# Patient Record
Sex: Female | Born: 1955 | Race: Black or African American | Hispanic: No | Smoking: Current every day smoker
Health system: Southern US, Community
[De-identification: ages and names within clinical notes are randomized; demographics above are authoritative.]

## PROBLEM LIST (undated history)

## (undated) DIAGNOSIS — R0609 Other forms of dyspnea: Secondary | ICD-10-CM

## (undated) DIAGNOSIS — H04129 Dry eye syndrome of unspecified lacrimal gland: Secondary | ICD-10-CM

## (undated) DIAGNOSIS — N183 Chronic kidney disease, stage 3 unspecified: Secondary | ICD-10-CM

## (undated) DIAGNOSIS — Z87448 Personal history of other diseases of urinary system: Secondary | ICD-10-CM

## (undated) DIAGNOSIS — F419 Anxiety disorder, unspecified: Secondary | ICD-10-CM

## (undated) DIAGNOSIS — M1990 Unspecified osteoarthritis, unspecified site: Secondary | ICD-10-CM

## (undated) DIAGNOSIS — Z972 Presence of dental prosthetic device (complete) (partial): Secondary | ICD-10-CM

## (undated) DIAGNOSIS — Z87440 Personal history of urinary (tract) infections: Secondary | ICD-10-CM

## (undated) DIAGNOSIS — F329 Major depressive disorder, single episode, unspecified: Secondary | ICD-10-CM

## (undated) DIAGNOSIS — N201 Calculus of ureter: Secondary | ICD-10-CM

## (undated) DIAGNOSIS — I10 Essential (primary) hypertension: Secondary | ICD-10-CM

## (undated) DIAGNOSIS — R3915 Urgency of urination: Secondary | ICD-10-CM

## (undated) DIAGNOSIS — Z8659 Personal history of other mental and behavioral disorders: Secondary | ICD-10-CM

## (undated) DIAGNOSIS — R0600 Dyspnea, unspecified: Secondary | ICD-10-CM

## (undated) DIAGNOSIS — H8110 Benign paroxysmal vertigo, unspecified ear: Secondary | ICD-10-CM

## (undated) DIAGNOSIS — K219 Gastro-esophageal reflux disease without esophagitis: Secondary | ICD-10-CM

## (undated) DIAGNOSIS — R319 Hematuria, unspecified: Secondary | ICD-10-CM

## (undated) DIAGNOSIS — G43909 Migraine, unspecified, not intractable, without status migrainosus: Secondary | ICD-10-CM

## (undated) DIAGNOSIS — Z87898 Personal history of other specified conditions: Secondary | ICD-10-CM

## (undated) DIAGNOSIS — L409 Psoriasis, unspecified: Secondary | ICD-10-CM

## (undated) DIAGNOSIS — R195 Other fecal abnormalities: Secondary | ICD-10-CM

## (undated) DIAGNOSIS — Z8669 Personal history of other diseases of the nervous system and sense organs: Secondary | ICD-10-CM

## (undated) DIAGNOSIS — N200 Calculus of kidney: Secondary | ICD-10-CM

## (undated) DIAGNOSIS — F32A Depression, unspecified: Secondary | ICD-10-CM

## (undated) DIAGNOSIS — J45909 Unspecified asthma, uncomplicated: Secondary | ICD-10-CM

---

## 1984-05-03 HISTORY — PX: EXPLORATORY LAPAROTOMY: SUR591

## 1997-10-21 ENCOUNTER — Emergency Department (HOSPITAL_COMMUNITY): Admission: EM | Admit: 1997-10-21 | Discharge: 1997-10-21 | Payer: Self-pay | Admitting: Internal Medicine

## 1999-11-02 ENCOUNTER — Encounter: Payer: Self-pay | Admitting: Cardiology

## 1999-11-02 ENCOUNTER — Ambulatory Visit (HOSPITAL_COMMUNITY): Admission: RE | Admit: 1999-11-02 | Discharge: 1999-11-02 | Payer: Self-pay | Admitting: Cardiology

## 1999-12-06 ENCOUNTER — Encounter: Payer: Self-pay | Admitting: Emergency Medicine

## 1999-12-06 ENCOUNTER — Emergency Department (HOSPITAL_COMMUNITY): Admission: EM | Admit: 1999-12-06 | Discharge: 1999-12-06 | Payer: Self-pay | Admitting: Emergency Medicine

## 1999-12-10 ENCOUNTER — Encounter: Payer: Self-pay | Admitting: Specialist

## 1999-12-10 ENCOUNTER — Ambulatory Visit (HOSPITAL_COMMUNITY): Admission: RE | Admit: 1999-12-10 | Discharge: 1999-12-10 | Payer: Self-pay | Admitting: Specialist

## 2000-05-17 ENCOUNTER — Emergency Department (HOSPITAL_COMMUNITY): Admission: EM | Admit: 2000-05-17 | Discharge: 2000-05-17 | Payer: Self-pay | Admitting: Emergency Medicine

## 2000-05-17 ENCOUNTER — Encounter: Payer: Self-pay | Admitting: Emergency Medicine

## 2000-06-23 ENCOUNTER — Ambulatory Visit (HOSPITAL_COMMUNITY): Admission: RE | Admit: 2000-06-23 | Discharge: 2000-06-23 | Payer: Self-pay | Admitting: *Deleted

## 2000-06-23 ENCOUNTER — Encounter: Payer: Self-pay | Admitting: *Deleted

## 2002-01-22 ENCOUNTER — Ambulatory Visit (HOSPITAL_BASED_OUTPATIENT_CLINIC_OR_DEPARTMENT_OTHER): Admission: RE | Admit: 2002-01-22 | Discharge: 2002-01-22 | Payer: Self-pay | Admitting: Specialist

## 2002-01-22 ENCOUNTER — Encounter (INDEPENDENT_AMBULATORY_CARE_PROVIDER_SITE_OTHER): Payer: Self-pay | Admitting: Specialist

## 2002-01-22 HISTORY — PX: OTHER SURGICAL HISTORY: SHX169

## 2002-02-19 ENCOUNTER — Emergency Department (HOSPITAL_COMMUNITY): Admission: EM | Admit: 2002-02-19 | Discharge: 2002-02-19 | Payer: Self-pay | Admitting: *Deleted

## 2002-11-11 ENCOUNTER — Emergency Department (HOSPITAL_COMMUNITY): Admission: EM | Admit: 2002-11-11 | Discharge: 2002-11-11 | Payer: Self-pay | Admitting: Emergency Medicine

## 2002-11-17 ENCOUNTER — Encounter: Payer: Self-pay | Admitting: Cardiology

## 2002-11-17 ENCOUNTER — Ambulatory Visit (HOSPITAL_COMMUNITY): Admission: RE | Admit: 2002-11-17 | Discharge: 2002-11-17 | Payer: Self-pay | Admitting: Cardiology

## 2002-11-27 ENCOUNTER — Ambulatory Visit (HOSPITAL_COMMUNITY): Admission: RE | Admit: 2002-11-27 | Discharge: 2002-11-27 | Payer: Self-pay | Admitting: Obstetrics

## 2002-11-27 ENCOUNTER — Encounter: Payer: Self-pay | Admitting: Obstetrics

## 2003-06-08 ENCOUNTER — Emergency Department (HOSPITAL_COMMUNITY): Admission: EM | Admit: 2003-06-08 | Discharge: 2003-06-08 | Payer: Self-pay | Admitting: Emergency Medicine

## 2004-09-17 ENCOUNTER — Emergency Department (HOSPITAL_COMMUNITY): Admission: EM | Admit: 2004-09-17 | Discharge: 2004-09-17 | Payer: Self-pay | Admitting: Emergency Medicine

## 2005-08-10 ENCOUNTER — Ambulatory Visit (HOSPITAL_BASED_OUTPATIENT_CLINIC_OR_DEPARTMENT_OTHER): Admission: RE | Admit: 2005-08-10 | Discharge: 2005-08-10 | Payer: Self-pay | Admitting: Cardiology

## 2005-08-10 HISTORY — PX: OTHER SURGICAL HISTORY: SHX169

## 2005-08-12 ENCOUNTER — Encounter: Admission: RE | Admit: 2005-08-12 | Discharge: 2005-08-12 | Payer: Self-pay | Admitting: Cardiology

## 2005-08-15 ENCOUNTER — Ambulatory Visit: Payer: Self-pay | Admitting: Internal Medicine

## 2005-09-13 ENCOUNTER — Encounter: Admission: RE | Admit: 2005-09-13 | Discharge: 2005-09-13 | Payer: Self-pay | Admitting: Cardiology

## 2006-11-15 ENCOUNTER — Encounter: Admission: RE | Admit: 2006-11-15 | Discharge: 2006-11-15 | Payer: Self-pay | Admitting: Cardiology

## 2007-07-25 ENCOUNTER — Emergency Department (HOSPITAL_COMMUNITY): Admission: EM | Admit: 2007-07-25 | Discharge: 2007-07-25 | Payer: Self-pay | Admitting: Emergency Medicine

## 2007-12-27 ENCOUNTER — Emergency Department (HOSPITAL_COMMUNITY): Admission: EM | Admit: 2007-12-27 | Discharge: 2007-12-27 | Payer: Self-pay | Admitting: Emergency Medicine

## 2008-01-26 ENCOUNTER — Ambulatory Visit (HOSPITAL_COMMUNITY): Admission: RE | Admit: 2008-01-26 | Discharge: 2008-01-26 | Payer: Self-pay | Admitting: Cardiology

## 2008-02-07 ENCOUNTER — Ambulatory Visit (HOSPITAL_COMMUNITY): Admission: RE | Admit: 2008-02-07 | Discharge: 2008-02-07 | Payer: Self-pay | Admitting: Cardiology

## 2008-08-30 ENCOUNTER — Emergency Department (HOSPITAL_COMMUNITY): Admission: EM | Admit: 2008-08-30 | Discharge: 2008-08-30 | Payer: Self-pay | Admitting: Emergency Medicine

## 2008-11-16 ENCOUNTER — Emergency Department (HOSPITAL_COMMUNITY): Admission: EM | Admit: 2008-11-16 | Discharge: 2008-11-16 | Payer: Self-pay | Admitting: Internal Medicine

## 2008-11-19 ENCOUNTER — Emergency Department (HOSPITAL_COMMUNITY): Admission: EM | Admit: 2008-11-19 | Discharge: 2008-11-19 | Payer: Self-pay | Admitting: Family Medicine

## 2009-05-20 ENCOUNTER — Emergency Department (HOSPITAL_COMMUNITY): Admission: EM | Admit: 2009-05-20 | Discharge: 2009-05-20 | Payer: Self-pay | Admitting: Emergency Medicine

## 2009-12-01 ENCOUNTER — Encounter: Admission: RE | Admit: 2009-12-01 | Discharge: 2009-12-01 | Payer: Self-pay | Admitting: Cardiology

## 2010-01-02 ENCOUNTER — Emergency Department (HOSPITAL_COMMUNITY): Admission: EM | Admit: 2010-01-02 | Discharge: 2010-01-02 | Payer: Self-pay | Admitting: Emergency Medicine

## 2010-05-24 ENCOUNTER — Encounter: Payer: Self-pay | Admitting: Cardiology

## 2010-05-24 ENCOUNTER — Encounter: Payer: Self-pay | Admitting: Obstetrics

## 2010-05-25 ENCOUNTER — Encounter: Payer: Self-pay | Admitting: Cardiology

## 2010-07-01 ENCOUNTER — Emergency Department (HOSPITAL_COMMUNITY)
Admission: EM | Admit: 2010-07-01 | Discharge: 2010-07-01 | Disposition: A | Discharge status: Home / Self Care | Payer: Medicare Other | Attending: Emergency Medicine | Admitting: Emergency Medicine

## 2010-07-01 DIAGNOSIS — L03317 Cellulitis of buttock: Secondary | ICD-10-CM | POA: Insufficient documentation

## 2010-07-01 DIAGNOSIS — L0231 Cutaneous abscess of buttock: Secondary | ICD-10-CM | POA: Insufficient documentation

## 2010-07-01 DIAGNOSIS — Z79899 Other long term (current) drug therapy: Secondary | ICD-10-CM | POA: Insufficient documentation

## 2010-07-01 HISTORY — PX: OTHER SURGICAL HISTORY: SHX169

## 2010-07-15 NOTE — Op Note (Signed)
  NAME:  Whitney Parrish, Whitney Parrish                ACCOUNT NO.:  [id number]  MEDICAL RECORD NO.:  [id number]           PATIENT TYPE:  E  LOCATION:  [facility]                         FACILITY:  [facility]  PHYSICIAN:  Angelia Mould. Derrell Lolling, M.D.DATE OF BIRTH:  04/14/1956  DATE OF PROCEDURE: DATE OF DISCHARGE:  07/01/2010                              OPERATIVE REPORT   PREOPERATIVE DIAGNOSIS:  Left buttock abscess x2.  POSTOPERATIVE DIAGNOSIS:  Left buttock abscess x2.  PROCEDURE:  Simple incision and drainage of left buttocks abscess x2.  SURGEON:  Brayton El, PA-C/Jenkins Risdon M. Derrell Lolling, MD  ANESTHESIA:  Local.  DESCRIPTION OF PROCEDURE:  After informed consent was adequate, the patient was prepped and draped in sterile manner lying in a prone position.  Xylocaine 2% without epinephrine was infiltrated into the abscess area until adequate anesthesia was achieved.  Then a #11 blade was used to make two small 1-cm incisions into the abscess cavities to allow drainage of purulent material.  Compression was used to aid in decompression of the abscess cavities.  Sterile hemostat was used to break up any septations.  The wounds were then packed with quarter-inch Iodoform gauze.  The patient tolerated the procedure well.  Dry sterile dressing was placed over the wounds.  The patient was given discharge instructions.     Brayton El, PA-C   ______________________________ Angelia Mould. Derrell Lolling, M.D.    KB/MEDQ  D:  07/01/2010  T:  07/02/2010  Job:  [id number]  Electronically Signed by Brayton El  on 07/14/2010 11:00:58 AM Electronically Signed by Claud Kelp M.D. on 07/15/2010 09:24:29 AM

## 2010-07-15 NOTE — Consult Note (Signed)
NAME:  Whitney Parrish, Whitney Parrish                ACCOUNT NO.:  [id number]  MEDICAL RECORD NO.:  [id number]           PATIENT TYPE:  E  LOCATION:  [facility]                         FACILITY:  [facility]  PHYSICIAN:  Angelia Mould. Derrell Lolling, M.D.DATE OF BIRTH:  04/14/1956  DATE OF CONSULTATION:  07/01/2010 DATE OF DISCHARGE:  07/01/2010                                CONSULTATION   REQUESTING PHYSICIAN:  Doug Sou, MD at the Emergency Department.  CHIEF COMPLAINT:  Boils on buttocks.  HISTORY OF PRESENT ILLNESS:  Whitney Parrish is a pleasant 55 year old African American female, who complains of painful boils on her bilateral buttocks for several days.  It has gotten to the point where she has difficulty sitting because of the discomfort and therefore came to the emergency department for evaluation.  She denies any fever.  She denies any trauma.  She does have a history of psoriasis on multiple medications including steroid for this.  She does not use any injectables to her buttocks areas.  We have been asked to evaluate the patient for incision and drainage.  PAST MEDICAL HISTORY:  Psoriasis, irritable bowel syndrome, migraines and vertigo.  FAMILY HISTORY:  Noncontributory at present case.  SOCIAL HISTORY:  The patient does smoke cigarettes.  She denies any tobacco or illicit drug use.  She is currently on disability for some type of bowel disorder.  DRUG ALLERGIES:  SULFA.  MEDICATIONS:  Meclizine, halobetasol, meloxicam and, clobetasol.  REVIEW OF SYSTEMS:  Please see history of present illness, otherwise complete 12 system review found negative.  PHYSICAL EXAMINATION:  VITAL SIGNS:  Temperature of 98.2, heart rate of 85, respiratory rate of 20, blood pressure 113/63, oxygen saturation 100% on room air. GENERAL APPEARANCE:  A 55 year old [place] American female, in no acute distress. HEENT:  Unremarkable. NECK:  Supple. LUNGS:  Clear to auscultation.  No wheezes, rhonchi, or rales. HEART:   Regular rate and rhythm.  No murmurs, gallops or rubs. ABDOMEN:  Soft, flat, benign and nontender.  No mass effect or hernias. SKIN:  Her buttocks show multiple small punctate areas that are several indurated areas that appear to be nearly coming to a head, but there is no fluctuance.  These are tender and mildly erythematous.  There is one lesion particularly in the right lower, right inferior buttocks area that is particularly tender, but again no fluctuance.  Along, the medial aspect of the left buttock, the larger area of duration with some skin excoriation and active drainage.  There are 2 separate fluctuations with overlying erythema and exquisite tenderness.  DIAGNOSTICS:  No labs or imaging is performed.  IMPRESSION:  Multiple buttock abscesses.  PLAN:  With recommendation and agreement of the patient, we recommend incision and drainage.  This was carried out.  Please see separate procedure note dictated.  The patient will be placed on doxycycline 100 mg twice daily for 2 weeks.  She is given a prescription for Percocet 1- 2 tablets q.6 h. p.r.n. pain.  She is encouraged to use warm soaks and warm compresses in approximately 24 hours.  She could sit and soak in the tub and allow the  packing strips to come out.  She needs to maintain a dry dressing over this area and otherwise use antimicrobial soap, expect the antibiotics to allow the additional small lesions to heal without need for incision and drainage.  She can certainly contact our office without any other questions or concerns if these areas are not improving over the next 7-10 days.     Brayton El, PA-C   ______________________________ Angelia Mould. Derrell Lolling, M.D.    KB/MEDQ  D:  07/01/2010  T:  07/02/2010  Job:  [id number]  Electronically Signed by Brayton El  on 07/14/2010 11:01:03 AM Electronically Signed by Claud Kelp M.D. on 07/15/2010 09:25:41 AM

## 2010-08-12 LAB — CBC
HCT: 36.7 % (ref 36.0–46.0)
Hemoglobin: 12.7 g/dL (ref 12.0–15.0)
MCHC: 34.7 g/dL (ref 30.0–36.0)
MCV: 85.2 fL (ref 78.0–100.0)
Platelets: 278 10*3/uL (ref 150–400)
RBC: 4.3 MIL/uL (ref 3.87–5.11)
RDW: 13.2 % (ref 11.5–15.5)
WBC: 14.7 10*3/uL — ABNORMAL HIGH (ref 4.0–10.5)

## 2010-08-12 LAB — DIFFERENTIAL
Basophils Absolute: 0.1 10*3/uL (ref 0.0–0.1)
Basophils Relative: 1 % (ref 0–1)
Eosinophils Absolute: 0.1 10*3/uL (ref 0.0–0.7)
Eosinophils Relative: 0 % (ref 0–5)
Lymphocytes Relative: 13 % (ref 12–46)
Lymphs Abs: 1.9 10*3/uL (ref 0.7–4.0)
Monocytes Absolute: 0.5 10*3/uL (ref 0.1–1.0)
Monocytes Relative: 3 % (ref 3–12)
Neutro Abs: 12.2 10*3/uL — ABNORMAL HIGH (ref 1.7–7.7)
Neutrophils Relative %: 83 % — ABNORMAL HIGH (ref 43–77)

## 2010-08-12 LAB — COMPREHENSIVE METABOLIC PANEL
ALT: 15 U/L (ref 0–35)
AST: 40 U/L — ABNORMAL HIGH (ref 0–37)
Albumin: 3.8 g/dL (ref 3.5–5.2)
Alkaline Phosphatase: 100 U/L (ref 39–117)
BUN: 12 mg/dL (ref 6–23)
CO2: 18 mEq/L — ABNORMAL LOW (ref 19–32)
Calcium: 9.1 mg/dL (ref 8.4–10.5)
Chloride: 111 mEq/L (ref 96–112)
Creatinine, Ser: 2.18 mg/dL — ABNORMAL HIGH (ref 0.4–1.2)
GFR calc Af Amer: 29 mL/min — ABNORMAL LOW (ref >60)
GFR calc non Af Amer: 24 mL/min — ABNORMAL LOW (ref >60)
Glucose, Bld: 112 mg/dL — ABNORMAL HIGH (ref 70–99)
Potassium: 5.6 mEq/L — ABNORMAL HIGH (ref 3.5–5.1)
Sodium: 139 mEq/L (ref 135–145)
Total Bilirubin: 1.4 mg/dL — ABNORMAL HIGH (ref 0.3–1.2)
Total Protein: 7.5 g/dL (ref 6.0–8.3)

## 2010-08-12 LAB — URINE CULTURE: Colony Count: 15000

## 2010-08-12 LAB — URINALYSIS, ROUTINE W REFLEX MICROSCOPIC
Bilirubin Urine: NEGATIVE
Glucose, UA: NEGATIVE mg/dL
Ketones, ur: NEGATIVE mg/dL
Nitrite: NEGATIVE
Protein, ur: 30 mg/dL — AB
Specific Gravity, Urine: 1.012 (ref 1.005–1.030)
Urobilinogen, UA: 0.2 mg/dL (ref 0.0–1.0)
pH: 6 (ref 5.0–8.0)

## 2010-08-12 LAB — POCT I-STAT, CHEM 8
BUN: 16 mg/dL (ref 6–23)
Calcium, Ion: 1.14 mmol/L (ref 1.12–1.32)
Chloride: 116 mEq/L — ABNORMAL HIGH (ref 96–112)
Creatinine, Ser: 2.4 mg/dL — ABNORMAL HIGH (ref 0.4–1.2)
Glucose, Bld: 100 mg/dL — ABNORMAL HIGH (ref 70–99)
HCT: 41 % (ref 36.0–46.0)
Hemoglobin: 13.9 g/dL (ref 12.0–15.0)
Potassium: 5.1 mEq/L (ref 3.5–5.1)
Sodium: 138 mEq/L (ref 135–145)
TCO2: 18 mmol/L (ref 0–100)

## 2010-08-12 LAB — URINE MICROSCOPIC-ADD ON

## 2010-08-12 LAB — LIPASE, BLOOD: Lipase: 50 U/L (ref 11–59)

## 2010-09-18 NOTE — Op Note (Signed)
   NAME:  Whitney Parrish, Whitney Parrish                          ACCOUNT NO.:  [id number]   MEDICAL RECORD NO.:  [id number]                   PATIENT TYPE:  AMB   LOCATION:  [facility]                                  FACILITY:  [facility]   PHYSICIAN:  Earvin Hansen L. Shon Hough, M.D.           DATE OF BIRTH:  05-Feb-1956   DATE OF PROCEDURE:  DATE OF DISCHARGE:                                 OPERATIVE REPORT   INDICATIONS:  This is a 55 year old lady with severe abdominal deformity  secondary to previous abdominal exploratory surgery and removal of tumor.  The patient demonstrates now with severe deformity involving the midline of  the abdomen and from the xiphoid process down to the suprasternal notch with  scar, hypertrophy and also deficits in the midportion.   PROCEDURE:  Excision of all deficits and scar and revision of the abdominal  wall.   ANESTHESIA:  General anesthesia intubation.   ASSISTANT:  Nurse.   PROCEDURE:  The patient was taken to the operating and placed on the  operating table in supine position. Was given adequate general anesthesia.  Intubated orally. Prep was done to the abdominal areas using Betadine, soap  and solution and walled off with sterile towels and drapes in order to make  a sterile field. Marking pen was used to outline the whole area of the scar  including the umbilicus which had been shifted way over to the right side  approximately 3-4 cm. Excision was made of the scar down to underlying  fascia. Hemostasis was maintained with Bovie electrocoagulation. Next, the  medial and lateral flaps were freed out approximately 8 cm and after proper  hemostasis the flaps were rotated together in the midline and secured with 2-  0 Monocryl x two layers and a running subcuticular suture of 3-0 Monocryl  readjusted the umbilicus back towards the midline as best as possible. The  wounds were cleansed. Steri-Strips with soft dressing was applied to all the  areas including silicone gel  patches, 4 x 4's, ABDs and Hypafix tape. She  withstood the procedure very well and was taken to recovery in excellent  condition.                                               Yaakov Guthrie. Shon Hough, M.D.    Cathie Hoops  D:  01/22/2002  T:  01/24/2002  Job:  [id number]

## 2010-09-18 NOTE — Procedures (Signed)
NAME:  Whitney Parrish, Whitney Parrish                ACCOUNT NO.:  [id number]   MEDICAL RECORD NO.:  [id number]          PATIENT TYPE:  OUT   LOCATION:  SLEEP CENTER                 [facility]ACILITY[facility]   PHYSICIAN:  Clinton D. Maple Hudson, M.D. DATE OF BIRTH:  Dec 27, 1955   DATE OF STUDY:  08/10/2005                              NOCTURNAL POLYSOMNOGRAM   REFERRING PHYSICIAN:  Dr. Osvaldo Shipper. Spruill.   INDICATIONS FOR STUDY:  Insomnia with sleep apnea.  Epworth sleepiness score  03/24, BMI 26.  Weight 146 pounds.   HOME MEDICATIONS:  Alprazolam, Lotronex, meclizine.   She complains that headache and irritable bowel syndrome with bathroom trips  disturb her sleep at home.   Sleep architecture:  Total sleep time 422 minutes with sleep efficiency 96%.  Stage I was 6%, stage II 75%, stages III and IV 5%, REM 14% of total sleep  time.  Sleep latency 17.5 minutes, REM latency 100 minutes, awake after  sleep onset 11 minutes, arousal index increased at 43.5 indicating increased  fragmentation.  The technician noted that the patient's cough was waking  her.  The patient smokes.   Respiratory data:  Apnea/hypopnea index (AHI, RDI) 0.3 obstructive events  per hour which is within normal limits (0 to 5 per hour).  This reflected  the presence of two hypopneas which were noted while she slept on her left  side.  REM AHI 0.   Oxygen data:  Mild snoring with oxygen desaturation to a nadir of 79%.  Mean  oxygen saturation through the study was 98-99% on room air.   Cardiac data:  Normal sinus rhythm.   Movement/parasomnia:  A total of 61 limb jerks of which 17 were associated  with arousal or awakening for periodic limb movement with arousal index of  2.4 per hour which is mildly increased.   IMPRESSION/RECOMMENDATIONS:  1.  Note unremarkable sleep time without medication for this study although      she describes headache and bathroom trips related to irritable bowel as      problems at home.  The technician  commented on cough disturbing sleep      under observation for this study.  2.  Insignificant sleep disordered breathing, AHI two per hour with mild to      moderate snoring and no specific      therapy indicated other than possibly for nasal congestion.  3.  Mild periodic limb movement with arousal, 2.4 per hour.      Clinton D. Maple Hudson, M.D.  Diplomate, Biomedical engineer of Sleep Medicine  Electronically Signed     CDY/MEDQ  D:  08/15/2005 14:38:44  T:  08/16/2005 10:01:34  Job:  [id number]

## 2011-01-25 LAB — URINALYSIS, ROUTINE W REFLEX MICROSCOPIC
Bilirubin Urine: NEGATIVE
Glucose, UA: NEGATIVE
Hgb urine dipstick: NEGATIVE
Ketones, ur: NEGATIVE
Nitrite: NEGATIVE
Protein, ur: 30 — AB
Specific Gravity, Urine: 1.012
Urobilinogen, UA: 0.2
pH: 6

## 2011-01-25 LAB — DIFFERENTIAL
Basophils Absolute: 0.1
Basophils Relative: 1
Eosinophils Absolute: 0.1
Eosinophils Relative: 1
Lymphocytes Relative: 36
Lymphs Abs: 3.9
Monocytes Absolute: 0.4
Monocytes Relative: 4
Neutro Abs: 6.5
Neutrophils Relative %: 59

## 2011-01-25 LAB — POCT I-STAT, CHEM 8
BUN: 10
Calcium, Ion: 1.15
Chloride: 116 — ABNORMAL HIGH
Creatinine, Ser: 1.8 — ABNORMAL HIGH
Glucose, Bld: 97
HCT: 42
Hemoglobin: 14.3
Potassium: 3.3 — ABNORMAL LOW
Sodium: 143
TCO2: 19

## 2011-01-25 LAB — CBC
HCT: 39.3
Hemoglobin: 13
MCHC: 33.2
MCV: 87.6
Platelets: 351
RBC: 4.48
RDW: 13.5
WBC: 11.1 — ABNORMAL HIGH

## 2011-01-25 LAB — URINE MICROSCOPIC-ADD ON

## 2011-05-17 ENCOUNTER — Other Ambulatory Visit: Payer: Self-pay | Admitting: Cardiovascular Disease

## 2011-05-17 ENCOUNTER — Ambulatory Visit
Admission: RE | Admit: 2011-05-17 | Discharge: 2011-05-17 | Disposition: A | Discharge status: Home / Self Care | Payer: Medicare Other | Source: Ambulatory Visit | Attending: Cardiovascular Disease | Admitting: Cardiovascular Disease

## 2011-06-25 ENCOUNTER — Ambulatory Visit
Admission: RE | Admit: 2011-06-25 | Discharge: 2011-06-25 | Disposition: A | Discharge status: Home / Self Care | Payer: Medicare Other | Source: Ambulatory Visit | Attending: Cardiovascular Disease | Admitting: Cardiovascular Disease

## 2011-06-25 ENCOUNTER — Other Ambulatory Visit: Payer: Self-pay | Admitting: Cardiovascular Disease

## 2011-06-25 DIAGNOSIS — M549 Dorsalgia, unspecified: Secondary | ICD-10-CM

## 2011-10-05 ENCOUNTER — Ambulatory Visit
Admission: RE | Admit: 2011-10-05 | Discharge: 2011-10-05 | Disposition: A | Discharge status: Home / Self Care | Payer: Medicare Other | Source: Ambulatory Visit | Attending: Cardiovascular Disease | Admitting: Cardiovascular Disease

## 2011-10-05 ENCOUNTER — Other Ambulatory Visit: Payer: Self-pay | Admitting: Diagnostic Radiology

## 2011-10-05 ENCOUNTER — Other Ambulatory Visit: Payer: Self-pay | Admitting: Cardiovascular Disease

## 2011-10-05 DIAGNOSIS — N644 Mastodynia: Secondary | ICD-10-CM

## 2011-10-05 DIAGNOSIS — R234 Changes in skin texture: Secondary | ICD-10-CM

## 2011-10-05 DIAGNOSIS — N63 Unspecified lump in unspecified breast: Secondary | ICD-10-CM

## 2011-10-06 ENCOUNTER — Encounter (INDEPENDENT_AMBULATORY_CARE_PROVIDER_SITE_OTHER): Payer: Self-pay | Admitting: Surgery

## 2011-10-06 ENCOUNTER — Ambulatory Visit (INDEPENDENT_AMBULATORY_CARE_PROVIDER_SITE_OTHER): Payer: Medicare Other | Admitting: Surgery

## 2011-10-06 VITALS — BP 120/82 | HR 90 | Temp 97.4°F | Resp 18 | Ht 62.0 in | Wt 147.4 lb

## 2011-10-06 DIAGNOSIS — N611 Abscess of the breast and nipple: Secondary | ICD-10-CM

## 2011-10-06 DIAGNOSIS — N61 Mastitis without abscess: Secondary | ICD-10-CM

## 2011-10-06 NOTE — Progress Notes (Signed)
Subjective:     Patient ID: Whitney Parrish, female   DOB: [birth year], 56 y.o.   MRN: [id number]  HPI This patient is referred here by the breast center for evaluation of an abscess on her left breast. She reports it has been approximately 3 weeks. It started spontaneously draining last night.  Review of Systems     Objective:   Physical Exam On exam, she has a superficial abscess at the 10:00 position of the left breast. After discussing this with her, I prepped the area Betadine, anesthetized with lidocaine, made an incision with a scalpel. I then draining purulence from the abscess. I then packed it with gauze. This appeared consistent with a sebaceous cyst. She had ultrasound and mammograms done as well which were also consistent with potential for sebaceous cysts. There was no evidence of malignancy    Assessment:     Left breast abscess    Plan:     Wound care instructions were given. She will change the packing starting tomorrow. I encouraged her to go ahead and start Keflex. I will have her see someone in the urgent office next Monday or Tuesday

## 2011-10-11 ENCOUNTER — Encounter (INDEPENDENT_AMBULATORY_CARE_PROVIDER_SITE_OTHER): Payer: Self-pay | Admitting: General Surgery

## 2011-10-11 ENCOUNTER — Ambulatory Visit (INDEPENDENT_AMBULATORY_CARE_PROVIDER_SITE_OTHER): Payer: Medicare Other | Admitting: General Surgery

## 2011-10-11 VITALS — BP 130/82 | HR 78 | Resp 16 | Ht 62.0 in | Wt 149.0 lb

## 2011-10-11 DIAGNOSIS — Z09 Encounter for follow-up examination after completed treatment for conditions other than malignant neoplasm: Secondary | ICD-10-CM

## 2011-10-11 NOTE — Progress Notes (Signed)
Subjective:     Patient ID: Artist Pais, female   DOB: July 09, 1955, 56 y.o.   MRN: [id number]  HPI This is a 56 yof who has a left breast sebaceous cyst that was drained on Friday by Dr. Magnus Ivan.  The packing fell out and has not repacked. She reports this is still tender but is otherwise better and has no more real drainage.  Review of Systems     Objective:   Physical Exam Left breast sebaceous cyst with open wound not really able to be packed, no erythema    Assessment:     Left breast sebaceous cyst s/p i and d    Plan:     I cant repack this today.  She will continue keflex and let this heal.  This will likely need to be excised to prevent further infections and she will return to see Dr. Magnus Ivan

## 2011-10-18 ENCOUNTER — Encounter (INDEPENDENT_AMBULATORY_CARE_PROVIDER_SITE_OTHER): Payer: Self-pay | Admitting: Surgery

## 2011-10-18 ENCOUNTER — Ambulatory Visit (INDEPENDENT_AMBULATORY_CARE_PROVIDER_SITE_OTHER): Payer: Medicare Other | Admitting: Surgery

## 2011-10-18 VITALS — BP 122/80 | HR 82 | Temp 98.0°F | Resp 14 | Ht 62.0 in | Wt 146.6 lb

## 2011-10-18 DIAGNOSIS — Z09 Encounter for follow-up examination after completed treatment for conditions other than malignant neoplasm: Secondary | ICD-10-CM

## 2011-10-18 NOTE — Progress Notes (Signed)
Subjective:     Patient ID: Whitney Parrish, female   DOB: 20-Dec-1955, 56 y.o.   MRN: [id number]  HPI She is here for a followup visit status post incision and drainage of infected sebaceous cyst on the left breast. She is doing well and has no complaints.  Review of Systems     Objective:   Physical Exam    The incision is healing well. Assessment:     Patient status post incision and drainage of infected sebaceous cyst of left chest    Plan:     I will see her back in one month to discuss formal excision of this area

## 2011-11-15 ENCOUNTER — Inpatient Hospital Stay (HOSPITAL_COMMUNITY)
Admission: AD | Admit: 2011-11-15 | Discharge: 2011-11-17 | DRG: 149 | Disposition: A | Discharge status: Home / Self Care | Payer: Medicare Other | Source: Ambulatory Visit | Attending: Cardiovascular Disease | Admitting: Cardiovascular Disease

## 2011-11-15 ENCOUNTER — Inpatient Hospital Stay (HOSPITAL_COMMUNITY): Payer: Medicare Other

## 2011-11-15 ENCOUNTER — Encounter (HOSPITAL_COMMUNITY): Payer: Self-pay | Admitting: General Practice

## 2011-11-15 DIAGNOSIS — R1031 Right lower quadrant pain: Secondary | ICD-10-CM | POA: Diagnosis present

## 2011-11-15 DIAGNOSIS — K589 Irritable bowel syndrome without diarrhea: Secondary | ICD-10-CM | POA: Diagnosis present

## 2011-11-15 DIAGNOSIS — R0789 Other chest pain: Secondary | ICD-10-CM | POA: Diagnosis present

## 2011-11-15 DIAGNOSIS — F410 Panic disorder [episodic paroxysmal anxiety] without agoraphobia: Secondary | ICD-10-CM | POA: Diagnosis present

## 2011-11-15 DIAGNOSIS — E860 Dehydration: Secondary | ICD-10-CM | POA: Diagnosis present

## 2011-11-15 DIAGNOSIS — R079 Chest pain, unspecified: Secondary | ICD-10-CM | POA: Diagnosis present

## 2011-11-15 DIAGNOSIS — G450 Vertebro-basilar artery syndrome: Secondary | ICD-10-CM | POA: Diagnosis present

## 2011-11-15 DIAGNOSIS — N182 Chronic kidney disease, stage 2 (mild): Secondary | ICD-10-CM | POA: Diagnosis present

## 2011-11-15 DIAGNOSIS — Z79899 Other long term (current) drug therapy: Secondary | ICD-10-CM

## 2011-11-15 DIAGNOSIS — Z23 Encounter for immunization: Secondary | ICD-10-CM

## 2011-11-15 DIAGNOSIS — R42 Dizziness and giddiness: Principal | ICD-10-CM | POA: Diagnosis present

## 2011-11-15 DIAGNOSIS — Z882 Allergy status to sulfonamides status: Secondary | ICD-10-CM

## 2011-11-15 HISTORY — DX: Major depressive disorder, single episode, unspecified: F32.9

## 2011-11-15 HISTORY — DX: Unspecified osteoarthritis, unspecified site: M19.90

## 2011-11-15 HISTORY — DX: Depression, unspecified: F32.A

## 2011-11-15 HISTORY — DX: Anxiety disorder, unspecified: F41.9

## 2011-11-15 LAB — COMPREHENSIVE METABOLIC PANEL
ALT: 18 U/L (ref 0–35)
AST: 18 U/L (ref 0–37)
Albumin: 4.4 g/dL (ref 3.5–5.2)
Alkaline Phosphatase: 111 U/L (ref 39–117)
BUN: 12 mg/dL (ref 6–23)
CO2: 16 mEq/L — ABNORMAL LOW (ref 19–32)
Calcium: 9.9 mg/dL (ref 8.4–10.5)
Chloride: 105 mEq/L (ref 96–112)
Creatinine, Ser: 1.97 mg/dL — ABNORMAL HIGH (ref 0.50–1.10)
GFR calc Af Amer: 32 mL/min — ABNORMAL LOW (ref >90)
GFR calc non Af Amer: 27 mL/min — ABNORMAL LOW (ref >90)
Glucose, Bld: 91 mg/dL (ref 70–99)
Potassium: 3.1 mEq/L — ABNORMAL LOW (ref 3.5–5.1)
Sodium: 141 mEq/L (ref 135–145)
Total Bilirubin: 0.4 mg/dL (ref 0.3–1.2)
Total Protein: 8.4 g/dL — ABNORMAL HIGH (ref 6.0–8.3)

## 2011-11-15 LAB — CARDIAC PANEL(CRET KIN+CKTOT+MB+TROPI)
CK, MB: 3.7 ng/mL (ref 0.3–4.0)
CK, MB: 7.2 ng/mL (ref 0.3–4.0)
Relative Index: 1.9 (ref 0.0–2.5)
Relative Index: 2.3 (ref 0.0–2.5)
Total CK: 194 U/L — ABNORMAL HIGH (ref 7–177)
Total CK: 310 U/L — ABNORMAL HIGH (ref 7–177)
Troponin I: <0.3 ng/mL (ref <0.30)
Troponin I: <0.3 ng/mL (ref <0.30)

## 2011-11-15 LAB — LIPASE, BLOOD: Lipase: 42 U/L (ref 11–59)

## 2011-11-15 LAB — DIFFERENTIAL
Basophils Absolute: 0 10*3/uL (ref 0.0–0.1)
Basophils Relative: 0 % (ref 0–1)
Eosinophils Absolute: 0.1 10*3/uL (ref 0.0–0.7)
Eosinophils Relative: 1 % (ref 0–5)
Lymphocytes Relative: 48 % — ABNORMAL HIGH (ref 12–46)
Lymphs Abs: 4.4 10*3/uL — ABNORMAL HIGH (ref 0.7–4.0)
Monocytes Absolute: 0.6 10*3/uL (ref 0.1–1.0)
Monocytes Relative: 6 % (ref 3–12)
Neutro Abs: 4.1 10*3/uL (ref 1.7–7.7)
Neutrophils Relative %: 45 % (ref 43–77)

## 2011-11-15 LAB — CBC
HCT: 45.3 % (ref 36.0–46.0)
Hemoglobin: 15.1 g/dL — ABNORMAL HIGH (ref 12.0–15.0)
MCH: 28.8 pg (ref 26.0–34.0)
MCHC: 33.3 g/dL (ref 30.0–36.0)
MCV: 86.5 fL (ref 78.0–100.0)
Platelets: 286 10*3/uL (ref 150–400)
RBC: 5.24 MIL/uL — ABNORMAL HIGH (ref 3.87–5.11)
RDW: 13.7 % (ref 11.5–15.5)
WBC: 9.1 10*3/uL (ref 4.0–10.5)

## 2011-11-15 LAB — TSH: TSH: 0.953 u[IU]/mL (ref 0.350–4.500)

## 2011-11-15 MED ORDER — POTASSIUM CHLORIDE CRYS ER 20 MEQ PO TBCR
40.0000 meq | EXTENDED_RELEASE_TABLET | ORAL | Status: AC
  Administered 2011-11-15 – 2011-11-16 (×3): 40 meq via ORAL
  Filled 2011-11-15 (×3): qty 2

## 2011-11-15 MED ORDER — SODIUM CHLORIDE 0.9 % IV SOLN: 250.0000 mL | INTRAVENOUS | Status: DC | PRN

## 2011-11-15 MED ORDER — SODIUM CHLORIDE 0.9 % IV SOLN
INTRAVENOUS | Status: DC
  Administered 2011-11-15 – 2011-11-17 (×3): via INTRAVENOUS

## 2011-11-15 MED ORDER — PNEUMOCOCCAL VAC POLYVALENT 25 MCG/0.5ML IJ INJ
0.5000 mL | INJECTION | Freq: Once | INTRAMUSCULAR | Status: DC
  Filled 2011-11-15: qty 0.5

## 2011-11-15 MED ORDER — HEPARIN SODIUM (PORCINE) 5000 UNIT/ML IJ SOLN
5000.0000 [IU] | Freq: Three times a day (TID) | INTRAMUSCULAR | Status: DC
  Administered 2011-11-15 – 2011-11-17 (×4): 5000 [IU] via SUBCUTANEOUS
  Filled 2011-11-15 (×10): qty 1

## 2011-11-15 MED ORDER — SODIUM CHLORIDE 0.9 % IJ SOLN: 3.0000 mL | INTRAMUSCULAR | Status: DC | PRN

## 2011-11-15 MED ORDER — HYDROCODONE-ACETAMINOPHEN 5-325 MG PO TABS
1.0000 | ORAL_TABLET | ORAL | Status: DC | PRN
  Administered 2011-11-15: 1 via ORAL
  Administered 2011-11-16 – 2011-11-17 (×3): 2 via ORAL
  Filled 2011-11-15: qty 2
  Filled 2011-11-15: qty 1
  Filled 2011-11-15 (×2): qty 2

## 2011-11-15 MED ORDER — SODIUM CHLORIDE 0.9 % IJ SOLN: 3.0000 mL | Freq: Two times a day (BID) | INTRAMUSCULAR | Status: DC

## 2011-11-15 NOTE — H&P (Signed)
Whitney Parrish is an 56 y.o. female.   Chief Complaint: Dizziness, abdominal pain and right sided chest pain. HPI: 56 yrs old female with chronic dizziness has right sided chest pain and RLQ abdominal pain with nausea but no fever. Recent surgery for left breast abscess I & D on 10/21/2011.   Past Medical History  Diagnosis Date  . Breast abscess 10/2011    left breast      Past Surgical History  Procedure Date  . Left breast abscess i&d 10/2011    Family History  Problem Relation Age of Onset  . Cancer Mother     Stomach  . Cancer Father     Unknown   Social History:  reports that she has been smoking Cigarettes.  She has never used smokeless tobacco. She reports that she does not drink alcohol or use illicit drugs.  Allergies:  Allergies  Allergen Reactions  . Sulfa Antibiotics     Makes patient see things    Medications Prior to Admission  Medication Sig Dispense Refill  . Adalimumab (HUMIRA [place]) Inject 40 mg into the skin.      Marland Kitchen ALPRAZolam (XANAX) 0.25 MG tablet Take 0.25 mg by mouth daily.      Marland Kitchen dicyclomine (BENTYL) 10 MG capsule Take 10 mg by mouth daily.      Marland Kitchen HYDROcodone-acetaminophen (NORCO) 5-325 MG per tablet Take 1 tablet by mouth every 6 (six) hours as needed.      . meclizine (ANTIVERT) 25 MG tablet Take 25 mg by mouth 3 (three) times daily.      . meloxicam (MOBIC) 15 MG tablet Take 15 mg by mouth daily.        No results found for this or any previous visit (from the past 48 hour(s)). No results found.  @ROS @ + weight loss, Wears reading glasses, no hearing loss, Wears partial upper dentures, +chest pain, + nausea, + Abd. Pain, + Bell's palsy, + fibromyalgia, + Psoriasis. No hepatitis, No kidney stone, No gastrointestinal bleed, no hepatitis, No stroke.   Blood pressure 125/85, pulse 108, temperature 98.4 F (36.9 C), temperature source Oral, resp. rate 17, height 5\' 2"  (1.575 m), weight 62.596 kg (138 lb). General appearance: alert, cooperative and  appears stated age HENT: Normocephalic, without obvious abnormality, atraumatic. Brown eyes, conjunctivae/corneas clear. PERRL, EOM's intact. Fundi benign. Throat: lips, mucosa, and tongue normal; teeth and gums normal Neck: no adenopathy, no carotid bruit, no JVD, supple, symmetrical, trachea midline and thyroid not enlarged, symmetric. Resp: clear to auscultation bilaterally Cardio: S1, S2 normal. II/VI systolic murmur. GI: soft, mild RLQ tenderness; bowel sounds normal; no masses,  no organomegaly Extremities: extremities normal, atraumatic, no cyanosis or edema Skin: Skin color, texture, turgor normal.  Neurologic: Alert and oriented X 3, normal strength and tone. Normal symmetric reflexes. Normal coordination and gait  Assessment/Plan Dizziness  R/O cardiac dysrhythmia Abdominal pain Chest pain  Telemetry CK, MB, TI MRI/MRA head   Mikhai Bienvenue S 11/15/2011, 2:27 PM

## 2011-11-16 LAB — CBC
HCT: 41.9 % (ref 36.0–46.0)
Hemoglobin: 13.8 g/dL (ref 12.0–15.0)
MCH: 28.5 pg (ref 26.0–34.0)
MCHC: 32.9 g/dL (ref 30.0–36.0)
MCV: 86.6 fL (ref 78.0–100.0)
Platelets: 242 10*3/uL (ref 150–400)
RBC: 4.84 MIL/uL (ref 3.87–5.11)
RDW: 13.7 % (ref 11.5–15.5)
WBC: 8 10*3/uL (ref 4.0–10.5)

## 2011-11-16 LAB — CARDIAC PANEL(CRET KIN+CKTOT+MB+TROPI)
CK, MB: 8 ng/mL (ref 0.3–4.0)
Relative Index: 2.2 (ref 0.0–2.5)
Total CK: 364 U/L — ABNORMAL HIGH (ref 7–177)
Troponin I: <0.3 ng/mL (ref <0.30)

## 2011-11-16 LAB — URINALYSIS, ROUTINE W REFLEX MICROSCOPIC
Bilirubin Urine: NEGATIVE
Glucose, UA: NEGATIVE mg/dL
Ketones, ur: NEGATIVE mg/dL
Nitrite: NEGATIVE
Protein, ur: 30 mg/dL — AB
Specific Gravity, Urine: 1.014 (ref 1.005–1.030)
Urobilinogen, UA: 0.2 mg/dL (ref 0.0–1.0)
pH: 6 (ref 5.0–8.0)

## 2011-11-16 LAB — BASIC METABOLIC PANEL
BUN: 15 mg/dL (ref 6–23)
CO2: 19 mEq/L (ref 19–32)
Calcium: 9 mg/dL (ref 8.4–10.5)
Chloride: 110 mEq/L (ref 96–112)
Creatinine, Ser: 1.75 mg/dL — ABNORMAL HIGH (ref 0.50–1.10)
GFR calc Af Amer: 36 mL/min — ABNORMAL LOW (ref >90)
GFR calc non Af Amer: 31 mL/min — ABNORMAL LOW (ref >90)
Glucose, Bld: 91 mg/dL (ref 70–99)
Potassium: 3.8 mEq/L (ref 3.5–5.1)
Sodium: 140 mEq/L (ref 135–145)

## 2011-11-16 LAB — URINE MICROSCOPIC-ADD ON

## 2011-11-16 NOTE — Progress Notes (Signed)
Subjective:  Occasional dizziness and panic attacks. Stable MRI/MRA brain and echocardiogram. Renal function improving with hydration.  Objective:  Vital Signs in the last 24 hours: Temp:  [98 F (36.7 C)-98.1 F (36.7 C)] 98.1 F (36.7 C) (07/16 1400) Pulse Rate:  [75-78] 75  (07/16 1400) Cardiac Rhythm:  [-] Normal sinus rhythm (07/16 0810) Resp:  [18] 18  (07/16 1400) BP: (108-115)/(72-75) 115/75 mmHg (07/16 1400) SpO2:  [100 %] 100 % (07/16 1400) Weight:  [64.411 kg (142 lb)] 64.411 kg (142 lb) (07/16 0500)  Physical Exam: BP Readings from Last 1 Encounters:  11/16/11 115/75    Wt Readings from Last 1 Encounters:  11/16/11 64.411 kg (142 lb)    Weight change:   HEENT: [place]/AT, Eyes-Brown, PERL, EOMI, Conjunctiva-Pink, Sclera-Non-icteric Neck: No JVD, No bruit, Trachea midline. Lungs:  Clear, Bilateral. Cardiac:  Regular rhythm, normal S1 and S2, no S3.  Abdomen:  Soft, non-tender. Extremities:  No edema present. No cyanosis. No clubbing. CNS: AxOx3, Cranial nerves grossly intact, moves all 4 extremities. Right handed. Skin: Warm and dry.   Intake/Output from previous day:      Lab Results: BMET    Component Value Date/Time   NA 140 11/16/2011 0650   K 3.8 11/16/2011 0650   CL 110 11/16/2011 0650   CO2 19 11/16/2011 0650   GLUCOSE 91 11/16/2011 0650   BUN 15 11/16/2011 0650   CREATININE 1.75* 11/16/2011 0650   CALCIUM 9.0 11/16/2011 0650   GFRNONAA 31* 11/16/2011 0650   GFRAA 36* 11/16/2011 0650   CBC    Component Value Date/Time   WBC 8.0 11/16/2011 0650   RBC 4.84 11/16/2011 0650   HGB 13.8 11/16/2011 0650   HCT 41.9 11/16/2011 0650   PLT 242 11/16/2011 0650   MCV 86.6 11/16/2011 0650   MCH 28.5 11/16/2011 0650   MCHC 32.9 11/16/2011 0650   RDW 13.7 11/16/2011 0650   LYMPHSABS 4.4* 11/15/2011 1436   MONOABS 0.6 11/15/2011 1436   EOSABS 0.1 11/15/2011 1436   BASOSABS 0.0 11/15/2011 1436   CARDIAC ENZYMES Lab Results  Component Value Date   CKTOTAL 364* 11/16/2011   CKMB 8.0* 11/16/2011   TROPONINI <0.30 11/16/2011    Assessment/Plan:  Patient Active Hospital Problem List: Dizziness (11/15/2011) secondary to dehydration and anxiety.   Abdominal pain, acute, right lower quadrant (11/15/2011)  Resolving Chest pain at rest (11/15/2011)   Resolving, probably non-cardiac Myalgia  Abnormal total CK. Chronic Kidney disease, II  Mild improvement with hydration  Good LV systolic function on Echocardiogram Increase ambulation home soon with cane or walker use   LOS: 1 day    [name]  MD  11/16/2011, 5:39 PM

## 2011-11-16 NOTE — Progress Notes (Signed)
INITIAL ADULT NUTRITION ASSESSMENT Date: 11/16/2011   Time: 1:18 PM  INTERVENTION:  Snacks at 10 am & 2 pm --- per patient request RD to follow for nutrition care plan  Reason for Assessment: Nutrition Risk Report  ASSESSMENT: Female 56 y.o.  Dx: Dizziness  Hx:  Past Medical History  Diagnosis Date  . Breast abscess 10/2011    left breast  . Chest pain   . Depression   . Shortness of breath   . Neuromuscular disorder     bells palsy  . Headache   . Arthritis   . Anxiety     Related Meds:     . heparin  5,000 Units Subcutaneous Q8H  . pneumococcal 23 valent vaccine  0.5 mL Intramuscular Once  . potassium chloride  40 mEq Oral Q2H  . sodium chloride  3 mL Intravenous Q12H  . sodium chloride  3 mL Intravenous Q12H    Ht: 5\' 2"  (157.5 cm)  Wt: 142 lb (64.411 kg)  Ideal Wt: 50 kg % Ideal Wt: 128%  Usual Wt: 147 lb -- per office visit record 10/06/11 % Usual Wt: 104%  Body mass index is 25.97 kg/(m^2).  Food/Nutrition Related Hx: unintentional weight loss > 10 lbs within the last month per admission nutrition screen  Labs:  CMP     Component Value Date/Time   NA 140 11/16/2011 0650   K 3.8 11/16/2011 0650   CL 110 11/16/2011 0650   CO2 19 11/16/2011 0650   GLUCOSE 91 11/16/2011 0650   BUN 15 11/16/2011 0650   CREATININE 1.75* 11/16/2011 0650   CALCIUM 9.0 11/16/2011 0650   PROT 8.4* 11/15/2011 1436   ALBUMIN 4.4 11/15/2011 1436   AST 18 11/15/2011 1436   ALT 18 11/15/2011 1436   ALKPHOS 111 11/15/2011 1436   BILITOT 0.4 11/15/2011 1436   GFRNONAA 31* 11/16/2011 0650   GFRAA 36* 11/16/2011 0650    Diet Order: Cardiac  Supplements/Tube Feeding: N/A  IVF:    sodium chloride Last Rate: 100 mL/hr at 11/15/11 2037    Estimated Nutritional Needs:   Kcal: 1600-1800 Protein: 75-85 gm Fluid: 1.6-1.8 L  Patient admitted for dizziness, abdominal pain and R sided chest pain; reports her appetite PTA has been rather poor; noted recent surgery for L breast abscess I  & D on 10/21/11; reports she's lost weight, however, was not able to quantify amount; per office visit records she's lost 5 lbs since June (not significant for time frame); states her appetite is good at this time; at breakfast well this AM; would like a snack between breakfast and lunch & lunch and dinner; preferences obtained -- RD to order.  NUTRITION DIAGNOSIS: No nutrition diagnosis at this time  RELATED TO: ---  AS EVIDENCE BY: ---  MONITORING/EVALUATION(Goals): Goal: Oral intake with meals & snacks to meet >/= 90% of estimated nutrition needs Monitor: PO intake, weight, labs, I/O's  EDUCATION NEEDS: -No education needs identified at this time  Dietitian #: [phone]  DOCUMENTATION CODES Per approved criteria  -Not Applicable    Alger Memos 11/16/2011, 1:18 PM

## 2011-11-16 NOTE — Clinical Documentation Improvement (Signed)
Abnormal Labs Clarification  THIS DOCUMENT IS NOT A PERMANENT PART OF THE MEDICAL RECORD  TO RESPOND TO THE THIS QUERY, FOLLOW THE INSTRUCTIONS BELOW:  1. If needed, update documentation for the patient's encounter via the notes activity.  2. Access this query again and click edit on the [employer].  3. After updating, or not, click F2 to complete all highlighted (required) fields concerning your review. Select "additional documentation in the medical record" OR "no additional documentation provided".  4. Click Sign note button.  5. The deficiency will fall out of your InBasket *Please let us know if you are not able to complete this workflow by phone or e-mail (listed below).  Please update your documentation within the medical record to reflect your response to this query.                                                                                   11/16/11  Dear Dr.  Algie Coffer  Marton Redwood  In a better effort to capture your patient's severity of illness/SOI, reflect appropriate length of stay and utilization of resources, a review of the medical record has revealed the following indicators.    Abnormal findings (laboratory, x-ray, pathologic, and other diagnostic results) are not coded and reported unless the physician indicates their clinical significance.   THANKS FOR RESPONDING TO THIS QUERY. Possible Clinical Conditions?                                  - Please address abnormal labs in Notes and DC Summary  Supporting Information: - Creatinine: 7/15: 1.97, 7/16: 1.75 - GFR: 7/15: 27/32, 7/16: 31/36 - Treatment: Monitoring renal function, NS IV 145ml/hr x24hr, daily weights  Reviewed: additional documentation in the medical record   Thank You,  Beverley Fiedler RN Clinical Documentation Specialist: Pager- [phone] Health Information Management: [phone] [facility]

## 2011-11-17 LAB — BASIC METABOLIC PANEL
BUN: 15 mg/dL (ref 6–23)
CO2: 19 mEq/L (ref 19–32)
Calcium: 8.9 mg/dL (ref 8.4–10.5)
Chloride: 111 mEq/L (ref 96–112)
Creatinine, Ser: 1.74 mg/dL — ABNORMAL HIGH (ref 0.50–1.10)
GFR calc Af Amer: 37 mL/min — ABNORMAL LOW (ref >90)
GFR calc non Af Amer: 32 mL/min — ABNORMAL LOW (ref >90)
Glucose, Bld: 105 mg/dL — ABNORMAL HIGH (ref 70–99)
Potassium: 4 mEq/L (ref 3.5–5.1)
Sodium: 142 mEq/L (ref 135–145)

## 2011-11-17 LAB — CBC
HCT: 38.4 % (ref 36.0–46.0)
Hemoglobin: 12.6 g/dL (ref 12.0–15.0)
MCH: 28.7 pg (ref 26.0–34.0)
MCHC: 32.8 g/dL (ref 30.0–36.0)
MCV: 87.5 fL (ref 78.0–100.0)
Platelets: 249 10*3/uL (ref 150–400)
RBC: 4.39 MIL/uL (ref 3.87–5.11)
RDW: 13.7 % (ref 11.5–15.5)
WBC: 7.9 10*3/uL (ref 4.0–10.5)

## 2011-11-17 NOTE — Discharge Summary (Signed)
Physician Discharge Summary  Patient ID: Whitney Parrish MRN: [id number] DOB/AGE: Sep 19, 1955 56 y.o.  Admit date: 11/15/2011 Discharge date: 11/17/2011  Admission Diagnoses: Dizziness (11/15/2011) secondary to dehydration and anxiety.  Abdominal pain, acute, right lower quadrant (11/15/2011) Chest pain at rest (11/15/2011) Chronic Kidney disease, II  Discharge Diagnoses:  Principal Problem: *Dizziness (11/15/2011) secondary to dehydration, anxiety and vertebrobasilar insufficiency.*  Positional vertigo Panic attacks Abdominal pain, from irritable bowel syndrome, resolved Chest pain probably non-cardiac  Myalgia with abnormal total CK.  Chronic Kidney disease, II   Discharged Condition: good  Hospital Course: 56 years old female with CKD, panic attacks and IBS has right sided chest pain and RLQ abdominal pain with nausea. Her abdominal pain resolved without any treatment. Her MRI/MRA brain, telemetry and echocardiogram were unremarkable. Her renal function improved partially with hydration. She had significant positional vertigo and positive Romberg's sign. She was advised to use cane or walker all the time along with antivert use for dizziness. She has chronic myalgia with abnormal total CK and normal troponin I. She was discharged home in satisfactory condition with follow up by me in 1 week.  Consults: None  Significant Diagnostic Studies: labs: Normal CBC and electrolytes and troponin I.  Elevated Cr of 1.97 improving to 1.74. Elevated total CK to 364. Radiology: CXR: normal and MRI: No acute intracranial abnormality. Minimal chronic white matter changes and MRA: Negative.    Treatments: IV hydration  Discharge Exam: Blood pressure 117/77, pulse 73, temperature 98 F (36.7 C), temperature source Oral, resp. rate 18, height 5\' 2"  (1.575 m), weight 67.223 kg (148 lb 3.2 oz), SpO2 99.00%. Physical exam: HEENT: [place]/AT, Eyes-Brown, PERL, EOMI, Conjunctiva-Pink, Sclera-Non-icteric    Neck: No JVD, No bruit, Trachea midline.  Lungs: Clear, Bilateral.  Cardiac: Regular rhythm, normal S1 and S2, no S3.  Abdomen: Soft, non-tender.  Extremities: No edema present. No cyanosis. No clubbing.  CNS: AxOx3, Cranial nerves grossly intact, moves all 4 extremities. Right handed.  Skin: Warm and dry.   Disposition: 01-Home or Self Care  Discharge Orders    Future Appointments: Provider: Department: Dept Phone: Center:   11/25/2011 2:10 PM Shelly Rubenstein, MD [facility] [phone] None     Medication List  As of 11/17/2011  9:41 AM   TAKE these medications         ALPRAZolam 0.25 MG tablet   Commonly known as: XANAX   Take 0.25 mg by mouth daily.      BENTYL 10 MG capsule   Generic drug: dicyclomine   Take 10 mg by mouth daily.      HUMIRA [place]   Inject 40 mg into the skin.      HYDROcodone-acetaminophen 5-325 MG per tablet   Commonly known as: NORCO/VICODIN   Take 1 tablet by mouth every 6 (six) hours as needed.      meclizine 25 MG tablet   Commonly known as: ANTIVERT   Take 25 mg by mouth 3 (three) times daily.      meloxicam 15 MG tablet   Commonly known as: MOBIC   Take 15 mg by mouth daily.      PATANOL 0.1 % ophthalmic solution   Generic drug: olopatadine   Place 1 drop into both eyes daily.           Follow-up Information    Follow up with [facility], MD. Schedule an appointment as soon as possible for a visit in 1 week.   Contact information:   [address] [place] [address] [phone]          Signed: Ricki Rodriguez 11/17/2011, 9:41 AM

## 2011-11-25 ENCOUNTER — Ambulatory Visit (INDEPENDENT_AMBULATORY_CARE_PROVIDER_SITE_OTHER): Payer: Medicare Other | Admitting: Surgery

## 2011-11-25 ENCOUNTER — Encounter (INDEPENDENT_AMBULATORY_CARE_PROVIDER_SITE_OTHER): Payer: Self-pay | Admitting: Surgery

## 2011-11-25 VITALS — BP 118/87 | HR 96 | Temp 97.6°F | Ht 62.0 in | Wt 142.2 lb

## 2011-11-25 DIAGNOSIS — L723 Sebaceous cyst: Secondary | ICD-10-CM

## 2011-11-25 DIAGNOSIS — L089 Local infection of the skin and subcutaneous tissue, unspecified: Secondary | ICD-10-CM

## 2011-11-25 NOTE — Progress Notes (Signed)
Subjective:     Patient ID: Whitney Parrish, female   DOB: 1955-05-23, 56 y.o.   MRN: [id number]  HPI She is here for a recheck of the sebaceous cyst on her left breast. She is status post incision and drainage when it was infected. She reports she is still having some discomfort.  Review of Systems     Objective:   Physical Exam    On exam, the left breast sebaceous cyst is healed and there is some mild erythema. Assessment:     Chronic infected left breast sebaceous cyst    Plan:     Removal of this area of the breast with the chronic infected sebaceous cyst is recommended for histological evaluation and to prevent recurrence. I discussed this with the patient in detail. Surgery will be scheduled

## 2011-12-01 ENCOUNTER — Encounter (HOSPITAL_COMMUNITY): Payer: Self-pay | Admitting: Pharmacy Technician

## 2011-12-07 ENCOUNTER — Encounter (HOSPITAL_COMMUNITY): Payer: Self-pay

## 2011-12-07 ENCOUNTER — Encounter (HOSPITAL_COMMUNITY)
Admission: RE | Admit: 2011-12-07 | Discharge: 2011-12-07 | Disposition: A | Discharge status: Home / Self Care | Payer: Medicare Other | Source: Ambulatory Visit | Attending: Surgery | Admitting: Surgery

## 2011-12-07 LAB — BASIC METABOLIC PANEL
BUN: 14 mg/dL (ref 6–23)
CO2: 17 mEq/L — ABNORMAL LOW (ref 19–32)
Calcium: 9.1 mg/dL (ref 8.4–10.5)
Chloride: 116 mEq/L — ABNORMAL HIGH (ref 96–112)
Creatinine, Ser: 1.93 mg/dL — ABNORMAL HIGH (ref 0.50–1.10)
GFR calc Af Amer: 32 mL/min — ABNORMAL LOW (ref >90)
GFR calc non Af Amer: 28 mL/min — ABNORMAL LOW (ref >90)
Glucose, Bld: 92 mg/dL (ref 70–99)
Potassium: 4.4 mEq/L (ref 3.5–5.1)
Sodium: 144 mEq/L (ref 135–145)

## 2011-12-07 LAB — CBC
HCT: 39.9 % (ref 36.0–46.0)
Hemoglobin: 12.7 g/dL (ref 12.0–15.0)
MCH: 28.6 pg (ref 26.0–34.0)
MCHC: 31.8 g/dL (ref 30.0–36.0)
MCV: 89.9 fL (ref 78.0–100.0)
Platelets: 302 10*3/uL (ref 150–400)
RBC: 4.44 MIL/uL (ref 3.87–5.11)
RDW: 14.2 % (ref 11.5–15.5)
WBC: 9.1 10*3/uL (ref 4.0–10.5)

## 2011-12-07 LAB — SURGICAL PCR SCREEN
MRSA, PCR: NEGATIVE
Staphylococcus aureus: NEGATIVE

## 2011-12-07 NOTE — Pre-Procedure Instructions (Signed)
bmet results routed to dr blackman inbasket

## 2011-12-07 NOTE — Pre-Procedure Instructions (Signed)
lov note dr Algie Coffer 11-13-2011 on chart Echo 08-03-2011 dr Odelia Gage on chart

## 2011-12-07 NOTE — Pre-Procedure Instructions (Signed)
lov note 12-06-2011 dr Algie Coffer on chart Pre op instructions given for pst visit using the teach back method

## 2011-12-07 NOTE — Patient Instructions (Addendum)
20 FAE BLOSSOM  12/07/2011   Your procedure is scheduled on:  12-15-2011  Report to [facility] at 0600  AM.  Call this number if you have problems the morning of surgery: [phone]   Remember:arrange driver for after surgery    Do not eat food or drink liquids:After Midnight.  .  Take these medicines the morning of surgery with A SIP OF WATER: xanax, hydrocodone, antivert, eye drop   Do not wear jewelry or make up.  Do not wear lotions, powders, or perfumes.Do not wear deodorant.    Do not bring valuables to the hospital.  Contacts, dentures or bridgework may not be worn into surgery.  Leave suitcase in the car. After surgery it may be brought to your room.  For patients admitted to the hospital, checkout time is 11:00 AM the day   discharge                             Special Instructions: CHG Shower Use Special Wash: 1/2 bottle night before surgery and 1/2 bottle morning of surgery, use regular soap on face and front and back private area. Do not shave legs for 2 days before showers   Please read over the following fact sheets that you were given: MRSA Information  Cain Sieve [facility] pre op nurse phone number [phone], call if needed

## 2011-12-14 NOTE — H&P (Signed)
Whitney Parrish is an 56 y.o. female.   Chief Complaint: chronic left breast cyst/abscess HPI: I have been following her for a chronic infected left breast cyst/abscess s/p I and D.  Now healed.  Formal resection planned. She is otherwise without complaints  Past Medical History  Diagnosis Date  . Breast abscess 10/2011    left breast  . Chest pain     last chest pain 7-15 2013 admit to [facility] for 3 days  . Depression   . Shortness of breath   . Neuromuscular disorder     bells palsy  . Anxiety   . Bell's palsy 1980's    left side  . Headache     migraine  . Arthritis     shoulders and hands  . Vertigo   . Sleep apnea 2007    no cpap given to use per pt    Past Surgical History  Procedure Date  . Left breast abscess i&d 10/2011  . Uterine fibroid surgery 1986  . Abdominal surgery for scar tissue 2007 or 2008    Family History  Problem Relation Age of Onset  . Cancer Mother     Stomach  . Cancer Father     Unknown   Social History:  reports that she has been smoking Cigarettes.  She has a 7.5 pack-year smoking history. She has never used smokeless tobacco. She reports that she does not drink alcohol or use illicit drugs.  Allergies:  Allergies  Allergen Reactions  . Sulfa Antibiotics     Makes patient see things    No prescriptions prior to admission    No results found for this or any previous visit (from the past 48 hour(s)). No results found.  Review of Systems  All other systems reviewed and are negative.    There were no vitals taken for this visit. Physical Exam  Constitutional: She is oriented to person, place, and time. She appears well-developed and well-nourished. No distress.  HENT:  Head: Normocephalic and atraumatic.  Eyes: Conjunctivae are normal. Pupils are equal, round, and reactive to light.  Neck: Normal range of motion. Neck supple.  Cardiovascular: Normal rate, regular rhythm, normal heart sounds and intact distal pulses.   No murmur  heard. Respiratory: Effort normal and breath sounds normal. No respiratory distress.  GI: Soft.  Musculoskeletal: Normal range of motion.  Neurological: She is alert and oriented to person, place, and time.  Skin: Skin is warm and dry. No rash noted. No erythema.  Psychiatric: Her behavior is normal. Judgment normal.     Assessment/Plan Chronic left breast cyst/abscess  Excision of the area of the left breast is recommended to prevent recurrence and to rule out malignancy.  Risks discussed in detail including the risk of recurrence.  She agrees to proceed.  Likelihood of success is good  Grae Cannata A 12/14/2011, 7:44 PM

## 2011-12-15 ENCOUNTER — Ambulatory Visit (HOSPITAL_COMMUNITY)
Admission: RE | Admit: 2011-12-15 | Discharge: 2011-12-15 | Disposition: A | Discharge status: Home / Self Care | Payer: Medicare Other | Source: Ambulatory Visit | Attending: Surgery | Admitting: Surgery

## 2011-12-15 ENCOUNTER — Encounter (HOSPITAL_COMMUNITY): Payer: Self-pay | Admitting: *Deleted

## 2011-12-15 ENCOUNTER — Encounter (HOSPITAL_COMMUNITY): Payer: Self-pay | Admitting: Anesthesiology

## 2011-12-15 ENCOUNTER — Encounter (HOSPITAL_COMMUNITY)
Admission: RE | Disposition: A | Discharge status: Home / Self Care | Payer: Self-pay | Source: Ambulatory Visit | Attending: Surgery

## 2011-12-15 ENCOUNTER — Ambulatory Visit (HOSPITAL_COMMUNITY): Payer: Medicare Other | Admitting: Anesthesiology

## 2011-12-15 DIAGNOSIS — L723 Sebaceous cyst: Secondary | ICD-10-CM

## 2011-12-15 DIAGNOSIS — Z01812 Encounter for preprocedural laboratory examination: Secondary | ICD-10-CM | POA: Insufficient documentation

## 2011-12-15 DIAGNOSIS — N6009 Solitary cyst of unspecified breast: Secondary | ICD-10-CM | POA: Insufficient documentation

## 2011-12-15 DIAGNOSIS — G4730 Sleep apnea, unspecified: Secondary | ICD-10-CM | POA: Insufficient documentation

## 2011-12-15 HISTORY — PX: BREAST CYST EXCISION: SHX579

## 2011-12-15 SURGERY — EXCISION, CYST, BREAST
Anesthesia: General | Site: Breast | Laterality: Left | Wound class: Clean

## 2011-12-15 MED ORDER — MIDAZOLAM HCL 5 MG/5ML IJ SOLN
INTRAMUSCULAR | Status: DC | PRN
  Administered 2011-12-15: 2 mg via INTRAVENOUS

## 2011-12-15 MED ORDER — LACTATED RINGERS IV SOLN
INTRAVENOUS | Status: DC | PRN
  Administered 2011-12-15: 08:00:00 via INTRAVENOUS

## 2011-12-15 MED ORDER — LACTATED RINGERS IV SOLN: INTRAVENOUS | Status: DC

## 2011-12-15 MED ORDER — LACTATED RINGERS IV SOLN
INTRAVENOUS | Status: DC
  Administered 2011-12-15: 10:00:00 via INTRAVENOUS

## 2011-12-15 MED ORDER — LIDOCAINE-EPINEPHRINE 1 %-1:100000 IJ SOLN
INTRAMUSCULAR | Status: AC
  Filled 2011-12-15: qty 1

## 2011-12-15 MED ORDER — PROPOFOL 10 MG/ML IV BOLUS
INTRAVENOUS | Status: DC | PRN
  Administered 2011-12-15 (×2): 20 mg via INTRAVENOUS

## 2011-12-15 MED ORDER — CEFAZOLIN SODIUM-DEXTROSE 2-3 GM-% IV SOLR
2.0000 g | INTRAVENOUS | Status: AC
  Administered 2011-12-15: 2 g via INTRAVENOUS

## 2011-12-15 MED ORDER — PROMETHAZINE HCL 25 MG/ML IJ SOLN: 6.2500 mg | INTRAMUSCULAR | Status: DC | PRN

## 2011-12-15 MED ORDER — CEFAZOLIN SODIUM-DEXTROSE 2-3 GM-% IV SOLR
INTRAVENOUS | Status: AC
  Filled 2011-12-15: qty 50

## 2011-12-15 MED ORDER — 0.9 % SODIUM CHLORIDE (POUR BTL) OPTIME
TOPICAL | Status: DC | PRN
  Administered 2011-12-15: 1000 mL

## 2011-12-15 MED ORDER — FENTANYL CITRATE 0.05 MG/ML IJ SOLN
INTRAMUSCULAR | Status: DC | PRN
  Administered 2011-12-15 (×2): 25 ug via INTRAVENOUS

## 2011-12-15 MED ORDER — BUPIVACAINE HCL 0.5 % IJ SOLN
INTRAMUSCULAR | Status: DC | PRN
  Administered 2011-12-15: 15 mL

## 2011-12-15 MED ORDER — MEPERIDINE HCL 50 MG/ML IJ SOLN: 6.2500 mg | INTRAMUSCULAR | Status: DC | PRN

## 2011-12-15 MED ORDER — FENTANYL CITRATE 0.05 MG/ML IJ SOLN: 25.0000 ug | INTRAMUSCULAR | Status: DC | PRN

## 2011-12-15 MED ORDER — HYDROCODONE-ACETAMINOPHEN 5-325 MG PO TABS: 1.0000 | ORAL_TABLET | ORAL | Status: DC | PRN

## 2011-12-15 MED ORDER — BUPIVACAINE HCL 0.5 % IJ SOLN
INTRAMUSCULAR | Status: AC
  Filled 2011-12-15: qty 1

## 2011-12-15 MED ORDER — PROPOFOL 10 MG/ML IV EMUL
INTRAVENOUS | Status: DC | PRN
  Administered 2011-12-15: 75 ug/kg/min via INTRAVENOUS

## 2011-12-15 SURGICAL SUPPLY — 34 items
ADH SKN CLS APL DERMABOND .7 (GAUZE/BANDAGES/DRESSINGS) ×1
APL SKNCLS STERI-STRIP NONHPOA (GAUZE/BANDAGES/DRESSINGS) ×1
APPLIER CLIP 9.375 MED OPEN (MISCELLANEOUS)
APR CLP MED 9.3 20 MLT OPN (MISCELLANEOUS)
BENZOIN TINCTURE PRP APPL 2/3 (GAUZE/BANDAGES/DRESSINGS) ×1 IMPLANT
CANISTER SUCTION 2500CC (MISCELLANEOUS) ×2 IMPLANT
CHLORAPREP W/TINT 26ML (MISCELLANEOUS) ×2 IMPLANT
CLIP APPLIE 9.375 MED OPEN (MISCELLANEOUS) IMPLANT
CLOTH BEACON ORANGE TIMEOUT ST (SAFETY) ×2 IMPLANT
CLSR STERI-STRIP ANTIMIC 1/2X4 (GAUZE/BANDAGES/DRESSINGS) ×1 IMPLANT
COVER PROBE W GEL 5X96 (DRAPES) IMPLANT
COVER SURGICAL LIGHT HANDLE (MISCELLANEOUS) ×2 IMPLANT
DECANTER SPIKE VIAL GLASS SM (MISCELLANEOUS) ×2 IMPLANT
DERMABOND ADVANCED (GAUZE/BANDAGES/DRESSINGS) ×1
DERMABOND ADVANCED .7 DNX12 (GAUZE/BANDAGES/DRESSINGS) ×1 IMPLANT
ELECT CAUTERY BLADE 6.4 (BLADE) ×2 IMPLANT
ELECT REM PT RETURN 9FT ADLT (ELECTROSURGICAL) ×2
ELECTRODE REM PT RTRN 9FT ADLT (ELECTROSURGICAL) ×1 IMPLANT
GLOVE SURG SIGNA 7.5 PF LTX (GLOVE) ×4 IMPLANT
GOWN PREVENTION PLUS XLARGE (GOWN DISPOSABLE) ×2 IMPLANT
GOWN STRL NON-REIN LRG LVL3 (GOWN DISPOSABLE) ×2 IMPLANT
KIT BASIN OR (CUSTOM PROCEDURE TRAY) ×2 IMPLANT
KIT MARKER MARGIN INK (KITS) IMPLANT
NS IRRIG 1000ML POUR BTL (IV SOLUTION) ×2 IMPLANT
PACK GENERAL/GYN (CUSTOM PROCEDURE TRAY) ×2 IMPLANT
SPONGE GAUZE 4X4 12PLY (GAUZE/BANDAGES/DRESSINGS) ×1 IMPLANT
SPONGE LAP 4X18 X RAY DECT (DISPOSABLE) ×2 IMPLANT
STAPLER VISISTAT 35W (STAPLE) ×2 IMPLANT
SUT MNCRL AB 4-0 PS2 18 (SUTURE) ×2 IMPLANT
SUT SILK 2 0 SH (SUTURE) IMPLANT
SUT VIC AB 3-0 SH 18 (SUTURE) ×2 IMPLANT
SYR CONTROL 10ML LL (SYRINGE) ×2 IMPLANT
TOWEL OR 17X26 10 PK STRL BLUE (TOWEL DISPOSABLE) ×2 IMPLANT
WATER STERILE IRR 1000ML POUR (IV SOLUTION) IMPLANT

## 2011-12-15 NOTE — Interval H&P Note (Signed)
History and Physical Interval Note: no change in h and p.  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 After consideration of risks, benefits and other options for treatment, the patient has consented to  Procedure(s) (LRB): CYST EXCISION BREAST (Left) as a surgical intervention .  The patient's history has been reviewed, patient examined, no change in status, stable for surgery.  I have reviewed the patient's chart and labs.  Questions were answered to the patient's satisfaction.     Kion Huntsberry A

## 2011-12-15 NOTE — Anesthesia Postprocedure Evaluation (Signed)
  Anesthesia Post-op Note  Patient: Whitney Parrish  Procedure(s) Performed: Procedure(s) (LRB): CYST EXCISION BREAST (Left)  Patient Location: PACU  Anesthesia Type: MAC  Level of Consciousness: awake and alert   Airway and Oxygen Therapy: Patient Spontanous Breathing  Post-op Pain: mild  Post-op Assessment: Post-op Vital signs reviewed, Patient's Cardiovascular Status Stable, Respiratory Function Stable, Patent Airway and No signs of Nausea or vomiting  Post-op Vital Signs: stable  Complications: No apparent anesthesia complications

## 2011-12-15 NOTE — Op Note (Signed)
CYST EXCISION BREAST  Procedure Note  Whitney Parrish 12/15/2011   Pre-op Diagnosis: left breast chronic cyst     Post-op Diagnosis: same  Procedure(s): EXCISION CHRONIC LEFT BREAST CYST  Surgeon(s): Shelly Rubenstein, MD  Anesthesia: Monitor Anesthesia Care  Staff:  Circulator Scrub Person Gerda Diss, RN - Circulator Guadelupe Sabin, CST - Scrub Person  Estimated Blood Loss: Minimal               Specimens: left breast cyst sent to path         Indications: This is a 56 year old female with a chronic sebaceous cyst on the left breast. She has had to have abscesses incised and drained in the past. She now presents for formal excision of the chronic cyst.  Procedure: The patient was brought to the operating room and identified as the correct patient. She was placed supine on the operating room table and general anesthesia was induced. Her left breast was prepped and draped in the usual sterile fashion. I anesthetized the skin surrounding the cyst with Marcaine. I made an elliptical incision around the cyst which was located at the 11:00 position of left breast with a scalpel. I did this down to the breast tissue with the electrocautery and completed the excision of the cyst. The cyst was then sent to pathology for evaluation. I anesthetized the wound for the with Marcaine. I achieved hemostasis with cautery. I closed the subcutaneous tissue with interrupted 3-0 Vicryl sutures and closed the skin with a running 4-0 Monocryl. Steri-Strips, gauze, and Tegaderm were then applied. The patient tolerated the procedure well. All the counts were correct at the end of the procedure. The patient was then taken in a stable condition from the operating room to the recovery room. Whitney Parrish A   Date: 12/15/2011  Time: 8:52 AM

## 2011-12-15 NOTE — Anesthesia Preprocedure Evaluation (Addendum)
Anesthesia Evaluation  Patient identified by MRN, date of birth, ID band Patient awake    Reviewed: Allergy & Precautions, H&P , NPO status , Patient's Chart, lab work & pertinent test results  Airway Mallampati: II TM Distance: >3 FB Neck ROM: Full    Dental No notable dental hx. (+) Partial Upper   Pulmonary neg pulmonary ROS,  breath sounds clear to auscultation  Pulmonary exam normal       Cardiovascular + angina (recent w/u at [facility]. negative.suspect panic attack related per pt report) negative cardio ROS  Rhythm:Regular Rate:Normal     Neuro/Psych negative neurological ROS  negative psych ROS   GI/Hepatic negative GI ROS, Neg liver ROS,   Endo/Other  negative endocrine ROS  Renal/GU negative Renal ROS  negative genitourinary   Musculoskeletal negative musculoskeletal ROS (+)   Abdominal   Peds negative pediatric ROS (+)  Hematology negative hematology ROS (+)   Anesthesia Other Findings   Reproductive/Obstetrics negative OB ROS                          Anesthesia Physical Anesthesia Plan  ASA: II  Anesthesia Plan: MAC   Post-op Pain Management:    Induction: Intravenous  Airway Management Planned: Simple Face Mask  Additional Equipment:   Intra-op Plan:   Post-operative Plan: Extubation in OR  Informed Consent: I have reviewed the patients History and Physical, chart, labs and discussed the procedure including the risks, benefits and alternatives for the proposed anesthesia with the patient or authorized representative who has indicated his/her understanding and acceptance.   Dental advisory given  Plan Discussed with: CRNA  Anesthesia Plan Comments:        Anesthesia Quick Evaluation

## 2011-12-15 NOTE — Transfer of Care (Signed)
Immediate Anesthesia Transfer of Care Note  Patient: Whitney Parrish  Procedure(s) Performed: Procedure(s) (LRB): CYST EXCISION BREAST (Left)  Patient Location: PACU  Anesthesia Type: MAC  Level of Consciousness: sedated, patient cooperative and responds to stimulaton  Airway & Oxygen Therapy: Patient Spontanous Breathing and Patient connected to face mask oxgen  Post-op Assessment: Report given to PACU RN and Post -op Vital signs reviewed and stable  Post vital signs: Reviewed and stable  Complications: No apparent anesthesia complications

## 2011-12-16 ENCOUNTER — Encounter (HOSPITAL_COMMUNITY): Payer: Self-pay | Admitting: Surgery

## 2011-12-28 ENCOUNTER — Encounter (INDEPENDENT_AMBULATORY_CARE_PROVIDER_SITE_OTHER): Payer: Self-pay | Admitting: Surgery

## 2011-12-28 ENCOUNTER — Encounter (INDEPENDENT_AMBULATORY_CARE_PROVIDER_SITE_OTHER): Payer: Self-pay | Admitting: General Surgery

## 2011-12-28 ENCOUNTER — Ambulatory Visit (INDEPENDENT_AMBULATORY_CARE_PROVIDER_SITE_OTHER): Payer: Medicare Other | Admitting: Surgery

## 2011-12-28 VITALS — BP 136/80 | HR 60 | Temp 99.1°F | Resp 18 | Ht 62.0 in | Wt 146.0 lb

## 2011-12-28 DIAGNOSIS — Z09 Encounter for follow-up examination after completed treatment for conditions other than malignant neoplasm: Secondary | ICD-10-CM

## 2011-12-28 NOTE — Progress Notes (Signed)
Subjective:     Patient ID: Whitney Parrish, female   DOB: 1955/07/17, 57 y.o.   MRN: [id number]  HPI She is here for her first postop visit status post excision of a chronic left breast cyst. She has no complaints Review of Systems     Objective:   Physical ExamHer incision is well healed on the left breast The final pathology showed benign pathology consistent with the chronic inflammation of the left breast without evidence of malignancy    Assessment:     Patient stable postop    Plan:     I will see her back as needed.

## 2012-12-26 ENCOUNTER — Observation Stay (HOSPITAL_COMMUNITY)
Admission: AD | Admit: 2012-12-26 | Discharge: 2012-12-29 | Disposition: A | Discharge status: Home / Self Care | Payer: Medicare Other | Source: Ambulatory Visit | Attending: Cardiovascular Disease | Admitting: Cardiovascular Disease

## 2012-12-26 ENCOUNTER — Encounter (HOSPITAL_COMMUNITY): Payer: Self-pay | Admitting: General Practice

## 2012-12-26 DIAGNOSIS — L408 Other psoriasis: Secondary | ICD-10-CM | POA: Insufficient documentation

## 2012-12-26 DIAGNOSIS — N182 Chronic kidney disease, stage 2 (mild): Secondary | ICD-10-CM | POA: Insufficient documentation

## 2012-12-26 DIAGNOSIS — R0989 Other specified symptoms and signs involving the circulatory and respiratory systems: Secondary | ICD-10-CM | POA: Insufficient documentation

## 2012-12-26 DIAGNOSIS — Z79899 Other long term (current) drug therapy: Secondary | ICD-10-CM | POA: Insufficient documentation

## 2012-12-26 DIAGNOSIS — R42 Dizziness and giddiness: Secondary | ICD-10-CM | POA: Insufficient documentation

## 2012-12-26 DIAGNOSIS — F3289 Other specified depressive episodes: Secondary | ICD-10-CM | POA: Insufficient documentation

## 2012-12-26 DIAGNOSIS — G510 Bell's palsy: Secondary | ICD-10-CM | POA: Insufficient documentation

## 2012-12-26 DIAGNOSIS — M79609 Pain in unspecified limb: Secondary | ICD-10-CM | POA: Insufficient documentation

## 2012-12-26 DIAGNOSIS — R079 Chest pain, unspecified: Principal | ICD-10-CM | POA: Insufficient documentation

## 2012-12-26 DIAGNOSIS — F17200 Nicotine dependence, unspecified, uncomplicated: Secondary | ICD-10-CM | POA: Insufficient documentation

## 2012-12-26 DIAGNOSIS — G4730 Sleep apnea, unspecified: Secondary | ICD-10-CM | POA: Insufficient documentation

## 2012-12-26 DIAGNOSIS — G43909 Migraine, unspecified, not intractable, without status migrainosus: Secondary | ICD-10-CM | POA: Insufficient documentation

## 2012-12-26 DIAGNOSIS — F329 Major depressive disorder, single episode, unspecified: Secondary | ICD-10-CM | POA: Insufficient documentation

## 2012-12-26 DIAGNOSIS — M129 Arthropathy, unspecified: Secondary | ICD-10-CM | POA: Insufficient documentation

## 2012-12-26 DIAGNOSIS — R0609 Other forms of dyspnea: Secondary | ICD-10-CM | POA: Insufficient documentation

## 2012-12-26 LAB — HEPARIN LEVEL (UNFRACTIONATED): Heparin Unfractionated: 0.21 IU/mL — ABNORMAL LOW (ref 0.30–0.70)

## 2012-12-26 LAB — PROTIME-INR
INR: 1.08 (ref 0.00–1.49)
Prothrombin Time: 13.8 seconds (ref 11.6–15.2)

## 2012-12-26 LAB — TROPONIN I
Troponin I: <0.3 ng/mL (ref <0.30)
Troponin I: <0.3 ng/mL (ref <0.30)

## 2012-12-26 LAB — CBC WITH DIFFERENTIAL/PLATELET
Basophils Absolute: 0 10*3/uL (ref 0.0–0.1)
Basophils Relative: 0 % (ref 0–1)
Eosinophils Absolute: 0.2 10*3/uL (ref 0.0–0.7)
Eosinophils Relative: 2 % (ref 0–5)
HCT: 40.5 % (ref 36.0–46.0)
Hemoglobin: 14.1 g/dL (ref 12.0–15.0)
Lymphocytes Relative: 50 % — ABNORMAL HIGH (ref 12–46)
Lymphs Abs: 4.9 10*3/uL — ABNORMAL HIGH (ref 0.7–4.0)
MCH: 29.6 pg (ref 26.0–34.0)
MCHC: 34.8 g/dL (ref 30.0–36.0)
MCV: 85.1 fL (ref 78.0–100.0)
Monocytes Absolute: 0.5 10*3/uL (ref 0.1–1.0)
Monocytes Relative: 5 % (ref 3–12)
Neutro Abs: 4.2 10*3/uL (ref 1.7–7.7)
Neutrophils Relative %: 43 % (ref 43–77)
Platelets: 296 10*3/uL (ref 150–400)
RBC: 4.76 MIL/uL (ref 3.87–5.11)
RDW: 12.6 % (ref 11.5–15.5)
WBC: 9.8 10*3/uL (ref 4.0–10.5)

## 2012-12-26 LAB — COMPREHENSIVE METABOLIC PANEL
ALT: 23 U/L (ref 0–35)
AST: 22 U/L (ref 0–37)
Albumin: 3.9 g/dL (ref 3.5–5.2)
Alkaline Phosphatase: 79 U/L (ref 39–117)
BUN: 10 mg/dL (ref 6–23)
CO2: 19 mEq/L (ref 19–32)
Calcium: 9.3 mg/dL (ref 8.4–10.5)
Chloride: 109 mEq/L (ref 96–112)
Creatinine, Ser: 1.68 mg/dL — ABNORMAL HIGH (ref 0.50–1.10)
GFR calc Af Amer: 38 mL/min — ABNORMAL LOW (ref >90)
GFR calc non Af Amer: 33 mL/min — ABNORMAL LOW (ref >90)
Glucose, Bld: 94 mg/dL (ref 70–99)
Potassium: 3.6 mEq/L (ref 3.5–5.1)
Sodium: 142 mEq/L (ref 135–145)
Total Bilirubin: 0.4 mg/dL (ref 0.3–1.2)
Total Protein: 7.7 g/dL (ref 6.0–8.3)

## 2012-12-26 LAB — APTT: aPTT: 32 seconds (ref 24–37)

## 2012-12-26 MED ORDER — ALPRAZOLAM 0.25 MG PO TABS: 0.2500 mg | ORAL_TABLET | Freq: Two times a day (BID) | ORAL | Status: DC | PRN

## 2012-12-26 MED ORDER — ASPIRIN EC 81 MG PO TBEC
81.0000 mg | DELAYED_RELEASE_TABLET | Freq: Every day | ORAL | Status: DC
  Administered 2012-12-27 – 2012-12-29 (×3): 81 mg via ORAL
  Filled 2012-12-26 (×3): qty 1

## 2012-12-26 MED ORDER — ASPIRIN 81 MG PO CHEW
324.0000 mg | CHEWABLE_TABLET | ORAL | Status: AC
  Administered 2012-12-26: 324 mg via ORAL
  Filled 2012-12-26: qty 4

## 2012-12-26 MED ORDER — NITROGLYCERIN 0.4 MG SL SUBL
0.4000 mg | SUBLINGUAL_TABLET | SUBLINGUAL | Status: DC | PRN
  Administered 2012-12-26: 0.4 mg via SUBLINGUAL
  Filled 2012-12-26: qty 25

## 2012-12-26 MED ORDER — PNEUMOCOCCAL VAC POLYVALENT 25 MCG/0.5ML IJ INJ
0.5000 mL | INJECTION | INTRAMUSCULAR | Status: AC
  Filled 2012-12-26: qty 0.5

## 2012-12-26 MED ORDER — SODIUM CHLORIDE 0.9 % IV SOLN: 250.0000 mL | INTRAVENOUS | Status: DC | PRN

## 2012-12-26 MED ORDER — SODIUM CHLORIDE 0.9 % IJ SOLN: 3.0000 mL | INTRAMUSCULAR | Status: DC | PRN

## 2012-12-26 MED ORDER — HEPARIN (PORCINE) IN NACL 100-0.45 UNIT/ML-% IJ SOLN
950.0000 [IU]/h | INTRAMUSCULAR | Status: DC
  Administered 2012-12-26: 750 [IU]/h via INTRAVENOUS
  Administered 2012-12-27 – 2012-12-28 (×2): 950 [IU]/h via INTRAVENOUS
  Filled 2012-12-26 (×3): qty 250

## 2012-12-26 MED ORDER — HEPARIN BOLUS VIA INFUSION
2000.0000 [IU] | Freq: Once | INTRAVENOUS | Status: AC
  Administered 2012-12-26: 2000 [IU] via INTRAVENOUS
  Filled 2012-12-26: qty 2000

## 2012-12-26 MED ORDER — DICYCLOMINE HCL 10 MG PO CAPS
10.0000 mg | ORAL_CAPSULE | Freq: Every morning | ORAL | Status: DC
  Administered 2012-12-26 – 2012-12-29 (×4): 10 mg via ORAL
  Filled 2012-12-26 (×4): qty 1

## 2012-12-26 MED ORDER — HEPARIN BOLUS VIA INFUSION
3000.0000 [IU] | Freq: Once | INTRAVENOUS | Status: AC
  Administered 2012-12-26: 3000 [IU] via INTRAVENOUS
  Filled 2012-12-26: qty 3000

## 2012-12-26 MED ORDER — ONDANSETRON HCL 4 MG/2ML IJ SOLN: 4.0000 mg | Freq: Four times a day (QID) | INTRAMUSCULAR | Status: DC | PRN

## 2012-12-26 MED ORDER — SODIUM CHLORIDE 0.9 % IJ SOLN
3.0000 mL | Freq: Two times a day (BID) | INTRAMUSCULAR | Status: DC
  Administered 2012-12-26: 3 mL via INTRAVENOUS

## 2012-12-26 MED ORDER — MECLIZINE HCL 25 MG PO TABS
25.0000 mg | ORAL_TABLET | Freq: Three times a day (TID) | ORAL | Status: DC
  Administered 2012-12-26 – 2012-12-29 (×8): 25 mg via ORAL
  Filled 2012-12-26 (×12): qty 1

## 2012-12-26 MED ORDER — ACETAMINOPHEN 325 MG PO TABS
650.0000 mg | ORAL_TABLET | ORAL | Status: DC | PRN
  Administered 2012-12-26 – 2012-12-28 (×2): 650 mg via ORAL
  Filled 2012-12-26 (×2): qty 2

## 2012-12-26 MED ORDER — ASPIRIN 300 MG RE SUPP
300.0000 mg | RECTAL | Status: AC
  Filled 2012-12-26: qty 1

## 2012-12-26 NOTE — Progress Notes (Signed)
Pt arrived as direct admit from MD office. Pt complaining of 8/10 chest tightness. Pt's HR 110, EKG showed normal sinus rhythm. Called MD to notify him that pt has arrived and also having pain. Pt's BP 138/77 and O2 sats 100% on RA. Gave pt one nitro, and pain subsided to 2/10. Pt does not want to take another nitro. Pt is resting in bed. Will continue to monitor.

## 2012-12-26 NOTE — Progress Notes (Signed)
Pt has been chest pain free since the one nitro that was given at 1518.

## 2012-12-26 NOTE — Consult Note (Signed)
PHARMACY CONSULT NOTE  Pharmacy Consult:  Heparin Indication: Chest pain/ACS  Allergies: Allergies  Allergen Reactions  . Sulfa Antibiotics     Makes patient see things    Height/Weight: Height: 5\' 1"  (154.9 cm) Weight: 144 lb 2.9 oz (65.4 kg) IBW/kg (Calculated) : 47.8 Dosing weight 61 kg  Vital Signs: BP 138/77  Pulse 103  Temp(Src) 98.5 F (36.9 C) (Oral)  Resp 17  Ht 5\' 1"  (1.549 m)  Wt 144 lb 2.9 oz (65.4 kg)  BMI 27.26 kg/m2  SpO2 100%  Active Problems: Active Problems:    Chest pain, R/O MI  Labs: No results found for this basename: HGB, HCT, PLT, APTT, LABPROT, INR, HEPARINUNFRC, CREATININE,  in the last 72 hours No results found for this basename: INR, PROTIME   Estimated Creatinine Clearance: 27.8 ml/min (by C-G formula based on Cr of 1.93).  Medical / Surgical History: Past Medical History  Diagnosis Date  . Breast abscess 10/2011    left breast  . Chest pain     last chest pain 7-15 2013 admit to [facility] for 3 days  . Depression   . Shortness of breath   . Neuromuscular disorder     bells palsy  . Anxiety   . Bell's palsy 1980's    left side  . Headache(784.0)     migraine  . Arthritis     shoulders and hands  . Vertigo   . Sleep apnea 2007    no cpap given to use per pt   Past Surgical History  Procedure Laterality Date  . Left breast abscess i&d  10/2011  . Uterine fibroid surgery  1986  . Abdominal surgery for scar tissue  2007 or 2008  . Breast cyst excision  12/15/2011    Procedure: CYST EXCISION BREAST;  Surgeon: Shelly Rubenstein, MD;  Location: [facility];  Service: General;  Laterality: Left;  Excision of a Chronic Left Breast Sebaceous Cyst    Medications:  Medication Sig  . Adalimumab (HUMIRA [place]) Inject 40 mg into the skin every 14 (fourteen) days. On Wednesday  . ALPRAZolam (XANAX) 0.25 MG tablet Take 0.25 mg by mouth as needed.   . cephALEXin (KEFLEX) 500 MG capsule Take 500 mg by mouth 3 (three) times daily. For 7 days  .  dicyclomine (BENTYL) 10 MG capsule Take 10 mg by mouth every morning.   Marland Kitchen HYDROcodone-acetaminophen (NORCO/VICODIN) 5-325 MG per tablet Take 1 tablet by mouth every 4 (four) hours as needed. For pain  . meclizine (ANTIVERT) 25 MG tablet Take 25 mg by mouth 3 (three) times daily.  . meloxicam (MOBIC) 15 MG tablet Take 15 mg by mouth every morning.   Marland Kitchen olopatadine (PATANOL) 0.1 % ophthalmic solution Place 1 drop into both eyes every morning.    Scheduled:  . [START ON 12/27/2012] aspirin EC  81 mg Oral Daily  . dicyclomine  10 mg Oral q morning - 10a  . meclizine  25 mg Oral TID  . [START ON 12/27/2012] pneumococcal 23 valent vaccine  0.5 mL Intramuscular Tomorrow-1000  . sodium chloride  3 mL Intravenous Q12H    Assessment:  57 y.o.female admitted with chest pain relieved by NTG.  Labs pending.   Heparin started pending work up of chest pain.  Goal of Therapy:   Heparin level 0.3-0.7 units/ml      Plan:   Heparin 3000 units x 1 load, then  Heparin infusion at 750 units/hr. Will Check Hep Level in 6 hours ,  then Daily Heparin Levels, CBC and Monitor for bleeding complications   Laurena Bering,  Pharm.D.. 12/26/2012,  4:12 PM

## 2012-12-26 NOTE — Progress Notes (Signed)
ANTICOAGULATION CONSULT NOTE   Pharmacy Consult for Heparin Indication: chest pain/ACS  Allergies  Allergen Reactions  . Sulfa Antibiotics Other (See Comments)    hallucinations    Patient Measurements: Height: 5\' 1"  (154.9 cm) Weight: 144 lb 2.9 oz (65.4 kg) IBW/kg (Calculated) : 47.8  Vital Signs: Temp: 98 F (36.7 C) (08/26 2012) Temp src: Oral (08/26 2012) BP: 127/79 mmHg (08/26 2012) Pulse Rate: 92 (08/26 2012)  Labs:  Recent Labs  12/26/12 1545 12/26/12 2057 12/26/12 2253  HGB 14.1  --   --   HCT 40.5  --   --   PLT 296  --   --   APTT 32  --   --   LABPROT 13.8  --   --   INR 1.08  --   --   HEPARINUNFRC  --   --  0.21*  CREATININE 1.68*  --   --   TROPONINI <0.30 <0.30  --     Estimated Creatinine Clearance: 32 ml/min (by C-G formula based on Cr of 1.68).   Assessment: 57 y.o. female with chest pain for heparin   Goal of Therapy:  Heparin level 0.3-0.7 units/ml Monitor platelets by anticoagulation protocol: Yes   Plan:  Heparin 2000 units IV bolus, then increase heparin 950 units/hr   Follow-up am labs.  Ethelda Deangelo, Gary Fleet 12/26/2012,11:32 PM

## 2012-12-27 DIAGNOSIS — R42 Dizziness and giddiness: Secondary | ICD-10-CM

## 2012-12-27 LAB — HEPARIN LEVEL (UNFRACTIONATED): Heparin Unfractionated: 0.63 IU/mL (ref 0.30–0.70)

## 2012-12-27 LAB — BASIC METABOLIC PANEL
BUN: 12 mg/dL (ref 6–23)
CO2: 20 mEq/L (ref 19–32)
Calcium: 9.2 mg/dL (ref 8.4–10.5)
Chloride: 107 mEq/L (ref 96–112)
Creatinine, Ser: 1.74 mg/dL — ABNORMAL HIGH (ref 0.50–1.10)
GFR calc Af Amer: 36 mL/min — ABNORMAL LOW (ref >90)
GFR calc non Af Amer: 31 mL/min — ABNORMAL LOW (ref >90)
Glucose, Bld: 189 mg/dL — ABNORMAL HIGH (ref 70–99)
Potassium: 3.5 mEq/L (ref 3.5–5.1)
Sodium: 139 mEq/L (ref 135–145)

## 2012-12-27 LAB — CBC
HCT: 43.2 % (ref 36.0–46.0)
Hemoglobin: 14.2 g/dL (ref 12.0–15.0)
MCH: 28.3 pg (ref 26.0–34.0)
MCHC: 32.9 g/dL (ref 30.0–36.0)
MCV: 86.1 fL (ref 78.0–100.0)
Platelets: 308 10*3/uL (ref 150–400)
RBC: 5.02 MIL/uL (ref 3.87–5.11)
RDW: 13.2 % (ref 11.5–15.5)
WBC: 9.3 10*3/uL (ref 4.0–10.5)

## 2012-12-27 LAB — LIPID PANEL
Cholesterol: 196 mg/dL (ref 0–200)
HDL: 51 mg/dL (ref >39)
LDL Cholesterol: 93 mg/dL (ref 0–99)
Total CHOL/HDL Ratio: 3.8 RATIO
Triglycerides: 262 mg/dL — ABNORMAL HIGH (ref <150)
VLDL: 52 mg/dL — ABNORMAL HIGH (ref 0–40)

## 2012-12-27 LAB — TROPONIN I: Troponin I: <0.3 ng/mL (ref <0.30)

## 2012-12-27 LAB — PROTIME-INR
INR: 0.99 (ref 0.00–1.49)
Prothrombin Time: 12.9 seconds (ref 11.6–15.2)

## 2012-12-27 MED ORDER — SODIUM CHLORIDE 0.9 % IV SOLN
INTRAVENOUS | Status: DC
  Administered 2012-12-27 – 2012-12-29 (×2): via INTRAVENOUS

## 2012-12-27 NOTE — Progress Notes (Signed)
  Echocardiogram 2D Echocardiogram has been performed.  Cathie Beams 12/27/2012, 2:57 PM

## 2012-12-27 NOTE — Progress Notes (Signed)
PHARMACY FOLLOW UP CONSULT NOTE   Pharmacy Consult :  Heparin Indication: chest pain/ACS  Dosing Weight: 48 kg  Labs:  Recent Labs  12/26/12 1545 12/26/12 2253 12/27/12 0841  HGB 14.1  --  14.2  HCT 40.5  --  43.2  PLT 296  --  308  INR 1.08  --  0.99  HEPARINUNFRC  --  0.21* 0.63  CREATININE 1.68*  --  1.74*   Lab Results  Component Value Date   INR 0.99 12/27/2012   INR 1.08 12/26/2012   Estimated Creatinine Clearance: 30.8 ml/min (by C-G formula based on Cr of 1.74).  Medications:  Scheduled:  . aspirin EC  81 mg Oral Daily  . dicyclomine  10 mg Oral q morning - 10a  . meclizine  25 mg Oral TID  . pneumococcal 23 valent vaccine  0.5 mL Intramuscular Tomorrow-1000  . sodium chloride  3 mL Intravenous Q12H   Infusions:  . sodium chloride    . heparin 950 Units/hr (12/26/12 2343)    Assessment:  57 y/o female on Heparin infusion for ACS/Chest pain  Goal of Therapy:  Heparin level 0.3-0.7 units/ml   Plan:   Continue Heparin 950 units/hr. Daily Heparin Levels, CBC.  Monitor for bleeding complications   Laurena Bering, Pharm.D.  12/27/2012,10:54 AM

## 2012-12-27 NOTE — Progress Notes (Signed)
VASCULAR LAB PRELIMINARY  PRELIMINARY  PRELIMINARY  PRELIMINARY  Carotid duplex completed.    Preliminary report:  Bilateral:  1-39% ICA stenosis.  Vertebral artery flow is antegrade.      Eun Vermeer, RVT 12/27/2012, 3:07 PM

## 2012-12-27 NOTE — Care Management Note (Unsigned)
    Page 1 of 1   12/27/2012     3:51:18 PM   CARE MANAGEMENT NOTE 12/27/2012  Patient:  Whitney Parrish, Whitney Parrish   Account Number:  [id number]  Date Initiated:  12/27/2012  Documentation initiated by:  Aireona Torelli  Subjective/Objective Assessment:   PT ADM ON 12/26/12 WITH CHEST PAIN.  PTA, PT LIVES ALONE AND IS INDEPENDENT.     Action/Plan:   WILL FOLLOW FOR HOME NEEDS AS PT PROGRESSES.   Anticipated DC Date:  12/28/2012   Anticipated DC Plan:  HOME/SELF CARE      DC Planning Services  CM consult      Choice offered to / List presented to:             Status of service:  In process, will continue to follow Medicare Important Message given?   (If response is "NO", the following Medicare IM given date fields will be blank) Date Medicare IM given:   Date Additional Medicare IM given:    Discharge Disposition:    Per UR Regulation:  Reviewed for med. necessity/level of care/duration of stay  If discussed at Long Length of Stay Meetings, dates discussed:    Comments:

## 2012-12-27 NOTE — Progress Notes (Signed)
Subjective:  Dizziness persist. No chest pain this AM.  Objective:  Vital Signs in the last 24 hours: Temp:  [97.5 F (36.4 C)-98.5 F (36.9 C)] 97.5 F (36.4 C) (08/27 0452) Pulse Rate:  [81-103] 81 (08/27 0452) Cardiac Rhythm:  [-] Normal sinus rhythm (08/27 0832) Resp:  [17-18] 18 (08/27 0452) BP: (95-138)/(68-79) 95/68 mmHg (08/27 0452) SpO2:  [100 %] 100 % (08/27 0452) Weight:  [65.091 kg (143 lb 8 oz)-65.454 kg (144 lb 4.8 oz)] 65.091 kg (143 lb 8 oz) (08/27 0452)  Physical Exam: BP Readings from Last 1 Encounters:  12/27/12 95/68     Wt Readings from Last 1 Encounters:  12/27/12 65.091 kg (143 lb 8 oz)    Weight change:   HEENT: [place]/AT, Eyes-Brown, PERL, EOMI, wears glasses, Conjunctiva-Pink, Sclera-Non-icteric Neck: No JVD, No bruit, Trachea midline. Lungs:  Clear, Bilateral. Cardiac:  Regular rhythm, normal S1 and S2, no S3. II/VI systolic murmur. Abdomen:  Soft, non-tender. Extremities:  No edema present. No cyanosis. No clubbing. CNS: AxOx3, Cranial nerves grossly intact, moves all 4 extremities. Right handed. Skin: Warm and dry.   Intake/Output from previous day: 08/26 0701 - 08/27 0700 In: 240 [P.O.:240] Out: 350 [Urine:350]    Lab Results: BMET    Component Value Date/Time   NA 142 12/26/2012 1545   K 3.6 12/26/2012 1545   CL 109 12/26/2012 1545   CO2 19 12/26/2012 1545   GLUCOSE 94 12/26/2012 1545   BUN 10 12/26/2012 1545   CREATININE 1.68* 12/26/2012 1545   CALCIUM 9.3 12/26/2012 1545   GFRNONAA 33* 12/26/2012 1545   GFRAA 38* 12/26/2012 1545   CBC    Component Value Date/Time   WBC 9.3 12/27/2012 0841   RBC 5.02 12/27/2012 0841   HGB 14.2 12/27/2012 0841   HCT 43.2 12/27/2012 0841   PLT 308 12/27/2012 0841   MCV 86.1 12/27/2012 0841   MCH 28.3 12/27/2012 0841   MCHC 32.9 12/27/2012 0841   RDW 13.2 12/27/2012 0841   LYMPHSABS 4.9* 12/26/2012 1545   MONOABS 0.5 12/26/2012 1545   EOSABS 0.2 12/26/2012 1545   BASOSABS 0.0 12/26/2012 1545   CARDIAC  ENZYMES Lab Results  Component Value Date   CKTOTAL 364* 11/16/2011   CKMB 8.0* 11/16/2011   TROPONINI <0.30 12/26/2012    Scheduled Meds: . aspirin EC  81 mg Oral Daily  . dicyclomine  10 mg Oral q morning - 10a  . meclizine  25 mg Oral TID  . pneumococcal 23 valent vaccine  0.5 mL Intramuscular Tomorrow-1000  . sodium chloride  3 mL Intravenous Q12H   Continuous Infusions: . sodium chloride    . heparin 950 Units/hr (12/26/12 2343)   PRN Meds:.sodium chloride, acetaminophen, ALPRAZolam, nitroGLYCERIN, ondansetron (ZOFRAN) IV, sodium chloride  Assessment/Plan: Chest pain r/o MI  Dizziness  Chronic myalgia  S/P Arthritis  S/P Bell's Palsy  Depression  Chronic kidney disease, II  Echocardiogram and carotid doppler.   LOS: 1 day    [name]  MD  12/27/2012, 9:25 AM

## 2012-12-27 NOTE — H&P (Signed)
Late entry Whitney Parrish is an 57 y.o. female.   Chief Complaint: Chest pain and dizziness HPI: 57 years old female with arthritis, myalgia has recurrent dizziness and exertional dyspnea and pressure type chest pain without sweating spell or nausea.   Past Medical History  Diagnosis Date  . Breast abscess 10/2011    left breast  . Chest pain     last chest pain 7-15 2013 admit to [facility] for 3 days  . Depression   . Shortness of breath   . Neuromuscular disorder     bells palsy  . Anxiety   . Bell's palsy 1980's    left side  . Headache(784.0)     migraine  . Arthritis     shoulders and hands  . Vertigo   . Sleep apnea 2007    no cpap given to use per pt      Past Surgical History  Procedure Laterality Date  . Left breast abscess i&d  10/2011  . Uterine fibroid surgery  1986  . Abdominal surgery for scar tissue  2007 or 2008  . Breast cyst excision  12/15/2011    Procedure: CYST EXCISION BREAST;  Surgeon: Shelly Rubenstein, MD;  Location: [facility];  Service: General;  Laterality: Left;  Excision of a Chronic Left Breast Sebaceous Cyst    Family History  Problem Relation Age of Onset  . Cancer Mother     Stomach  . Cancer Father     Unknown   Social History:  reports that she has been smoking Cigarettes.  She has a 7.5 pack-year smoking history. She has never used smokeless tobacco. She reports that she does not drink alcohol or use illicit drugs.  Allergies:  Allergies  Allergen Reactions  . Sulfa Antibiotics Other (See Comments)    hallucinations    Medications Prior to Admission  Medication Sig Dispense Refill  . adalimumab (HUMIRA) 40 MG/0.8ML injection Inject 40 mg into the skin every 14 (fourteen) days. Every other Wednesday, last injection 12/20/12      . cholecalciferol (VITAMIN D) 1000 UNITS tablet Take 1,000 Units by mouth daily.      Marland Kitchen dicyclomine (BENTYL) 10 MG capsule Take 10 mg by mouth 3 (three) times daily.      Marland Kitchen HYDROcodone-acetaminophen  (NORCO/VICODIN) 5-325 MG per tablet Take 1 tablet by mouth daily as needed for pain.      . meclizine (ANTIVERT) 25 MG tablet Take 25 mg by mouth 3 (three) times daily.      . Multiple Vitamin (MULTIVITAMIN WITH MINERALS) TABS tablet Take 1 tablet by mouth daily.      Marland Kitchen PRESCRIPTION MEDICATION Apply 1 application topically at bedtime. Psoriasis cream compounded by [facility]:  Salicylic acid (petrolatum) 30% ointment        Results for orders placed during the hospital encounter of 12/26/12 (from the past 48 hour(s))  TROPONIN I     Status: None   Collection Time    12/26/12  3:45 PM      Result Value Range   Troponin I <0.30  <0.30 ng/mL   Comment:            Due to the release kinetics of cTnI,     a negative result within the first hours     of the onset of symptoms does not rule out     myocardial infarction with certainty.     If myocardial infarction is still suspected,     repeat  the test at appropriate intervals.  PROTIME-INR     Status: None   Collection Time    12/26/12  3:45 PM      Result Value Range   Prothrombin Time 13.8  11.6 - 15.2 seconds   INR 1.08  0.00 - 1.49  APTT     Status: None   Collection Time    12/26/12  3:45 PM      Result Value Range   aPTT 32  24 - 37 seconds  CBC WITH DIFFERENTIAL     Status: Abnormal   Collection Time    12/26/12  3:45 PM      Result Value Range   WBC 9.8  4.0 - 10.5 K/uL   RBC 4.76  3.87 - 5.11 MIL/uL   Hemoglobin 14.1  12.0 - 15.0 g/dL   HCT 16.1  09.6 - 04.5 %   MCV 85.1  78.0 - 100.0 fL   MCH 29.6  26.0 - 34.0 pg   MCHC 34.8  30.0 - 36.0 g/dL   RDW 40.9  81.1 - 91.4 %   Platelets 296  150 - 400 K/uL   Neutrophils Relative % 43  43 - 77 %   Neutro Abs 4.2  1.7 - 7.7 K/uL   Lymphocytes Relative 50 (*) 12 - 46 %   Lymphs Abs 4.9 (*) 0.7 - 4.0 K/uL   Monocytes Relative 5  3 - 12 %   Monocytes Absolute 0.5  0.1 - 1.0 K/uL   Eosinophils Relative 2  0 - 5 %   Eosinophils Absolute 0.2  0.0 - 0.7 K/uL    Basophils Relative 0  0 - 1 %   Basophils Absolute 0.0  0.0 - 0.1 K/uL  COMPREHENSIVE METABOLIC PANEL     Status: Abnormal   Collection Time    12/26/12  3:45 PM      Result Value Range   Sodium 142  135 - 145 mEq/L   Potassium 3.6  3.5 - 5.1 mEq/L   Chloride 109  96 - 112 mEq/L   CO2 19  19 - 32 mEq/L   Glucose, Bld 94  70 - 99 mg/dL   BUN 10  6 - 23 mg/dL   Creatinine, Ser 7.82 (*) 0.50 - 1.10 mg/dL   Calcium 9.3  8.4 - 95.6 mg/dL   Total Protein 7.7  6.0 - 8.3 g/dL   Albumin 3.9  3.5 - 5.2 g/dL   AST 22  0 - 37 U/L   ALT 23  0 - 35 U/L   Alkaline Phosphatase 79  39 - 117 U/L   Total Bilirubin 0.4  0.3 - 1.2 mg/dL   GFR calc non Af Amer 33 (*) >90 mL/min   GFR calc Af Amer 38 (*) >90 mL/min   Comment: (NOTE)     The eGFR has been calculated using the CKD EPI equation.     This calculation has not been validated in all clinical situations.     eGFR's persistently <90 mL/min signify possible Chronic Kidney     Disease.  TROPONIN I     Status: None   Collection Time    12/26/12  8:57 PM      Result Value Range   Troponin I <0.30  <0.30 ng/mL   Comment:            Due to the release kinetics of cTnI,     a negative result within the first hours  of the onset of symptoms does not rule out     myocardial infarction with certainty.     If myocardial infarction is still suspected,     repeat the test at appropriate intervals.  HEPARIN LEVEL (UNFRACTIONATED)     Status: Abnormal   Collection Time    12/26/12 10:53 PM      Result Value Range   Heparin Unfractionated 0.21 (*) 0.30 - 0.70 IU/mL   Comment:            IF HEPARIN RESULTS ARE BELOW     EXPECTED VALUES, AND PATIENT     DOSAGE HAS BEEN CONFIRMED,     SUGGEST FOLLOW UP TESTING     OF ANTITHROMBIN III LEVELS.   No results found.  @ROS @ + weight loss, Wears reading glasses, no hearing loss, Wears partial upper dentures, +chest pain, + nausea, + Abd. Pain, + Bell's palsy, + fibromyalgia, + Psoriasis. No  hepatitis, No kidney stone, No gastrointestinal bleed, no hepatitis, No stroke.  Blood pressure 95/68, pulse 81, temperature 97.5 F (36.4 C), temperature source Oral, resp. rate 18, height 5\' 1"  (1.549 m), weight 65.091 kg (143 lb 8 oz), SpO2 100.00%.  General appearance: alert, cooperative and appears stated age  HENT: Normocephalic, without obvious abnormality, atraumatic. Brown eyes, conjunctivae/corneas clear. PERRL, EOM's intact. Wears glasses. Throat: lips, mucosa, and tongue normal; teeth.  Neck: no adenopathy, no carotid bruit, no JVD, supple, trachea midline and thyroid not enlarged.  Resp: clear to auscultation bilaterally.  Cardio: S1, S2 normal. II/VI systolic murmur.  GI: soft,  bowel sounds normal; no masses, no organomegaly.  Extremities: extremities normal, atraumatic, no cyanosis or edema  Skin: Skin color, texture, turgor normal.  Neurologic: Alert and oriented X 3, moves all 4 extremities.   Assessment/Plan Chest pain r/o MI Dizziness Chronic myalgia S/P Arthritis S/P Bell's Palsy Depression Chronic kidney disease, II  Nuclear stress test/ Home medications  Whitney Parrish S 12/27/2012, 8:46 AM

## 2012-12-28 ENCOUNTER — Observation Stay (HOSPITAL_COMMUNITY): Payer: Medicare Other

## 2012-12-28 HISTORY — PX: TRANSTHORACIC ECHOCARDIOGRAM: SHX275

## 2012-12-28 HISTORY — PX: CARDIOVASCULAR STRESS TEST: SHX262

## 2012-12-28 LAB — BASIC METABOLIC PANEL
BUN: 14 mg/dL (ref 6–23)
CO2: 20 mEq/L (ref 19–32)
Calcium: 8.9 mg/dL (ref 8.4–10.5)
Chloride: 111 mEq/L (ref 96–112)
Creatinine, Ser: 1.74 mg/dL — ABNORMAL HIGH (ref 0.50–1.10)
GFR calc Af Amer: 36 mL/min — ABNORMAL LOW (ref >90)
GFR calc non Af Amer: 31 mL/min — ABNORMAL LOW (ref >90)
Glucose, Bld: 105 mg/dL — ABNORMAL HIGH (ref 70–99)
Potassium: 4.1 mEq/L (ref 3.5–5.1)
Sodium: 141 mEq/L (ref 135–145)

## 2012-12-28 LAB — CBC
HCT: 38.1 % (ref 36.0–46.0)
Hemoglobin: 13 g/dL (ref 12.0–15.0)
MCH: 29.2 pg (ref 26.0–34.0)
MCHC: 34.1 g/dL (ref 30.0–36.0)
MCV: 85.6 fL (ref 78.0–100.0)
Platelets: 267 10*3/uL (ref 150–400)
RBC: 4.45 MIL/uL (ref 3.87–5.11)
RDW: 12.8 % (ref 11.5–15.5)
WBC: 8.7 10*3/uL (ref 4.0–10.5)

## 2012-12-28 LAB — HEPARIN LEVEL (UNFRACTIONATED): Heparin Unfractionated: 0.45 IU/mL (ref 0.30–0.70)

## 2012-12-28 MED ORDER — TECHNETIUM TC 99M SESTAMIBI GENERIC - CARDIOLITE
30.0000 | Freq: Once | INTRAVENOUS | Status: AC | PRN
  Administered 2012-12-28: 30 via INTRAVENOUS

## 2012-12-28 MED ORDER — TECHNETIUM TC 99M SESTAMIBI GENERIC - CARDIOLITE
10.0000 | Freq: Once | INTRAVENOUS | Status: AC | PRN
  Administered 2012-12-28: 10 via INTRAVENOUS

## 2012-12-28 MED ORDER — REGADENOSON 0.4 MG/5ML IV SOLN
0.4000 mg | Freq: Once | INTRAVENOUS | Status: AC
  Administered 2012-12-28: 0.4 mg via INTRAVENOUS

## 2012-12-28 NOTE — Progress Notes (Signed)
PHARMACY FOLLOW UP CONSULT NOTE   Pharmacy Consult :  Heparin Indication: chest pain/ACS  Dosing Weight: 48 kg  Labs:  Recent Labs  12/26/12 1545 12/26/12 2253 12/27/12 0841 12/28/12 0415  HGB 14.1  --  14.2 13.0  HCT 40.5  --  43.2 38.1  PLT 296  --  308 267  INR 1.08  --  0.99  --   HEPARINUNFRC  --  0.21* 0.63 0.45  CREATININE 1.68*  --  1.74* 1.74*   Lab Results  Component Value Date   INR 0.99 12/27/2012   INR 1.08 12/26/2012   Estimated Creatinine Clearance: 31.4 ml/min (by C-G formula based on Cr of 1.74).  Medications:  Scheduled:  . aspirin EC  81 mg Oral Daily  . dicyclomine  10 mg Oral q morning - 10a  . meclizine  25 mg Oral TID  . sodium chloride  3 mL Intravenous Q12H   Infusions:  . sodium chloride 50 mL/hr at 12/27/12 1540  . heparin 950 Units/hr (12/27/12 1539)    Assessment:  57 y/o female on Heparin infusion for ACS/Chest pain.  Heparin level at goal this am at 0.45 with heparin at 950 units/hr.  No complications noted.  Goal of Therapy:  Heparin level 0.3-0.7 units/ml   Plan:   Continue Heparin 950 units/hr. Daily Heparin Levels, CBC.  Monitor for bleeding complications   Jill Side L. Illene Bolus, PharmD, BCPS Clinical Pharmacist Pager: [phone] Pharmacy: [phone] 12/28/2012 11:19 AM

## 2012-12-29 LAB — CBC
HCT: 38.4 % (ref 36.0–46.0)
Hemoglobin: 12.9 g/dL (ref 12.0–15.0)
MCH: 28.8 pg (ref 26.0–34.0)
MCHC: 33.6 g/dL (ref 30.0–36.0)
MCV: 85.7 fL (ref 78.0–100.0)
Platelets: 260 10*3/uL (ref 150–400)
RBC: 4.48 MIL/uL (ref 3.87–5.11)
RDW: 12.9 % (ref 11.5–15.5)
WBC: 8.2 10*3/uL (ref 4.0–10.5)

## 2012-12-29 LAB — HEPARIN LEVEL (UNFRACTIONATED): Heparin Unfractionated: 0.42 IU/mL (ref 0.30–0.70)

## 2012-12-29 MED ORDER — NITROGLYCERIN 0.4 MG SL SUBL: 0.4000 mg | SUBLINGUAL_TABLET | SUBLINGUAL | Status: DC | PRN

## 2012-12-29 NOTE — Discharge Summary (Signed)
Physician Discharge Summary  Patient ID: Whitney Parrish MRN: [id number] DOB/AGE: 01-19-1956 57 y.o.  Admit date: 12/26/2012 Discharge date: 12/29/2012  Admission Diagnoses: Chest pain r/o MI  Dizziness  Chronic myalgia  S/P Arthritis  S/P Bell's Palsy  Depression  Chronic kidney disease, II  Discharge Diagnoses:  Principle Problem: *  Chest pain Dizziness  Chronic myalgia  S/P Arthritis  S/P Bell's Palsy  Depression  Chronic kidney disease, II Psoriasis   Discharged Condition: good  Hospital Course: 57 years old female with arthritis, myalgia has recurrent dizziness and exertional dyspnea and pressure type chest pain without sweating spell or nausea. She had no reversible ischemia on stress test. Carotid doppler showed less than 39 % bilateral internal carotid artery stenoses.   Consults: None  Significant Diagnostic Studies: labs: Normal CBC and BMET and cardiaqc enzymes. Normal cholesterol with mildly elevated triglycerides of 262 mg/dl.  EKG-NSR  Echocardiogram showed: - Left ventricle: The cavity size was normal. There was mild concentric hypertrophy. Systolic function was vigorous. The estimated ejection fraction was in the range of 65% to 70%. Wall motion was normal; there were no regional wall motion abnormalities. Doppler parameters are consistent with abnormal left ventricular relaxation (grade 1 diastolic dysfunction). - Mitral valve: Mild regurgitation.  Carotid doppler showed: less than 39 % bilateral internal carotid artery stenoses.  Treatments: IV hydration, meclizine and cardiac meds: NTG SL as needed  Discharge Exam: Blood pressure 116/74, pulse 71, temperature 98.4 F (36.9 C), temperature source Oral, resp. rate 18, height 5\' 1"  (1.549 m), weight 67.1 kg (147 lb 14.9 oz), SpO2 97.00%. HEENT: [place]/AT, Eyes-Brown, PERL, EOMI, Conjunctiva-Pink, Sclera-Non-icteric  Neck: No JVD, No bruit, Trachea midline.  Lungs: Clear, Bilateral.  Cardiac: Regular  rhythm, normal S1 and S2, no S3.  Abdomen: Soft, non-tender.  Extremities: No edema present. No cyanosis. No clubbing.  CNS: AxOx3, Cranial nerves grossly intact, moves all 4 extremities. Right handed.  Skin: Warm and dry.  Disposition: 01-Home or Self Care     Medication List         cholecalciferol 1000 UNITS tablet  Commonly known as:  VITAMIN D  Take 1,000 Units by mouth daily.     dicyclomine 10 MG capsule  Commonly known as:  BENTYL  Take 10 mg by mouth 3 (three) times daily.     HUMIRA 40 MG/0.8ML injection  Generic drug:  adalimumab  Inject 40 mg into the skin every 14 (fourteen) days. Every other Wednesday, last injection 12/20/12     HYDROcodone-acetaminophen 5-325 MG per tablet  Commonly known as:  NORCO/VICODIN  Take 1 tablet by mouth daily as needed for pain.     meclizine 25 MG tablet  Commonly known as:  ANTIVERT  Take 25 mg by mouth 3 (three) times daily.     multivitamin with minerals Tabs tablet  Take 1 tablet by mouth daily.     nitroGLYCERIN 0.4 MG SL tablet  Commonly known as:  NITROSTAT  Place 1 tablet (0.4 mg total) under the tongue every 5 (five) minutes x 3 doses as needed for chest pain.     PRESCRIPTION MEDICATION  Apply 1 application topically at bedtime. Psoriasis cream compounded by [facility]:  Salicylic acid (petrolatum) 30% ointment         Signed: Bemnet Trovato S 12/29/2012, 8:51 AM

## 2012-12-29 NOTE — Progress Notes (Signed)
Late entry: Subjective:  Awaiting nuclear stress test.   Objective:  Vital Signs in the last 24 hours: Temp:  [98 F (36.7 C)-98.9 F (37.2 C)] 98.4 F (36.9 C) (08/29 0603) Pulse Rate:  [67-115] 71 (08/29 0603) Cardiac Rhythm:  [-] Normal sinus rhythm (08/29 0808) Resp:  [18-22] 18 (08/29 0603) BP: (116-149)/(70-92) 116/74 mmHg (08/29 0603) SpO2:  [97 %-99 %] 97 % (08/29 0603) Weight:  [67.1 kg (147 lb 14.9 oz)] 67.1 kg (147 lb 14.9 oz) (08/29 0603)  Physical Exam: BP Readings from Last 1 Encounters:  12/29/12 116/74     Wt Readings from Last 1 Encounters:  12/29/12 67.1 kg (147 lb 14.9 oz)    Weight change: -0.395 kg (-13.9 oz)  HEENT: [place]/AT, Eyes-Brown, PERL, EOMI, Conjunctiva-Pink, Sclera-Non-icteric Neck: No JVD, No bruit, Trachea midline. Lungs:  Clear, Bilateral. Cardiac:  Regular rhythm, normal S1 and S2, no S3.  Abdomen:  Soft, non-tender. Extremities:  No edema present. No cyanosis. No clubbing. CNS: AxOx3, Cranial nerves grossly intact, moves all 4 extremities. Right handed. Skin: Warm and dry.   Intake/Output from previous day: 08/28 0701 - 08/29 0700 In: 720 [P.O.:720] Out: 200 [Urine:200]    Lab Results: BMET    Component Value Date/Time   NA 141 12/28/2012 0415   K 4.1 12/28/2012 0415   CL 111 12/28/2012 0415   CO2 20 12/28/2012 0415   GLUCOSE 105* 12/28/2012 0415   BUN 14 12/28/2012 0415   CREATININE 1.74* 12/28/2012 0415   CALCIUM 8.9 12/28/2012 0415   GFRNONAA 31* 12/28/2012 0415   GFRAA 36* 12/28/2012 0415   CBC    Component Value Date/Time   WBC 8.2 12/29/2012 0450   RBC 4.48 12/29/2012 0450   HGB 12.9 12/29/2012 0450   HCT 38.4 12/29/2012 0450   PLT 260 12/29/2012 0450   MCV 85.7 12/29/2012 0450   MCH 28.8 12/29/2012 0450   MCHC 33.6 12/29/2012 0450   RDW 12.9 12/29/2012 0450   LYMPHSABS 4.9* 12/26/2012 1545   MONOABS 0.5 12/26/2012 1545   EOSABS 0.2 12/26/2012 1545   BASOSABS 0.0 12/26/2012 1545   CARDIAC ENZYMES Lab Results  Component Value  Date   CKTOTAL 364* 11/16/2011   CKMB 8.0* 11/16/2011   TROPONINI <0.30 12/27/2012    Scheduled Meds: . aspirin EC  81 mg Oral Daily  . dicyclomine  10 mg Oral q morning - 10a  . meclizine  25 mg Oral TID  . sodium chloride  3 mL Intravenous Q12H   Continuous Infusions: . sodium chloride 50 mL/hr at 12/29/12 0244  . heparin 950 Units/hr (12/28/12 1942)   PRN Meds:.sodium chloride, acetaminophen, ALPRAZolam, nitroGLYCERIN, ondansetron (ZOFRAN) IV, sodium chloride  Assessment/Plan: Chest pain r/o MI  Dizziness  Chronic myalgia  S/P Arthritis  S/P Bell's Palsy  Depression  Chronic kidney disease, II  Nuclear stress test today.   LOS: 3 days    [name]  MD  12/29/2012, 8:47 AM

## 2013-03-08 ENCOUNTER — Other Ambulatory Visit: Payer: Self-pay

## 2013-08-30 ENCOUNTER — Ambulatory Visit
Admission: RE | Admit: 2013-08-30 | Discharge: 2013-08-30 | Disposition: A | Discharge status: Home / Self Care | Payer: Medicare Other | Source: Ambulatory Visit | Attending: Cardiovascular Disease | Admitting: Cardiovascular Disease

## 2013-08-30 ENCOUNTER — Other Ambulatory Visit: Payer: Self-pay | Admitting: Cardiovascular Disease

## 2013-08-30 DIAGNOSIS — M549 Dorsalgia, unspecified: Secondary | ICD-10-CM

## 2014-02-15 ENCOUNTER — Other Ambulatory Visit: Payer: Self-pay

## 2014-04-03 ENCOUNTER — Ambulatory Visit
Admission: RE | Admit: 2014-04-03 | Discharge: 2014-04-03 | Disposition: A | Discharge status: Home / Self Care | Payer: Medicare Other | Source: Ambulatory Visit | Attending: Cardiovascular Disease | Admitting: Cardiovascular Disease

## 2014-04-03 ENCOUNTER — Other Ambulatory Visit: Payer: Self-pay | Admitting: Cardiovascular Disease

## 2014-04-03 DIAGNOSIS — J4520 Mild intermittent asthma, uncomplicated: Secondary | ICD-10-CM

## 2014-09-05 ENCOUNTER — Encounter (HOSPITAL_COMMUNITY): Payer: Self-pay | Admitting: Emergency Medicine

## 2014-09-05 ENCOUNTER — Emergency Department (HOSPITAL_COMMUNITY)
Admission: EM | Admit: 2014-09-05 | Discharge: 2014-09-05 | Disposition: A | Discharge status: Home / Self Care | Payer: Medicare Other | Attending: Emergency Medicine | Admitting: Emergency Medicine

## 2014-09-05 ENCOUNTER — Emergency Department (HOSPITAL_COMMUNITY): Payer: Medicare Other

## 2014-09-05 DIAGNOSIS — Z79899 Other long term (current) drug therapy: Secondary | ICD-10-CM | POA: Insufficient documentation

## 2014-09-05 DIAGNOSIS — R110 Nausea: Secondary | ICD-10-CM | POA: Insufficient documentation

## 2014-09-05 DIAGNOSIS — Z8742 Personal history of other diseases of the female genital tract: Secondary | ICD-10-CM | POA: Insufficient documentation

## 2014-09-05 DIAGNOSIS — Z8669 Personal history of other diseases of the nervous system and sense organs: Secondary | ICD-10-CM | POA: Diagnosis not present

## 2014-09-05 DIAGNOSIS — F419 Anxiety disorder, unspecified: Secondary | ICD-10-CM | POA: Insufficient documentation

## 2014-09-05 DIAGNOSIS — M1990 Unspecified osteoarthritis, unspecified site: Secondary | ICD-10-CM | POA: Diagnosis not present

## 2014-09-05 DIAGNOSIS — R000 Tachycardia, unspecified: Secondary | ICD-10-CM | POA: Diagnosis not present

## 2014-09-05 DIAGNOSIS — R079 Chest pain, unspecified: Secondary | ICD-10-CM | POA: Insufficient documentation

## 2014-09-05 DIAGNOSIS — Z792 Long term (current) use of antibiotics: Secondary | ICD-10-CM | POA: Insufficient documentation

## 2014-09-05 DIAGNOSIS — Z720 Tobacco use: Secondary | ICD-10-CM | POA: Insufficient documentation

## 2014-09-05 DIAGNOSIS — R0602 Shortness of breath: Secondary | ICD-10-CM | POA: Insufficient documentation

## 2014-09-05 LAB — BASIC METABOLIC PANEL
Anion gap: 9 (ref 5–15)
BUN: 14 mg/dL (ref 6–20)
CO2: 19 mmol/L — ABNORMAL LOW (ref 22–32)
Calcium: 9 mg/dL (ref 8.9–10.3)
Chloride: 112 mmol/L — ABNORMAL HIGH (ref 101–111)
Creatinine, Ser: 1.84 mg/dL — ABNORMAL HIGH (ref 0.44–1.00)
GFR calc Af Amer: 34 mL/min — ABNORMAL LOW (ref >60)
GFR calc non Af Amer: 29 mL/min — ABNORMAL LOW (ref >60)
Glucose, Bld: 119 mg/dL — ABNORMAL HIGH (ref 70–99)
Potassium: 3.5 mmol/L (ref 3.5–5.1)
Sodium: 140 mmol/L (ref 135–145)

## 2014-09-05 LAB — CBC
HCT: 40.9 % (ref 36.0–46.0)
Hemoglobin: 13.5 g/dL (ref 12.0–15.0)
MCH: 29.3 pg (ref 26.0–34.0)
MCHC: 33 g/dL (ref 30.0–36.0)
MCV: 88.7 fL (ref 78.0–100.0)
Platelets: 286 10*3/uL (ref 150–400)
RBC: 4.61 MIL/uL (ref 3.87–5.11)
RDW: 12.7 % (ref 11.5–15.5)
WBC: 12.3 10*3/uL — ABNORMAL HIGH (ref 4.0–10.5)

## 2014-09-05 LAB — I-STAT TROPONIN, ED
Troponin i, poc: 0 ng/mL (ref 0.00–0.08)
Troponin i, poc: 0 ng/mL (ref 0.00–0.08)

## 2014-09-05 LAB — D-DIMER, QUANTITATIVE: D-Dimer, Quant: 0.34 ug/mL-FEU (ref 0.00–0.48)

## 2014-09-05 MED ORDER — NITROGLYCERIN 0.4 MG SL SUBL
0.4000 mg | SUBLINGUAL_TABLET | SUBLINGUAL | Status: DC | PRN
  Administered 2014-09-05: 0.4 mg via SUBLINGUAL
  Filled 2014-09-05: qty 1

## 2014-09-05 MED ORDER — ASPIRIN 325 MG PO TABS
325.0000 mg | ORAL_TABLET | Freq: Every day | ORAL | Status: DC
  Administered 2014-09-05: 325 mg via ORAL
  Filled 2014-09-05: qty 1

## 2014-09-05 NOTE — ED Notes (Signed)
MD at bedside. Dr. Glick. 

## 2014-09-05 NOTE — ED Notes (Signed)
Pt A&Ox4, ambulatory at d/c with steady gait, NAD, declined wheelchair. 

## 2014-09-05 NOTE — ED Notes (Signed)
Pt reports mid sternal chest pains off and on since Saturday with chest tightness and SOB. Lungs CTA. Also reports stomach cramps. Denies, nausea, vomiting. Reports some diarrhea due to IBS.

## 2014-09-05 NOTE — Discharge Instructions (Signed)

## 2014-09-05 NOTE — ED Notes (Signed)
MD at bedside. 

## 2014-09-05 NOTE — ED Provider Notes (Signed)
CSN: [id number]     Arrival date & time 09/05/14  1427 History   First MD Initiated Contact with Patient 09/05/14 1706     Chief Complaint  Patient presents with  . Chest Pain  . Anxiety     (Consider location/radiation/quality/duration/timing/severity/associated sxs/prior Treatment) Patient is a 59 y.o. female presenting with chest pain. The history is provided by the patient. No language interpreter was used.  Chest Pain Pain location:  Substernal area and L chest Pain quality: pressure and sharp   Pain radiates to:  L arm Pain radiates to the back: no   Pain severity:  Moderate Onset quality:  Gradual Duration:  4 days Timing:  Intermittent Progression:  Waxing and waning Chronicity:  New Context: breathing   Context: no trauma   Relieved by:  Nitroglycerin Worsened by:  Deep breathing and movement Ineffective treatments:  None tried Associated symptoms: anxiety, nausea and shortness of breath   Associated symptoms: no abdominal pain, no back pain, no cough, no diaphoresis, no dizziness, no fatigue, no fever, no headache, no lower extremity edema, no near-syncope, no numbness, no palpitations, no syncope, not vomiting and no weakness   Nausea:    Severity:  Mild   Onset quality:  Gradual   Timing:  Intermittent   Progression:  Resolved Shortness of breath:    Severity:  Mild   Onset quality:  Gradual   Timing:  Intermittent   Progression:  Waxing and waning Risk factors: hypertension and smoking   Risk factors: no coronary artery disease, not female and no prior DVT/PE     Past Medical History  Diagnosis Date  . Breast abscess 10/2011    left breast  . Chest pain     last chest pain 7-15 2013 admit to [facility]c for 3 days  . Depression   . Shortness of breath   . Neuromuscular disorder     bells palsy  . Anxiety   . Bell's palsy 1980's    left side  . Headache(784.0)     migraine  . Arthritis     shoulders and hands  . Vertigo   . Sleep apnea 2007    no cpap  given to use per pt   Past Surgical History  Procedure Laterality Date  . Left breast abscess i&d  10/2011  . Uterine fibroid surgery  1986  . Abdominal surgery for scar tissue  2007 or 2008  . Breast cyst excision  12/15/2011    Procedure: CYST EXCISION BREAST;  Surgeon: Shelly Rubensteinouglas A Blackman, MD;  Location: [facility];  Service: General;  Laterality: Left;  Excision of a Chronic Left Breast Sebaceous Cyst   Family History  Problem Relation Age of Onset  . Cancer Mother     Stomach  . Cancer Father     Unknown   History  Substance Use Topics  . Smoking status: Current Every Day Smoker -- 0.25 packs/day for 30 years    Types: Cigarettes  . Smokeless tobacco: Never Used     Comment: quitting on her own "    QUITTING ON HER OWN "  . Alcohol Use: No   OB History    No data available     Review of Systems  Constitutional: Negative for fever, chills, diaphoresis, activity change and fatigue.  HENT: Negative for congestion and rhinorrhea.   Eyes: Negative for visual disturbance.  Respiratory: Positive for shortness of breath. Negative for cough, choking, chest tightness, wheezing and stridor.   Cardiovascular: Positive for  chest pain. Negative for palpitations, leg swelling, syncope and near-syncope.  Gastrointestinal: Positive for nausea. Negative for vomiting, abdominal pain, diarrhea and constipation.  Genitourinary: Negative for dysuria.  Musculoskeletal: Negative for back pain.  Skin: Negative for rash and wound.  Neurological: Negative for dizziness, weakness, numbness and headaches.  All other systems reviewed and are negative.     Allergies  Sulfa antibiotics  Home Medications   Prior to Admission medications   Medication Sig Start Date End Date Taking? Authorizing Provider  dicyclomine (BENTYL) 10 MG capsule Take 10 mg by mouth 3 (three) times daily.   Yes Historical Provider, MD  meclizine (ANTIVERT) 25 MG tablet Take 25 mg by mouth 3 (three) times daily.   Yes  Historical Provider, MD  nitroGLYCERIN (NITROSTAT) 0.4 MG SL tablet Place 1 tablet (0.4 mg total) under the tongue every 5 (five) minutes x 3 doses as needed for chest pain. 12/29/12  Yes [name], MD  adalimumab (HUMIRA) 40 MG/0.8ML injection Inject 40 mg into the skin every 14 (fourteen) days. Every other Wednesday, last injection 12/20/12    Historical Provider, MD  cholecalciferol (VITAMIN D) 1000 UNITS tablet Take 1,000 Units by mouth daily.    Historical Provider, MD  HYDROcodone-acetaminophen (NORCO/VICODIN) 5-325 MG per tablet Take 1 tablet by mouth daily as needed for pain.    Historical Provider, MD  Multiple Vitamin (MULTIVITAMIN WITH MINERALS) TABS tablet Take 1 tablet by mouth daily.    Historical Provider, MD  PRESCRIPTION MEDICATION Apply 1 application topically at bedtime. Psoriasis cream compounded by [facility]:  Salicylic acid (petrolatum) 30% ointment    Historical Provider, MD   BP 108/58 mmHg  Pulse 101  Temp(Src) 99.2 F (37.3 C) (Oral)  Resp 17  Wt 140 lb (63.504 kg)  SpO2 100% Physical Exam  Constitutional: She is oriented to person, place, and time. She appears well-developed. No distress.  HENT:  Head: Normocephalic.  Mouth/Throat: No oropharyngeal exudate.  Eyes: Conjunctivae and EOM are normal. Pupils are equal, round, and reactive to light.  Neck: Normal range of motion.  Cardiovascular: Regular rhythm, normal heart sounds and intact distal pulses.  Tachycardia present.   No murmur heard.   Pulmonary/Chest: Effort normal and breath sounds normal. No stridor. No respiratory distress. She has no wheezes. She has no rales. She exhibits tenderness.  Abdominal: Soft. Bowel sounds are normal. She exhibits no distension. There is no tenderness. There is no rebound and no guarding.  Musculoskeletal: She exhibits no tenderness.  Neurological: She is alert and oriented to person, place, and time. No cranial nerve deficit. She exhibits normal muscle tone.    Skin: Skin is warm. She is not diaphoretic. No pallor.  Nursing note and vitals reviewed.   ED Course  Procedures (including critical care time) Labs Review Labs Reviewed  CBC - Abnormal; Notable for the following:    WBC 12.3 (*)    All other components within normal limits  BASIC METABOLIC PANEL - Abnormal; Notable for the following:    Chloride 112 (*)    CO2 19 (*)    Glucose, Bld 119 (*)    Creatinine, Ser 1.84 (*)    GFR calc non Af Amer 29 (*)    GFR calc Af Amer 34 (*)    All other components within normal limits  I-STAT TROPOININ, ED    Imaging Review Dg Chest 2 View  09/05/2014   CLINICAL DATA:  Chest pain and anxiety for 5 days  EXAM: CHEST  2 VIEW  COMPARISON:  04/03/2014  FINDINGS: Subtle density at the medial right base is stable from prior. There is no edema, consolidation, effusion, or pneumothorax. Normal heart size and mediastinal contours. No osseous findings to explain acute chest pain.  IMPRESSION: Stable exam.  No acute cardiopulmonary disease.   Electronically Signed   By: Marnee SpringJonathon  Watts M.D.   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 The pt reports that her symptoms have been intermittent for the last 4 days with a sharp and pleuritic chest pain that radiates to her L arm. The pt reports the symptoms lasting one hour at a time. The pt reports associated  shortness of breath, nausea, and tenderness to her chest. The pt reports her pain has improved upon arrival. She was given an aspirin and SL nitro which helped her symptoms.   Initial ddx includes PE, ACS, MSK chest pain, and anxiety. The pt is followed by a cardiologist and had a stress test that was negative in 2014. The pt was tachycardic on arrival. Given hx and exam, the pt had labsm cxr, and EKG ordered.   The diagnostic testing results are seen above. The EKG appeared similar to prior to no evidence of acute ischemia. The D-Dimer was negative making PE unlikely. She had a mild leukocytosis and her BMP appeared similar to prior. The troponins were negative times 2.   The patient's cardiologist was called for management recommendations. The Cardiology team recommended discharge with follow up in the AM for further workup and management of her chest pain. On reassessment, the pt reported resolution of her chest pain. The pt requested a GC/chlamydia urine test as she was sexually active last month. The pt denied GU symptoms.   The pt voiced understanding of the plan of care for discharge and follow up in the AM. The pt was given return precautions for new or worsening symptoms. The pt had no other questions, concerns or complaints and was discharged in good condition.    This patient was seen with Dr. Preston FleetingGlick, emergency medicine attending.       Theda Belfasthris Nicolette Gieske, MD 09/06/14 [id number]  Dione Boozeavid Glick, MD 09/07/14 [phone]

## 2014-12-23 ENCOUNTER — Other Ambulatory Visit: Payer: Self-pay

## 2014-12-23 DIAGNOSIS — Z1231 Encounter for screening mammogram for malignant neoplasm of breast: Secondary | ICD-10-CM

## 2014-12-27 ENCOUNTER — Ambulatory Visit
Admission: RE | Admit: 2014-12-27 | Discharge: 2014-12-27 | Disposition: A | Discharge status: Home / Self Care | Payer: Medicare Other | Source: Ambulatory Visit

## 2014-12-27 DIAGNOSIS — Z1231 Encounter for screening mammogram for malignant neoplasm of breast: Secondary | ICD-10-CM

## 2014-12-30 ENCOUNTER — Other Ambulatory Visit: Payer: Self-pay | Admitting: Cardiovascular Disease

## 2014-12-30 DIAGNOSIS — R928 Other abnormal and inconclusive findings on diagnostic imaging of breast: Secondary | ICD-10-CM

## 2015-01-01 ENCOUNTER — Other Ambulatory Visit: Payer: Medicare Other

## 2015-01-07 ENCOUNTER — Other Ambulatory Visit: Payer: Medicare Other

## 2015-02-21 ENCOUNTER — Ambulatory Visit
Admission: RE | Admit: 2015-02-21 | Discharge: 2015-02-21 | Disposition: A | Discharge status: Home / Self Care | Payer: Medicare Other | Source: Ambulatory Visit | Attending: Cardiovascular Disease | Admitting: Cardiovascular Disease

## 2015-02-21 DIAGNOSIS — R928 Other abnormal and inconclusive findings on diagnostic imaging of breast: Secondary | ICD-10-CM

## 2015-09-08 ENCOUNTER — Observation Stay (HOSPITAL_COMMUNITY)
Admission: EM | Admit: 2015-09-08 | Discharge: 2015-09-09 | Disposition: A | Discharge status: Home / Self Care | Payer: Medicare Other | Attending: Urology | Admitting: Urology

## 2015-09-08 ENCOUNTER — Encounter (HOSPITAL_COMMUNITY): Payer: Self-pay | Admitting: Anesthesiology

## 2015-09-08 ENCOUNTER — Emergency Department (HOSPITAL_COMMUNITY): Payer: Medicare Other

## 2015-09-08 ENCOUNTER — Encounter (HOSPITAL_COMMUNITY)
Admission: EM | Disposition: A | Discharge status: Home / Self Care | Payer: Self-pay | Source: Home / Self Care | Attending: Emergency Medicine

## 2015-09-08 ENCOUNTER — Observation Stay (HOSPITAL_COMMUNITY): Payer: Medicare Other | Admitting: Anesthesiology

## 2015-09-08 DIAGNOSIS — N200 Calculus of kidney: Secondary | ICD-10-CM | POA: Diagnosis present

## 2015-09-08 DIAGNOSIS — N179 Acute kidney failure, unspecified: Secondary | ICD-10-CM

## 2015-09-08 DIAGNOSIS — N201 Calculus of ureter: Secondary | ICD-10-CM

## 2015-09-08 DIAGNOSIS — N139 Obstructive and reflux uropathy, unspecified: Secondary | ICD-10-CM

## 2015-09-08 DIAGNOSIS — N136 Pyonephrosis: Principal | ICD-10-CM | POA: Diagnosis present

## 2015-09-08 DIAGNOSIS — F17210 Nicotine dependence, cigarettes, uncomplicated: Secondary | ICD-10-CM | POA: Diagnosis not present

## 2015-09-08 DIAGNOSIS — G4730 Sleep apnea, unspecified: Secondary | ICD-10-CM | POA: Diagnosis not present

## 2015-09-08 DIAGNOSIS — F329 Major depressive disorder, single episode, unspecified: Secondary | ICD-10-CM | POA: Insufficient documentation

## 2015-09-08 DIAGNOSIS — F419 Anxiety disorder, unspecified: Secondary | ICD-10-CM | POA: Diagnosis not present

## 2015-09-08 DIAGNOSIS — N189 Chronic kidney disease, unspecified: Secondary | ICD-10-CM | POA: Diagnosis not present

## 2015-09-08 DIAGNOSIS — N289 Disorder of kidney and ureter, unspecified: Secondary | ICD-10-CM

## 2015-09-08 DIAGNOSIS — F17200 Nicotine dependence, unspecified, uncomplicated: Secondary | ICD-10-CM | POA: Diagnosis not present

## 2015-09-08 DIAGNOSIS — R109 Unspecified abdominal pain: Secondary | ICD-10-CM

## 2015-09-08 DIAGNOSIS — R1084 Generalized abdominal pain: Secondary | ICD-10-CM

## 2015-09-08 HISTORY — PX: CYSTOSCOPY WITH STENT PLACEMENT: SHX5790

## 2015-09-08 LAB — URINE MICROSCOPIC-ADD ON: WBC, UA: NONE SEEN WBC/hpf (ref 0–5)

## 2015-09-08 LAB — CBC
HCT: 43.8 % (ref 36.0–46.0)
Hemoglobin: 14.1 g/dL (ref 12.0–15.0)
MCH: 28.9 pg (ref 26.0–34.0)
MCHC: 32.2 g/dL (ref 30.0–36.0)
MCV: 89.8 fL (ref 78.0–100.0)
Platelets: 285 10*3/uL (ref 150–400)
RBC: 4.88 MIL/uL (ref 3.87–5.11)
RDW: 12.9 % (ref 11.5–15.5)
WBC: 14.8 10*3/uL — ABNORMAL HIGH (ref 4.0–10.5)

## 2015-09-08 LAB — COMPREHENSIVE METABOLIC PANEL
ALT: 20 U/L (ref 14–54)
AST: 25 U/L (ref 15–41)
Albumin: 4 g/dL (ref 3.5–5.0)
Alkaline Phosphatase: 82 U/L (ref 38–126)
Anion gap: 13 (ref 5–15)
BUN: 12 mg/dL (ref 6–20)
CO2: 20 mmol/L — ABNORMAL LOW (ref 22–32)
Calcium: 9.3 mg/dL (ref 8.9–10.3)
Chloride: 105 mmol/L (ref 101–111)
Creatinine, Ser: 2.73 mg/dL — ABNORMAL HIGH (ref 0.44–1.00)
GFR calc Af Amer: 21 mL/min — ABNORMAL LOW (ref >60)
GFR calc non Af Amer: 18 mL/min — ABNORMAL LOW (ref >60)
Glucose, Bld: 150 mg/dL — ABNORMAL HIGH (ref 65–99)
Potassium: 4.4 mmol/L (ref 3.5–5.1)
Sodium: 138 mmol/L (ref 135–145)
Total Bilirubin: 0.9 mg/dL (ref 0.3–1.2)
Total Protein: 7.9 g/dL (ref 6.5–8.1)

## 2015-09-08 LAB — URINALYSIS, ROUTINE W REFLEX MICROSCOPIC
Bilirubin Urine: NEGATIVE
Glucose, UA: NEGATIVE mg/dL
Ketones, ur: NEGATIVE mg/dL
Leukocytes, UA: NEGATIVE
Nitrite: NEGATIVE
Protein, ur: 30 mg/dL — AB
Specific Gravity, Urine: 1.008 (ref 1.005–1.030)
pH: 6 (ref 5.0–8.0)

## 2015-09-08 LAB — LIPASE, BLOOD: Lipase: 35 U/L (ref 11–51)

## 2015-09-08 LAB — I-STAT CG4 LACTIC ACID, ED: Lactic Acid, Venous: 1.95 mmol/L (ref 0.5–2.0)

## 2015-09-08 LAB — I-STAT TROPONIN, ED: Troponin i, poc: 0 ng/mL (ref 0.00–0.08)

## 2015-09-08 SURGERY — CYSTOSCOPY, WITH STENT INSERTION
Anesthesia: General | Laterality: Left

## 2015-09-08 MED ORDER — ASPIRIN 81 MG PO CHEW
81.0000 mg | CHEWABLE_TABLET | Freq: Every day | ORAL | Status: DC
  Administered 2015-09-09: 81 mg via ORAL
  Filled 2015-09-08: qty 1

## 2015-09-08 MED ORDER — HYDROMORPHONE HCL 1 MG/ML IJ SOLN
1.0000 mg | INTRAMUSCULAR | Status: DC | PRN
  Administered 2015-09-08: 1 mg via INTRAVENOUS
  Filled 2015-09-08: qty 1

## 2015-09-08 MED ORDER — SODIUM CHLORIDE 0.9 % IV BOLUS (SEPSIS)
1000.0000 mL | Freq: Once | INTRAVENOUS | Status: AC
  Administered 2015-09-08: 1000 mL via INTRAVENOUS

## 2015-09-08 MED ORDER — BISACODYL 10 MG RE SUPP: 10.0000 mg | Freq: Every day | RECTAL | Status: DC | PRN

## 2015-09-08 MED ORDER — ADALIMUMAB 40 MG/0.8ML ~~LOC~~ AJKT: 40.0000 mg | AUTO-INJECTOR | SUBCUTANEOUS | Status: DC

## 2015-09-08 MED ORDER — MORPHINE SULFATE (PF) 4 MG/ML IV SOLN
4.0000 mg | Freq: Once | INTRAVENOUS | Status: AC
  Administered 2015-09-08: 4 mg via INTRAVENOUS
  Filled 2015-09-08: qty 1

## 2015-09-08 MED ORDER — LIDOCAINE HCL (CARDIAC) 20 MG/ML IV SOLN
INTRAVENOUS | Status: DC | PRN
  Administered 2015-09-08: 100 mg via INTRAVENOUS

## 2015-09-08 MED ORDER — LIDOCAINE HCL (CARDIAC) 20 MG/ML IV SOLN
INTRAVENOUS | Status: AC
  Filled 2015-09-08: qty 5

## 2015-09-08 MED ORDER — AMLODIPINE BESYLATE 2.5 MG PO TABS
2.5000 mg | ORAL_TABLET | Freq: Every day | ORAL | Status: DC
  Administered 2015-09-09: 2.5 mg via ORAL
  Filled 2015-09-08: qty 1

## 2015-09-08 MED ORDER — ONDANSETRON HCL 4 MG/2ML IJ SOLN
INTRAMUSCULAR | Status: AC
  Filled 2015-09-08: qty 2

## 2015-09-08 MED ORDER — FENTANYL CITRATE (PF) 100 MCG/2ML IJ SOLN
INTRAMUSCULAR | Status: AC
  Filled 2015-09-08: qty 2

## 2015-09-08 MED ORDER — FENTANYL CITRATE (PF) 100 MCG/2ML IJ SOLN: 25.0000 ug | INTRAMUSCULAR | Status: DC | PRN

## 2015-09-08 MED ORDER — HEPARIN SODIUM (PORCINE) 5000 UNIT/ML IJ SOLN
5000.0000 [IU] | Freq: Three times a day (TID) | INTRAMUSCULAR | Status: DC
  Administered 2015-09-09 (×2): 5000 [IU] via SUBCUTANEOUS
  Filled 2015-09-08 (×3): qty 1

## 2015-09-08 MED ORDER — VENLAFAXINE HCL 75 MG PO TABS
75.0000 mg | ORAL_TABLET | Freq: Every day | ORAL | Status: DC
  Filled 2015-09-08 (×2): qty 1

## 2015-09-08 MED ORDER — DEXTROSE 5 % IV SOLN
1.0000 g | INTRAVENOUS | Status: AC
  Administered 2015-09-08: 2 g via INTRAVENOUS

## 2015-09-08 MED ORDER — OXYCODONE HCL 5 MG PO TABS: 15.0000 mg | ORAL_TABLET | ORAL | Status: DC | PRN

## 2015-09-08 MED ORDER — COAL TAR EXTRACT 2 % EX OINT: 1.0000 "application " | TOPICAL_OINTMENT | Freq: Every day | CUTANEOUS | Status: DC

## 2015-09-08 MED ORDER — DIPHENHYDRAMINE HCL 50 MG/ML IJ SOLN: 12.5000 mg | Freq: Four times a day (QID) | INTRAMUSCULAR | Status: DC | PRN

## 2015-09-08 MED ORDER — DICYCLOMINE HCL 10 MG PO CAPS
10.0000 mg | ORAL_CAPSULE | Freq: Two times a day (BID) | ORAL | Status: DC
  Administered 2015-09-09 (×2): 10 mg via ORAL
  Filled 2015-09-08 (×2): qty 1

## 2015-09-08 MED ORDER — DIPHENHYDRAMINE HCL 12.5 MG/5ML PO ELIX: 12.5000 mg | ORAL_SOLUTION | Freq: Four times a day (QID) | ORAL | Status: DC | PRN

## 2015-09-08 MED ORDER — OLOPATADINE HCL 0.1 % OP SOLN
1.0000 [drp] | Freq: Two times a day (BID) | OPHTHALMIC | Status: DC
  Administered 2015-09-09: 1 [drp] via OPHTHALMIC
  Filled 2015-09-08: qty 5

## 2015-09-08 MED ORDER — DEXTROSE 5 % IV SOLN
INTRAVENOUS | Status: AC
  Filled 2015-09-08: qty 2

## 2015-09-08 MED ORDER — PHENYLEPHRINE 40 MCG/ML (10ML) SYRINGE FOR IV PUSH (FOR BLOOD PRESSURE SUPPORT)
PREFILLED_SYRINGE | INTRAVENOUS | Status: AC
  Filled 2015-09-08: qty 10

## 2015-09-08 MED ORDER — DEXTROSE 5 % IV SOLN
1.0000 g | INTRAVENOUS | Status: DC
  Filled 2015-09-08: qty 10

## 2015-09-08 MED ORDER — DIATRIZOATE MEGLUMINE 30 % UR SOLN
URETHRAL | Status: AC
  Filled 2015-09-08: qty 300

## 2015-09-08 MED ORDER — HYDROMORPHONE HCL 1 MG/ML IJ SOLN: 0.5000 mg | INTRAMUSCULAR | Status: DC | PRN

## 2015-09-08 MED ORDER — NITROGLYCERIN 0.4 MG SL SUBL
0.4000 mg | SUBLINGUAL_TABLET | SUBLINGUAL | Status: DC | PRN
Start: 2015-09-08 — End: 2015-09-09

## 2015-09-08 MED ORDER — BARIUM SULFATE 2.1 % PO SUSP: 450.0000 mL | Freq: Once | ORAL | Status: DC

## 2015-09-08 MED ORDER — DEXAMETHASONE SODIUM PHOSPHATE 10 MG/ML IJ SOLN
INTRAMUSCULAR | Status: AC
  Filled 2015-09-08: qty 1

## 2015-09-08 MED ORDER — FENTANYL CITRATE (PF) 100 MCG/2ML IJ SOLN
INTRAMUSCULAR | Status: DC | PRN
  Administered 2015-09-08: 50 ug via INTRAVENOUS

## 2015-09-08 MED ORDER — ZOLPIDEM TARTRATE 5 MG PO TABS: 5.0000 mg | ORAL_TABLET | Freq: Every evening | ORAL | Status: DC | PRN

## 2015-09-08 MED ORDER — ACITRETIN 25 MG PO CAPS: 25.0000 mg | ORAL_CAPSULE | Freq: Every day | ORAL | Status: DC

## 2015-09-08 MED ORDER — OXYBUTYNIN CHLORIDE 5 MG PO TABS: 5.0000 mg | ORAL_TABLET | Freq: Three times a day (TID) | ORAL | Status: DC | PRN

## 2015-09-08 MED ORDER — HALOBETASOL PROPIONATE 0.05 % EX CREA: 1.0000 "application " | TOPICAL_CREAM | Freq: Two times a day (BID) | CUTANEOUS | Status: DC | PRN

## 2015-09-08 MED ORDER — SODIUM CHLORIDE 0.9 % IR SOLN
Status: DC | PRN
  Administered 2015-09-08: 3000 mL

## 2015-09-08 MED ORDER — ALBUTEROL SULFATE (2.5 MG/3ML) 0.083% IN NEBU: 3.0000 mL | INHALATION_SOLUTION | Freq: Four times a day (QID) | RESPIRATORY_TRACT | Status: DC | PRN

## 2015-09-08 MED ORDER — DEXAMETHASONE SODIUM PHOSPHATE 10 MG/ML IJ SOLN
INTRAMUSCULAR | Status: DC | PRN
  Administered 2015-09-08: 10 mg via INTRAVENOUS

## 2015-09-08 MED ORDER — PROPOFOL 10 MG/ML IV BOLUS
INTRAVENOUS | Status: AC
  Filled 2015-09-08: qty 20

## 2015-09-08 MED ORDER — ONDANSETRON HCL 4 MG/2ML IJ SOLN
INTRAMUSCULAR | Status: DC | PRN
  Administered 2015-09-08: 4 mg via INTRAVENOUS

## 2015-09-08 MED ORDER — BARIUM SULFATE 2.1 % PO SUSP
ORAL | Status: AC
  Filled 2015-09-08: qty 2

## 2015-09-08 MED ORDER — DIATRIZOATE MEGLUMINE 30 % UR SOLN
URETHRAL | Status: DC | PRN
  Administered 2015-09-08: 10 mL

## 2015-09-08 MED ORDER — HYDROMORPHONE HCL 1 MG/ML IJ SOLN
1.0000 mg | Freq: Once | INTRAMUSCULAR | Status: AC
  Administered 2015-09-08: 1 mg via INTRAVENOUS
  Filled 2015-09-08: qty 1

## 2015-09-08 MED ORDER — ONDANSETRON HCL 4 MG/2ML IJ SOLN
4.0000 mg | Freq: Once | INTRAMUSCULAR | Status: AC
  Administered 2015-09-08: 4 mg via INTRAVENOUS
  Filled 2015-09-08: qty 2

## 2015-09-08 MED ORDER — ASPIRIN 81 MG PO CHEW
243.0000 mg | CHEWABLE_TABLET | Freq: Once | ORAL | Status: AC
  Administered 2015-09-08: 243 mg via ORAL
  Filled 2015-09-08: qty 3

## 2015-09-08 MED ORDER — PHENYLEPHRINE HCL 10 MG/ML IJ SOLN
INTRAMUSCULAR | Status: DC | PRN
  Administered 2015-09-08: 80 ug via INTRAVENOUS

## 2015-09-08 MED ORDER — ALPRAZOLAM 0.25 MG PO TABS
0.2500 mg | ORAL_TABLET | Freq: Every morning | ORAL | Status: DC
  Administered 2015-09-09: 0.25 mg via ORAL
  Filled 2015-09-08: qty 1

## 2015-09-08 MED ORDER — PROPOFOL 10 MG/ML IV BOLUS
INTRAVENOUS | Status: DC | PRN
  Administered 2015-09-08: 170 mg via INTRAVENOUS

## 2015-09-08 MED ORDER — SODIUM CHLORIDE 0.9 % IV SOLN
INTRAVENOUS | Status: DC | PRN
  Administered 2015-09-08: 22:00:00 via INTRAVENOUS

## 2015-09-08 MED ORDER — ISOSORBIDE MONONITRATE 15 MG HALF TABLET
15.0000 mg | ORAL_TABLET | Freq: Every day | ORAL | Status: DC | PRN
  Filled 2015-09-08: qty 2

## 2015-09-08 MED ORDER — KETOROLAC TROMETHAMINE 30 MG/ML IJ SOLN: 30.0000 mg | Freq: Once | INTRAMUSCULAR | Status: DC | PRN

## 2015-09-08 MED ORDER — ACETAMINOPHEN 325 MG PO TABS: 650.0000 mg | ORAL_TABLET | ORAL | Status: DC | PRN

## 2015-09-08 MED ORDER — ONDANSETRON HCL 4 MG/2ML IJ SOLN: 4.0000 mg | INTRAMUSCULAR | Status: DC | PRN

## 2015-09-08 MED ORDER — MECLIZINE HCL 25 MG PO TABS
25.0000 mg | ORAL_TABLET | Freq: Three times a day (TID) | ORAL | Status: DC
  Administered 2015-09-09 (×2): 25 mg via ORAL
  Filled 2015-09-08 (×3): qty 1

## 2015-09-08 MED ORDER — ONDANSETRON HCL 4 MG/2ML IJ SOLN
4.0000 mg | Freq: Three times a day (TID) | INTRAMUSCULAR | Status: DC | PRN
  Administered 2015-09-08: 4 mg via INTRAVENOUS
  Filled 2015-09-08: qty 2

## 2015-09-08 MED ORDER — KCL IN DEXTROSE-NACL 20-5-0.45 MEQ/L-%-% IV SOLN
INTRAVENOUS | Status: DC
  Administered 2015-09-09: via INTRAVENOUS
  Filled 2015-09-08 (×2): qty 1000

## 2015-09-08 MED ORDER — VENLAFAXINE HCL 75 MG PO TABS
75.0000 mg | ORAL_TABLET | Freq: Every evening | ORAL | Status: DC
  Filled 2015-09-08: qty 1

## 2015-09-08 SURGICAL SUPPLY — 10 items
BAG URO CATCHER STRL LF (MISCELLANEOUS) ×2 IMPLANT
CATH URET 5FR 28IN OPEN ENDED (CATHETERS) IMPLANT
CLOTH BEACON ORANGE TIMEOUT ST (SAFETY) ×2 IMPLANT
GLOVE SURG SS PI 8.0 STRL IVOR (GLOVE) ×1 IMPLANT
GOWN STRL REUS W/TWL XL LVL3 (GOWN DISPOSABLE) ×2 IMPLANT
GUIDEWIRE STR DUAL SENSOR (WIRE) ×2 IMPLANT
MANIFOLD NEPTUNE II (INSTRUMENTS) ×2 IMPLANT
PACK CYSTO (CUSTOM PROCEDURE TRAY) ×2 IMPLANT
STENT CONTOUR 6FRX24X.038 (STENTS) ×1 IMPLANT
TUBING CONNECTING 10 (TUBING) ×2 IMPLANT

## 2015-09-08 NOTE — Anesthesia Preprocedure Evaluation (Signed)
Anesthesia Evaluation  Patient identified by MRN, date of birth, ID band Patient awake    Reviewed: Allergy & Precautions, NPO status , Patient's Chart, lab work & pertinent test results  Airway Mallampati: II  TM Distance: >3 FB Neck ROM: Full    Dental no notable dental hx.    Pulmonary sleep apnea , Current Smoker,    Pulmonary exam normal breath sounds clear to auscultation       Cardiovascular negative cardio ROS Normal cardiovascular exam Rhythm:Regular Rate:Normal     Neuro/Psych negative neurological ROS  negative psych ROS   GI/Hepatic negative GI ROS, Neg liver ROS,   Endo/Other  negative endocrine ROS  Renal/GU negative Renal ROS  negative genitourinary   Musculoskeletal negative musculoskeletal ROS (+)   Abdominal   Peds negative pediatric ROS (+)  Hematology negative hematology ROS (+)   Anesthesia Other Findings   Reproductive/Obstetrics negative OB ROS                             Anesthesia Physical Anesthesia Plan  ASA: II  Anesthesia Plan: General   Post-op Pain Management:    Induction: Intravenous  Airway Management Planned: LMA  Additional Equipment:   Intra-op Plan:   Post-operative Plan: Extubation in OR  Informed Consent: I have reviewed the patients History and Physical, chart, labs and discussed the procedure including the risks, benefits and alternatives for the proposed anesthesia with the patient or authorized representative who has indicated his/her understanding and acceptance.   Dental advisory given  Plan Discussed with: CRNA and Surgeon  Anesthesia Plan Comments:         Anesthesia Quick Evaluation

## 2015-09-08 NOTE — Anesthesia Procedure Notes (Signed)
Procedure Name: LMA Insertion Date/Time: 09/08/2015 10:16 PM Performed by: Leroy LibmanEARDON, Aivy Akter L Patient Re-evaluated:Patient Re-evaluated prior to inductionOxygen Delivery Method: Circle system utilized Preoxygenation: Pre-oxygenation with 100% oxygen Intubation Type: IV induction Ventilation: Mask ventilation without difficulty LMA: LMA inserted LMA Size: 4.0 Number of attempts: 1 Placement Confirmation: positive ETCO2 and breath sounds checked- equal and bilateral Tube secured with: Tape Dental Injury: Teeth and Oropharynx as per pre-operative assessment

## 2015-09-08 NOTE — Brief Op Note (Signed)
09/08/2015  10:39 PM  PATIENT:  Whitney Parrish  60 y.o. female  PRE-OPERATIVE DIAGNOSIS:  left proximal ureteral stone  POST-OPERATIVE DIAGNOSIS:  left proximal ureteral stone  PROCEDURE:  Procedure(s): CYSTOSCOPY WITH STENT PLACEMENT (Left)  Left Retrograde pyelogram with interp.   SURGEON:  Surgeon(s) and Role:    * Bjorn PippinJohn Tesla Bochicchio, MD - Primary  PHYSICIAN ASSISTANT:   ASSISTANTS: none   ANESTHESIA:   general  EBL:     BLOOD ADMINISTERED:none  DRAINS: 6 x 24 JJ stent left.    LOCAL MEDICATIONS USED:  NONE  SPECIMEN:  Source of Specimen:  urine  DISPOSITION OF SPECIMEN:  PATHOLOGY  COUNTS:  YES  TOURNIQUET:  * No tourniquets in log *  DICTATION: .Other Dictation: Dictation Number [id number]  PLAN OF CARE: Admit for overnight observation  PATIENT DISPOSITION:  PACU - hemodynamically stable.   Delay start of Pharmacological VTE agent (>24hrs) due to surgical blood loss or risk of bleeding: not applicable

## 2015-09-08 NOTE — H&P (Signed)
Subjective: CC: Left flank pain.  Hx: Whitney Parrish is a 60 yo female who was transferred from [facility] with a 7mm left proximal stone with obstruction.   Her pain has been poorly controlled and she had a low grade fever with a leukocytosis and AKI with a Cr of 2.73.  Her baseline is elevated at 1.8 and she has some chronic dilation of the right collecting system as well.   She had onset of the pain yesterday and had nausea with the pain but no hematuria or voiding complaints.   Her UA is unremarkable.   He lactate is borderline at 1.95.   She has been told she had stone in the past but has not seen a urologist and denies GU surgery.   She has Psoriasis and is on Humira for that.  She has a history of CP and is on chronic nitrates but her EKG is stable and she has no chest pain at this time.  ROS:  Review of Systems  Constitutional: Negative for fever and chills.  Respiratory: Negative for shortness of breath.   Cardiovascular: Negative for chest pain, palpitations and leg swelling.  Gastrointestinal: Positive for nausea, abdominal pain and constipation (she generally has frequent stools but none in 2 days. ). Negative for diarrhea.  Genitourinary: Positive for flank pain. Negative for dysuria, urgency, frequency and hematuria.  Musculoskeletal: Negative.   Skin: Positive for rash (psoriasis).  Neurological: Positive for focal weakness (left sided Bell's palsy).  Endo/Heme/Allergies: Negative for environmental allergies. Does not bruise/bleed easily.  All other systems reviewed and are negative.   Allergies  Allergen Reactions  . Sulfa Antibiotics Other (See Comments)    hallucinations    Past Medical History  Diagnosis Date  . Breast abscess 10/2011    left breast  . Chest pain     last chest pain 7-15 2013 admit to mc for 3 days  . Depression   . Shortness of breath   . Neuromuscular disorder     bells palsy  . Anxiety   . Bell's palsy 1980's    left side  . Headache(784.0)      migraine  . Arthritis     shoulders and hands  . Vertigo   . Sleep apnea 2007    no cpap given to use per pt    Past Surgical History  Procedure Laterality Date  . Left breast abscess i&d  10/2011  . Uterine fibroid surgery  1986  . Abdominal surgery for scar tissue  2007 or 2008  . Breast cyst excision  12/15/2011    Procedure: CYST EXCISION BREAST;  Surgeon: Shelly Rubenstein, MD;  Location: [facility];  Service: General;  Laterality: Left;  Excision of a Chronic Left Breast Sebaceous Cyst    Social History   Social History  . Marital Status: Divorced    Spouse Name: N/A  . Number of Children: N/A  . Years of Education: N/A   Occupational History  . Not on file.   Social History Main Topics  . Smoking status: Current Every Day Smoker -- 0.25 packs/day for 30 years    Types: Cigarettes  . Smokeless tobacco: Never Used     Comment: quitting on her own "    QUITTING ON HER OWN "  . Alcohol Use: No  . Drug Use: No  . Sexual Activity: Not Currently    Birth Control/ Protection: Post-menopausal   Other Topics Concern  . Not on file   Social  History Narrative    Family History  Problem Relation Age of Onset  . Cancer Mother     Stomach  . Cancer Father     Unknown   Past, family and social history reviewed.  Med list reviewed in Chart review but home meds have not been reconciled.  Anti-infectives: Anti-infectives    Start     Dose/Rate Route Frequency Ordered Stop   09/08/15 2026  cefTRIAXone (ROCEPHIN) 1 g in dextrose 5 % 50 mL IVPB     1 g 100 mL/hr over 30 Minutes Intravenous 30 min pre-op 09/08/15 2026        Current Facility-Administered Medications  Medication Dose Route Frequency Provider Last Rate Last Dose  . Barium Sulfate 2.1 % SUSP 450 mL  450 mL Oral Once Whitney AutomotiveShawn C Parrish, Whitney Parrish      . Barium Sulfate 2.1 % SUSP           . cefTRIAXone (ROCEPHIN) 1 g in dextrose 5 % 50 mL IVPB  1 g Intravenous 30 min Pre-Op Whitney PippinJohn Rei Contee, MD      . HYDROmorphone  (DILAUDID) injection 1 mg  1 mg Intravenous Q4H PRN Whitney CaptainAbigail Harris, Whitney Parrish   1 mg at 09/08/15 1951  . ondansetron (ZOFRAN) injection 4 mg  4 mg Intravenous Q8H PRN Whitney CaptainAbigail Harris, Whitney Parrish   4 mg at 09/08/15 1951     Objective: Vital signs in last 24 hours: Temp:  [97.6 F (36.4 C)-99.4 F (37.4 C)] 97.6 F (36.4 C) (05/08 2055) Pulse Rate:  [80-111] 81 (05/08 2055) Resp:  [14-21] 17 (05/08 1915) BP: (132-174)/(83-120) 160/89 mmHg (05/08 2055) SpO2:  [92 %-100 %] 98 % (05/08 2055) Weight:  [64.955 kg (143 lb 3.2 oz)-66.225 kg (146 lb)] 64.955 kg (143 lb 3.2 oz) (05/08 2055)  Intake/Output from previous day:   Intake/Output this shift:     Physical Exam  Constitutional: She is oriented to person, place, and time and well-developed, well-nourished, and in no distress.  HENT:  Head: Normocephalic and atraumatic.  Neck: Normal range of motion. Neck supple. No thyromegaly present.  Cardiovascular: Normal rate, regular rhythm and normal heart sounds.   Pulmonary/Chest: Effort normal and breath sounds normal. No respiratory distress.  Abdominal: Soft. She exhibits no mass. There is tenderness (diffuse, left greater than right with LCVAT. ). There is guarding.  Musculoskeletal: Normal range of motion. She exhibits no edema or tenderness.  Lymphadenopathy:    She has no cervical adenopathy.  Neurological: She is alert and oriented to person, place, and time.  Skin: Skin is warm and dry. Rash (psoriasis on her elbows and legs) noted.  Psychiatric: Mood and affect normal.  Vitals reviewed.   Lab Results:   Recent Labs  09/08/15 1057  WBC 14.8*  HGB 14.1  HCT 43.8  PLT 285   BMET  Recent Labs  09/08/15 1057  NA 138  K 4.4  CL 105  CO2 20*  GLUCOSE 150*  BUN 12  CREATININE 2.73*  CALCIUM 9.3   PT/INR No results for input(s): LABPROT, INR in the last 72 hours. ABG No results for input(s): PHART, HCO3 in the last 72 hours.  Invalid input(s): PCO2,  PO2  Studies/Results: Ct Abdomen Pelvis Wo Contrast  09/08/2015  CLINICAL DATA:  Abdominal pain, onset yesterday. History of kidney stones. EXAM: CT ABDOMEN AND PELVIS WITHOUT CONTRAST TECHNIQUE: Multidetector CT imaging of the abdomen and pelvis was performed following the standard protocol without IV contrast. COMPARISON:  08/30/2008 FINDINGS: Lower chest: Stable  pleural-based 3 mm nodule in the left lower lobe on sequence 3, image 9. This is benign based on the stability. No pleural effusions. Hepatobiliary: No acute abnormality to the liver or gallbladder. Pancreas: Normal appearance of the pancreas without inflammation or duct dilatation. Spleen: Normal appearance of spleen without enlargement Adrenals/Urinary Tract: Normal adrenal glands. Mild to moderate left hydronephrosis due to a 7 mm stone in the proximal left ureter. There is left perinephric edema. There is a 8 mm stone in the left kidney lower pole and a 3 mm stone in the left interpolar region. Probable bilateral renal cysts which are poorly characterized on this non contrast examination. Fullness of the right renal pelvis appears unchanged and there is no evidence for right kidney or right ureter stones. There is a punctate calcification along the medial aspect of the right kidney but this could be associated with a cyst. Normal appearance of the urinary bladder. Stomach/Bowel: Evidence for an appendectomy. No evidence for bowel obstruction. Vascular/Lymphatic: Atherosclerotic calcifications in the abdominal aorta without aneurysm. No suspicious lymphadenopathy. Reproductive: Uterus has been removed. Normal appearance of the left ovary. The right ovary is likely surgically absent. Other: No free fluid.  No free air. Musculoskeletal: Degenerative facet disease in lower lumbar spine. IMPRESSION: Mild to moderate left hydronephrosis with left perinephric edema due to a 7 mm stone in the proximal left ureter. Left nephrolithiasis. Chronic fullness of  the right renal pelvis without right kidney stones. Electronically Signed   By: Richarda Overlie M.D.   On: 09/08/2015 16:42   Dg Chest 2 View  09/08/2015  CLINICAL DATA:  Chest pain intermittently since 09/06/2015. EXAM: CHEST  2 VIEW COMPARISON:  PA and lateral chest 09/05/2014 and 04/03/2014. FINDINGS: The lungs are clear. Heart size is normal. No pneumothorax or pleural effusion. No focal bony abnormality. IMPRESSION: Negative chest. Electronically Signed   By: Whitney Kanner M.D.   On: 09/08/2015 11:39   I have reviewed her labs and CT films and report and discussed her case with Whitney Rutherford PA in the ER.   Assessment: She has a 7mm obstructing left proximal stone with persistent pain and AKI.   She had a low grade fever and leukocytosis.   She has left renal stones with an 8mm LLP stone.   I am going to take her to the OR for cystoscopy with left stent insertion and reviewed the risks of bleeding, infection, ureteral injury, need for a perc, need for later definitive stone treatment, thrombotic events and anesthetic risks.         Whitney Parrish 09/08/2015 [phone]

## 2015-09-08 NOTE — Transfer of Care (Signed)
Immediate Anesthesia Transfer of Care Note  Patient: Whitney Parrish  Procedure(s) Performed: Procedure(s): CYSTOSCOPY WITH LEFT STENT PLACEMENT (Left)  Patient Location: PACU  Anesthesia Type:General  Level of Consciousness: awake  Airway & Oxygen Therapy: Patient Spontanous Breathing and Patient connected to face mask oxygen  Post-op Assessment: Report given to RN and Post -op Vital signs reviewed and stable  Post vital signs: Reviewed and stable  Last Vitals:  Filed Vitals:   09/08/15 2015 09/08/15 2055  BP: 151/101 160/89  Pulse: 93 81  Temp:  36.4 C  Resp:      Last Pain:  Filed Vitals:   09/08/15 2058  PainSc: 8          Complications: No apparent anesthesia complications

## 2015-09-08 NOTE — ED Provider Notes (Signed)
CSN: [id number]     Arrival date & time 09/08/15  1040 History   First MD Initiated Contact with Patient 09/08/15 1329     Chief Complaint  Patient presents with  . Chest Pain  . Abdominal Pain     (Consider location/radiation/quality/duration/timing/severity/associated sxs/prior Treatment) HPI   Whitney Parrish is a 60 y.o. female, Patient with no pertinent past medical history, presenting to the ED with Generalized abdominal pain, worse on the left that began suddenly yesterday at 10 AM. Patient states she has never felt this kind of pain before. Endorses 2 days of constipation. Patient rates her pain 10 out of 10, describes it as sharp alternating with squeezing, nonradiating. Patient states she has a history of IBS and kidney stones, feels different from either, but closer to kidney stones. Patient also told the triage nurse she was having intermittent chest pain, but patient states this is a chronic issue and she is not currently having chest pain. Patient denies N/V/D, urinary complaints, fever/chills, shortness of breath, abnormal vaginal bleeding or discharge, or any other complaints.     Past Medical History  Diagnosis Date  . Breast abscess 10/2011    left breast  . Chest pain     last chest pain 7-15 2013 admit to [facility]c for 3 days  . Depression   . Shortness of breath   . Neuromuscular disorder     bells palsy  . Anxiety   . Bell's palsy 1980's    left side  . Headache(784.0)     migraine  . Arthritis     shoulders and hands  . Vertigo   . Sleep apnea 2007    no cpap given to use per pt   Past Surgical History  Procedure Laterality Date  . Left breast abscess i&d  10/2011  . Uterine fibroid surgery  1986  . Abdominal surgery for scar tissue  2007 or 2008  . Breast cyst excision  12/15/2011    Procedure: CYST EXCISION BREAST;  Surgeon: Shelly Rubenstein, MD;  Location: [facility];  Service: General;  Laterality: Left;  Excision of a Chronic Left Breast Sebaceous Cyst    Family History  Problem Relation Age of Onset  . Cancer Mother     Stomach  . Cancer Father     Unknown   Social History  Substance Use Topics  . Smoking status: Current Every Day Smoker -- 0.25 packs/day for 30 years    Types: Cigarettes  . Smokeless tobacco: Never Used     Comment: quitting on her own "    QUITTING ON HER OWN "  . Alcohol Use: No   OB History    No data available     Review of Systems  Constitutional: Negative for fever, chills and diaphoresis.  Respiratory: Negative for shortness of breath.   Cardiovascular: Negative for chest pain.  Gastrointestinal: Positive for abdominal pain and constipation. Negative for nausea, vomiting, diarrhea and blood in stool.  Genitourinary: Negative for dysuria, hematuria, vaginal bleeding and vaginal discharge.  Musculoskeletal: Negative for back pain.  Skin: Negative for color change and pallor.  All other systems reviewed and are negative.     Allergies  Sulfa antibiotics  Home Medications   Prior to Admission medications   Medication Sig Start Date End Date Taking? Authorizing Provider  acitretin (SORIATANE) 25 MG capsule Take 25 mg by mouth daily before breakfast.   Yes Historical Provider, MD  adalimumab (HUMIRA) 40 MG/0.8ML injection Inject 40 mg  into the skin every 14 (fourteen) days. Every other Wednesday, last injection 12/20/12   Yes Historical Provider, MD  albuterol (PROVENTIL HFA;VENTOLIN HFA) 108 (90 BASE) MCG/ACT inhaler Inhale 1-2 puffs into the lungs every 6 (six) hours as needed for wheezing or shortness of breath.   Yes Historical Provider, MD  ALPRAZolam (XANAX) 0.25 MG tablet Take 0.25 mg by mouth every morning.   Yes Historical Provider, MD  amLODipine (NORVASC) 2.5 MG tablet Take 2.5 mg by mouth daily.   Yes Historical Provider, MD  aspirin 81 MG tablet Take 81 mg by mouth daily.   Yes Historical Provider, MD  [employer] Extract 2 % OINT Apply 1 application topically daily.   Yes Historical  Provider, MD  dicyclomine (BENTYL) 10 MG capsule Take 10 mg by mouth 2 (two) times daily.    Yes Historical Provider, MD  halobetasol (ULTRAVATE) 0.05 % cream Apply 1 application topically 2 (two) times daily as needed (for psoriasis).   Yes Historical Provider, MD  isosorbide mononitrate (IMDUR) 30 MG 24 hr tablet Take 15-30 mg by mouth daily as needed (only when needed). Takes 1/2 tab, next day 1/2 tab and on 3rd day takes 1 tablet and repeats as needed   Yes Historical Provider, MD  meclizine (ANTIVERT) 25 MG tablet Take 25 mg by mouth 3 (three) times daily.   Yes Historical Provider, MD  nitroGLYCERIN (NITROSTAT) 0.4 MG SL tablet Place 1 tablet (0.4 mg total) under the tongue every 5 (five) minutes x 3 doses as needed for chest pain. 12/29/12  Yes [name], MD  Olopatadine HCl 0.2 % SOLN Apply 1 drop to eye every morning.   Yes Historical Provider, MD  oxyCODONE (ROXICODONE) 15 MG immediate release tablet Take 15 mg by mouth every 4 (four) hours as needed for pain.   Yes Historical Provider, MD  venlafaxine (EFFEXOR) 75 MG tablet Take 75 mg by mouth every evening.   Yes Historical Provider, MD  cycloSPORINE (RESTASIS) 0.05 % ophthalmic emulsion Place 1 drop into both eyes 2 (two) times daily.    Historical Provider, MD  metoprolol tartrate (LOPRESSOR) 25 MG tablet Take 25 mg by mouth 2 (two) times daily.    Historical Provider, MD   BP 174/96 mmHg  Pulse 87  Temp(Src) 99.4 F (37.4 C) (Rectal)  Resp 20  Ht 5' (1.524 m)  Wt 66.225 kg  BMI 28.51 kg/m2  SpO2 98% Physical Exam  Constitutional: She appears well-developed and well-nourished. No distress.  HENT:  Head: Normocephalic and atraumatic.  Eyes: Conjunctivae are normal. Pupils are equal, round, and reactive to light.  Neck: Neck supple.  Cardiovascular: Normal rate, regular rhythm, normal heart sounds and intact distal pulses.   Pulmonary/Chest: Effort normal and breath sounds normal. No respiratory distress.  Abdominal: Soft.  There is tenderness in the left upper quadrant and left lower quadrant. There is no guarding.  Generalized tenderness, however, the left lower and left upper quadrants are exquisitely tender.  Musculoskeletal: She exhibits no edema or tenderness.  Lymphadenopathy:    She has no cervical adenopathy.  Neurological: She is alert.  Skin: Skin is warm and dry. She is not diaphoretic.  Psychiatric: She has a normal mood and affect. Her behavior is normal.  Nursing note and vitals reviewed.   ED Course  Procedures (including critical care time) Labs Review Labs Reviewed  COMPREHENSIVE METABOLIC PANEL - Abnormal; Notable for the following:    CO2 20 (*)    Glucose, Bld 150 (*)  Creatinine, Ser 2.73 (*)    GFR calc non Af Amer 18 (*)    GFR calc Af Amer 21 (*)    All other components within normal limits  CBC - Abnormal; Notable for the following:    WBC 14.8 (*)    All other components within normal limits  URINALYSIS, ROUTINE W REFLEX MICROSCOPIC (NOT AT [facility]) - Abnormal; Notable for the following:    APPearance CLOUDY (*)    Hgb urine dipstick TRACE (*)    Protein, ur 30 (*)    All other components within normal limits  URINE MICROSCOPIC-ADD ON - Abnormal; Notable for the following:    Squamous Epithelial / LPF 0-5 (*)    Bacteria, UA RARE (*)    All other components within normal limits  CULTURE, BLOOD (ROUTINE X 2)  CULTURE, BLOOD (ROUTINE X 2)  LIPASE, BLOOD  I-STAT TROPOININ, ED  I-STAT CG4 LACTIC ACID, ED   BUN  Date Value Ref Range Status  09/08/2015 12 6 - 20 mg/dL Final  81/19/147805/09/2014 14 6 - 20 mg/dL Final  29/56/213008/28/2014 14 6 - 23 mg/dL Final  86/57/846908/27/2014 12 6 - 23 mg/dL Final   CREATININE, SER  Date Value Ref Range Status  09/08/2015 2.73* 0.44 - 1.00 mg/dL Final  62/95/284105/09/2014 3.241.84* 0.44 - 1.00 mg/dL Final  40/10/272508/28/2014 3.661.74* 0.50 - 1.10 mg/dL Final  44/03/474208/27/2014 5.951.74* 0.50 - 1.10 mg/dL Final     Imaging Review Ct Abdomen Pelvis Wo Contrast  09/08/2015  CLINICAL DATA:   Abdominal pain, onset yesterday. History of kidney stones. EXAM: CT ABDOMEN AND PELVIS WITHOUT CONTRAST TECHNIQUE: Multidetector CT imaging of the abdomen and pelvis was performed following the standard protocol without IV contrast. COMPARISON:  08/30/2008 FINDINGS: Lower chest: Stable pleural-based 3 mm nodule in the left lower lobe on sequence 3, image 9. This is benign based on the stability. No pleural effusions. Hepatobiliary: No acute abnormality to the liver or gallbladder. Pancreas: Normal appearance of the pancreas without inflammation or duct dilatation. Spleen: Normal appearance of spleen without enlargement Adrenals/Urinary Tract: Normal adrenal glands. Mild to moderate left hydronephrosis due to a 7 mm stone in the proximal left ureter. There is left perinephric edema. There is a 8 mm stone in the left kidney lower pole and a 3 mm stone in the left interpolar region. Probable bilateral renal cysts which are poorly characterized on this non contrast examination. Fullness of the right renal pelvis appears unchanged and there is no evidence for right kidney or right ureter stones. There is a punctate calcification along the medial aspect of the right kidney but this could be associated with a cyst. Normal appearance of the urinary bladder. Stomach/Bowel: Evidence for an appendectomy. No evidence for bowel obstruction. Vascular/Lymphatic: Atherosclerotic calcifications in the abdominal aorta without aneurysm. No suspicious lymphadenopathy. Reproductive: Uterus has been removed. Normal appearance of the left ovary. The right ovary is likely surgically absent. Other: No free fluid.  No free air. Musculoskeletal: Degenerative facet disease in lower lumbar spine. IMPRESSION: Mild to moderate left hydronephrosis with left perinephric edema due to a 7 mm stone in the proximal left ureter. Left nephrolithiasis. Chronic fullness of the right renal pelvis without right kidney stones. Electronically Signed   By:  Richarda OverlieAdam  Henn M.D.   On: 09/08/2015 16:42   Dg Chest 2 View  09/08/2015  CLINICAL DATA:  Chest pain intermittently since 09/06/2015. EXAM: CHEST  2 VIEW COMPARISON:  PA and lateral chest 09/05/2014 and 04/03/2014. FINDINGS: The lungs are  clear. Heart size is normal. No pneumothorax or pleural effusion. No focal bony abnormality. IMPRESSION: Negative chest. Electronically Signed   By: Drusilla Kanner M.D.   On: 09/08/2015 11:39   I have personally reviewed and evaluated these images and lab results as part of my medical decision-making.   EKG Interpretation None      Medications  Barium Sulfate 2.1 % SUSP 450 mL (not administered)  Barium Sulfate 2.1 % SUSP (not administered)  sodium chloride 0.9 % bolus 1,000 mL (not administered)  sodium chloride 0.9 % bolus 1,000 mL (1,000 mLs Intravenous New Bag/Given 09/08/15 1433)  morphine 4 MG/ML injection 4 mg (4 mg Intravenous Given 09/08/15 1443)  ondansetron (ZOFRAN) injection 4 mg (4 mg Intravenous Given 09/08/15 1434)  aspirin chewable tablet 243 mg (243 mg Oral Given 09/08/15 1434)  HYDROmorphone (DILAUDID) injection 1 mg (1 mg Intravenous Given 09/08/15 1706)   Orders Placed This Encounter  Procedures  . Culture, blood (routine x 2)  . DG Chest 2 View  . CT Abdomen Pelvis Wo Contrast  . Lipase, blood  . Comprehensive metabolic panel  . CBC  . Urinalysis, Routine w reflex microscopic  . Urine microscopic-add on  . Diet NPO time specified  . Saline Lock IV, Maintain IV access  . Check Rectal Temperature  . Consult to hospitalist  . Consult for [facility] Admission  . Consult to urology  . I-Stat Troponin, ED (not at [name])  . I-Stat CG4 Lactic Acid, ED  . ED EKG  . EKG 12-Lead    MDM   Final diagnoses:  Generalized abdominal pain  Acute kidney injury (HCC)    Whitney Parrish presents with generalized abdominal pain that began suddenly yesterday.  Findings and plan of care discussed with Alvira Monday, MD.   Patient's  abdominal exam is tender enough to give suspicion for an intra-abdominal disease process. Patient's creatinine found to be increased by at least 50% over previous values. CT scan ordered, however, patient could not get IV contrast due to her increase in creatinine. The patient's tachycardia resolved with fluid administration. Patient's lab abnormalities are mildly suggestive of a possible pre-sepsis. Lactate is just under 2, her temperature is elevated, and patient has increased WBC count. 3:46 PM Spoke with Durenda Age from [facility], who stated she would come see the patient to evaluate her. 3:53 PM Spoke with Dr. Konrad Dolores, who requested that we wait for the abdominal CT so they have some direction for treatment. 7 mm obstructing stone in the left ureter on CT. Upon reevaluation, patient states her pain is not well controlled. CT results communicated with the patient. Urology consult placed. 5:10 PM End of shift patient care report given to Arthor Captain, PA-C. Plan: speak with urology consult. Expect patient to be transferred to [facility] for stent.  Filed Vitals:   09/08/15 1045 09/08/15 1237  BP: 152/120 141/114  Pulse: 99 111  Temp: 98.1 F (36.7 C) 98.7 F (37.1 C)  TempSrc: Oral Oral  Resp: 16 18  Height: 5' (1.524 m)   Weight: 66.225 kg   SpO2: 99% 100%   Filed Vitals:   09/08/15 1415 09/08/15 1417 09/08/15 1430 09/08/15 1445  BP: 150/108  145/83 160/95  Pulse: 98  83 90  Temp:  99.4 F (37.4 C)    TempSrc:  Rectal    Resp: 17  14 20   Height:      Weight:      SpO2: 100%  100% 99%  Filed Vitals:   09/08/15 1417 09/08/15 1430 09/08/15 1445 09/08/15 1600  BP:  145/83 160/95 174/96  Pulse:  83 90 87  Temp: 99.4 F (37.4 C)     TempSrc: Rectal     Resp:  14 20 20   Height:      Weight:      SpO2:  100% 99% 98%     Anselm Pancoast, PA-C 09/08/15 1711  Alvira Monday, MD 09/09/15 1307

## 2015-09-08 NOTE — ED Notes (Signed)
Pt reports chest pains since Saturday. Having several abd cramping since yesterday. Denies vomiting or diarrhea. reports sob and non productive cough. Airway intact.

## 2015-09-08 NOTE — Anesthesia Postprocedure Evaluation (Signed)
Anesthesia Post Note  Patient: Whitney Parrish  Procedure(s) Performed: Procedure(s) (LRB): CYSTOSCOPY WITH LEFT STENT PLACEMENT (Left)  Patient location during evaluation: PACU Anesthesia Type: General Level of consciousness: awake and alert Pain management: pain level controlled Vital Signs Assessment: post-procedure vital signs reviewed and stable Respiratory status: spontaneous breathing, nonlabored ventilation, respiratory function stable and patient connected to nasal cannula oxygen Cardiovascular status: blood pressure returned to baseline and stable Postop Assessment: no signs of nausea or vomiting Anesthetic complications: no    Last Vitals:  Filed Vitals:   09/08/15 2300 09/08/15 2315  BP: 122/73 126/82  Pulse:    Temp: 36.4 C   Resp:      Last Pain:  Filed Vitals:   09/08/15 2318  PainSc: Asleep                 Wasil Wolke S

## 2015-09-09 ENCOUNTER — Encounter (HOSPITAL_COMMUNITY): Payer: Self-pay | Admitting: Urology

## 2015-09-09 DIAGNOSIS — N189 Chronic kidney disease, unspecified: Secondary | ICD-10-CM | POA: Diagnosis present

## 2015-09-09 DIAGNOSIS — N136 Pyonephrosis: Secondary | ICD-10-CM | POA: Diagnosis present

## 2015-09-09 DIAGNOSIS — N289 Disorder of kidney and ureter, unspecified: Secondary | ICD-10-CM

## 2015-09-09 LAB — CREATININE, SERUM
Creatinine, Ser: 2.49 mg/dL — ABNORMAL HIGH (ref 0.44–1.00)
GFR calc Af Amer: 23 mL/min — ABNORMAL LOW (ref >60)
GFR calc non Af Amer: 20 mL/min — ABNORMAL LOW (ref >60)

## 2015-09-09 LAB — CBC
HCT: 40.6 % (ref 36.0–46.0)
Hemoglobin: 13.5 g/dL (ref 12.0–15.0)
MCH: 28.8 pg (ref 26.0–34.0)
MCHC: 33.3 g/dL (ref 30.0–36.0)
MCV: 86.6 fL (ref 78.0–100.0)
Platelets: 245 10*3/uL (ref 150–400)
RBC: 4.69 MIL/uL (ref 3.87–5.11)
RDW: 12.9 % (ref 11.5–15.5)
WBC: 12.2 10*3/uL — ABNORMAL HIGH (ref 4.0–10.5)

## 2015-09-09 MED ORDER — CEPHALEXIN 500 MG PO CAPS: 500.0000 mg | ORAL_CAPSULE | Freq: Three times a day (TID) | ORAL | Status: DC

## 2015-09-09 NOTE — Care Management Obs Status (Signed)
MEDICARE OBSERVATION STATUS NOTIFICATION   Patient Details  Name: Whitney Parrish MRN: [id number] Date of Birth: 1955/05/27   Medicare Observation Status Notification Given:  Yes    MahabirOlegario Messier, Rio Taber, RN 09/09/2015, 9:49 AM

## 2015-09-09 NOTE — Discharge Summary (Signed)
Physician Discharge Summary  Patient ID: Whitney Parrish MRN: [id number] DOB/AGE: 06/18/1955 60 y.o.  Admit date: 09/08/2015 Discharge date: 09/09/2015  Admission Diagnoses:  Left nephrolithiasis  Discharge Diagnoses:  Principal Problem:   Left nephrolithiasis Active Problems:   Obstructive uropathy   Acute pyonephrosis   Chronic renal insufficiency   Acute renal insufficiency   Past Medical History  Diagnosis Date  . Breast abscess 10/2011    left breast  . Chest pain     last chest pain 7-15 2013 admit to mc for 3 days  . Depression   . Shortness of breath   . Neuromuscular disorder (HCC)     bells palsy  . Anxiety   . Bell's palsy 1980's    left side  . Headache(784.0)     migraine  . Arthritis     shoulders and hands  . Vertigo   . Sleep apnea 2007    no cpap given to use per pt    Surgeries: Procedure(s): CYSTOSCOPY WITH LEFT STENT PLACEMENT on 09/08/2015   Consultants (if any):    Discharged Condition: Improved  Hospital Course: Whitney Parrish is an 60 y.o. female who was admitted 09/08/2015 with a diagnosis of Left nephrolithiasis and went to the operating room on 09/08/2015 and underwent the above named procedures. She was covered with rocephin.   Her pain was relieved and she remained afebrile with a declining Cr.   She has mild irritative symptoms from the stent.     She was given perioperative antibiotics:      Anti-infectives    Start     Dose/Rate Route Frequency Ordered Stop   09/09/15 2200  cefTRIAXone (ROCEPHIN) 1 g in dextrose 5 % 50 mL IVPB     1 g 100 mL/hr over 30 Minutes Intravenous Every 24 hours 09/08/15 2321     09/09/15 0000  cephALEXin (KEFLEX) 500 MG capsule     500 mg Oral 3 times daily 09/09/15 0910     09/08/15 2026  cefTRIAXone (ROCEPHIN) 1 g in dextrose 5 % 50 mL IVPB     1 g 100 mL/hr over 30 Minutes Intravenous 30 min pre-op 09/08/15 2026 09/08/15 2217    .  She was given sequential compression devices, early ambulation, and  heparin for DVT prophylaxis.  She benefited maximally from the hospital stay and there were no complications.    Recent vital signs:  Filed Vitals:   09/08/15 2343 09/09/15 0641  BP: 126/79 123/75  Pulse: 76 77  Temp: 97.6 F (36.4 C) 98.4 F (36.9 C)  Resp: 16 16    Recent laboratory studies:  Lab Results  Component Value Date   HGB 13.5 09/09/2015   HGB 14.1 09/08/2015   HGB 13.5 09/05/2014   Lab Results  Component Value Date   WBC 12.2* 09/09/2015   PLT 245 09/09/2015   Lab Results  Component Value Date   INR 0.99 12/27/2012   Lab Results  Component Value Date   NA 138 09/08/2015   K 4.4 09/08/2015   CL 105 09/08/2015   CO2 20* 09/08/2015   BUN 12 09/08/2015   CREATININE 2.49* 09/09/2015   GLUCOSE 150* 09/08/2015    Discharge Medications:     Medication List    TAKE these medications        acitretin 25 MG capsule  Commonly known as:  SORIATANE  Take 25 mg by mouth daily before breakfast.     albuterol 108 (90 Base) MCG/ACT inhaler  Commonly known as:  PROVENTIL HFA;VENTOLIN HFA  Inhale 1-2 puffs into the lungs every 6 (six) hours as needed for wheezing or shortness of breath.     ALPRAZolam 0.25 MG tablet  Commonly known as:  XANAX  Take 0.25 mg by mouth every morning.     amLODipine 2.5 MG tablet  Commonly known as:  NORVASC  Take 2.5 mg by mouth daily.     aspirin 81 MG tablet  Take 81 mg by mouth daily.     cephALEXin 500 MG capsule  Commonly known as:  KEFLEX  Take 1 capsule (500 mg total) by mouth 3 (three) times daily.     [employer] Extract 2 % Oint  Apply 1 application topically daily.     cycloSPORINE 0.05 % ophthalmic emulsion  Commonly known as:  RESTASIS  Place 1 drop into both eyes 2 (two) times daily.     dicyclomine 10 MG capsule  Commonly known as:  BENTYL  Take 10 mg by mouth 2 (two) times daily.     halobetasol 0.05 % cream  Commonly known as:  ULTRAVATE  Apply 1 application topically 2 (two) times daily as needed  (for psoriasis).     HUMIRA 40 MG/0.8ML injection  Generic drug:  adalimumab  Inject 40 mg into the skin every 14 (fourteen) days. Every other Wednesday, last injection 12/20/12     isosorbide mononitrate 30 MG 24 hr tablet  Commonly known as:  IMDUR  Take 15-30 mg by mouth daily as needed (only when needed). Takes 1/2 tab, next day 1/2 tab and on 3rd day takes 1 tablet and repeats as needed     meclizine 25 MG tablet  Commonly known as:  ANTIVERT  Take 25 mg by mouth 3 (three) times daily.     metoprolol tartrate 25 MG tablet  Commonly known as:  LOPRESSOR  Take 25 mg by mouth 2 (two) times daily.     nitroGLYCERIN 0.4 MG SL tablet  Commonly known as:  NITROSTAT  Place 1 tablet (0.4 mg total) under the tongue every 5 (five) minutes x 3 doses as needed for chest pain.     Olopatadine HCl 0.2 % Soln  Apply 1 drop to eye every morning.     oxyCODONE 15 MG immediate release tablet  Commonly known as:  ROXICODONE  Take 15 mg by mouth every 4 (four) hours as needed for pain.     venlafaxine 75 MG tablet  Commonly known as:  EFFEXOR  Take 75 mg by mouth every evening.        Diagnostic Studies: Ct Abdomen Pelvis Wo Contrast  09/08/2015  CLINICAL DATA:  Abdominal pain, onset yesterday. History of kidney stones. EXAM: CT ABDOMEN AND PELVIS WITHOUT CONTRAST TECHNIQUE: Multidetector CT imaging of the abdomen and pelvis was performed following the standard protocol without IV contrast. COMPARISON:  08/30/2008 FINDINGS: Lower chest: Stable pleural-based 3 mm nodule in the left lower lobe on sequence 3, image 9. This is benign based on the stability. No pleural effusions. Hepatobiliary: No acute abnormality to the liver or gallbladder. Pancreas: Normal appearance of the pancreas without inflammation or duct dilatation. Spleen: Normal appearance of spleen without enlargement Adrenals/Urinary Tract: Normal adrenal glands. Mild to moderate left hydronephrosis due to a 7 mm stone in the proximal  left ureter. There is left perinephric edema. There is a 8 mm stone in the left kidney lower pole and a 3 mm stone in the left interpolar region. Probable bilateral renal  cysts which are poorly characterized on this non contrast examination. Fullness of the right renal pelvis appears unchanged and there is no evidence for right kidney or right ureter stones. There is a punctate calcification along the medial aspect of the right kidney but this could be associated with a cyst. Normal appearance of the urinary bladder. Stomach/Bowel: Evidence for an appendectomy. No evidence for bowel obstruction. Vascular/Lymphatic: Atherosclerotic calcifications in the abdominal aorta without aneurysm. No suspicious lymphadenopathy. Reproductive: Uterus has been removed. Normal appearance of the left ovary. The right ovary is likely surgically absent. Other: No free fluid.  No free air. Musculoskeletal: Degenerative facet disease in lower lumbar spine. IMPRESSION: Mild to moderate left hydronephrosis with left perinephric edema due to a 7 mm stone in the proximal left ureter. Left nephrolithiasis. Chronic fullness of the right renal pelvis without right kidney stones. Electronically Signed   By: Richarda Overlie M.D.   On: 09/08/2015 16:42   Dg Chest 2 View  09/08/2015  CLINICAL DATA:  Chest pain intermittently since 09/06/2015. EXAM: CHEST  2 VIEW COMPARISON:  PA and lateral chest 09/05/2014 and 04/03/2014. FINDINGS: The lungs are clear. Heart size is normal. No pneumothorax or pleural effusion. No focal bony abnormality. IMPRESSION: Negative chest. Electronically Signed   By: Drusilla Kanner M.D.   On: 09/08/2015 11:39    Disposition: 01-Home or Self Care    Follow-up Information    Follow up with Anner Crete, MD.   Specialty:  Urology   Why:  The office will call to arrange a f/u appointment and your next procedure.    Contact information:   [address] [place] [address] [phone]      She will be  scheduled for ureteroscopy stone extraction for her left ureteral and renal stones in 2-3 weeks.   SignedAnner Crete 09/09/2015, 9:13 AM

## 2015-09-09 NOTE — Discharge Instructions (Signed)
CYSTOSCOPY HOME CARE INSTRUCTIONS  Activity: Rest for the remainder of the day.  Do not drive or operate equipment today.  You may resume normal activities in one to two days as instructed by your physician.   Meals: Drink plenty of liquids and eat light foods such as gelatin or soup this evening.  You may return to a normal meal plan tomorrow.  Return to Work: You may return to work in one to two days or as instructed by your physician.  Special Instructions / Symptoms: Call your physician if any of these symptoms occur:   -persistent or heavy bleeding  -bleeding which continues after first few urination  -large blood clots that are difficult to pass  -urine stream diminishes or stops completely  -fever equal to or higher than 101 degrees Farenheit.  -cloudy urine with a strong, foul odor  -severe pain  Females should always wipe from front to back after elimination.  You may feel some burning pain when you urinate.  This should disappear with time.  Applying moist heat to the lower abdomen or a hot tub bath may help relieve the pain. \  The office will call to set up a follow up appointment in 1-2 weeks and the next procedure in 2-3 weeks.   Patient Signature:  ________________________________________________________  Nurse's Signature:  ________________________________________________________

## 2015-09-09 NOTE — Op Note (Signed)
[name]:  Costlow, Sondi                ACCOUNT NO.:  [id number]  MEDICAL RECORD NO.:  [id number]  LOCATION:  1417                         [facility]  PHYSICIAN:  Excell SeltzerJohn J. Annabell HowellsWrenn, M.D.    DATE OF BIRTH:  05-26-1955  DATE OF PROCEDURE:  09/08/2015 DATE OF DISCHARGE:  09/09/2015                              OPERATIVE REPORT   PROCEDURE:  Cystoscopy with left retrograde pyelogram, and interpretation and insertion of left double-J stent.  PREOPERATIVE DIAGNOSIS:  Left proximal ureteral stone with possible infection.  POSTOPERATIVE DIAGNOSIS:  Left proximal ureteral stone with pyonephrosis.  SURGEON:  Excell SeltzerJohn J. Annabell HowellsWrenn, MD  ANESTHESIA:  General.  SPECIMEN:  Urine culture.  DRAINS:  A 6-French 24 cm left double-J stent.  BLOOD LOSS:  None.  COMPLICATIONS:  None.  INDICATIONS:  Ms. [name] is a 60 year old African American female who presented to the emergency room with severe left flank pain and low- grade fever, leukocytosis.  She was found on CT scan to have a 7 mm left proximal ureteral stone with obstruction.  She was also found to have acute kidney injury.  FINDINGS AND PROCEDURE:  She was taken to the operating room where she was given 2 g of Rocephin.  She was placed in lithotomy position.  Her perineum and genitalia were prepped with Betadine solution.  She was draped in usual sterile fashion.  Cystoscopy was performed using a 23-French scope and 30-degree lens. Examination revealed a normal urethra.  The bladder wall was smooth and pale without tumor, stones, or inflammation.  Ureteral orifices were unremarkable.  The left ureteral orifice was cannulated with 5-French open-end catheter and contrast was instilled.  This revealed a delicate ureter up to approximately 4 cm below the UPJ where there was a filling defect in proximal dilation.  A guidewire was then passed to the kidney alongside the stone without difficulty and a 6-French 24 cm double-J stent without  string was inserted to the kidney over the wire under fluoroscopic guidance.  The wire was removed leaving good coil in the kidney and good coil in the bladder.  Of note, after placement of the wire and particularly after placing the stent, there was efflux of purulent urine from the left ureteral orifice and through the stent ports.  A specimen was obtained from this urine and will be sent for urine culture.  The bladder was drained.  The patient was taken down from lithotomy position.  Her anesthetic was reversed.  She was moved to recovery room in stable condition.  There were no complications.     Excell SeltzerJohn J. Annabell HowellsWrenn, M.D.     JJW/MEDQ  D:  09/08/2015  T:  09/09/2015  Job:  [id number]

## 2015-09-10 LAB — URINE CULTURE: Culture: NO GROWTH

## 2015-09-13 LAB — CULTURE, BLOOD (ROUTINE X 2)
Culture: NO GROWTH
Culture: NO GROWTH

## 2015-09-22 ENCOUNTER — Other Ambulatory Visit: Payer: Self-pay | Admitting: Urology

## 2015-09-23 ENCOUNTER — Encounter (HOSPITAL_BASED_OUTPATIENT_CLINIC_OR_DEPARTMENT_OTHER): Payer: Self-pay | Admitting: *Deleted

## 2015-09-23 NOTE — Progress Notes (Signed)
NPO AFTER MN.  ARRIVE AT 0600.  CURRENT LAB RESULTS AND EKG IN CHART AND EPIC.  WILL TAKE AM MEDS DOS W/ SIPS OF WATER AND IF NEEDED OXYCODONE.

## 2015-09-25 ENCOUNTER — Ambulatory Visit (HOSPITAL_BASED_OUTPATIENT_CLINIC_OR_DEPARTMENT_OTHER): Payer: Medicare Other | Admitting: Anesthesiology

## 2015-09-25 ENCOUNTER — Encounter (HOSPITAL_BASED_OUTPATIENT_CLINIC_OR_DEPARTMENT_OTHER): Payer: Self-pay | Admitting: *Deleted

## 2015-09-25 ENCOUNTER — Encounter (HOSPITAL_BASED_OUTPATIENT_CLINIC_OR_DEPARTMENT_OTHER)
Admission: RE | Disposition: A | Discharge status: Home / Self Care | Payer: Self-pay | Source: Ambulatory Visit | Attending: Urology

## 2015-09-25 ENCOUNTER — Ambulatory Visit (HOSPITAL_BASED_OUTPATIENT_CLINIC_OR_DEPARTMENT_OTHER)
Admission: RE | Admit: 2015-09-25 | Discharge: 2015-09-25 | Disposition: A | Discharge status: Home / Self Care | Payer: Medicare Other | Source: Ambulatory Visit | Attending: Urology | Admitting: Urology

## 2015-09-25 DIAGNOSIS — G4730 Sleep apnea, unspecified: Secondary | ICD-10-CM | POA: Insufficient documentation

## 2015-09-25 DIAGNOSIS — F17210 Nicotine dependence, cigarettes, uncomplicated: Secondary | ICD-10-CM | POA: Insufficient documentation

## 2015-09-25 DIAGNOSIS — R109 Unspecified abdominal pain: Secondary | ICD-10-CM | POA: Diagnosis present

## 2015-09-25 DIAGNOSIS — N200 Calculus of kidney: Secondary | ICD-10-CM | POA: Insufficient documentation

## 2015-09-25 DIAGNOSIS — M19011 Primary osteoarthritis, right shoulder: Secondary | ICD-10-CM | POA: Insufficient documentation

## 2015-09-25 DIAGNOSIS — L409 Psoriasis, unspecified: Secondary | ICD-10-CM | POA: Insufficient documentation

## 2015-09-25 DIAGNOSIS — M19012 Primary osteoarthritis, left shoulder: Secondary | ICD-10-CM | POA: Diagnosis not present

## 2015-09-25 DIAGNOSIS — M19041 Primary osteoarthritis, right hand: Secondary | ICD-10-CM | POA: Diagnosis not present

## 2015-09-25 DIAGNOSIS — Z87442 Personal history of urinary calculi: Secondary | ICD-10-CM | POA: Diagnosis not present

## 2015-09-25 DIAGNOSIS — M19042 Primary osteoarthritis, left hand: Secondary | ICD-10-CM | POA: Insufficient documentation

## 2015-09-25 HISTORY — DX: Personal history of other specified conditions: Z87.898

## 2015-09-25 HISTORY — DX: Other forms of dyspnea: R06.09

## 2015-09-25 HISTORY — DX: Dyspnea, unspecified: R06.00

## 2015-09-25 HISTORY — DX: Other fecal abnormalities: R19.5

## 2015-09-25 HISTORY — DX: Personal history of other diseases of the nervous system and sense organs: Z86.69

## 2015-09-25 HISTORY — DX: Dry eye syndrome of unspecified lacrimal gland: H04.129

## 2015-09-25 HISTORY — DX: Urgency of urination: R39.15

## 2015-09-25 HISTORY — DX: Personal history of urinary (tract) infections: Z87.440

## 2015-09-25 HISTORY — DX: Calculus of kidney: N20.0

## 2015-09-25 HISTORY — DX: Chronic kidney disease, stage 3 unspecified: N18.30

## 2015-09-25 HISTORY — DX: Migraine, unspecified, not intractable, without status migrainosus: G43.909

## 2015-09-25 HISTORY — DX: Personal history of other diseases of urinary system: Z87.448

## 2015-09-25 HISTORY — DX: Calculus of ureter: N20.1

## 2015-09-25 HISTORY — PX: HOLMIUM LASER APPLICATION: SHX5852

## 2015-09-25 HISTORY — DX: Personal history of other mental and behavioral disorders: Z86.59

## 2015-09-25 HISTORY — DX: Chronic kidney disease, stage 3 (moderate): N18.3

## 2015-09-25 HISTORY — PX: CYSTOSCOPY WITH URETEROSCOPY AND STENT PLACEMENT: SHX6377

## 2015-09-25 HISTORY — DX: Presence of dental prosthetic device (complete) (partial): Z97.2

## 2015-09-25 HISTORY — DX: Psoriasis, unspecified: L40.9

## 2015-09-25 HISTORY — DX: Benign paroxysmal vertigo, unspecified ear: H81.10

## 2015-09-25 HISTORY — DX: Hematuria, unspecified: R31.9

## 2015-09-25 LAB — BASIC METABOLIC PANEL
Anion gap: 7 (ref 5–15)
BUN: 20 mg/dL (ref 6–20)
CO2: 18 mmol/L — ABNORMAL LOW (ref 22–32)
Calcium: 8.3 mg/dL — ABNORMAL LOW (ref 8.9–10.3)
Chloride: 116 mmol/L — ABNORMAL HIGH (ref 101–111)
Creatinine, Ser: 1.84 mg/dL — ABNORMAL HIGH (ref 0.44–1.00)
GFR calc Af Amer: 33 mL/min — ABNORMAL LOW (ref >60)
GFR calc non Af Amer: 29 mL/min — ABNORMAL LOW (ref >60)
Glucose, Bld: 120 mg/dL — ABNORMAL HIGH (ref 65–99)
Potassium: 3.8 mmol/L (ref 3.5–5.1)
Sodium: 141 mmol/L (ref 135–145)

## 2015-09-25 SURGERY — CYSTOURETEROSCOPY, WITH STENT INSERTION
Anesthesia: General | Laterality: Left

## 2015-09-25 MED ORDER — METOPROLOL TARTRATE 5 MG/5ML IV SOLN
INTRAVENOUS | Status: DC | PRN
  Administered 2015-09-25 (×5): 1 mg via INTRAVENOUS

## 2015-09-25 MED ORDER — SODIUM CHLORIDE 0.9 % IV SOLN
250.0000 mL | INTRAVENOUS | Status: DC | PRN
  Filled 2015-09-25: qty 250

## 2015-09-25 MED ORDER — ACETAMINOPHEN 10 MG/ML IV SOLN
INTRAVENOUS | Status: AC
  Filled 2015-09-25: qty 100

## 2015-09-25 MED ORDER — SODIUM CHLORIDE 0.9 % IR SOLN
Status: DC | PRN
  Administered 2015-09-25: 4000 mL via INTRAVESICAL

## 2015-09-25 MED ORDER — PROPOFOL 10 MG/ML IV BOLUS
INTRAVENOUS | Status: DC | PRN
  Administered 2015-09-25: 150 mg via INTRAVENOUS
  Administered 2015-09-25: 20 mg via INTRAVENOUS

## 2015-09-25 MED ORDER — METOPROLOL TARTRATE 5 MG/5ML IV SOLN
INTRAVENOUS | Status: AC
  Filled 2015-09-25: qty 5

## 2015-09-25 MED ORDER — SODIUM CHLORIDE 0.9% FLUSH
3.0000 mL | INTRAVENOUS | Status: DC | PRN
  Filled 2015-09-25: qty 3

## 2015-09-25 MED ORDER — DEXAMETHASONE SODIUM PHOSPHATE 10 MG/ML IJ SOLN
INTRAMUSCULAR | Status: AC
  Filled 2015-09-25: qty 1

## 2015-09-25 MED ORDER — ONDANSETRON HCL 4 MG/2ML IJ SOLN
INTRAMUSCULAR | Status: DC | PRN
  Administered 2015-09-25: 4 mg via INTRAVENOUS

## 2015-09-25 MED ORDER — ACETAMINOPHEN 650 MG RE SUPP
650.0000 mg | RECTAL | Status: DC | PRN
  Filled 2015-09-25: qty 1

## 2015-09-25 MED ORDER — FENTANYL CITRATE (PF) 100 MCG/2ML IJ SOLN
25.0000 ug | INTRAMUSCULAR | Status: DC | PRN
  Filled 2015-09-25: qty 1

## 2015-09-25 MED ORDER — OXYCODONE HCL 5 MG PO TABS
5.0000 mg | ORAL_TABLET | ORAL | Status: DC | PRN
  Administered 2015-09-25: 5 mg via ORAL
  Filled 2015-09-25: qty 2

## 2015-09-25 MED ORDER — PROPOFOL 10 MG/ML IV BOLUS
INTRAVENOUS | Status: AC
  Filled 2015-09-25: qty 40

## 2015-09-25 MED ORDER — FENTANYL CITRATE (PF) 100 MCG/2ML IJ SOLN
INTRAMUSCULAR | Status: DC | PRN
  Administered 2015-09-25: 50 ug via INTRAVENOUS

## 2015-09-25 MED ORDER — LIDOCAINE HCL (CARDIAC) 20 MG/ML IV SOLN
INTRAVENOUS | Status: DC | PRN
  Administered 2015-09-25: 50 mg via INTRAVENOUS

## 2015-09-25 MED ORDER — ACETAMINOPHEN 10 MG/ML IV SOLN
INTRAVENOUS | Status: DC | PRN
  Administered 2015-09-25: 1000 mg via INTRAVENOUS

## 2015-09-25 MED ORDER — DEXTROSE 5 % IV SOLN
INTRAVENOUS | Status: AC
Start: 2015-09-25 — End: 2015-09-25
  Filled 2015-09-25: qty 50

## 2015-09-25 MED ORDER — DEXAMETHASONE SODIUM PHOSPHATE 4 MG/ML IJ SOLN
INTRAMUSCULAR | Status: DC | PRN
  Administered 2015-09-25: 10 mg via INTRAVENOUS

## 2015-09-25 MED ORDER — CEFTRIAXONE SODIUM 2 G IJ SOLR
INTRAMUSCULAR | Status: AC
  Filled 2015-09-25: qty 2

## 2015-09-25 MED ORDER — FENTANYL CITRATE (PF) 100 MCG/2ML IJ SOLN
INTRAMUSCULAR | Status: AC
  Filled 2015-09-25: qty 2

## 2015-09-25 MED ORDER — ACETAMINOPHEN 325 MG PO TABS
650.0000 mg | ORAL_TABLET | ORAL | Status: DC | PRN
  Filled 2015-09-25: qty 2

## 2015-09-25 MED ORDER — LIDOCAINE HCL (CARDIAC) 20 MG/ML IV SOLN
INTRAVENOUS | Status: AC
  Filled 2015-09-25: qty 5

## 2015-09-25 MED ORDER — ONDANSETRON HCL 4 MG/2ML IJ SOLN
INTRAMUSCULAR | Status: AC
Start: 2015-09-25 — End: 2015-09-25
  Filled 2015-09-25: qty 2

## 2015-09-25 MED ORDER — DEXTROSE 5 % IV SOLN
2.0000 g | Freq: Once | INTRAVENOUS | Status: AC
  Administered 2015-09-25: 2 g via INTRAVENOUS
  Filled 2015-09-25: qty 2

## 2015-09-25 MED ORDER — SODIUM CHLORIDE 0.9% FLUSH
3.0000 mL | Freq: Two times a day (BID) | INTRAVENOUS | Status: DC
  Filled 2015-09-25: qty 3

## 2015-09-25 MED ORDER — MIDAZOLAM HCL 2 MG/2ML IJ SOLN
INTRAMUSCULAR | Status: AC
  Filled 2015-09-25: qty 2

## 2015-09-25 MED ORDER — FENTANYL CITRATE (PF) 100 MCG/2ML IJ SOLN
25.0000 ug | INTRAMUSCULAR | Status: DC | PRN
  Administered 2015-09-25: 50 ug via INTRAVENOUS
  Filled 2015-09-25: qty 1

## 2015-09-25 MED ORDER — SODIUM CHLORIDE 0.9 % IV SOLN
INTRAVENOUS | Status: DC
  Administered 2015-09-25 (×2): via INTRAVENOUS
  Filled 2015-09-25: qty 1000

## 2015-09-25 MED ORDER — MIDAZOLAM HCL 5 MG/5ML IJ SOLN
INTRAMUSCULAR | Status: DC | PRN
  Administered 2015-09-25: 2 mg via INTRAVENOUS

## 2015-09-25 MED ORDER — PROMETHAZINE HCL 25 MG/ML IJ SOLN
6.2500 mg | INTRAMUSCULAR | Status: DC | PRN
  Filled 2015-09-25: qty 1

## 2015-09-25 MED ORDER — OXYCODONE HCL 5 MG PO TABS
ORAL_TABLET | ORAL | Status: AC
  Filled 2015-09-25: qty 1

## 2015-09-25 SURGICAL SUPPLY — 40 items
BAG DRAIN URO-CYSTO SKYTR STRL (DRAIN) ×2 IMPLANT
BAG DRN UROCATH (DRAIN) ×1
BASKET DAKOTA 1.9FR 11X120 (BASKET) ×1 IMPLANT
BASKET LASER NITINOL 1.9FR (BASKET) IMPLANT
BASKET SEGURA 3FR (UROLOGICAL SUPPLIES) IMPLANT
BASKET STNLS GEMINI 4WIRE 3FR (BASKET) IMPLANT
BASKET ZERO TIP NITINOL 2.4FR (BASKET) IMPLANT
BSKT STON RTRVL 120 1.9FR (BASKET)
BSKT STON RTRVL GEM 120X11 3FR (BASKET)
BSKT STON RTRVL ZERO TP 2.4FR (BASKET)
CANISTER SUCT LVC 12 LTR MEDI- (MISCELLANEOUS) IMPLANT
CATH URET 5FR 28IN CONE TIP (BALLOONS)
CATH URET 5FR 28IN OPEN ENDED (CATHETERS) IMPLANT
CATH URET 5FR 70CM CONE TIP (BALLOONS) IMPLANT
CLOTH BEACON ORANGE TIMEOUT ST (SAFETY) ×2 IMPLANT
ELECT REM PT RETURN 9FT ADLT (ELECTROSURGICAL)
ELECTRODE REM PT RTRN 9FT ADLT (ELECTROSURGICAL) IMPLANT
FIBER LASER FLEXIVA 1000 (UROLOGICAL SUPPLIES) IMPLANT
FIBER LASER FLEXIVA 200 (UROLOGICAL SUPPLIES) ×1 IMPLANT
FIBER LASER FLEXIVA 365 (UROLOGICAL SUPPLIES) IMPLANT
FIBER LASER FLEXIVA 550 (UROLOGICAL SUPPLIES) IMPLANT
FIBER LASER TRAC TIP (UROLOGICAL SUPPLIES) IMPLANT
GLOVE SURG SS PI 8.0 STRL IVOR (GLOVE) ×2 IMPLANT
GOWN STRL REUS W/ TWL LRG LVL3 (GOWN DISPOSABLE) ×1 IMPLANT
GOWN STRL REUS W/ TWL XL LVL3 (GOWN DISPOSABLE) ×1 IMPLANT
GOWN STRL REUS W/TWL LRG LVL3 (GOWN DISPOSABLE) ×2
GOWN STRL REUS W/TWL XL LVL3 (GOWN DISPOSABLE) ×2
GUIDEWIRE 0.038 PTFE COATED (WIRE) IMPLANT
GUIDEWIRE ANG ZIPWIRE 038X150 (WIRE) IMPLANT
GUIDEWIRE STR DUAL SENSOR (WIRE) ×2 IMPLANT
IV NS IRRIG 3000ML ARTHROMATIC (IV SOLUTION) ×4 IMPLANT
KIT BALLIN UROMAX 15FX10 (LABEL) IMPLANT
KIT BALLN UROMAX 15FX4 (MISCELLANEOUS) IMPLANT
KIT BALLN UROMAX 26 75X4 (MISCELLANEOUS)
KIT ROOM TURNOVER WOR (KITS) ×2 IMPLANT
MANIFOLD NEPTUNE II (INSTRUMENTS) IMPLANT
PACK CYSTO (CUSTOM PROCEDURE TRAY) ×2 IMPLANT
SET HIGH PRES BAL DIL (LABEL)
SHEATH ACCESS URETERAL 38CM (SHEATH) ×1 IMPLANT
TUBE CONNECTING 12X1/4 (SUCTIONS) IMPLANT

## 2015-09-25 NOTE — Brief Op Note (Signed)
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  PRE-OPERATIVE DIAGNOSIS:  LEFT PROXIMAL STONE   POST-OPERATIVE DIAGNOSIS:  LEFT PROXIMAL STONE   PROCEDURE:  Procedure(s): LEFT  URETEROSCOPY STONE EXTRACTION WITH STENT REMOVAL (Left) HOLMIUM LASER APPLICATION (Left)  SURGEON:  Surgeon(s) and Role:    * Bjorn PippinJohn Shimika Ames, MD - Primary  PHYSICIAN ASSISTANT:   ASSISTANTS: none   ANESTHESIA:   general  EBL:     BLOOD ADMINISTERED:none  DRAINS: none   LOCAL MEDICATIONS USED:  NONE  SPECIMEN:  Source of Specimen:  stone fragments  DISPOSITION OF SPECIMEN:  to patient to bring to the office  COUNTS:  YES  TOURNIQUET:  * No tourniquets in log *  DICTATION: .Other Dictation: Dictation Number [id number]  PLAN OF CARE: Discharge to home after PACU  PATIENT DISPOSITION:  PACU - hemodynamically stable.   Delay start of Pharmacological VTE agent (>24hrs) due to surgical blood loss or risk of bleeding: not applicable

## 2015-09-25 NOTE — Discharge Instructions (Addendum)
CYSTOSCOPY HOME CARE INSTRUCTIONS  Activity: Rest for the remainder of the day.  Do not drive or operate equipment today.  You may resume normal activities in one to two days as instructed by your physician.   Meals: Drink plenty of liquids and eat light foods such as gelatin or soup this evening.  You may return to a normal meal plan tomorrow.  Return to Work: You may return to work in one to two days or as instructed by your physician.  Special Instructions / Symptoms: Call your physician if any of these symptoms occur:   -persistent or heavy bleeding  -bleeding which continues after first few urination  -large blood clots that are difficult to pass  -urine stream diminishes or stops completely  -fever equal to or higher than 101 degrees Farenheit.  -cloudy urine with a strong, foul odor  -severe pain  Females should always wipe from front to back after elimination.  You may feel some burning pain when you urinate.  This should disappear with time.  Applying moist heat to the lower abdomen or a hot tub bath may help relieve the pain. \  Please bring the stone fragments to the office.    Patient Signature:  ________________________________________________________  Nurse's Signature:  ________________________________________________________  Post Anesthesia Home Care Instructions  Activity: Get plenty of rest for the remainder of the day. A responsible adult should stay with you for 24 hours following the procedure.  For the next 24 hours, DO NOT: -Drive a car -Advertising copywriterperate machinery -Drink alcoholic beverages -Take any medication unless instructed by your physician -Make any legal decisions or sign important papers.  Meals: Start with liquid foods such as gelatin or soup. Progress to regular foods as tolerated. Avoid greasy, spicy, heavy foods. If nausea and/or vomiting occur, drink only clear liquids until the nausea and/or vomiting subsides. Call your physician if vomiting  continues.  Special Instructions/Symptoms: Your throat may feel dry or sore from the anesthesia or the breathing tube placed in your throat during surgery. If this causes discomfort, gargle with warm salt water. The discomfort should disappear within 24 hours.  If you had a scopolamine patch placed behind your ear for the management of post- operative nausea and/or vomiting:  1. The medication in the patch is effective for 72 hours, after which it should be removed.  Wrap patch in a tissue and discard in the trash. Wash hands thoroughly with soap and water. 2. You may remove the patch earlier than 72 hours if you experience unpleasant side effects which may include dry mouth, dizziness or visual disturbances. 3. Avoid touching the patch. Wash your hands with soap and water after contact with the patch.

## 2015-09-25 NOTE — Transfer of Care (Signed)
Immediate Anesthesia Transfer of Care Note  Patient: Whitney Parrish  Procedure(s) Performed: Procedure(s): LEFT  URETEROSCOPY STONE EXTRACTION (Left) HOLMIUM LASER APPLICATION (Left)  Patient Location: PACU  Anesthesia Type:General  Level of Consciousness: awake, alert , oriented and patient cooperative  Airway & Oxygen Therapy: Patient Spontanous Breathing and Patient connected to nasal cannula oxygen  Post-op Assessment: Report given to RN and Post -op Vital signs reviewed and stable  Post vital signs: Reviewed and stable  Last Vitals:  Filed Vitals:   09/25/15 0625  BP: 126/74  Pulse: 90  Temp: 37 C  Resp: 16    Last Pain:  Filed Vitals:   09/25/15 0822  PainSc: 1          Complications: No apparent anesthesia complications

## 2015-09-25 NOTE — Interval H&P Note (Signed)
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 After consideration of risks, benefits and other options for treatment, the patient has consented to  Procedure(s): LEFT  URETEROSCOPY STONE EXTRACTION WITH STENT EXCHANGE (Left) HOLMIUM LASER APPLICATION (Left) as a surgical intervention .  The patient's history has been reviewed, patient examined, no change in status, stable for surgery.  I have reviewed the patient's chart and labs.  Questions were answered to the patient's satisfaction.     Salil Raineri J

## 2015-09-25 NOTE — Op Note (Deleted)
Parrish, Whitney                ACCOUNT NO.:  [id number]  MEDICAL RECORD NO.:  [id number]  LOCATION:                               FACILITY:  [facility]  PHYSICIAN:  Excell Seltzer. Annabell Howells, M.D.    DATE OF BIRTH:  Oct 22, 1955  DATE OF PROCEDURE:  09/25/2015 DATE OF DISCHARGE:                              OPERATIVE REPORT   PROCEDURE: 1. Cystoscopy with removal of left double-J stent. 2. Left ureteroscopic stone extraction with holmium lasertripsy.  PREOPERATIVE DIAGNOSIS:  Left proximal ureteral stone with obstruction.  POSTOPERATIVE DIAGNOSIS:  Left proximal ureteral stone with obstruction with ureteral stone pushed back to the kidney.  SURGEON:  Excell Seltzer. Annabell Howells, MD.  ANESTHESIA:  General.  SPECIMEN:  Stone fragments.  DRAINS:  None.  BLOOD LOSS:  None.  COMPLICATIONS:  None.  INDICATIONS:  Whitney Parrish is a 60 year old African-American female, with recent admission for an obstructing left proximal stone.  She had undergone stenting earlier this month and returns now for definitive stone removal.  FINDINGS OF PROCEDURE:  She was given Rocephin.  She was taken to the operating room where general anesthetic was induced.  She was placed in lithotomy position.  She was fitted with PAS hose.  Her perineum and genitalia were prepped with Betadine solution.  She was draped in usual sterile fashion.  Cystoscopy was performed using the 23-French scope and the 30-degree lens.  The stent was noted exiting the left ureteral orifice.  It was grasped with a grasping forceps and pulled the urethral meatus.  A guidewire was then passed to the kidney and the stent was removed. The 12-French inner core of a 38 cm access sheath was passed easily to the kidney.  This was followed by the assembled sheath.  Once the sheath was in good position, the inner core and wire were removed.  The Cobra double-lumen digital flexible scope was then passed through the sheath to the kidney.  Some small stones were  identified in the kidney.  These were removed with the [place] basket.  The larger stone that had been the obstructing stone was identified and was engaged with a [place] basket was brought to the tip of the access sheath and then fragmented with a 200 micron TracTip laser fiber at 0.8 watts and 10 hertz.  Once sufficiently fragmented, the remaining stone was removed.  At this point on fluoroscopy, there was approximately 6-7 mm calcification noted in the lower pole.  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  The Randall's plaque was engaged with the holmium laser fiber and appeared that this was just the tip of the stone that was deeply imbedded.  Some additional fragmentation was performed, but it was felt that the stone was not entirely removable because of its deeply imbedded nature.  At this point, final inspection revealed no significant residual stone fragments either endoscopically or fluoroscopically.  The ureteroscope and sheath were then backed out with the visual inspection of the ureter.  No mucosal tears or other areas of concern were noted and widely dilated ureter, so was felt a stent was not indicated.  Once the ureteroscope and sheath were removed,  the cystoscope sheath was used to drain the bladder.  The stone fragments were collected and will be given to the patient to bring to the office.  She was taken down from lithotomy position and her anesthetic was reversed.  She was moved to recovery room in stable condition.  There were no complications.     Excell SeltzerJohn J. Annabell HowellsWrenn, M.D.   ______________________________ Excell SeltzerJohn J. Annabell HowellsWrenn, M.D.    JJW/MEDQ  D:  09/25/2015  T:  09/25/2015  Job:  [id number]

## 2015-09-25 NOTE — Op Note (Signed)
NAME:  Whitney Parrish, Whitney Parrish                ACCOUNT NO.:  [id number]  MEDICAL RECORD NO.:  [id number]  LOCATION:                               FACILITY:  [facility]  PHYSICIAN:  Ariatna Jester J. Kaymon Denomme, M.D.    DATE OF BIRTH:  05/03/1955  DATE OF PROCEDURE:  09/25/2015 DATE OF DISCHARGE:                              OPERATIVE REPORT   PROCEDURE: 1. Cystoscopy with removal of left double-J stent. 2. Left ureteroscopic stone extraction with holmium lasertripsy.  PREOPERATIVE DIAGNOSIS:  Left proximal ureteral stone with obstruction.  POSTOPERATIVE DIAGNOSIS:  Left proximal ureteral stone with obstruction with ureteral stone pushed back to the kidney.  SURGEON:  Blue Ruggerio J. Addilynn Mowrer, MD.  ANESTHESIA:  General.  SPECIMEN:  Stone fragments.  DRAINS:  None.  BLOOD LOSS:  None.  COMPLICATIONS:  None.  INDICATIONS:  Ms. Pung is a 59-year-old African-American female, with recent admission for an obstructing left proximal stone.  She had undergone stenting earlier this month and returns now for definitive stone removal.  FINDINGS OF PROCEDURE:  She was given Rocephin.  She was taken to the operating room where general anesthetic was induced.  She was placed in lithotomy position.  She was fitted with PAS hose.  Her perineum and genitalia were prepped with Betadine solution.  She was draped in usual sterile fashion.  Cystoscopy was performed using the 23-French scope and the 30-degree lens.  The stent was noted exiting the left ureteral orifice.  It was grasped with a grasping forceps and pulled the urethral meatus.  A guidewire was then passed to the kidney and the stent was removed. The 12-French inner core of a 38 cm access sheath was passed easily to the kidney.  This was followed by the assembled sheath.  Once the sheath was in good position, the inner core and wire were removed.  The Cobra double-lumen digital flexible scope was then passed through the sheath to the kidney.  Some small stones were  identified in the kidney.  These were removed with the [place] basket.  The larger stone that had been the obstructing stone was identified and was engaged with a [place] basket was brought to the tip of the access sheath and then fragmented with a 200 micron TracTip laser fiber at 0.8 watts and 10 hertz.  Once sufficiently fragmented, the remaining stone was removed.  At this point on fluoroscopy, there was approximately 6-7 mm calcification noted in the lower pole.  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  The Randall's plaque was engaged with the holmium laser fiber and appeared that this was just the tip of the stone that was deeply imbedded.  Some additional fragmentation was performed, but it was felt that the stone was not entirely removable because of its deeply imbedded nature.  At this point, final inspection revealed no significant residual stone fragments either endoscopically or fluoroscopically.  The ureteroscope and sheath were then backed out with the visual inspection of the ureter.  No mucosal tears or other areas of concern were noted and widely dilated ureter, so was felt a stent was not indicated.  Once the ureteroscope and sheath were removed,   the cystoscope sheath was used to drain the bladder.  The stone fragments were collected and will be given to the patient to bring to the office.  She was taken down from lithotomy position and her anesthetic was reversed.  She was moved to recovery room in stable condition.  There were no complications.     Excell SeltzerJohn J. Annabell HowellsWrenn, M.D.   ______________________________ Excell SeltzerJohn J. Annabell HowellsWrenn, M.D.    JJW/MEDQ  D:  09/25/2015  T:  09/25/2015  Job:  [id number]

## 2015-09-25 NOTE — Anesthesia Preprocedure Evaluation (Addendum)
Anesthesia Evaluation  Patient identified by MRN, date of birth, ID band Patient awake    Reviewed: Allergy & Precautions, NPO status , Patient's Chart, lab work & pertinent test results  Airway Mallampati: II  TM Distance: >3 FB Neck ROM: Full    Dental no notable dental hx. (+) Loose, Missing, Dental Advisory Given,    Pulmonary shortness of breath, Current Smoker,    Pulmonary exam normal breath sounds clear to auscultation       Cardiovascular negative cardio ROS Normal cardiovascular exam Rhythm:Regular Rate:Normal     Neuro/Psych  Headaches, PSYCHIATRIC DISORDERS Anxiety Depression    GI/Hepatic negative GI ROS, Neg liver ROS,   Endo/Other  negative endocrine ROS  Renal/GU Renal InsufficiencyRenal disease  negative genitourinary   Musculoskeletal  (+) Arthritis ,   Abdominal   Peds negative pediatric ROS (+)  Hematology negative hematology ROS (+)   Anesthesia Other Findings   Reproductive/Obstetrics negative OB ROS                           Anesthesia Physical Anesthesia Plan  ASA: III  Anesthesia Plan: General   Post-op Pain Management:    Induction: Intravenous  Airway Management Planned: LMA  Additional Equipment:   Intra-op Plan:   Post-operative Plan: Extubation in OR  Informed Consent: I have reviewed the patients History and Physical, chart, labs and discussed the procedure including the risks, benefits and alternatives for the proposed anesthesia with the patient or authorized representative who has indicated his/her understanding and acceptance.   Dental advisory given  Plan Discussed with: CRNA and Anesthesiologist  Anesthesia Plan Comments:        Anesthesia Quick Evaluation

## 2015-09-25 NOTE — H&P (View-Only) (Signed)
Subjective: CC: Left flank pain.  Hx: Whitney Parrish is a 60 yo female who was transferred from [facility] with a 7mm left proximal stone with obstruction.   Her pain has been poorly controlled and she had a low grade fever with a leukocytosis and AKI with a Cr of 2.73.  Her baseline is elevated at 1.8 and she has some chronic dilation of the right collecting system as well.   She had onset of the pain yesterday and had nausea with the pain but no hematuria or voiding complaints.   Her UA is unremarkable.   He lactate is borderline at 1.95.   She has been told she had stone in the past but has not seen a urologist and denies GU surgery.   She has Psoriasis and is on Humira for that.  She has a history of CP and is on chronic nitrates but her EKG is stable and she has no chest pain at this time.  ROS:  Review of Systems  Constitutional: Negative for fever and chills.  Respiratory: Negative for shortness of breath.   Cardiovascular: Negative for chest pain, palpitations and leg swelling.  Gastrointestinal: Positive for nausea, abdominal pain and constipation (she generally has frequent stools but none in 2 days. ). Negative for diarrhea.  Genitourinary: Positive for flank pain. Negative for dysuria, urgency, frequency and hematuria.  Musculoskeletal: Negative.   Skin: Positive for rash (psoriasis).  Neurological: Positive for focal weakness (left sided Bell's palsy).  Endo/Heme/Allergies: Negative for environmental allergies. Does not bruise/bleed easily.  All other systems reviewed and are negative.   Allergies  Allergen Reactions  . Sulfa Antibiotics Other (See Comments)    hallucinations    Past Medical History  Diagnosis Date  . Breast abscess 10/2011    left breast  . Chest pain     last chest pain 7-15 2013 admit to [facility] for 3 days  . Depression   . Shortness of breath   . Neuromuscular disorder     bells palsy  . Anxiety   . Bell's palsy 1980's    left side  . Headache(784.0)      migraine  . Arthritis     shoulders and hands  . Vertigo   . Sleep apnea 2007    no cpap given to use per pt    Past Surgical History  Procedure Laterality Date  . Left breast abscess i&d  10/2011  . Uterine fibroid surgery  1986  . Abdominal surgery for scar tissue  2007 or 2008  . Breast cyst excision  12/15/2011    Procedure: CYST EXCISION BREAST;  Surgeon: Shelly Rubenstein, MD;  Location: [facility];  Service: General;  Laterality: Left;  Excision of a Chronic Left Breast Sebaceous Cyst    Social History   Social History  . Marital Status: Divorced    Spouse Name: N/A  . Number of Children: N/A  . Years of Education: N/A   Occupational History  . Not on file.   Social History Main Topics  . Smoking status: Current Every Day Smoker -- 0.25 packs/day for 30 years    Types: Cigarettes  . Smokeless tobacco: Never Used     Comment: quitting on her own "    QUITTING ON HER OWN "  . Alcohol Use: No  . Drug Use: No  . Sexual Activity: Not Currently    Birth Control/ Protection: Post-menopausal   Other Topics Concern  . Not on file   Social  History Narrative    Family History  Problem Relation Age of Onset  . Cancer Mother     Stomach  . Cancer Father     Unknown   Past, family and social history reviewed.  Med list reviewed in Chart review but home meds have not been reconciled.  Anti-infectives: Anti-infectives    Start     Dose/Rate Route Frequency Ordered Stop   09/08/15 2026  cefTRIAXone (ROCEPHIN) 1 g in dextrose 5 % 50 mL IVPB     1 g 100 mL/hr over 30 Minutes Intravenous 30 min pre-op 09/08/15 2026        Current Facility-Administered Medications  Medication Dose Route Frequency Provider Last Rate Last Dose  . Barium Sulfate 2.1 % SUSP 450 mL  450 mL Oral Once Sonic AutomotiveShawn C Joy, PA-C      . Barium Sulfate 2.1 % SUSP           . cefTRIAXone (ROCEPHIN) 1 g in dextrose 5 % 50 mL IVPB  1 g Intravenous 30 min Pre-Op Bjorn PippinJohn Christropher Gintz, MD      . HYDROmorphone  (DILAUDID) injection 1 mg  1 mg Intravenous Q4H PRN Arthor CaptainAbigail Harris, PA-C   1 mg at 09/08/15 1951  . ondansetron (ZOFRAN) injection 4 mg  4 mg Intravenous Q8H PRN Arthor CaptainAbigail Harris, PA-C   4 mg at 09/08/15 1951     Objective: Vital signs in last 24 hours: Temp:  [97.6 F (36.4 C)-99.4 F (37.4 C)] 97.6 F (36.4 C) (05/08 2055) Pulse Rate:  [80-111] 81 (05/08 2055) Resp:  [14-21] 17 (05/08 1915) BP: (132-174)/(83-120) 160/89 mmHg (05/08 2055) SpO2:  [92 %-100 %] 98 % (05/08 2055) Weight:  [64.955 kg (143 lb 3.2 oz)-66.225 kg (146 lb)] 64.955 kg (143 lb 3.2 oz) (05/08 2055)  Intake/Output from previous day:   Intake/Output this shift:     Physical Exam  Constitutional: She is oriented to person, place, and time and well-developed, well-nourished, and in no distress.  HENT:  Head: Normocephalic and atraumatic.  Neck: Normal range of motion. Neck supple. No thyromegaly present.  Cardiovascular: Normal rate, regular rhythm and normal heart sounds.   Pulmonary/Chest: Effort normal and breath sounds normal. No respiratory distress.  Abdominal: Soft. She exhibits no mass. There is tenderness (diffuse, left greater than right with LCVAT. ). There is guarding.  Musculoskeletal: Normal range of motion. She exhibits no edema or tenderness.  Lymphadenopathy:    She has no cervical adenopathy.  Neurological: She is alert and oriented to person, place, and time.  Skin: Skin is warm and dry. Rash (psoriasis on her elbows and legs) noted.  Psychiatric: Mood and affect normal.  Vitals reviewed.   Lab Results:   Recent Labs  09/08/15 1057  WBC 14.8*  HGB 14.1  HCT 43.8  PLT 285   BMET  Recent Labs  09/08/15 1057  NA 138  K 4.4  CL 105  CO2 20*  GLUCOSE 150*  BUN 12  CREATININE 2.73*  CALCIUM 9.3   PT/INR No results for input(s): LABPROT, INR in the last 72 hours. ABG No results for input(s): PHART, HCO3 in the last 72 hours.  Invalid input(s): PCO2,  PO2  Studies/Results: Ct Abdomen Pelvis Wo Contrast  09/08/2015  CLINICAL DATA:  Abdominal pain, onset yesterday. History of kidney stones. EXAM: CT ABDOMEN AND PELVIS WITHOUT CONTRAST TECHNIQUE: Multidetector CT imaging of the abdomen and pelvis was performed following the standard protocol without IV contrast. COMPARISON:  08/30/2008 FINDINGS: Lower chest: Stable  pleural-based 3 mm nodule in the left lower lobe on sequence 3, image 9. This is benign based on the stability. No pleural effusions. Hepatobiliary: No acute abnormality to the liver or gallbladder. Pancreas: Normal appearance of the pancreas without inflammation or duct dilatation. Spleen: Normal appearance of spleen without enlargement Adrenals/Urinary Tract: Normal adrenal glands. Mild to moderate left hydronephrosis due to a 7 mm stone in the proximal left ureter. There is left perinephric edema. There is a 8 mm stone in the left kidney lower pole and a 3 mm stone in the left interpolar region. Probable bilateral renal cysts which are poorly characterized on this non contrast examination. Fullness of the right renal pelvis appears unchanged and there is no evidence for right kidney or right ureter stones. There is a punctate calcification along the medial aspect of the right kidney but this could be associated with a cyst. Normal appearance of the urinary bladder. Stomach/Bowel: Evidence for an appendectomy. No evidence for bowel obstruction. Vascular/Lymphatic: Atherosclerotic calcifications in the abdominal aorta without aneurysm. No suspicious lymphadenopathy. Reproductive: Uterus has been removed. Normal appearance of the left ovary. The right ovary is likely surgically absent. Other: No free fluid.  No free air. Musculoskeletal: Degenerative facet disease in lower lumbar spine. IMPRESSION: Mild to moderate left hydronephrosis with left perinephric edema due to a 7 mm stone in the proximal left ureter. Left nephrolithiasis. Chronic fullness of  the right renal pelvis without right kidney stones. Electronically Signed   By: Richarda Overlie M.D.   On: 09/08/2015 16:42   Dg Chest 2 View  09/08/2015  CLINICAL DATA:  Chest pain intermittently since 09/06/2015. EXAM: CHEST  2 VIEW COMPARISON:  PA and lateral chest 09/05/2014 and 04/03/2014. FINDINGS: The lungs are clear. Heart size is normal. No pneumothorax or pleural effusion. No focal bony abnormality. IMPRESSION: Negative chest. Electronically Signed   By: Drusilla Kanner M.D.   On: 09/08/2015 11:39   I have reviewed her labs and CT films and report and discussed her case with Harolyn Rutherford PA in the ER.   Assessment: She has a 7mm obstructing left proximal stone with persistent pain and AKI.   She had a low grade fever and leukocytosis.   She has left renal stones with an 8mm LLP stone.   I am going to take her to the OR for cystoscopy with left stent insertion and reviewed the risks of bleeding, infection, ureteral injury, need for a perc, need for later definitive stone treatment, thrombotic events and anesthetic risks.         Starlit Raburn J 09/08/2015 [phone]

## 2015-09-25 NOTE — Anesthesia Procedure Notes (Signed)
Procedure Name: LMA Insertion Date/Time: 09/25/2015 7:31 AM Performed by: Tyrone NineSAUVE, Takiah Maiden F Pre-anesthesia Checklist: Patient identified, Timeout performed, Emergency Drugs available, Suction available and Patient being monitored Patient Re-evaluated:Patient Re-evaluated prior to inductionOxygen Delivery Method: Circle system utilized Preoxygenation: Pre-oxygenation with 100% oxygen Intubation Type: IV induction Ventilation: Mask ventilation without difficulty LMA: LMA inserted LMA Size: 4.0 Number of attempts: 1 Placement Confirmation: positive ETCO2 and breath sounds checked- equal and bilateral Tube secured with: Tape Dental Injury: Teeth and Oropharynx as per pre-operative assessment

## 2015-09-25 NOTE — Anesthesia Postprocedure Evaluation (Signed)
Anesthesia Post Note  Patient: Whitney Parrish  Procedure(s) Performed: Procedure(s) (LRB): LEFT  URETEROSCOPY STONE EXTRACTION (Left) HOLMIUM LASER APPLICATION (Left)  Patient location during evaluation: PACU Anesthesia Type: General Level of consciousness: awake and alert Pain management: pain level controlled Vital Signs Assessment: post-procedure vital signs reviewed and stable Respiratory status: spontaneous breathing, nonlabored ventilation, respiratory function stable and patient connected to nasal cannula oxygen Cardiovascular status: blood pressure returned to baseline and stable Postop Assessment: no signs of nausea or vomiting Anesthetic complications: no    Last Vitals:  Filed Vitals:   09/25/15 0930 09/25/15 1100  BP: 117/81 123/77  Pulse: 71 85  Temp:  36.8 C  Resp: 13 12    Last Pain:  Filed Vitals:   09/25/15 1115  PainSc: 5                  Heran Campau J

## 2015-09-26 ENCOUNTER — Encounter (HOSPITAL_BASED_OUTPATIENT_CLINIC_OR_DEPARTMENT_OTHER): Payer: Self-pay | Admitting: Urology

## 2016-02-25 ENCOUNTER — Other Ambulatory Visit: Payer: Self-pay | Admitting: Cardiovascular Disease

## 2016-02-25 ENCOUNTER — Ambulatory Visit
Admission: RE | Admit: 2016-02-25 | Discharge: 2016-02-25 | Disposition: A | Discharge status: Home / Self Care | Payer: Medicare Other | Source: Ambulatory Visit | Attending: Cardiovascular Disease | Admitting: Cardiovascular Disease

## 2016-02-25 DIAGNOSIS — R059 Cough, unspecified: Secondary | ICD-10-CM

## 2016-02-25 DIAGNOSIS — R0789 Other chest pain: Secondary | ICD-10-CM

## 2016-02-25 DIAGNOSIS — R05 Cough: Secondary | ICD-10-CM

## 2017-05-08 ENCOUNTER — Encounter (HOSPITAL_COMMUNITY): Payer: Self-pay

## 2017-05-08 ENCOUNTER — Emergency Department (HOSPITAL_COMMUNITY)
Admission: EM | Admit: 2017-05-08 | Discharge: 2017-05-08 | Disposition: A | Discharge status: Home / Self Care | Payer: Medicare Other | Attending: Emergency Medicine | Admitting: Emergency Medicine

## 2017-05-08 ENCOUNTER — Emergency Department (HOSPITAL_COMMUNITY): Payer: Medicare Other

## 2017-05-08 ENCOUNTER — Other Ambulatory Visit: Payer: Self-pay

## 2017-05-08 DIAGNOSIS — Z7982 Long term (current) use of aspirin: Secondary | ICD-10-CM | POA: Diagnosis not present

## 2017-05-08 DIAGNOSIS — N183 Chronic kidney disease, stage 3 (moderate): Secondary | ICD-10-CM | POA: Insufficient documentation

## 2017-05-08 DIAGNOSIS — R05 Cough: Secondary | ICD-10-CM | POA: Diagnosis not present

## 2017-05-08 DIAGNOSIS — F17210 Nicotine dependence, cigarettes, uncomplicated: Secondary | ICD-10-CM | POA: Diagnosis not present

## 2017-05-08 DIAGNOSIS — Z79899 Other long term (current) drug therapy: Secondary | ICD-10-CM | POA: Diagnosis not present

## 2017-05-08 DIAGNOSIS — J039 Acute tonsillitis, unspecified: Secondary | ICD-10-CM | POA: Insufficient documentation

## 2017-05-08 DIAGNOSIS — J029 Acute pharyngitis, unspecified: Secondary | ICD-10-CM | POA: Diagnosis present

## 2017-05-08 DIAGNOSIS — R059 Cough, unspecified: Secondary | ICD-10-CM

## 2017-05-08 LAB — RAPID STREP SCREEN (MED CTR MEBANE ONLY): Streptococcus, Group A Screen (Direct): NEGATIVE

## 2017-05-08 MED ORDER — PENICILLIN V POTASSIUM 500 MG PO TABS: 500.0000 mg | ORAL_TABLET | Freq: Two times a day (BID) | ORAL | 0 refills | Status: AC

## 2017-05-08 MED ORDER — BENZONATATE 100 MG PO CAPS: 100.0000 mg | ORAL_CAPSULE | Freq: Three times a day (TID) | ORAL | 0 refills | Status: DC | PRN

## 2017-05-08 NOTE — ED Provider Notes (Signed)
[name] EMERGENCY DEPARTMENT Provider Note   CSN: [id number] Arrival date & time: 05/08/17  1005     History   Chief Complaint No chief complaint on file.   HPI Whitney Parrish is a 62 y.o. female.  HPI Patient presents with 2 days of sore throat.  Pain is worse with swallowing.  She did have some swelling lymph nodes on the right side but these have improved.  Admits to subjective fevers and chills but no documented fever.  Patient states she has chronic cough which is worsened slightly.  Is nonproductive.  No known sick contacts. Past Medical History:  Diagnosis Date  . Anxiety   . Arthritis    shoulders and hands  . Benign positional vertigo   . Chronically dry eyes   . CKD (chronic kidney disease), stage III (HCC)   . Depression   . Dyspnea on exertion   . Hematuria   . History of acute pyelonephritis   . History of Bell's palsy    1980's left side-- residual lip numbness intermittant  . History of chest pain    non-cardiac per dr Algie Coffer notes (pt's pcp)  . History of panic attacks   . Left ureteral stone   . Loose bowel movements    chronic-per pt residual from injury during abdominal surgery in 1986  . Migraines   . Nephrolithiasis    left   . Psoriasis   . Urgency of urination   . Wears dentures    has upper and lower but only wears upper    Patient Active Problem List   Diagnosis Date Noted  . Acute pyonephrosis 09/09/2015  . Chronic renal insufficiency 09/09/2015  . Acute renal insufficiency 09/09/2015  . Obstructive uropathy 09/08/2015  . Left nephrolithiasis 09/08/2015  . Chest pain at rest 11/15/2011  . Dizziness 11/15/2011  . Breast abscess 10/06/2011    Past Surgical History:  Procedure Laterality Date  . BREAST CYST EXCISION  12/15/2011   Procedure: CYST EXCISION BREAST;  Surgeon: Shelly Rubenstein, MD;  Location: [facility];  Service: General;  Laterality: Left;  Excision of a Chronic Left Breast Sebaceous Cyst  .  CARDIOVASCULAR STRESS TEST  12-28-2012   Low risk nuclear study w/ small LV cavity which may be due LVH/  no ischemia or infarct/  normal wall motion, ef 67%  . CYSTOSCOPY WITH STENT PLACEMENT Left 09/08/2015   Procedure: CYSTOSCOPY WITH LEFT STENT PLACEMENT;  Surgeon: Bjorn Pippin, MD;  Location: [facility];  Service: Urology;  Laterality: Left;  . CYSTOSCOPY WITH URETEROSCOPY AND STENT PLACEMENT Left 09/25/2015   Procedure: LEFT  URETEROSCOPY STONE EXTRACTION;  Surgeon: Bjorn Pippin, MD;  Location: [facility];  Service: Urology;  Laterality: Left;  . EXCISION AND REVISION SCAR ABDOMINAL WALL  01-22-2002  . EXPLORATORY LAPAROTOMY  1986   w/ Abdominal Myomectomy  (per pt bowel muscle cut (injury))  . HOLMIUM LASER APPLICATION Left 09/25/2015   Procedure: HOLMIUM LASER APPLICATION;  Surgeon: Bjorn Pippin, MD;  Location: [facility];  Service: Urology;  Laterality: Left;  . I & D LEFT BUTTOCK ABSCESS   07-01-2010   x2 area's  . NEGATIVE SLEEP STUDY  08-10-2005  . TRANSTHORACIC ECHOCARDIOGRAM  12-28-2012   grade 1 diastolic dysfunction, mild concentric LVH, ef 65-70%/  mild MR/  trivial PR and TR    OB History    No data available       Home Medications    Prior to  Admission medications   Medication Sig Start Date End Date Taking? Authorizing Provider  acitretin (SORIATANE) 25 MG capsule Take 25 mg by mouth every evening.     [provider]  adalimumab (HUMIRA) 40 MG/0.8ML injection Inject 40 mg into the skin every 14 (fourteen) days. Every other Wednesday, last injection 12/20/12    [provider]  albuterol (PROVENTIL HFA;VENTOLIN HFA) 108 (90 BASE) MCG/ACT inhaler Inhale 1-2 puffs into the lungs every 6 (six) hours as needed for wheezing or shortness of breath.    [provider]  ALPRAZolam Prudy Feeler) 0.25 MG tablet Take 0.25 mg by mouth daily as needed.     [provider]  aspirin 81 MG tablet Take 81 mg by mouth daily.     [provider]  benzonatate (TESSALON) 100 MG capsule Take 1 capsule (100 mg total) by mouth 3 (three) times daily as needed for cough. 05/08/17   Loren Racer, MD  Coal Tar Extract 2 % OINT Apply 1 application topically daily.    [provider]  cycloSPORINE (RESTASIS) 0.05 % ophthalmic emulsion Place 1 drop into both eyes 2 (two) times daily.    [provider]  dicyclomine (BENTYL) 10 MG capsule Take 10 mg by mouth 2 (two) times daily.     [provider]  halobetasol (ULTRAVATE) 0.05 % cream Apply 1 application topically 2 (two) times daily as needed (for psoriasis).    [provider]  isosorbide mononitrate (IMDUR) 30 MG 24 hr tablet Take 15-30 mg by mouth daily as needed (only when needed). Takes 1/2 tab, next day 1/2 tab and on 3rd day takes 1 tablet and repeats as needed    [provider]  meclizine (ANTIVERT) 25 MG tablet Take 25 mg by mouth 3 (three) times daily.    [provider]  metoprolol tartrate (LOPRESSOR) 25 MG tablet Take 25 mg by mouth 2 (two) times daily.    [provider]  nitroGLYCERIN (NITROSTAT) 0.4 MG SL tablet Place 1 tablet (0.4 mg total) under the tongue every 5 (five) minutes x 3 doses as needed for chest pain. 12/29/12   [name], MD  Olopatadine HCl 0.2 % SOLN Apply 1 drop to eye every morning.    [provider]  oxyCODONE (ROXICODONE) 15 MG immediate release tablet Take 15 mg by mouth every 4 (four) hours as needed for pain.    [provider]  penicillin v potassium (VEETID) 500 MG tablet Take 1 tablet (500 mg total) by mouth 2 (two) times daily for 7 days. 05/08/17 05/15/17  Loren Racer, MD  venlafaxine (EFFEXOR) 75 MG tablet Take 75 mg by mouth every evening.    [provider]    Family History Family History  Problem Relation Age of Onset  . Cancer Mother        Stomach  . Cancer Father        Unknown    Social History Social History    Tobacco Use  . Smoking status: Current Every Day Smoker    Packs/day: 0.25    Years: 30.00    Pack years: 7.50    Types: Cigarettes  . Smokeless tobacco: Never Used  . Tobacco comment: " QUITTING ON HER OWN "  down to 5 cig daily  Substance Use Topics  . Alcohol use: No  . Drug use: No     Allergies   Sulfa antibiotics   Review of Systems Review of Systems  Constitutional: Positive for chills,  fatigue and fever.  HENT: Positive for sore throat. Negative for congestion, rhinorrhea, sinus pressure and sinus pain.   Eyes: Negative for visual disturbance.  Respiratory: Positive for cough. Negative for shortness of breath and wheezing.   Cardiovascular: Negative for chest pain and leg swelling.  Gastrointestinal: Negative for abdominal pain, nausea and vomiting.  Musculoskeletal: Positive for neck pain. Negative for back pain, myalgias and neck stiffness.  Skin: Negative for rash and wound.  Neurological: Negative for dizziness, weakness, light-headedness, numbness and headaches.  All other systems reviewed and are negative.    Physical Exam Updated Vital Signs BP (!) 167/108 (BP Location: Right Arm)   Pulse 90   Temp 99.2 F (37.3 C) (Oral)   Resp 20   SpO2 100%   Physical Exam  Constitutional: She is oriented to person, place, and time. She appears well-developed and well-nourished. No distress.  HENT:  Head: Normocephalic and atraumatic.  Mouth/Throat: Oropharynx is clear and moist.  Bilateral tonsillar hypertrophy and erythema.  Small amount of tonsillar exudates on the right tonsil.  Uvula is midline.  Eyes: EOM are normal. Pupils are equal, round, and reactive to light.  Neck: Normal range of motion. Neck supple.  Right anterior cervical lymphadenopathy.  No meningismus.  Cardiovascular: Normal rate and regular rhythm.  Pulmonary/Chest: Effort normal and breath sounds normal. No stridor. No respiratory distress. She has no wheezes. She has no rales. She  exhibits no tenderness.  Abdominal: Soft. Bowel sounds are normal. There is no tenderness. There is no rebound and no guarding.  Musculoskeletal: Normal range of motion. She exhibits no edema or tenderness.  Lymphadenopathy:    She has cervical adenopathy.  Neurological: She is alert and oriented to person, place, and time.  Skin: Skin is warm and dry. No rash noted. She is not diaphoretic. No erythema.  Psychiatric: She has a normal mood and affect. Her behavior is normal.  Nursing note and vitals reviewed.    ED Treatments / Results  Labs (all labs ordered are listed, but only abnormal results are displayed) Labs Reviewed  RAPID STREP SCREEN (NOT AT [facility])  CULTURE, GROUP A STREP [facility])    EKG  EKG Interpretation None       Radiology Dg Chest 2 View  Result Date: 05/08/2017 CLINICAL DATA:  Sore throat, congestion, and cough for 2 days, painful to swallow, history smoking EXAM: CHEST  2 VIEW COMPARISON:  02/25/2016 FINDINGS: Normal heart size, mediastinal contours, and pulmonary vascularity. Lungs clear. No pleural effusion or pneumothorax. Bones unremarkable. IMPRESSION: Normal exam Electronically Signed   By: Ulyses SouthwardMark  Boles M.D.   On: 05/08/2017 11:07    Procedures Procedures (including critical care time)  Medications Ordered in ED Medications - No data to display   Initial Impression / Assessment and Plan / ED Course  I have reviewed the triage vital signs and the nursing notes.  Pertinent labs & imaging results that were available during my care of the patient were reviewed by me and considered in my medical decision making (see chart for details).     Patient is well-appearing.  X-ray without acute findings.  Even though rapid strep came back negative though patient does have tonsillar exudates and lymphadenopathy.  Will treat for strep tonsillitis.  Return precautions given.  Final Clinical Impressions(s) / ED Diagnoses   Final diagnoses:  Tonsillitis  Cough     ED Discharge Orders        Ordered    penicillin v potassium (VEETID) 500 MG  tablet  2 times daily     05/08/17 1258    benzonatate (TESSALON) 100 MG capsule  3 times daily PRN     05/08/17 1258       Loren Racer, MD 05/08/17 1258

## 2017-05-08 NOTE — ED Triage Notes (Signed)
Patient complains of sore throat and congestion with cough x 2 days, painful to swallow, no fever

## 2017-05-10 LAB — CULTURE, GROUP A STREP (THRC)

## 2017-09-23 ENCOUNTER — Encounter (HOSPITAL_COMMUNITY): Payer: Self-pay

## 2017-09-23 ENCOUNTER — Other Ambulatory Visit: Payer: Self-pay

## 2017-09-23 ENCOUNTER — Emergency Department (HOSPITAL_COMMUNITY): Payer: Medicare Other

## 2017-09-23 ENCOUNTER — Emergency Department (HOSPITAL_COMMUNITY)
Admission: EM | Admit: 2017-09-23 | Discharge: 2017-09-23 | Disposition: A | Discharge status: Home / Self Care | Payer: Medicare Other | Attending: Emergency Medicine | Admitting: Emergency Medicine

## 2017-09-23 DIAGNOSIS — N183 Chronic kidney disease, stage 3 (moderate): Secondary | ICD-10-CM | POA: Diagnosis not present

## 2017-09-23 DIAGNOSIS — R7989 Other specified abnormal findings of blood chemistry: Secondary | ICD-10-CM

## 2017-09-23 DIAGNOSIS — N201 Calculus of ureter: Secondary | ICD-10-CM | POA: Diagnosis not present

## 2017-09-23 DIAGNOSIS — Z7982 Long term (current) use of aspirin: Secondary | ICD-10-CM | POA: Insufficient documentation

## 2017-09-23 DIAGNOSIS — Z79899 Other long term (current) drug therapy: Secondary | ICD-10-CM | POA: Diagnosis not present

## 2017-09-23 DIAGNOSIS — F17210 Nicotine dependence, cigarettes, uncomplicated: Secondary | ICD-10-CM | POA: Diagnosis not present

## 2017-09-23 LAB — COMPREHENSIVE METABOLIC PANEL
ALT: 18 U/L (ref 14–54)
AST: 19 U/L (ref 15–41)
Albumin: 4 g/dL (ref 3.5–5.0)
Alkaline Phosphatase: 75 U/L (ref 38–126)
Anion gap: 13 (ref 5–15)
BUN: 14 mg/dL (ref 6–20)
CO2: 19 mmol/L — ABNORMAL LOW (ref 22–32)
Calcium: 9.3 mg/dL (ref 8.9–10.3)
Chloride: 106 mmol/L (ref 101–111)
Creatinine, Ser: 2.95 mg/dL — ABNORMAL HIGH (ref 0.44–1.00)
GFR calc Af Amer: 19 mL/min — ABNORMAL LOW (ref >60)
GFR calc non Af Amer: 16 mL/min — ABNORMAL LOW (ref >60)
Glucose, Bld: 170 mg/dL — ABNORMAL HIGH (ref 65–99)
Potassium: 3.8 mmol/L (ref 3.5–5.1)
Sodium: 138 mmol/L (ref 135–145)
Total Bilirubin: 0.7 mg/dL (ref 0.3–1.2)
Total Protein: 8.2 g/dL — ABNORMAL HIGH (ref 6.5–8.1)

## 2017-09-23 LAB — LIPASE, BLOOD: Lipase: 33 U/L (ref 11–51)

## 2017-09-23 LAB — URINALYSIS, ROUTINE W REFLEX MICROSCOPIC
Bilirubin Urine: NEGATIVE
Glucose, UA: NEGATIVE mg/dL
Ketones, ur: NEGATIVE mg/dL
Nitrite: NEGATIVE
Protein, ur: 30 mg/dL — AB
RBC / HPF: >50 RBC/hpf — ABNORMAL HIGH (ref 0–5)
Specific Gravity, Urine: 1.014 (ref 1.005–1.030)
pH: 5 (ref 5.0–8.0)

## 2017-09-23 LAB — CBC
HCT: 44.6 % (ref 36.0–46.0)
Hemoglobin: 14.1 g/dL (ref 12.0–15.0)
MCH: 29 pg (ref 26.0–34.0)
MCHC: 31.6 g/dL (ref 30.0–36.0)
MCV: 91.6 fL (ref 78.0–100.0)
Platelets: 270 10*3/uL (ref 150–400)
RBC: 4.87 MIL/uL (ref 3.87–5.11)
RDW: 13.1 % (ref 11.5–15.5)
WBC: 9.9 10*3/uL (ref 4.0–10.5)

## 2017-09-23 MED ORDER — KETOROLAC TROMETHAMINE 15 MG/ML IJ SOLN
15.0000 mg | Freq: Once | INTRAMUSCULAR | Status: AC
  Administered 2017-09-23: 15 mg via INTRAVENOUS
  Filled 2017-09-23: qty 1

## 2017-09-23 MED ORDER — MORPHINE SULFATE (PF) 4 MG/ML IV SOLN
4.0000 mg | Freq: Once | INTRAVENOUS | Status: AC
  Administered 2017-09-23: 4 mg via INTRAVENOUS
  Filled 2017-09-23: qty 1

## 2017-09-23 MED ORDER — HYDROCODONE-ACETAMINOPHEN 5-325 MG PO TABS: 1.0000 | ORAL_TABLET | Freq: Four times a day (QID) | ORAL | 0 refills | Status: DC | PRN

## 2017-09-23 MED ORDER — ONDANSETRON HCL 4 MG/2ML IJ SOLN
4.0000 mg | Freq: Once | INTRAMUSCULAR | Status: AC
  Administered 2017-09-23: 4 mg via INTRAVENOUS
  Filled 2017-09-23: qty 2

## 2017-09-23 NOTE — ED Notes (Signed)
Dr Butler at bedside.  

## 2017-09-23 NOTE — ED Triage Notes (Signed)
Pt states that she has been having pain in her left lower abdomen since Wednesday . Pt states that this feels like when she had kidney stones on the right last year.

## 2017-09-23 NOTE — ED Notes (Signed)
ED Provider at bedside. 

## 2017-09-23 NOTE — ED Notes (Signed)
Patient transported to CT 

## 2017-09-23 NOTE — ED Provider Notes (Signed)
[name] EMERGENCY DEPARTMENT Provider Note   CSN: [id number] Arrival date & time: 09/23/17  1057    History   Chief Complaint Chief Complaint  Patient presents with  . Abdominal Pain    HPI Whitney Parrish is a 62 y.o. female.  The history is provided by the patient.  Abdominal Pain   This is a recurrent problem. The current episode started 2 days ago. The problem occurs constantly. The problem has not changed since onset.The pain is associated with an unknown factor. The pain is located in the LLQ. The quality of the pain is throbbing. The pain is severe. Associated symptoms include nausea. Pertinent negatives include fever, vomiting, dysuria, hematuria and arthralgias. The symptoms are aggravated by palpation. Nothing relieves the symptoms. Past workup includes CT scan.    Past Medical History:  Diagnosis Date  . Anxiety   . Arthritis    shoulders and hands  . Benign positional vertigo   . Chronically dry eyes   . CKD (chronic kidney disease), stage III (HCC)   . Depression   . Dyspnea on exertion   . Hematuria   . History of acute pyelonephritis   . History of Bell's palsy    1980's left side-- residual lip numbness intermittant  . History of chest pain    non-cardiac per dr Algie Coffer notes (pt's pcp)  . History of panic attacks   . Left ureteral stone   . Loose bowel movements    chronic-per pt residual from injury during abdominal surgery in 1986  . Migraines   . Nephrolithiasis    left   . Psoriasis   . Urgency of urination   . Wears dentures    has upper and lower but only wears upper    Patient Active Problem List   Diagnosis Date Noted  . Acute pyonephrosis 09/09/2015  . Chronic renal insufficiency 09/09/2015  . Acute renal insufficiency 09/09/2015  . Obstructive uropathy 09/08/2015  . Left nephrolithiasis 09/08/2015  . Chest pain at rest 11/15/2011  . Dizziness 11/15/2011  . Breast abscess 10/06/2011    Past Surgical  History:  Procedure Laterality Date  . BREAST CYST EXCISION  12/15/2011   Procedure: CYST EXCISION BREAST;  Surgeon: Shelly Rubenstein, MD;  Location: [facility];  Service: General;  Laterality: Left;  Excision of a Chronic Left Breast Sebaceous Cyst  . CARDIOVASCULAR STRESS TEST  12-28-2012   Low risk nuclear study w/ small LV cavity which may be due LVH/  no ischemia or infarct/  normal wall motion, ef 67%  . CYSTOSCOPY WITH STENT PLACEMENT Left 09/08/2015   Procedure: CYSTOSCOPY WITH LEFT STENT PLACEMENT;  Surgeon: Bjorn Pippin, MD;  Location: [facility];  Service: Urology;  Laterality: Left;  . CYSTOSCOPY WITH URETEROSCOPY AND STENT PLACEMENT Left 09/25/2015   Procedure: LEFT  URETEROSCOPY STONE EXTRACTION;  Surgeon: Bjorn Pippin, MD;  Location: [facility];  Service: Urology;  Laterality: Left;  . EXCISION AND REVISION SCAR ABDOMINAL WALL  01-22-2002  . EXPLORATORY LAPAROTOMY  1986   w/ Abdominal Myomectomy  (per pt bowel muscle cut (injury))  . HOLMIUM LASER APPLICATION Left 09/25/2015   Procedure: HOLMIUM LASER APPLICATION;  Surgeon: Bjorn Pippin, MD;  Location: [facility];  Service: Urology;  Laterality: Left;  . I & D LEFT BUTTOCK ABSCESS   07-01-2010   x2 area's  . NEGATIVE SLEEP STUDY  08-10-2005  . TRANSTHORACIC ECHOCARDIOGRAM  12-28-2012   grade 1 diastolic dysfunction, mild concentric  LVH, ef 65-70%/  mild MR/  trivial PR and TR     OB History   None      Home Medications    Prior to Admission medications   Medication Sig Start Date End Date Taking? Authorizing Provider  acitretin (SORIATANE) 25 MG capsule Take 25 mg by mouth every evening.     [provider]  adalimumab (HUMIRA) 40 MG/0.8ML injection Inject 40 mg into the skin every 14 (fourteen) days. Every other Wednesday, last injection 12/20/12    [provider]  albuterol (PROVENTIL HFA;VENTOLIN HFA) 108 (90 BASE) MCG/ACT inhaler Inhale 1-2 puffs into the lungs every 6 (six) hours  as needed for wheezing or shortness of breath.    [provider]  ALPRAZolam Prudy Feeler) 0.25 MG tablet Take 0.25 mg by mouth daily as needed.     [provider]  aspirin 81 MG tablet Take 81 mg by mouth daily.    [provider]  benzonatate (TESSALON) 100 MG capsule Take 1 capsule (100 mg total) by mouth 3 (three) times daily as needed for cough. 05/08/17   Loren Racer, MD  Coal Tar Extract 2 % OINT Apply 1 application topically daily.    [provider]  cycloSPORINE (RESTASIS) 0.05 % ophthalmic emulsion Place 1 drop into both eyes 2 (two) times daily.    [provider]  dicyclomine (BENTYL) 10 MG capsule Take 10 mg by mouth 2 (two) times daily.     [provider]  halobetasol (ULTRAVATE) 0.05 % cream Apply 1 application topically 2 (two) times daily as needed (for psoriasis).    [provider]  HYDROcodone-acetaminophen (NORCO/VICODIN) 5-325 MG tablet Take 1 tablet by mouth every 6 (six) hours as needed. 09/23/17   Lennette Bihari, MD  isosorbide mononitrate (IMDUR) 30 MG 24 hr tablet Take 15-30 mg by mouth daily as needed (only when needed). Takes 1/2 tab, next day 1/2 tab and on 3rd day takes 1 tablet and repeats as needed    [provider]  meclizine (ANTIVERT) 25 MG tablet Take 25 mg by mouth 3 (three) times daily.    [provider]  metoprolol tartrate (LOPRESSOR) 25 MG tablet Take 25 mg by mouth 2 (two) times daily.    [provider]  nitroGLYCERIN (NITROSTAT) 0.4 MG SL tablet Place 1 tablet (0.4 mg total) under the tongue every 5 (five) minutes x 3 doses as needed for chest pain. 12/29/12   [name], MD  Olopatadine HCl 0.2 % SOLN Apply 1 drop to eye every morning.    [provider]  oxyCODONE (ROXICODONE) 15 MG immediate release tablet Take 15 mg by mouth every 4 (four) hours as needed for pain.    [provider]  venlafaxine (EFFEXOR) 75 MG tablet Take 75 mg by  mouth every evening.    [provider]    Family History Family History  Problem Relation Age of Onset  . Cancer Mother        Stomach  . Cancer Father        Unknown    Social History Social History   Tobacco Use  . Smoking status: Current Every Day Smoker    Packs/day: 0.25    Years: 30.00    Pack years: 7.50    Types: Cigarettes  . Smokeless tobacco: Never Used  . Tobacco comment: " QUITTING ON HER OWN "  down to 5 cig daily  Substance Use Topics  . Alcohol use: No  .  Drug use: No     Allergies   Sulfa antibiotics   Review of Systems Review of Systems  Constitutional: Negative for chills and fever.  HENT: Negative for ear pain and sore throat.   Eyes: Negative for pain and visual disturbance.  Respiratory: Negative for cough and shortness of breath.   Cardiovascular: Negative for chest pain and palpitations.  Gastrointestinal: Positive for abdominal pain and nausea. Negative for vomiting.  Genitourinary: Positive for flank pain. Negative for dysuria and hematuria.  Musculoskeletal: Negative for arthralgias and back pain.  Skin: Negative for color change and rash.  Neurological: Negative for seizures and syncope.  All other systems reviewed and are negative.    Physical Exam Updated Vital Signs BP 112/75   Pulse 77   Temp 98.5 F (36.9 C) (Oral)   Resp 14   Ht  (1.575 m)   Wt 62.1 kg (137 lb)   SpO2 98%   BMI 25.06 kg/m   Physical Exam  Constitutional: She appears well-developed and well-nourished. No distress.  HENT:  Head: Normocephalic and atraumatic.  Eyes: Conjunctivae are normal.  Neck: Neck supple.  Cardiovascular: Normal rate and regular rhythm.  No murmur heard. Pulmonary/Chest: Effort normal and breath sounds normal. No respiratory distress.  Abdominal: Soft. Bowel sounds are normal. There is generalized tenderness.  Genitourinary:  Genitourinary Comments: +left CVA tenderness  Musculoskeletal: She exhibits no edema.   Neurological: She is alert.  Skin: Skin is warm and dry.  Psychiatric: She has a normal mood and affect.  Nursing note and vitals reviewed.    ED Treatments / Results  Labs (all labs ordered are listed, but only abnormal results are displayed) Labs Reviewed  COMPREHENSIVE METABOLIC PANEL - Abnormal; Notable for the following components:      Result Value   CO2 19 (*)    Glucose, Bld 170 (*)    Creatinine, Ser 2.95 (*)    Total Protein 8.2 (*)    GFR calc non Af Amer 16 (*)    GFR calc Af Amer 19 (*)    All other components within normal limits  URINALYSIS, ROUTINE W REFLEX MICROSCOPIC - Abnormal; Notable for the following components:   APPearance HAZY (*)    Hgb urine dipstick LARGE (*)    Protein, ur 30 (*)    Leukocytes, UA TRACE (*)    RBC / HPF >50 (*)    Bacteria, UA FEW (*)    All other components within normal limits  URINE CULTURE  LIPASE, BLOOD  CBC    EKG EKG Interpretation  Date/Time:  Friday Sep 23 2017 11:57:22 EDT Ventricular Rate:  104 PR Interval:  128 QRS Duration: 72 QT Interval:  344 QTC Calculation: 452 R Axis:   9 Text Interpretation:  Sinus tachycardia Right atrial enlargement Borderline ECG No acute changes Nonspecific ST and T wave abnormality Confirmed by Derwood Kaplan ([id number]) on 09/23/2017 5:10:11 PM   Radiology Ct Renal Stone Study  Result Date: 09/23/2017 CLINICAL DATA:  Left-sided flank pain.  History of kidney stones. EXAM: CT ABDOMEN AND PELVIS WITHOUT CONTRAST TECHNIQUE: Multidetector CT imaging of the abdomen and pelvis was performed following the standard protocol without IV contrast. COMPARISON:  CT abdomen pelvis dated Sep 08, 2015. FINDINGS: Lower chest: No acute abnormality. Stable benign 3 mm pleural based nodule in the left lower lobe. Hepatobiliary: No focal liver abnormality is seen. No gallstones, gallbladder wall thickening, or biliary dilatation. Pancreas: Unremarkable. No pancreatic ductal dilatation or surrounding  inflammatory changes.  Spleen: Normal in size without focal abnormality. Adrenals/Urinary Tract: The adrenal glands are unremarkable. There is a 5 mm calculus in the distal left ureter with resultant mild left hydroureteronephrosis. Additional nonobstructive calculi in the left kidney measuring up to 8 mm. Unchanged bilateral renal cysts. The right renal cyst contains a punctate calcification along its lateral wall. The bladder is unremarkable. Stomach/Bowel: Stomach is within normal limits. Appendix is surgically absent. No evidence of bowel wall thickening, distention, or inflammatory changes. Vascular/Lymphatic: Aortic atherosclerosis. No enlarged abdominal or pelvic lymph nodes. Reproductive: Status post hysterectomy. No adnexal masses. Other: No free fluid or pneumoperitoneum. Musculoskeletal: No acute or significant osseous findings. IMPRESSION: 1. 5 mm calculus in the distal left ureter with resultant mild left hydroureteronephrosis. 2. Additional nonobstructive left renal calculi. 3.  Aortic atherosclerosis (ICD10-I70.0). Electronically Signed   By: Obie Dredge M.D.   On: 09/23/2017 17:42    Procedures Procedures (including critical care time)  Medications Ordered in ED Medications  morphine 4 MG/ML injection 4 mg (4 mg Intravenous Given 09/23/17 1704)  ondansetron (ZOFRAN) injection 4 mg (4 mg Intravenous Given 09/23/17 1704)  ketorolac (TORADOL) 15 MG/ML injection 15 mg (15 mg Intravenous Given 09/23/17 1855)     Initial Impression / Assessment and Plan / ED Course  I have reviewed the triage vital signs and the nursing notes.  Pertinent labs & imaging results that were available during my care of the patient were reviewed by me and considered in my medical decision making (see chart for details).    Patient is a 62 year old female with history as above, notable for prior kidney stone, who presents with left-sided abdominal pain for the last 2 days.  She reports the pain is very similar  to her prior kidney stone.  She denies any fever or dysuria.  On exam, she does have diffuse abdominal tenderness, worse on the left side.  She also has a left CVA tenderness.  Her urinalysis is notable for blood, trace leuk esterase, and trace bacteria.  White blood cell count is normal.  Her creatinine is elevated at 2.95.  She does have a history of CKD and her creatinine today is near her previous range.  CT scan was obtained which shows a left-sided distal 5 mm ureteral calculi with mild left-sided hydronephrosis.  Her pain was controlled here with morphine and a small dose of Toradol.  I have ordered a urine culture.  I will not treat her with antibiotics based on her urinalysis, however follow-up urine culture will be necessary.  The patient has previously seen a urologist when she had her last kidney stone.  I will have her follow-up with urology.  The patient states that she has never seen a nephrologist regarding her CKD.  I will also place follow-up instructions for nephrology.  Return precautions were discussed in detail.  Patient was discharged in stable condition.  Final Clinical Impressions(s) / ED Diagnoses   Final diagnoses:  Left ureteral stone  Elevated serum creatinine    ED Discharge Orders        Ordered    HYDROcodone-acetaminophen (NORCO/VICODIN) 5-325 MG tablet  Every 6 hours PRN     09/23/17 1924       Lennette Bihari, MD 09/23/17 1943    Derwood Kaplan, MD 09/24/17 [id number]

## 2017-09-23 NOTE — ED Notes (Signed)
Attempted PIV x 2; no success. Second RN to attempt. 

## 2017-09-24 LAB — URINE CULTURE

## 2017-09-27 ENCOUNTER — Telehealth: Payer: Self-pay | Admitting: *Deleted

## 2017-09-27 NOTE — Telephone Encounter (Signed)
Pt called stating she is unable to get through on Dr MacDiarmid's office by pressing 2 for appointments.  EDCM advised to press another number to get a live person.

## 2018-04-11 ENCOUNTER — Emergency Department (HOSPITAL_COMMUNITY)
Admission: EM | Admit: 2018-04-11 | Discharge: 2018-04-11 | Disposition: A | Discharge status: Home / Self Care | Payer: Medicare Other | Attending: Emergency Medicine | Admitting: Emergency Medicine

## 2018-04-11 ENCOUNTER — Emergency Department (HOSPITAL_COMMUNITY): Payer: Medicare Other

## 2018-04-11 ENCOUNTER — Encounter (HOSPITAL_COMMUNITY): Payer: Self-pay | Admitting: Internal Medicine

## 2018-04-11 DIAGNOSIS — N183 Chronic kidney disease, stage 3 (moderate): Secondary | ICD-10-CM | POA: Diagnosis not present

## 2018-04-11 DIAGNOSIS — Z79899 Other long term (current) drug therapy: Secondary | ICD-10-CM | POA: Insufficient documentation

## 2018-04-11 DIAGNOSIS — R42 Dizziness and giddiness: Secondary | ICD-10-CM | POA: Diagnosis present

## 2018-04-11 DIAGNOSIS — F17210 Nicotine dependence, cigarettes, uncomplicated: Secondary | ICD-10-CM | POA: Diagnosis not present

## 2018-04-11 LAB — CBC WITH DIFFERENTIAL/PLATELET
Abs Immature Granulocytes: 0.02 10*3/uL (ref 0.00–0.07)
Basophils Absolute: 0 10*3/uL (ref 0.0–0.1)
Basophils Relative: 1 %
Eosinophils Absolute: 0.1 10*3/uL (ref 0.0–0.5)
Eosinophils Relative: 1 %
HCT: 43.7 % (ref 36.0–46.0)
Hemoglobin: 13.5 g/dL (ref 12.0–15.0)
Immature Granulocytes: 0 %
Lymphocytes Relative: 30 %
Lymphs Abs: 2.6 10*3/uL (ref 0.7–4.0)
MCH: 28.2 pg (ref 26.0–34.0)
MCHC: 30.9 g/dL (ref 30.0–36.0)
MCV: 91.4 fL (ref 80.0–100.0)
Monocytes Absolute: 0.5 10*3/uL (ref 0.1–1.0)
Monocytes Relative: 6 %
Neutro Abs: 5.5 10*3/uL (ref 1.7–7.7)
Neutrophils Relative %: 62 %
Platelets: 302 10*3/uL (ref 150–400)
RBC: 4.78 MIL/uL (ref 3.87–5.11)
RDW: 12.4 % (ref 11.5–15.5)
WBC: 8.7 10*3/uL (ref 4.0–10.5)
nRBC: 0 % (ref 0.0–0.2)

## 2018-04-11 LAB — BASIC METABOLIC PANEL
Anion gap: 11 (ref 5–15)
BUN: 11 mg/dL (ref 8–23)
CO2: 19 mmol/L — ABNORMAL LOW (ref 22–32)
Calcium: 9.3 mg/dL (ref 8.9–10.3)
Chloride: 113 mmol/L — ABNORMAL HIGH (ref 98–111)
Creatinine, Ser: 1.89 mg/dL — ABNORMAL HIGH (ref 0.44–1.00)
GFR calc Af Amer: 32 mL/min — ABNORMAL LOW (ref >60)
GFR calc non Af Amer: 28 mL/min — ABNORMAL LOW (ref >60)
Glucose, Bld: 113 mg/dL — ABNORMAL HIGH (ref 70–99)
Potassium: 3.6 mmol/L (ref 3.5–5.1)
Sodium: 143 mmol/L (ref 135–145)

## 2018-04-11 LAB — I-STAT TROPONIN, ED: Troponin i, poc: 0 ng/mL (ref 0.00–0.08)

## 2018-04-11 NOTE — ED Provider Notes (Signed)
[name] EMERGENCY DEPARTMENT Provider Note   CSN: [id number] Arrival date & time: 04/11/18  4098     History   Chief Complaint Chief Complaint  Patient presents with  . Loss of Consciousness  . Fall    HPI Whitney Parrish is a 62 y.o. female.  Patient with history of chronic kidney disease, arthritis, psoriasis, ambulates with a walker who presents the ED after episode of dizziness, fall in which she hit her head.  Patient not on blood thinners.  No chest pain, no shortness of breath.  Patient was not using her walker while walking today and felt dizzy and fell to the floor and hit her head.  No loss of consciousness.  Continues to have headache.  Patient has history of vertigo as well.  Fall happened just after she took her meclizine, oxycodone.  Patient is overall asymptomatic upon arrival except for mild headache.  The history is provided by the patient.  Fall  This is a new problem. The current episode started 1 to 2 hours ago. The problem has been resolved. Associated symptoms include headaches. Pertinent negatives include no chest pain, no abdominal pain and no shortness of breath. Nothing aggravates the symptoms. Nothing relieves the symptoms. She has tried nothing for the symptoms. The treatment provided no relief.    Past Medical History:  Diagnosis Date  . Anxiety   . Arthritis    shoulders and hands  . Benign positional vertigo   . Chronically dry eyes   . CKD (chronic kidney disease), stage III (HCC)   . Depression   . Dyspnea on exertion   . Hematuria   . History of acute pyelonephritis   . History of Bell's palsy    1980's left side-- residual lip numbness intermittant  . History of chest pain    non-cardiac per dr Algie Coffer notes (pt's pcp)  . History of panic attacks   . Left ureteral stone   . Loose bowel movements    chronic-per pt residual from injury during abdominal surgery in 1986  . Migraines   . Nephrolithiasis    left   .  Psoriasis   . Urgency of urination   . Wears dentures    has upper and lower but only wears upper    Patient Active Problem List   Diagnosis Date Noted  . Acute pyonephrosis 09/09/2015  . Chronic renal insufficiency 09/09/2015  . Acute renal insufficiency 09/09/2015  . Obstructive uropathy 09/08/2015  . Left nephrolithiasis 09/08/2015  . Chest pain at rest 11/15/2011  . Dizziness 11/15/2011  . Breast abscess 10/06/2011    Past Surgical History:  Procedure Laterality Date  . BREAST CYST EXCISION  12/15/2011   Procedure: CYST EXCISION BREAST;  Surgeon: Shelly Rubenstein, MD;  Location: [facility];  Service: General;  Laterality: Left;  Excision of a Chronic Left Breast Sebaceous Cyst  . CARDIOVASCULAR STRESS TEST  12-28-2012   Low risk nuclear study w/ small LV cavity which may be due LVH/  no ischemia or infarct/  normal wall motion, ef 67%  . CYSTOSCOPY WITH STENT PLACEMENT Left 09/08/2015   Procedure: CYSTOSCOPY WITH LEFT STENT PLACEMENT;  Surgeon: Bjorn Pippin, MD;  Location: [facility];  Service: Urology;  Laterality: Left;  . CYSTOSCOPY WITH URETEROSCOPY AND STENT PLACEMENT Left 09/25/2015   Procedure: LEFT  URETEROSCOPY STONE EXTRACTION;  Surgeon: Bjorn Pippin, MD;  Location: [facility];  Service: Urology;  Laterality: Left;  . EXCISION AND REVISION SCAR  ABDOMINAL WALL  01-22-2002  . EXPLORATORY LAPAROTOMY  1986   w/ Abdominal Myomectomy  (per pt bowel muscle cut (injury))  . HOLMIUM LASER APPLICATION Left 09/25/2015   Procedure: HOLMIUM LASER APPLICATION;  Surgeon: Bjorn PippinJohn Wrenn, MD;  [facility];  Service: Urology;  Laterality: Left;  . I & D LEFT BUTTOCK ABSCESS   07-01-2010   x2 area's  . NEGATIVE SLEEP STUDY  08-10-2005  . TRANSTHORACIC ECHOCARDIOGRAM  12-28-2012   grade 1 diastolic dysfunction, mild concentric LVH, ef 65-70%/  mild MR/  trivial PR and TR     OB History   None      Home Medications    Prior to Admission medications     Medication Sig Start Date End Date Taking? Authorizing Provider  ALPRAZolam Prudy Feeler(XANAX) 0.25 MG tablet Take 0.25 mg by mouth daily as needed for anxiety.    Yes [provider]  betamethasone valerate ointment (VALISONE) 0.1 % Apply 1 application topically 2 (two) times daily. 03/15/18  Yes [provider]  dicyclomine (BENTYL) 10 MG capsule Take 10 mg by mouth 2 (two) times daily.    Yes [provider]  meclizine (ANTIVERT) 25 MG tablet Take 25 mg by mouth 3 (three) times daily.   Yes [provider]  nitroGLYCERIN (NITROSTAT) 0.4 MG SL tablet Place 1 tablet (0.4 mg total) under the tongue every 5 (five) minutes x 3 doses as needed for chest pain. 12/29/12  Yes Orpah CobbKadakia, Ajay, MD  Olopatadine HCl (PAZEO) 0.7 % SOLN Place 1 drop into both eyes every evening.   Yes [provider]  Olopatadine HCl 0.2 % SOLN Apply 1 drop to eye every morning.   Yes [provider]  TALTZ 80 MG/ML SOAJ every 14 (fourteen) days. 03/09/18  Yes [provider]  TREMFYA 100 MG/ML SOPN every 14 (fourteen) days. 04/04/18  Yes [provider]    Family History Family History  Problem Relation Age of Onset  . Cancer Mother        Stomach  . Cancer Father        Unknown    Social History Social History   Tobacco Use  . Smoking status: Current Every Day Smoker    Packs/day: 0.25    Years: 30.00    Pack years: 7.50    Types: Cigarettes  . Smokeless tobacco: Never Used  . Tobacco comment: " QUITTING ON HER OWN "  down to 5 cig daily  Substance Use Topics  . Alcohol use: No  . Drug use: No     Allergies   Sulfa antibiotics   Review of Systems Review of Systems  Constitutional: Negative for chills and fever.  HENT: Negative for ear pain and sore throat.   Eyes: Negative for pain and visual disturbance.  Respiratory: Negative for cough and shortness of breath.   Cardiovascular: Negative for chest pain and palpitations.   Gastrointestinal: Negative for abdominal pain and vomiting.  Genitourinary: Negative for dysuria and hematuria.  Musculoskeletal: Negative for arthralgias and back pain.  Skin: Negative for color change and rash.  Neurological: Positive for dizziness and headaches. Negative for seizures and syncope.  All other systems reviewed and are negative.    Physical Exam Updated Vital Signs  ED Triage Vitals [04/11/18 0900]  Enc Vitals Group     BP (!) 125/99     Pulse Rate (!) 110     Resp 16     Temp  Temp src      SpO2 98 %     Weight      Height      Head Circumference      Peak Flow      Pain Score      Pain Loc      Pain Edu?      Excl. in GC?     Physical Exam  Constitutional: She is oriented to person, place, and time. She appears well-developed and well-nourished. No distress.  HENT:  Head: Normocephalic and atraumatic.  Mouth/Throat: No oropharyngeal exudate.  Eyes: Pupils are equal, round, and reactive to light. Conjunctivae and EOM are normal.  Neck: Normal range of motion. Neck supple.  Cardiovascular: Normal rate, regular rhythm, normal heart sounds and intact distal pulses.  No murmur heard. Pulmonary/Chest: Effort normal and breath sounds normal. No respiratory distress.  Abdominal: Soft. Bowel sounds are normal. She exhibits no distension. There is no tenderness.  Musculoskeletal: Normal range of motion. She exhibits no edema or tenderness.  No midline spine tenderness  Neurological: She is alert and oriented to person, place, and time. No cranial nerve deficit or sensory deficit. She exhibits normal muscle tone. Coordination normal.  Normal gait, 5+/5 strength, normal sensation, no drift, normal finger to nose finger  Skin: Skin is warm and dry. Capillary refill takes less than 2 seconds.  Psychiatric: Her mood appears anxious.  Nursing note and vitals reviewed.    ED Treatments / Results  Labs (all labs ordered are listed, but only abnormal results  are displayed) Labs Reviewed  BASIC METABOLIC PANEL - Abnormal; Notable for the following components:      Result Value   Chloride 113 (*)    CO2 19 (*)    Glucose, Bld 113 (*)    Creatinine, Ser 1.89 (*)    GFR calc non Af Amer 28 (*)    GFR calc Af Amer 32 (*)    All other components within normal limits  CBC WITH DIFFERENTIAL/PLATELET  I-STAT TROPONIN, ED    EKG EKG Interpretation  Date/Time:  Tuesday April 11 2018 09:04:16 EST Ventricular Rate:  100 PR Interval:    QRS Duration: 85 QT Interval:  372 QTC Calculation: 480 R Axis:   -10 Text Interpretation:  Sinus tachycardia Confirmed by Virgina Norfolk [id number]) on 04/11/2018 9:07:52 AM   Radiology Ct Head Wo Contrast  Result Date: 04/11/2018 CLINICAL DATA:  Minor head trauma EXAM: CT HEAD WITHOUT CONTRAST TECHNIQUE: Contiguous axial images were obtained from the base of the skull through the vertex without intravenous contrast. COMPARISON:  MR brain 11/16/2011 and CT brain of 11/15/2006 FINDINGS: Brain: The ventricular system is stable in size and configuration and the septum remains midline in position. The fourth ventricle and basilar cisterns are unremarkable. No hemorrhage, mass lesion, or acute infarction is seen. Only mild small vessel ischemic changes present within the periventricular white matter. Vascular: No vascular abnormality is evident on this unenhanced study. Skull: On bone window images, no calvarial abnormality is seen. Sinuses/Orbits: The paranasal sinuses appear pneumatized. Other: None. IMPRESSION: Minimal small vessel ischemic change in the periventricular white matter. No acute intracranial abnormality. Electronically Signed   By: Dwyane Dee M.D.   On: 04/11/2018 09:24    Procedures Procedures (including critical care time)  Medications Ordered in ED Medications - No data to display   Initial Impression / Assessment and Plan / ED Course  I have reviewed the triage vital signs and the nursing  notes.  Pertinent labs & imaging results that were available during my care of the patient were reviewed by me and considered in my medical decision making (see chart for details).     Whitney Parrish is a 62 year old female with history of depression, anxiety, CKD who presents to the ED with dizziness.  Patient with unremarkable vitals.  No fever.  Patient had episode of room spinning, vertigo type symptoms and fell to the floor hit her head.  She had no loss consciousness.  She has no symptoms upon my evaluation.  No chest pain, no shortness of breath.  Patient with normal neurological exam.  History and physical consistent with likely peripheral vertigo.  Normal neurological exam and no symptoms currently and doubt central process.  CT of the head showed no acute findings.  No bleeding.  Patient with no significant laboratory findings.  No significant anemia, electrolyte abnormality.  Creatinine at baseline.  EKG showed sinus rhythm no signs of ischemic changes.  Troponin within normal limits.  Doubt cardiac process.  No concern for arrhythmia.  Patient asymptomatic throughout my care and was given reassurance and discharged in ED good condition.  Patient already takes meclizine likely peripheral vertigo.  Told that she should continue to ambulate with her walker at all times.  Recommend follow-up with primary care doctor and discharged from ED in good condition.  This chart was dictated using voice recognition software.  Despite best efforts to proofread,  errors can occur which can change the documentation meaning.   Final Clinical Impressions(s) / ED Diagnoses   Final diagnoses:  Dizziness    ED Discharge Orders    None       Virgina Norfolk, DO 04/11/18 1119

## 2018-04-11 NOTE — ED Triage Notes (Addendum)
Pt was walking through her living room when she became dizzy, the room was spinning, and she fell to the ground and hit her head. Denies dizziness at this time. Denies LOC. Not on blood thinners. Denies neck/back pain.

## 2018-04-11 NOTE — ED Notes (Signed)
Patient verbalizes understanding of discharge instructions. Opportunity for questioning and answers were provided. 

## 2018-04-11 NOTE — ED Notes (Signed)
Got patient undress on the monitor did ekg shown to Dr Lockie Molauratolo patient is resting with call ell in reach

## 2018-04-16 ENCOUNTER — Emergency Department (HOSPITAL_COMMUNITY)
Admission: EM | Admit: 2018-04-16 | Discharge: 2018-04-16 | Disposition: A | Discharge status: Home / Self Care | Payer: Medicare Other | Attending: Emergency Medicine | Admitting: Emergency Medicine

## 2018-04-16 ENCOUNTER — Emergency Department (HOSPITAL_COMMUNITY): Payer: Medicare Other

## 2018-04-16 ENCOUNTER — Encounter (HOSPITAL_COMMUNITY): Payer: Self-pay

## 2018-04-16 ENCOUNTER — Other Ambulatory Visit: Payer: Self-pay

## 2018-04-16 DIAGNOSIS — N183 Chronic kidney disease, stage 3 (moderate): Secondary | ICD-10-CM | POA: Insufficient documentation

## 2018-04-16 DIAGNOSIS — B379 Candidiasis, unspecified: Secondary | ICD-10-CM

## 2018-04-16 DIAGNOSIS — N200 Calculus of kidney: Secondary | ICD-10-CM | POA: Diagnosis not present

## 2018-04-16 DIAGNOSIS — Z79899 Other long term (current) drug therapy: Secondary | ICD-10-CM | POA: Insufficient documentation

## 2018-04-16 DIAGNOSIS — F17210 Nicotine dependence, cigarettes, uncomplicated: Secondary | ICD-10-CM | POA: Diagnosis not present

## 2018-04-16 DIAGNOSIS — B373 Candidiasis of vulva and vagina: Secondary | ICD-10-CM | POA: Diagnosis not present

## 2018-04-16 DIAGNOSIS — R109 Unspecified abdominal pain: Secondary | ICD-10-CM | POA: Diagnosis present

## 2018-04-16 LAB — CBC WITH DIFFERENTIAL/PLATELET
Abs Immature Granulocytes: 0.03 10*3/uL (ref 0.00–0.07)
Basophils Absolute: 0 10*3/uL (ref 0.0–0.1)
Basophils Relative: 1 %
Eosinophils Absolute: 0.1 10*3/uL (ref 0.0–0.5)
Eosinophils Relative: 2 %
HCT: 44.7 % (ref 36.0–46.0)
Hemoglobin: 14.2 g/dL (ref 12.0–15.0)
Immature Granulocytes: 0 %
Lymphocytes Relative: 41 %
Lymphs Abs: 3.4 10*3/uL (ref 0.7–4.0)
MCH: 28.5 pg (ref 26.0–34.0)
MCHC: 31.8 g/dL (ref 30.0–36.0)
MCV: 89.8 fL (ref 80.0–100.0)
Monocytes Absolute: 0.5 10*3/uL (ref 0.1–1.0)
Monocytes Relative: 6 %
Neutro Abs: 4.3 10*3/uL (ref 1.7–7.7)
Neutrophils Relative %: 50 %
Platelets: 307 10*3/uL (ref 150–400)
RBC: 4.98 MIL/uL (ref 3.87–5.11)
RDW: 12.3 % (ref 11.5–15.5)
WBC: 8.4 10*3/uL (ref 4.0–10.5)
nRBC: 0 % (ref 0.0–0.2)

## 2018-04-16 LAB — COMPREHENSIVE METABOLIC PANEL
ALT: 20 U/L (ref 0–44)
AST: 24 U/L (ref 15–41)
Albumin: 4 g/dL (ref 3.5–5.0)
Alkaline Phosphatase: 83 U/L (ref 38–126)
Anion gap: 8 (ref 5–15)
BUN: 9 mg/dL (ref 8–23)
CO2: 22 mmol/L (ref 22–32)
Calcium: 9.3 mg/dL (ref 8.9–10.3)
Chloride: 111 mmol/L (ref 98–111)
Creatinine, Ser: 2.03 mg/dL — ABNORMAL HIGH (ref 0.44–1.00)
GFR calc Af Amer: 29 mL/min — ABNORMAL LOW (ref >60)
GFR calc non Af Amer: 25 mL/min — ABNORMAL LOW (ref >60)
Glucose, Bld: 141 mg/dL — ABNORMAL HIGH (ref 70–99)
Potassium: 3.1 mmol/L — ABNORMAL LOW (ref 3.5–5.1)
Sodium: 141 mmol/L (ref 135–145)
Total Bilirubin: 0.6 mg/dL (ref 0.3–1.2)
Total Protein: 7.7 g/dL (ref 6.5–8.1)

## 2018-04-16 LAB — URINALYSIS, ROUTINE W REFLEX MICROSCOPIC
Bilirubin Urine: NEGATIVE
Glucose, UA: NEGATIVE mg/dL
Ketones, ur: NEGATIVE mg/dL
Nitrite: NEGATIVE
Protein, ur: 30 mg/dL — AB
Specific Gravity, Urine: 1.015 (ref 1.005–1.030)
pH: 6 (ref 5.0–8.0)

## 2018-04-16 LAB — WET PREP, GENITAL
Clue Cells Wet Prep HPF POC: NONE SEEN
Sperm: NONE SEEN
Trich, Wet Prep: NONE SEEN

## 2018-04-16 LAB — LIPASE, BLOOD: Lipase: 61 U/L — ABNORMAL HIGH (ref 11–51)

## 2018-04-16 MED ORDER — HYDROCODONE-ACETAMINOPHEN 5-325 MG PO TABS: 1.0000 | ORAL_TABLET | ORAL | 0 refills | Status: AC | PRN

## 2018-04-16 MED ORDER — SODIUM CHLORIDE 0.9 % IV BOLUS
500.0000 mL | Freq: Once | INTRAVENOUS | Status: AC
  Administered 2018-04-16: 500 mL via INTRAVENOUS

## 2018-04-16 MED ORDER — MORPHINE SULFATE (PF) 4 MG/ML IV SOLN
4.0000 mg | Freq: Once | INTRAVENOUS | Status: AC
  Administered 2018-04-16: 4 mg via INTRAVENOUS
  Filled 2018-04-16: qty 1

## 2018-04-16 MED ORDER — FLUCONAZOLE 150 MG PO TABS: 150.0000 mg | ORAL_TABLET | Freq: Once | ORAL | 1 refills | Status: AC

## 2018-04-16 NOTE — ED Notes (Signed)
Patient transported to CT 

## 2018-04-16 NOTE — ED Notes (Signed)
Patient verbalizes understanding of discharge instructions. Opportunity for questioning and answers were provided. Armband removed by staff, pt discharged from ED.  

## 2018-04-16 NOTE — ED Notes (Signed)
Known kidney stones, pt drove herself to the ED

## 2018-04-16 NOTE — ED Triage Notes (Signed)
Pt presents for flank pain since Friday- states she thinks she has another kidney stone, endorses history of the same. Pt also endorses possible STD exposure, pt denies symptoms, states she recently had a new sexual partner and just wants to "be on the safe side". Pt denies burning with urination or frequency.

## 2018-04-16 NOTE — Discharge Instructions (Addendum)
Prescribed medication to help with your yeast infection, please take this repeat in 3 days if the symptoms continue.  I have also prescribed medication to help with your pain.  Please follow-up with Dr. Chauncy Leanen this week for your kidney stone.  If you experience any fever, worsening symptoms you may return to the ED for reevaluation.

## 2018-04-16 NOTE — ED Provider Notes (Addendum)
[name] EMERGENCY DEPARTMENT Provider Note   CSN: [id number] Arrival date & time: 04/16/18  1643     History   Chief Complaint Chief Complaint  Patient presents with  . Flank Pain    HPI Whitney Parrish is a 62 y.o. female.  62 y.o female with a PMH of CKD, Arthritis presents to the ED with a chief complaint of left flank pain x 1 week. Patient reports pain on the left flank which she describes as "grabbing pressure" that radiates to the front of her abdomen. She reports the pain is worse with lying down. She has not tried any medical therapy for relive in symptoms. She reports feeling this in the past when she had a kidney stone 6 months ago. She also reports recently being sexually active after 6 years and would like to have STI testing done. She denies any fever but reports feeling warm, no chest pain, shortness of breath or other symptoms.      Past Medical History:  Diagnosis Date  . Anxiety   . Arthritis    shoulders and hands  . Benign positional vertigo   . Chronically dry eyes   . CKD (chronic kidney disease), stage III (HCC)   . Depression   . Dyspnea on exertion   . Hematuria   . History of acute pyelonephritis   . History of Bell's palsy    1980's left side-- residual lip numbness intermittant  . History of chest pain    non-cardiac per dr Algie Coffer notes (pt's pcp)  . History of panic attacks   . Left ureteral stone   . Loose bowel movements    chronic-per pt residual from injury during abdominal surgery in 1986  . Migraines   . Nephrolithiasis    left   . Psoriasis   . Urgency of urination   . Wears dentures    has upper and lower but only wears upper    Patient Active Problem List   Diagnosis Date Noted  . Acute pyonephrosis 09/09/2015  . Chronic renal insufficiency 09/09/2015  . Acute renal insufficiency 09/09/2015  . Obstructive uropathy 09/08/2015  . Left nephrolithiasis 09/08/2015  . Chest pain at rest 11/15/2011  .  Dizziness 11/15/2011  . Breast abscess 10/06/2011    Past Surgical History:  Procedure Laterality Date  . BREAST CYST EXCISION  12/15/2011   Procedure: CYST EXCISION BREAST;  Surgeon: Shelly Rubenstein, MD;  Location: [facility];  Service: General;  Laterality: Left;  Excision of a Chronic Left Breast Sebaceous Cyst  . CARDIOVASCULAR STRESS TEST  12-28-2012   Low risk nuclear study w/ small LV cavity which may be due LVH/  no ischemia or infarct/  normal wall motion, ef 67%  . CYSTOSCOPY WITH STENT PLACEMENT Left 09/08/2015   Procedure: CYSTOSCOPY WITH LEFT STENT PLACEMENT;  Surgeon: Bjorn Pippin, MD;  Location: [facility];  Service: Urology;  Laterality: Left;  . CYSTOSCOPY WITH URETEROSCOPY AND STENT PLACEMENT Left 09/25/2015   Procedure: LEFT  URETEROSCOPY STONE EXTRACTION;  Surgeon: Bjorn Pippin, MD;  Location: [facility];  Service: Urology;  Laterality: Left;  . EXCISION AND REVISION SCAR ABDOMINAL WALL  01-22-2002  . EXPLORATORY LAPAROTOMY  1986   w/ Abdominal Myomectomy  (per pt bowel muscle cut (injury))  . HOLMIUM LASER APPLICATION Left 09/25/2015   Procedure: HOLMIUM LASER APPLICATION;  Surgeon: Bjorn Pippin, MD;  Location: [facility];  Service: Urology;  Laterality: Left;  . I & D  LEFT BUTTOCK ABSCESS   07-01-2010   x2 area's  . NEGATIVE SLEEP STUDY  08-10-2005  . TRANSTHORACIC ECHOCARDIOGRAM  12-28-2012   grade 1 diastolic dysfunction, mild concentric LVH, ef 65-70%/  mild MR/  trivial PR and TR     OB History   No obstetric history on file.      Home Medications    Prior to Admission medications   Medication Sig Start Date End Date Taking? Authorizing Provider  acetaminophen (TYLENOL) 500 MG tablet Take 1,000 mg by mouth every 6 (six) hours as needed for headache (pain).   Yes [provider]  albuterol (PROVENTIL HFA;VENTOLIN HFA) 108 (90 Base) MCG/ACT inhaler Inhale 2 puffs into the lungs 3 (three) times daily.   Yes [provider]    ALPRAZolam (XANAX) 0.25 MG tablet Take 0.25 mg by mouth at bedtime as needed for anxiety or sleep.    Yes [provider]  betamethasone valerate ointment (VALISONE) 0.1 % Apply 1 application topically 2 (two) times daily. For plaque psoriasis - apply to affected area and wrap with saran wrap 03/15/18  Yes [provider]  dicyclomine (BENTYL) 10 MG capsule Take 10 mg by mouth 2 (two) times daily.    Yes [provider]  Guselkumab (TREMFYA) 100 MG/ML SOPN Inject 100 mg into the skin every 30 (thirty) days.   Yes [provider]  meclizine (ANTIVERT) 25 MG tablet Take 25 mg by mouth 3 (three) times daily. For vertigo   Yes [provider]  nitroGLYCERIN (NITROSTAT) 0.4 MG SL tablet Place 1 tablet (0.4 mg total) under the tongue every 5 (five) minutes x 3 doses as needed for chest pain. 12/29/12  Yes Orpah CobbKadakia, Ajay, MD  Olopatadine HCl (PAZEO) 0.7 % SOLN Place 1 drop into both eyes at bedtime.    Yes [provider]  Olopatadine HCl 0.2 % SOLN Place 1 drop into both eyes daily.    Yes [provider]  fluconazole (DIFLUCAN) 150 MG tablet Take 1 tablet (150 mg total) by mouth once for 1 dose. 04/16/18 04/16/18  Claude MangesSoto, Kennedie Pardoe, PA-C  HYDROcodone-acetaminophen (NORCO/VICODIN) 5-325 MG tablet Take 1 tablet by mouth every 4 (four) hours as needed for up to 3 days. 04/16/18 04/19/18  Claude MangesSoto, Obert Espindola, PA-C    Family History Family History  Problem Relation Age of Onset  . Cancer Mother        Stomach  . Cancer Father        Unknown    Social History Social History   Tobacco Use  . Smoking status: Current Every Day Smoker    Packs/day: 0.25    Years: 30.00    Pack years: 7.50    Types: Cigarettes  . Smokeless tobacco: Never Used  . Tobacco comment: " QUITTING ON HER OWN "  down to 5 cig daily  Substance Use Topics  . Alcohol use: No  . Drug use: No     Allergies   Sulfa antibiotics   Review of Systems Review of Systems   Constitutional: Negative for fever.  HENT: Negative for sore throat.   Respiratory: Negative for shortness of breath.   Cardiovascular: Negative for chest pain.  Gastrointestinal: Positive for abdominal pain. Negative for diarrhea, nausea and vomiting.  Genitourinary: Positive for flank pain (Left). Negative for vaginal bleeding and vaginal discharge.  Musculoskeletal: Negative for back pain.  Skin: Negative for pallor and wound.  All other systems reviewed and are negative.    Physical Exam Updated Vital  Signs BP (!) 148/104   Pulse (!) 103   Temp 99.3 F (37.4 C) (Oral)   Resp 14   Ht 5\' 2"  (1.575 m)   Wt 60.8 kg   SpO2 98%   BMI 24.51 kg/m   Physical Exam Vitals signs and nursing note reviewed. Exam conducted with a chaperone present.  Constitutional:      Appearance: Normal appearance. She is ill-appearing.     Comments: Pacing the room holding left flank.  HENT:     Head: Normocephalic and atraumatic.     Mouth/Throat:     Mouth: Mucous membranes are moist.     Pharynx: Oropharynx is clear.  Eyes:     Pupils: Pupils are equal, round, and reactive to light.  Neck:     Musculoskeletal: Normal range of motion and neck supple.  Cardiovascular:     Rate and Rhythm: Normal rate.  Pulmonary:     Effort: Pulmonary effort is normal.     Breath sounds: Normal breath sounds. No wheezing.  Abdominal:     General: Abdomen is flat. Bowel sounds are normal. There is no distension.     Palpations: Abdomen is soft.     Tenderness: There is no abdominal tenderness. There is left CVA tenderness. There is no right CVA tenderness or guarding. Negative signs include Rovsing's sign and McBurney's sign.  Genitourinary:    Exam position: Supine.     Pubic Area: No rash or pubic lice.      Labia:        Right: No rash.        Left: No rash.      Comments: Significant amount of white thick discharge along cervix and in vaginal vault. No vesicles, rash noted. Chaperoned by Meyer Cory.   Skin:    General: Skin is warm and dry.     Findings: No erythema.  Neurological:     General: No focal deficit present.     Mental Status: She is alert and oriented to person, place, and time.      ED Treatments / Results  Labs (all labs ordered are listed, but only abnormal results are displayed) Labs Reviewed  WET PREP, GENITAL - Abnormal; Notable for the following components:      Result Value   Yeast Wet Prep HPF POC PRESENT (*)    WBC, Wet Prep HPF POC MANY (*)    All other components within normal limits  COMPREHENSIVE METABOLIC PANEL - Abnormal; Notable for the following components:   Potassium 3.1 (*)    Glucose, Bld 141 (*)    Creatinine, Ser 2.03 (*)    GFR calc non Af Amer 25 (*)    GFR calc Af Amer 29 (*)    All other components within normal limits  LIPASE, BLOOD - Abnormal; Notable for the following components:   Lipase 61 (*)    All other components within normal limits  URINALYSIS, ROUTINE W REFLEX MICROSCOPIC - Abnormal; Notable for the following components:   APPearance CLOUDY (*)    Hgb urine dipstick SMALL (*)    Protein, ur 30 (*)    Leukocytes, UA LARGE (*)    Bacteria, UA MANY (*)    All other components within normal limits  URINE CULTURE  CBC WITH DIFFERENTIAL/PLATELET  GC/CHLAMYDIA PROBE AMP ([place]) NOT AT [facility]    EKG None  Radiology Ct Renal Stone Study  Result Date: 04/16/2018 CLINICAL DATA:  Left flank pain, nausea EXAM: CT ABDOMEN AND PELVIS  WITHOUT CONTRAST TECHNIQUE: Multidetector CT imaging of the abdomen and pelvis was performed following the standard protocol without IV contrast. COMPARISON:  10/26/2017 FINDINGS: Lower chest: Lung bases are clear. No effusions. Heart is normal size. Hepatobiliary: No focal hepatic abnormality. Gallbladder unremarkable. Pancreas: No focal abnormality or ductal dilatation. Spleen: No focal abnormality.  Normal size. Adrenals/Urinary Tract: Multiple low-density lesions within the kidneys  bilaterally. Areas of cortical thinning and scarring. 7 mm stone in the lower pole of the left kidney. Punctate nonobstructing stone in the upper pole of the right kidney. No ureteral stones or hydronephrosis. Adrenal glands and urinary bladder unremarkable. Stomach/Bowel: Mild gaseous distention of the stomach and large bowel. Moderate stool burden throughout the colon. No evidence of bowel obstruction. Vascular/Lymphatic: Aortic atherosclerosis. No aneurysm or adenopathy. Reproductive: Prior hysterectomy.  No adnexal masses. Other: No free fluid or free air. Musculoskeletal: No acute bony abnormality. IMPRESSION: Bilateral nephrolithiasis, the largest in the lower pole of the left kidney measuring 7 mm. No ureteral stones or hydronephrosis. Aortic atherosclerosis. Mild gaseous distention of the stomach and large bowel could reflect mild ileus. Moderate stool burden in the colon. Aortic atherosclerosis. Electronically Signed   By: Charlett Nose M.D.   On: 04/16/2018 19:06    Procedures Procedures (including critical care time)  Medications Ordered in ED Medications  sodium chloride 0.9 % bolus 500 mL (0 mLs Intravenous Stopped 04/16/18 1858)  morphine 4 MG/ML injection 4 mg (4 mg Intravenous Given 04/16/18 1747)     Initial Impression / Assessment and Plan / ED Course  I have reviewed the triage vital signs and the nursing notes.  Pertinent labs & imaging results that were available during my care of the patient were reviewed by me and considered in my medical decision making (see chart for details).   Presenting with left flank pain which she reports feels exactly like the time she had kidney stones.  She is established patient with Dr. Annabell Howells.  CMP showed slight decrease in potassium, creatinine is slightly elevated at 2.06 from her previous visit, I abided patient with fluids to help with this.  CBC showed no leukocytosis, hemoglobin stable.  Prep was obtained which show white blood cell count  many, yeast present.  Patient recently had sexual intercourse after 6 years.  He does describe some irritation there but no urinary symptoms.  UA did show leukocytes large, many bacteria but was contaminated we will send this urine for culture. CT renal study showed: Bilateral nephrolithiasis, the largest in the lower pole of the left  kidney measuring 7 mm. No ureteral stones or hydronephrosis.    Aortic atherosclerosis.    Mild gaseous distention of the stomach and large bowel could reflect  mild ileus. Moderate stool burden in the colon.    Aortic atherosclerosis.     Will provide patient with pain medication along with follow-up with Dr. Annabell Howells, I will also give her a flucanazole to help with her yeast infection.  She is advised to follow-up with her PCP for reevaluation of symptoms in 1 week if they do not resolve.  Patient is currently pain-free after 4 mg of morphine.  Low Suspicion for any Pylo as patient denies any fever.   Final Clinical Impressions(s) / ED Diagnoses   Final diagnoses:  Nephrolithiasis  Yeast infection    ED Discharge Orders         Ordered    fluconazole (DIFLUCAN) 150 MG tablet   Once     04/16/18 1958  HYDROcodone-acetaminophen (NORCO/VICODIN) 5-325 MG tablet  Every 4 hours PRN     04/16/18 1958           Claude Manges, Cordelia Poche 04/16/18 2002    Long, Joshua G, MD 04/16/18 2017    Claude Manges, PA-C 04/16/18 2024    Maia Plan, MD 04/18/18 [id number]

## 2018-04-17 LAB — URINE CULTURE: Culture: <10000 — AB

## 2018-04-17 LAB — GC/CHLAMYDIA PROBE AMP (~~LOC~~) NOT AT ARMC
Chlamydia: NEGATIVE
Neisseria Gonorrhea: NEGATIVE

## 2018-05-01 ENCOUNTER — Other Ambulatory Visit: Payer: Self-pay | Admitting: Cardiovascular Disease

## 2018-05-01 ENCOUNTER — Ambulatory Visit
Admission: RE | Admit: 2018-05-01 | Discharge: 2018-05-01 | Disposition: A | Discharge status: Home / Self Care | Payer: Medicare Other | Source: Ambulatory Visit | Attending: Cardiovascular Disease | Admitting: Cardiovascular Disease

## 2018-05-01 DIAGNOSIS — R05 Cough: Secondary | ICD-10-CM

## 2018-05-01 DIAGNOSIS — R053 Chronic cough: Secondary | ICD-10-CM

## 2018-06-30 ENCOUNTER — Other Ambulatory Visit: Payer: Self-pay | Admitting: Cardiovascular Disease

## 2018-06-30 ENCOUNTER — Ambulatory Visit
Admission: RE | Admit: 2018-06-30 | Discharge: 2018-06-30 | Disposition: A | Discharge status: Home / Self Care | Payer: Medicare Other | Source: Ambulatory Visit | Attending: Cardiovascular Disease | Admitting: Cardiovascular Disease

## 2018-06-30 DIAGNOSIS — R062 Wheezing: Secondary | ICD-10-CM

## 2019-08-05 ENCOUNTER — Ambulatory Visit (HOSPITAL_COMMUNITY): Payer: Medicare Other

## 2019-08-05 ENCOUNTER — Ambulatory Visit (HOSPITAL_COMMUNITY)
Admission: EM | Admit: 2019-08-05 | Discharge: 2019-08-05 | Disposition: A | Discharge status: Home / Self Care | Payer: Medicare Other

## 2019-08-05 ENCOUNTER — Other Ambulatory Visit: Payer: Self-pay

## 2019-08-05 ENCOUNTER — Ambulatory Visit (INDEPENDENT_AMBULATORY_CARE_PROVIDER_SITE_OTHER): Payer: Medicare Other

## 2019-08-05 DIAGNOSIS — M25511 Pain in right shoulder: Secondary | ICD-10-CM

## 2019-08-05 MED ORDER — CYCLOBENZAPRINE HCL 10 MG PO TABS: 10.0000 mg | ORAL_TABLET | Freq: Two times a day (BID) | ORAL | 0 refills | Status: DC | PRN

## 2019-08-05 MED ORDER — IBUPROFEN 600 MG PO TABS: 600.0000 mg | ORAL_TABLET | Freq: Four times a day (QID) | ORAL | 0 refills | Status: DC | PRN
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
## 2019-08-05 NOTE — Discharge Instructions (Addendum)
Take the ibuprofen as prescribed.  Rest your arm. Apply ice packs 2-3 times a day for up to 20 minutes each.    Follow up with your primary care provider or an orthopedist if you symptoms continue or worsen;  Or if you develop new symptoms, such as numbness, tingling, or weakness.
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
## 2019-08-09 ENCOUNTER — Other Ambulatory Visit: Payer: Self-pay | Admitting: Orthopedic Surgery

## 2019-08-09 DIAGNOSIS — M25511 Pain in right shoulder: Secondary | ICD-10-CM

## 2019-08-18 ENCOUNTER — Other Ambulatory Visit: Payer: Self-pay

## 2019-08-18 ENCOUNTER — Encounter (HOSPITAL_COMMUNITY): Payer: Self-pay | Admitting: Emergency Medicine

## 2019-08-18 ENCOUNTER — Emergency Department (HOSPITAL_COMMUNITY)
Admission: EM | Admit: 2019-08-18 | Discharge: 2019-08-19 | Disposition: A | Discharge status: Home / Self Care | Payer: Medicare Other | Attending: Emergency Medicine | Admitting: Emergency Medicine

## 2019-08-18 DIAGNOSIS — F17210 Nicotine dependence, cigarettes, uncomplicated: Secondary | ICD-10-CM | POA: Insufficient documentation

## 2019-08-18 DIAGNOSIS — I129 Hypertensive chronic kidney disease with stage 1 through stage 4 chronic kidney disease, or unspecified chronic kidney disease: Secondary | ICD-10-CM | POA: Insufficient documentation

## 2019-08-18 DIAGNOSIS — F419 Anxiety disorder, unspecified: Secondary | ICD-10-CM | POA: Insufficient documentation

## 2019-08-18 DIAGNOSIS — Z79899 Other long term (current) drug therapy: Secondary | ICD-10-CM | POA: Insufficient documentation

## 2019-08-18 DIAGNOSIS — N183 Chronic kidney disease, stage 3 unspecified: Secondary | ICD-10-CM | POA: Insufficient documentation

## 2019-08-18 NOTE — ED Triage Notes (Signed)
BIB EMS from home. Presents with multiple complaints. States she got covid vaccine yesterday and has had anxiety ever since. Also reports arm pain. Appears highly anxious in triage.

## 2019-08-19 DIAGNOSIS — F419 Anxiety disorder, unspecified: Secondary | ICD-10-CM | POA: Diagnosis not present

## 2019-08-19 NOTE — Discharge Instructions (Addendum)
Recommend increasing your alprazolam to 3 times daily for better control of your anxiety and panic.   Follow up with your doctor this week for recheck.

## 2019-08-19 NOTE — ED Notes (Signed)
Patient verbalizes understanding of discharge instructions. Opportunity for questioning and answers were provided. Armband removed by staff, pt discharged from ED ambulatory.   

## 2019-08-19 NOTE — ED Provider Notes (Signed)
[facility] EMERGENCY DEPARTMENT Provider Note   CSN: [id number] Arrival date & time: 08/18/19  2004     History Chief Complaint  Patient presents with  . Anxiety    Whitney Parrish is a 64 y.o. female.  Patient with history of anxiety/panic, CKD, psoriasis, kidney stones, migraines presents with increased frequency of panic attacks in the last 3 days. She describes sudden onset chest tightness, heart racing, SOB, perioral tingling and room-spinning dizziness that lasts about 15 minutes. She reports having them throughout the day over this 3 day period. She received her first COVID vaccine 4 days ago and is having significant arm soreness that she feels scared her and this may be the reason for worsening anxiety. No chest pain, just tightness. No fever, cough, body aches, vomiting. She takes Xanax 0.25 mg twice daily and Meclizine infrequently because "she doesn't like to mix medications". Her Xanax is prescribed as up to TID.   The history is provided by the patient.       Past Medical History:  Diagnosis Date  . Anxiety   . Arthritis    shoulders and hands  . Benign positional vertigo   . Chronically dry eyes   . CKD (chronic kidney disease), stage III   . Depression   . Dyspnea on exertion   . Hematuria   . History of acute pyelonephritis   . History of Bell's palsy    1980's left side-- residual lip numbness intermittant  . History of chest pain    non-cardiac per dr Doylene Canard notes (pt's pcp)  . History of panic attacks   . Left ureteral stone   . Loose bowel movements    chronic-per pt residual from injury during abdominal surgery in 1986  . Migraines   . Nephrolithiasis    left   . Psoriasis   . Urgency of urination   . Wears dentures    has upper and lower but only wears upper    Patient Active Problem List   Diagnosis Date Noted  . Acute pyonephrosis 09/09/2015  . Chronic renal insufficiency 09/09/2015  . Acute renal insufficiency  09/09/2015  . Obstructive uropathy 09/08/2015  . Left nephrolithiasis 09/08/2015  . Chest pain at rest 11/15/2011  . Dizziness 11/15/2011  . Breast abscess 10/06/2011    Past Surgical History:  Procedure Laterality Date  . BREAST CYST EXCISION  12/15/2011   Procedure: CYST EXCISION BREAST;  Surgeon: Harl Bowie, MD;  Location: [facility];  Service: General;  Laterality: Left;  Excision of a Chronic Left Breast Sebaceous Cyst  . CARDIOVASCULAR STRESS TEST  12-28-2012   Low risk nuclear study w/ small LV cavity which may be due LVH/  no ischemia or infarct/  normal wall motion, ef 67%  . CYSTOSCOPY WITH STENT PLACEMENT Left 09/08/2015   Procedure: CYSTOSCOPY WITH LEFT STENT PLACEMENT;  Surgeon: Irine Seal, MD;  Location: [facility];  Service: Urology;  Laterality: Left;  . CYSTOSCOPY WITH URETEROSCOPY AND STENT PLACEMENT Left 09/25/2015   Procedure: LEFT  URETEROSCOPY STONE EXTRACTION;  Surgeon: Irine Seal, MD;  Location: [facility];  Service: Urology;  Laterality: Left;  . EXCISION AND REVISION SCAR ABDOMINAL WALL  01-22-2002  . EXPLORATORY LAPAROTOMY  1986   w/ Abdominal Myomectomy  (per pt bowel muscle cut (injury))  . HOLMIUM LASER APPLICATION Left 5/62/1308   Procedure: HOLMIUM LASER APPLICATION;  Surgeon: Irine Seal, MD;  Location: [facility];  Service: Urology;  Laterality: Left;  .  I & D LEFT BUTTOCK ABSCESS   07-01-2010   x2 area's  . NEGATIVE SLEEP STUDY  08-10-2005  . TRANSTHORACIC ECHOCARDIOGRAM  12-28-2012   grade 1 diastolic dysfunction, mild concentric LVH, ef 65-70%/  mild MR/  trivial PR and TR     OB History   No obstetric history on file.     Family History  Problem Relation Age of Onset  . Cancer Mother        Stomach  . Cancer Father        Unknown    Social History   Tobacco Use  . Smoking status: Current Every Day Smoker    Packs/day: 0.25    Years: 30.00    Pack years: 7.50    Types: Cigarettes  . Smokeless tobacco:  Never Used  . Tobacco comment: " QUITTING ON HER OWN "  down to 5 cig daily  Substance Use Topics  . Alcohol use: No  . Drug use: No    Home Medications Prior to Admission medications   Medication Sig Start Date End Date Taking? Authorizing Provider  acetaminophen (TYLENOL) 500 MG tablet Take 1,000 mg by mouth every 6 (six) hours as needed for headache (pain).   Yes [provider]  albuterol (PROVENTIL HFA;VENTOLIN HFA) 108 (90 Base) MCG/ACT inhaler Inhale 2 puffs into the lungs 3 (three) times daily.   Yes [provider]  ALPRAZolam (XANAX) 0.25 MG tablet Take 0.25 mg by mouth at bedtime as needed for anxiety or sleep.    Yes [provider]  betamethasone valerate ointment (VALISONE) 0.1 % Apply 1 application topically 2 (two) times daily. For plaque psoriasis - apply to affected area and wrap with saran wrap 03/15/18  Yes [provider]  budesonide-formoterol (SYMBICORT) 160-4.5 MCG/ACT inhaler Inhale 2 puffs into the lungs 2 (two) times daily.   Yes [provider]  cyclobenzaprine (FLEXERIL) 10 MG tablet Take 1 tablet (10 mg total) by mouth 2 (two) times daily as needed for muscle spasms. 08/05/19  Yes Moshe Cipro, NP  cycloSPORINE (RESTASIS) 0.05 % ophthalmic emulsion Place 1 drop into both eyes daily.   Yes [provider]  dicyclomine (BENTYL) 10 MG capsule Take 10 mg by mouth 2 (two) times daily as needed for spasms.    Yes [provider]  gabapentin (NEURONTIN) 300 MG capsule Take 300 mg by mouth daily as needed (Nerve pain).  06/26/19  Yes [provider]  Guselkumab (TREMFYA) 100 MG/ML SOPN Inject 100 mg into the skin every 30 (thirty) days.   Yes [provider]  ibuprofen (ADVIL) 600 MG tablet Take 1 tablet (600 mg total) by mouth every 6 (six) hours as needed. 08/05/19  Yes Moshe Cipro, NP  meclizine (ANTIVERT) 25 MG tablet Take 25 mg by mouth 3 (three) times daily as needed for  dizziness (Vertigo).    Yes [provider]  nitroGLYCERIN (NITROSTAT) 0.4 MG SL tablet Place 1 tablet (0.4 mg total) under the tongue every 5 (five) minutes x 3 doses as needed for chest pain. 12/29/12  Yes [name], MD    Allergies    Sulfa antibiotics  Review of Systems   Review of Systems  Constitutional: Negative for chills, diaphoresis and fever.  HENT: Negative.   Respiratory: Positive for chest tightness and shortness of breath.   Cardiovascular: Negative.   Gastrointestinal: Negative.  Negative for nausea.  Genitourinary: Negative.   Musculoskeletal: Negative.   Skin: Negative.   Neurological: Positive for dizziness.  Facial tingling    Physical Exam Updated Vital Signs BP 110/74   Pulse 89   Temp 98.1 F (36.7 C) (Oral)   Resp 20   Ht 4\' 11"  (1.499 m)   Wt 60.8 kg   SpO2 100%   BMI 27.06 kg/m   Physical Exam Vitals and nursing note reviewed.  Constitutional:      Appearance: She is well-developed.  HENT:     Head: Normocephalic.  Cardiovascular:     Rate and Rhythm: Normal rate and regular rhythm.  Pulmonary:     Effort: Pulmonary effort is normal.     Breath sounds: Normal breath sounds. No wheezing, rhonchi or rales.  Chest:     Chest wall: No tenderness.  Abdominal:     General: Bowel sounds are normal.     Palpations: Abdomen is soft.     Tenderness: There is no abdominal tenderness. There is no guarding or rebound.  Musculoskeletal:        General: Normal range of motion.     Cervical back: Normal range of motion and neck supple.  Skin:    General: Skin is warm and dry.     Findings: No rash.  Neurological:     Mental Status: She is alert and oriented to person, place, and time.     ED Results / Procedures / Treatments   Labs (all labs ordered are listed, but only abnormal results are displayed) Labs Reviewed - No data to display  EKG None  Radiology No results found.  Procedures Procedures (including critical  care time)  Medications Ordered in ED Medications - No data to display  ED Course  I have reviewed the triage vital signs and the nursing notes.  Pertinent labs & imaging results that were available during my care of the patient were reviewed by me and considered in my medical decision making (see chart for details).    MDM Rules/Calculators/A&P                      Patient to ED with symptoms familiar to her as her anxiety and panic that are occurring more frequently.   She is well appearing. Initially tachycardic and hypertensive on arrival, VS now normal. EKG without acute changes. No symptoms of infection or illness.   Discussed taking her Xanax TID as prescribed, through the day to better control symptoms. Also discussed the importance of PCP follow up this week. Patient reassured about the post-vaccine arm soreness.   Final Clinical Impression(s) / ED Diagnoses Final diagnoses:  None   1. Panic/anxiety  Rx / DC Orders ED Discharge Orders    None       , PA-C 08/21/19 0051    08/23/19, MD 08/21/19 [phone]

## 2019-09-05 ENCOUNTER — Ambulatory Visit
Admission: RE | Admit: 2019-09-05 | Discharge: 2019-09-05 | Disposition: A | Discharge status: Home / Self Care | Payer: Medicare Other | Source: Ambulatory Visit | Attending: Orthopedic Surgery | Admitting: Orthopedic Surgery

## 2019-09-05 ENCOUNTER — Other Ambulatory Visit: Payer: Self-pay

## 2019-09-05 DIAGNOSIS — M25511 Pain in right shoulder: Secondary | ICD-10-CM

## 2019-11-20 ENCOUNTER — Other Ambulatory Visit: Payer: Self-pay | Admitting: Cardiovascular Disease

## 2019-11-20 ENCOUNTER — Other Ambulatory Visit: Payer: Self-pay

## 2019-11-20 ENCOUNTER — Ambulatory Visit
Admission: RE | Admit: 2019-11-20 | Discharge: 2019-11-20 | Disposition: A | Discharge status: Home / Self Care | Payer: Medicare Other | Source: Ambulatory Visit | Attending: Cardiovascular Disease | Admitting: Cardiovascular Disease

## 2019-11-20 DIAGNOSIS — M25561 Pain in right knee: Secondary | ICD-10-CM

## 2020-03-09 IMAGING — CT CT RENAL STONE PROTOCOL
2 of 4 series · 16 of 46 positions shown, 18 images · non-contrast
Comparison: 10/26/2017

CLINICAL DATA: Left flank pain, nausea

EXAM:
CT ABDOMEN AND PELVIS WITHOUT CONTRAST
TECHNIQUE: Multidetector CT imaging of the abdomen and pelvis was performed
following the standard protocol without IV contrast.

[Series 3: ap without · axial · non-contrast · 0.59mm/px · z∈[-430,-80]mm · 13 of 81 slices shown, 15 images]
[im 6/81  soft-tissue]
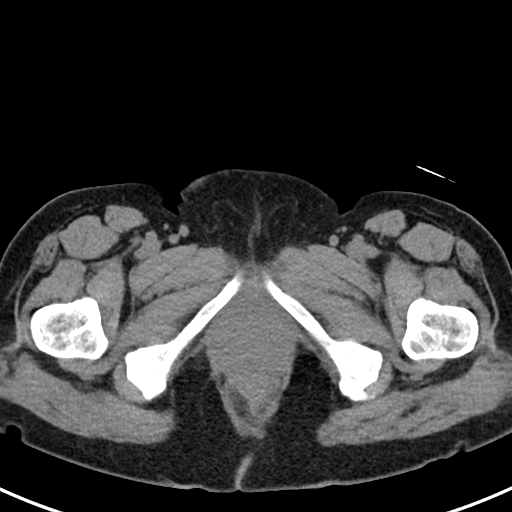
[im 6/81  bone]
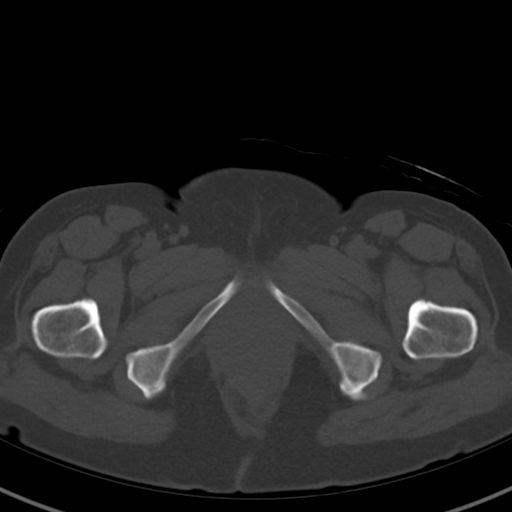
[im 11/81  soft-tissue]
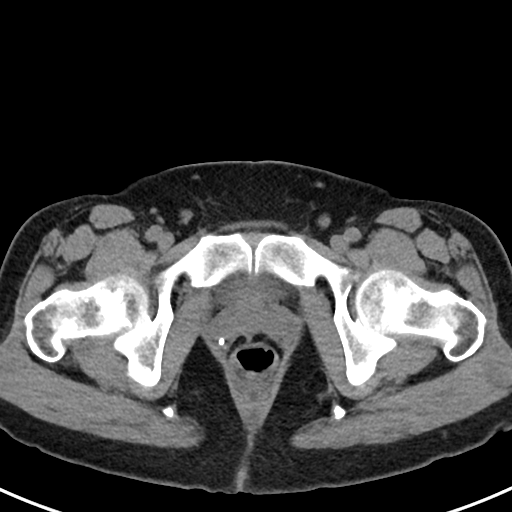
[im 16/81  soft-tissue]
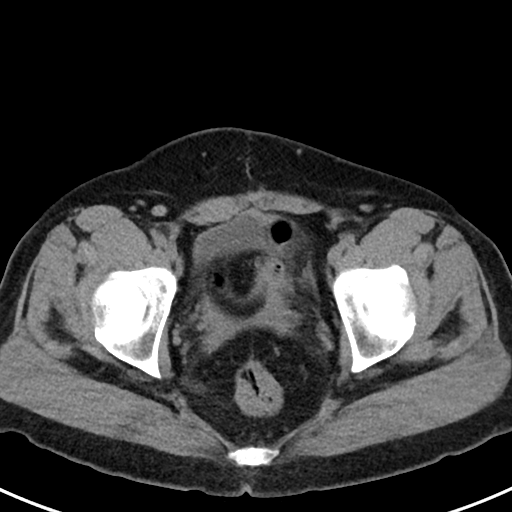
[im 26/81  soft-tissue]
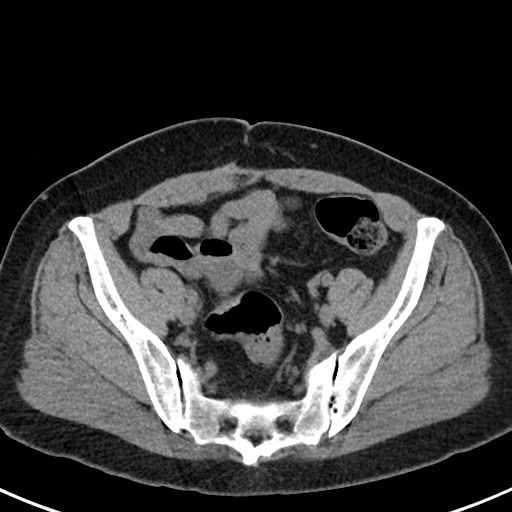
[im 31/81  soft-tissue]
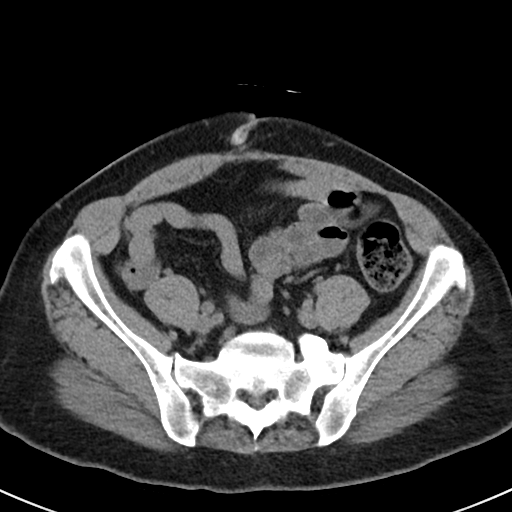
[im 36/81  soft-tissue]
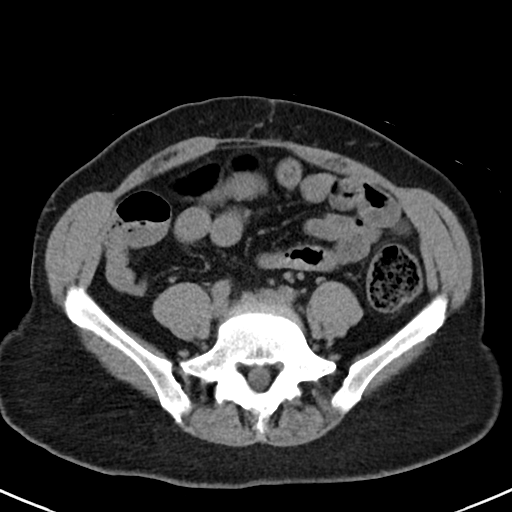
[im 41/81  soft-tissue]
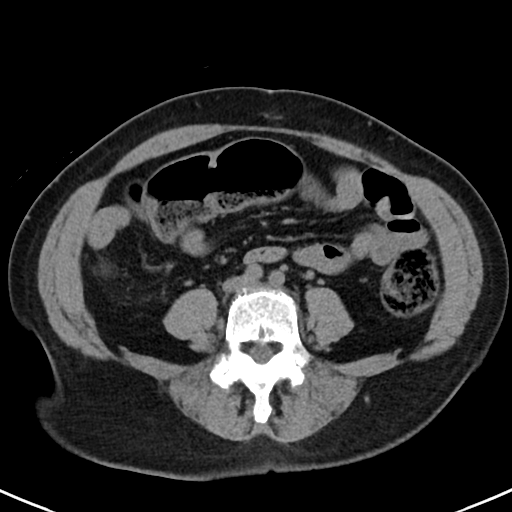
[im 46/81  soft-tissue]
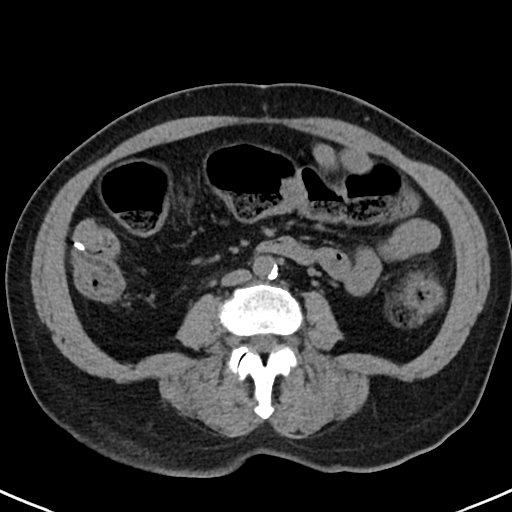
[im 51/81  soft-tissue]
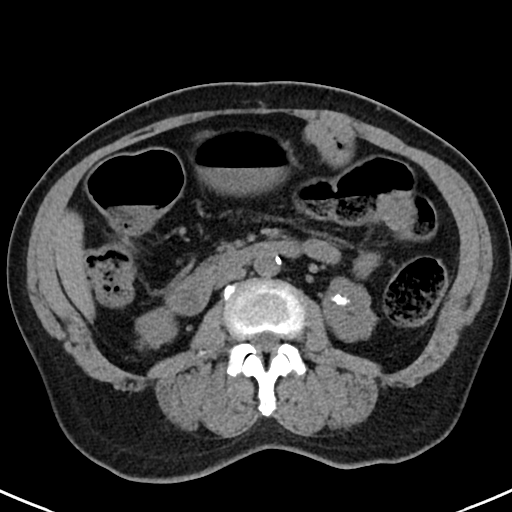
[im 51/81  bone]
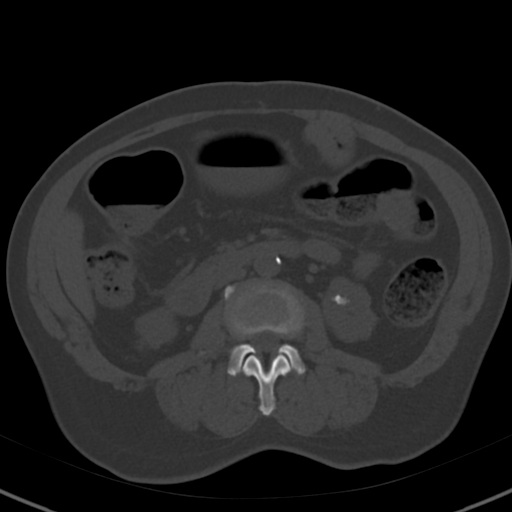
[im 56/81  soft-tissue]
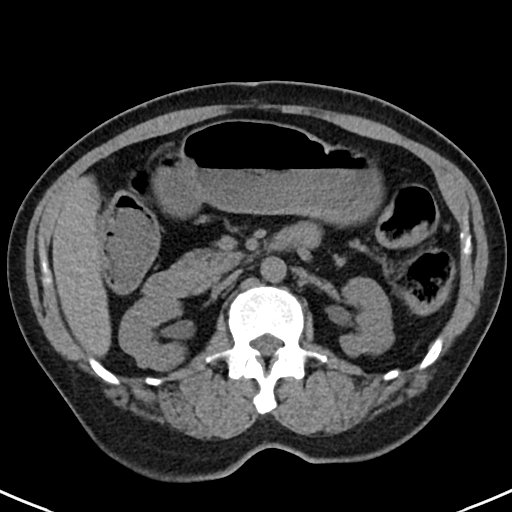
[im 66/81  soft-tissue]
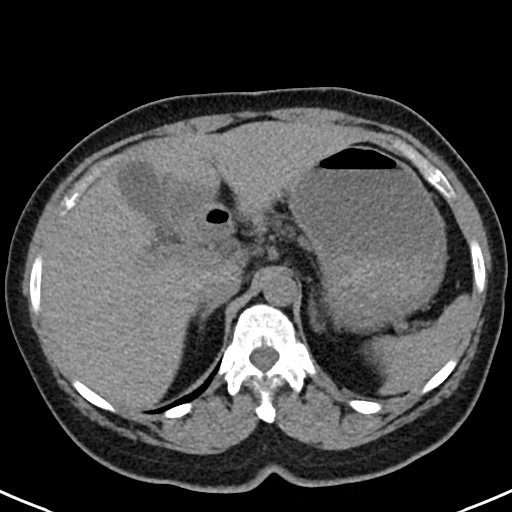
[im 71/81  soft-tissue]
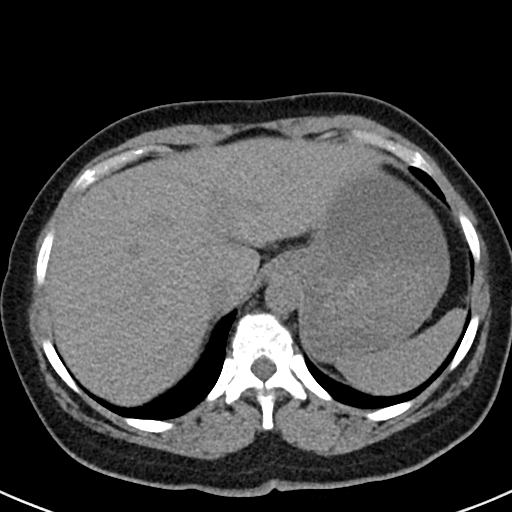
[im 76/81  soft-tissue]
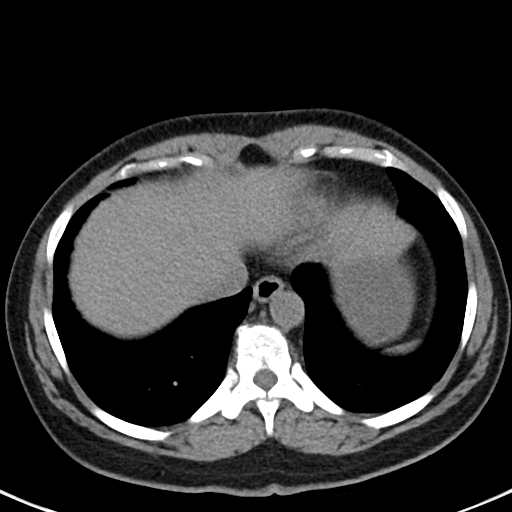

[Series 6: cor · coronal · 0.58mm/px · 3 of 77 slices shown]
[im 26/77  soft-tissue]
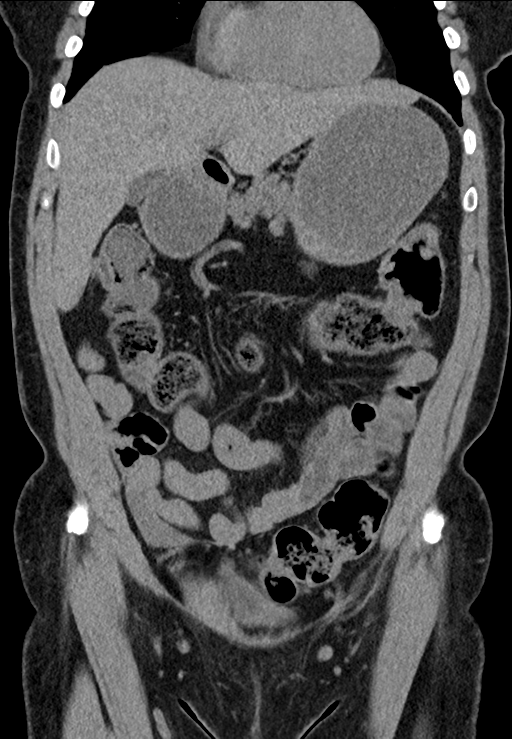
[im 34/77  soft-tissue]
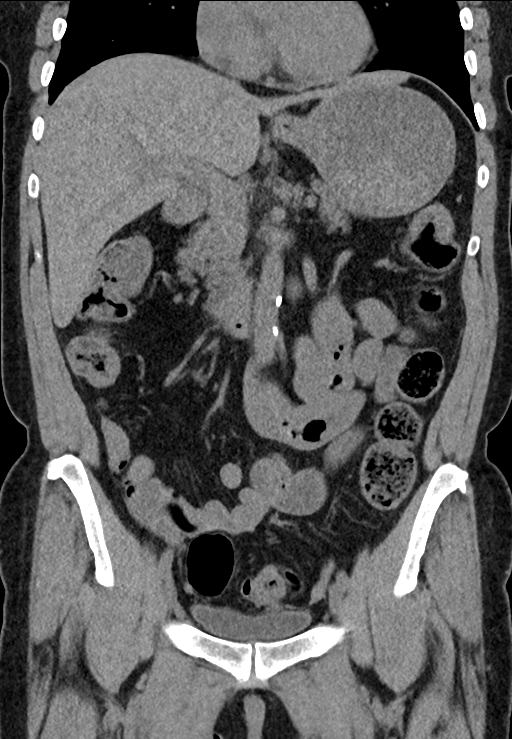
[im 43/77  soft-tissue]
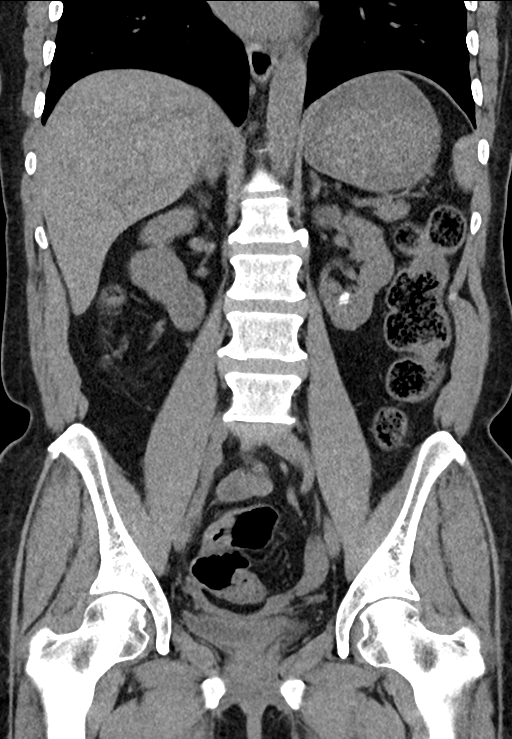

[16 of 46 positions shown; findings below may reference images not displayed]

FINDINGS: Lower chest: Lung bases are clear. No effusions. Heart is normal
size.

Hepatobiliary: No focal hepatic abnormality. Gallbladder
unremarkable.

Pancreas: No focal abnormality or ductal dilatation.

Spleen: No focal abnormality.  Normal size.

Adrenals/Urinary Tract: Multiple low-density lesions within the
kidneys bilaterally. Areas of cortical thinning and scarring. 7 mm
stone in the lower pole of the left kidney. Punctate nonobstructing
stone in the upper pole of the right kidney. No ureteral stones or
hydronephrosis. Adrenal glands and urinary bladder unremarkable.

Stomach/Bowel: Mild gaseous distention of the stomach and large
bowel. Moderate stool burden throughout the colon. No evidence of
bowel obstruction.

Vascular/Lymphatic: Aortic atherosclerosis. No aneurysm or
adenopathy.

Reproductive: Prior hysterectomy.  No adnexal masses.

Other: No free fluid or free air.

Musculoskeletal: No acute bony abnormality.
IMPRESSION: Bilateral nephrolithiasis, the largest in the lower pole of the left
kidney measuring 7 mm. No ureteral stones or hydronephrosis.

Aortic atherosclerosis.

Mild gaseous distention of the stomach and large bowel could reflect
mild ileus. Moderate stool burden in the colon.

Aortic atherosclerosis.

## 2020-03-20 ENCOUNTER — Ambulatory Visit: Payer: Medicare Other | Attending: Orthopedic Surgery

## 2020-04-04 ENCOUNTER — Other Ambulatory Visit: Payer: Self-pay | Admitting: Gastroenterology

## 2020-04-04 DIAGNOSIS — R1310 Dysphagia, unspecified: Secondary | ICD-10-CM

## 2020-04-08 ENCOUNTER — Other Ambulatory Visit: Payer: Self-pay

## 2020-04-08 ENCOUNTER — Ambulatory Visit: Payer: Medicare Other | Attending: Orthopedic Surgery

## 2020-04-08 DIAGNOSIS — G8929 Other chronic pain: Secondary | ICD-10-CM | POA: Diagnosis present

## 2020-04-08 DIAGNOSIS — M7501 Adhesive capsulitis of right shoulder: Secondary | ICD-10-CM | POA: Diagnosis present

## 2020-04-08 DIAGNOSIS — M6281 Muscle weakness (generalized): Secondary | ICD-10-CM | POA: Diagnosis present

## 2020-04-08 DIAGNOSIS — M25561 Pain in right knee: Secondary | ICD-10-CM | POA: Diagnosis present

## 2020-04-08 DIAGNOSIS — R293 Abnormal posture: Secondary | ICD-10-CM

## 2020-04-08 DIAGNOSIS — M25511 Pain in right shoulder: Secondary | ICD-10-CM | POA: Insufficient documentation

## 2020-04-08 DIAGNOSIS — M25611 Stiffness of right shoulder, not elsewhere classified: Secondary | ICD-10-CM | POA: Diagnosis present
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
## 2020-04-14 ENCOUNTER — Other Ambulatory Visit: Payer: Medicare Other

## 2020-04-22 ENCOUNTER — Ambulatory Visit: Payer: Medicare Other

## 2020-04-24 ENCOUNTER — Ambulatory Visit: Payer: Medicare Other | Admitting: Rehabilitative and Restorative Service Providers"

## 2020-04-29 ENCOUNTER — Encounter: Payer: Medicare Other | Admitting: Rehabilitative and Restorative Service Providers"

## 2020-05-01 ENCOUNTER — Ambulatory Visit: Payer: Medicare Other

## 2020-05-01 ENCOUNTER — Other Ambulatory Visit: Payer: Self-pay

## 2020-05-01 DIAGNOSIS — M25511 Pain in right shoulder: Secondary | ICD-10-CM

## 2020-05-01 DIAGNOSIS — M6281 Muscle weakness (generalized): Secondary | ICD-10-CM

## 2020-05-01 DIAGNOSIS — M25611 Stiffness of right shoulder, not elsewhere classified: Secondary | ICD-10-CM

## 2020-05-01 DIAGNOSIS — G8929 Other chronic pain: Secondary | ICD-10-CM

## 2020-05-01 DIAGNOSIS — R293 Abnormal posture: Secondary | ICD-10-CM

## 2020-05-01 DIAGNOSIS — M7501 Adhesive capsulitis of right shoulder: Secondary | ICD-10-CM
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
## 2020-05-06 ENCOUNTER — Other Ambulatory Visit: Payer: Self-pay

## 2020-05-06 ENCOUNTER — Ambulatory Visit: Payer: Medicare Other | Attending: Orthopedic Surgery

## 2020-05-06 DIAGNOSIS — R293 Abnormal posture: Secondary | ICD-10-CM | POA: Insufficient documentation

## 2020-05-06 DIAGNOSIS — M7501 Adhesive capsulitis of right shoulder: Secondary | ICD-10-CM | POA: Diagnosis present

## 2020-05-06 DIAGNOSIS — G8929 Other chronic pain: Secondary | ICD-10-CM | POA: Insufficient documentation

## 2020-05-06 DIAGNOSIS — M25511 Pain in right shoulder: Secondary | ICD-10-CM | POA: Diagnosis not present

## 2020-05-06 DIAGNOSIS — M25561 Pain in right knee: Secondary | ICD-10-CM | POA: Diagnosis present

## 2020-05-06 DIAGNOSIS — M6281 Muscle weakness (generalized): Secondary | ICD-10-CM | POA: Insufficient documentation

## 2020-05-06 DIAGNOSIS — M25611 Stiffness of right shoulder, not elsewhere classified: Secondary | ICD-10-CM | POA: Diagnosis present

## 2020-05-06 NOTE — Therapy (Signed)
[facility] Outpatient Rehabilitation [facility] [address] [place], [place], [address] Phone: [phone]   Fax:  [phone]  Physical Therapy Treatment  Patient Details  Name: Whitney Parrish MRN: [id number] Date of Birth: [birth year] Referring Provider (PT): Salvatore Marvel  MD   Encounter Date: 05/06/2020   PT End of Session - 05/06/20 0915    Visit Number 3    Number of Visits 12    Date for PT Re-Evaluation 05/16/20    Authorization Type MCR MCD    Authorization Time Period progress note visit 10   FOTO visti 6 and 12     KX  visit 15    Progress Note Due on Visit 10    PT Start Time 0915    PT Stop Time 0958    PT Time Calculation (min) 43 min    Activity Tolerance Patient tolerated treatment well;Patient limited by pain    Behavior During Therapy [facility] for tasks assessed/performed           Past Medical History:  Diagnosis Date  . Anxiety   . Arthritis    shoulders and hands  . Benign positional vertigo   . Chronically dry eyes   . CKD (chronic kidney disease), stage III (HCC)   . Depression   . Dyspnea on exertion   . Hematuria   . History of acute pyelonephritis   . History of Bell's palsy    1980's left side-- residual lip numbness intermittant  . History of chest pain    non-cardiac per dr Algie Coffer notes (pt's pcp)  . History of panic attacks   . Left ureteral stone   . Loose bowel movements    chronic-per pt residual from injury during abdominal surgery in 1986  . Migraines   . Nephrolithiasis    left   . Psoriasis   . Urgency of urination   . Wears dentures    has upper and lower but only wears upper    Past Surgical History:  Procedure Laterality Date  . BREAST CYST EXCISION  12/15/2011   Procedure: CYST EXCISION BREAST;  Surgeon: Shelly Rubenstein, MD;  Location: [facility];  Service: General;  Laterality: Left;  Excision of a Chronic Left Breast Sebaceous Cyst  . CARDIOVASCULAR STRESS TEST  12-28-2012   Low risk nuclear study w/  small LV cavity which may be due LVH/  no ischemia or infarct/  normal wall motion, ef 67%  . CYSTOSCOPY WITH STENT PLACEMENT Left 09/08/2015   Procedure: CYSTOSCOPY WITH LEFT STENT PLACEMENT;  Surgeon: Bjorn Pippin, MD;  Location: [facility];  Service: Urology;  Laterality: Left;  . CYSTOSCOPY WITH URETEROSCOPY AND STENT PLACEMENT Left 09/25/2015   Procedure: LEFT  URETEROSCOPY STONE EXTRACTION;  Surgeon: Bjorn Pippin, MD;  Location: [facility];  Service: Urology;  Laterality: Left;  . EXCISION AND REVISION SCAR ABDOMINAL WALL  01-22-2002  . EXPLORATORY LAPAROTOMY  1986   w/ Abdominal Myomectomy  (per pt bowel muscle cut (injury))  . HOLMIUM LASER APPLICATION Left 09/25/2015   Procedure: HOLMIUM LASER APPLICATION;  Surgeon: Bjorn Pippin, MD;  Location: [facility];  Service: Urology;  Laterality: Left;  . I & D LEFT BUTTOCK ABSCESS   07-01-2010   x2 area's  . NEGATIVE SLEEP STUDY  08-10-2005  . TRANSTHORACIC ECHOCARDIOGRAM  12-28-2012   grade 1 diastolic dysfunction, mild concentric LVH, ef 65-70%/  mild MR/  trivial PR and TR    There were no vitals filed for  this visit.   Subjective Assessment - 05/06/20 0918    Subjective "I am feeling much better today than I was because I had my booster on Thursday in my right arm." Pt reports feeling sore after performing HEP but states she can notice a difference in her R knee pain after using the roller.    Limitations House hold activities;Lifting   self care,  reach to cabinets.   How long can you walk comfortably? houshold distances  shopped on feet and was on feet 20 min    Diagnostic tests Xray;  knee neative   MRI shoulder   no full thickness tears but some tendonoatphy and interstitial tears    Patient Stated Goals She wants to get shoulder back in order. Walk without knee giving out.    Currently in Pain? Yes    Pain Score 8     Pain Location Shoulder    Pain Orientation Right;Anterior;Posterior    Pain Descriptors /  Indicators Tender;Sore;Tightness    Pain Type Chronic pain    Pain Onset More than a month ago    Pain Score 6    Pain Location Knee    Pain Orientation Right;Lateral;Posterior    Pain Descriptors / Indicators Aching;Tightness    Pain Onset More than a month ago              [facility] PT Assessment - 05/06/20 0001      Assessment   Medical Diagnosis adhesive capsulitis , Rhomboid pain, Lt knee OA    Referring Provider (PT) Elsie Saas  MD                         [facility] Adult PT Treatment/Exercise - 05/06/20 0001      Self-Care   Self-Care Other Self-Care Comments    Other Self-Care Comments  Reviewed HEP and self-STM/myofascial release. Advised to continue AAROM and AROM within tolerance at home - pt reports using a golf club to perform AAROM at home currently.      Knee/Hip Exercises: Supine   Quad Sets Strengthening;Right;10 reps      Shoulder Exercises: Supine   Flexion Limitations AAROM R shoulder FL with dowel x 15    ABduction Limitations AAROM R shoulder ABD with dowel x 15      Shoulder Exercises: Seated   Retraction AROM;Strengthening;Both;15 reps      Shoulder Exercises: Pulleys   Flexion 2 minutes    Flexion Limitations Limited range secondary to increased pain      Manual Therapy   Manual Therapy Soft tissue mobilization;Myofascial release;Passive ROM;Joint mobilization    Joint Mobilization Attempted grades I-II inferior GHJ mobilizations for pain reduction but pt with significant TTP    Soft tissue mobilization STM and myofascial release along R shoulder and periscapular musculature, R upper trap, R TFL, R HS, and R IT band all within tolerance    Passive ROM R shoulder PROM and R knee PROM in all planes within tolerance                  PT Education - 05/06/20 1353    Education Details Reviewed HEP and self-STM/myofascial release. Advised to continue AAROM and AROM within tolerance at home - pt reports using a golf club to perform  AAROM at home currently.    Person(s) Educated Patient    Methods Explanation;Demonstration;Verbal cues;Tactile cues    Comprehension Verbalized understanding;Returned demonstration;Verbal cues required;Need further instruction;Tactile cues required  PT Short Term Goals - 05/01/20 1210      PT SHORT TERM GOAL #1   Title She will be indpendent with initial HEP    Baseline Issued initial HEP 05/01/2020    Time 3    Period Weeks    Status On-going      PT SHORT TERM GOAL #2   Title She will be able to reach to 110 degree flexion RT shoulder    Time 3    Period Weeks    Status On-going      PT SHORT TERM GOAL #3   Title FOTO score improved to 40% RT knee    Time 3    Period Weeks    Status On-going      PT SHORT TERM GOAL #4   Title FOTO improve to 53% for RT shoulder    Time 3    Period Weeks    Status On-going             PT Long Term Goals - 05/01/20 1211      PT LONG TERM GOAL #1   Title She will be able to perform all HEP issued    Time 6    Period Weeks    Status New      PT LONG TERM GOAL #2   Title FOTO score improved to 56% for R shoulder and 43% for R knee.    Time 6    Period Weeks    Status New      PT LONG TERM GOAL #3   Title RT shoulder ROM improved to   flexion   ER    Time 6    Period Weeks    Status New                 Plan - 05/06/20 0947    Clinical Impression Statement Patient had fair tolerance to therapy session but presents with increased tenderness/soreness in R shoulder due to receiving Covid booster along proximal R shoulder on Thursday. Pt reports pain during myofascial release and STM but states "it feels good too". Pt will benefit from continued skilled PT intervention to address deficits and progress towards functional goals.    Personal Factors and Comorbidities Past/Current Experience    Examination-Activity Limitations Reach Overhead;Lift;Carry;Dressing    Examination-Participation Restrictions Community  Activity;Laundry;Shop;Volunteer    Stability/Clinical Decision Making Evolving/Moderate complexity    Rehab Potential Good    PT Frequency 2x / week    PT Duration 6 weeks    PT Treatment/Interventions Iontophoresis 4mg /ml Dexamethasone;Moist Heat;Cryotherapy;Therapeutic activities;Therapeutic exercise;Manual techniques;Patient/family education;Passive range of motion;Dry needling;Electrical Stimulation;Taping    PT Next Visit Plan Re-evaluate. Assess response to HEP. Light A/AROM and PROM to tolerance, HS and IT band stretches, quad strength. Isometrics. Manual techniques for pain relief and ROM as tolerated. Modalities PRN.    PT Home Exercise Plan [id number] - heel slide with strap, IT band stretch with strap, scapular retraction, seated HS stretch    Consulted and Agree with Plan of Care Patient           Patient will benefit from skilled therapeutic intervention in order to improve the following deficits and impairments:  Impaired UE functional use,Pain,Decreased activity tolerance,Decreased range of motion,Decreased strength,Postural dysfunction  Visit Diagnosis: Chronic right shoulder pain  Stiffness of right shoulder, not elsewhere classified  Adhesive capsulitis of right shoulder  Chronic pain of right knee  Muscle weakness (generalized)  Abnormal posture     Problem List Patient Active  Problem List   Diagnosis Date Noted  . Acute pyonephrosis 09/09/2015  . Chronic renal insufficiency 09/09/2015  . Acute renal insufficiency 09/09/2015  . Obstructive uropathy 09/08/2015  . Left nephrolithiasis 09/08/2015  . Chest pain at rest 11/15/2011  . Dizziness 11/15/2011  . Breast abscess 10/06/2011    Rhea Bleacher, PT, DPT 05/06/20 1:58 PM  [facility] [address] [place], [place], [address] Phone: [phone]   Fax:  [phone]  Name: Whitney Parrish MRN: [id number] Date of Birth: Dec 01, 1955

## 2020-05-07 ENCOUNTER — Other Ambulatory Visit: Payer: Medicare Other

## 2020-05-08 ENCOUNTER — Ambulatory Visit: Payer: Medicare Other

## 2020-05-08 ENCOUNTER — Other Ambulatory Visit: Payer: Self-pay

## 2020-05-08 DIAGNOSIS — M7501 Adhesive capsulitis of right shoulder: Secondary | ICD-10-CM

## 2020-05-08 DIAGNOSIS — M25611 Stiffness of right shoulder, not elsewhere classified: Secondary | ICD-10-CM

## 2020-05-08 DIAGNOSIS — R293 Abnormal posture: Secondary | ICD-10-CM

## 2020-05-08 DIAGNOSIS — G8929 Other chronic pain: Secondary | ICD-10-CM

## 2020-05-08 DIAGNOSIS — M6281 Muscle weakness (generalized): Secondary | ICD-10-CM

## 2020-05-08 DIAGNOSIS — M25561 Pain in right knee: Secondary | ICD-10-CM

## 2020-05-08 DIAGNOSIS — M25511 Pain in right shoulder: Secondary | ICD-10-CM | POA: Diagnosis not present

## 2020-05-08 NOTE — Therapy (Addendum)
[place], [place], [address] Phone: [phone]   Fax:  [phone]  Physical Therapy Treatment/Re-evaluation/Discharge  Patient Details  Name: Whitney Parrish MRN: [id number] Date of Birth: 03/23/1956 Referring Provider (PT): Elsie Saas  MD   Encounter Date: 05/08/2020   PT End of Session - 05/08/20 0903    Visit Number 4    Number of Visits 18    Date for PT Re-Evaluation 07/05/20    Authorization Type MCR MCD    Authorization Time Period progress note visit 10   FOTO visti 6 and 12     KX  visit 15    Progress Note Due on Visit 10    PT Start Time 0912    PT Stop Time 0953    PT Time Calculation (min) 41 min    Activity Tolerance Patient tolerated treatment well;Patient limited by pain    Behavior During Therapy [facility] for tasks assessed/performed           Past Medical History:  Diagnosis Date  . Anxiety   . Arthritis    shoulders and hands  . Benign positional vertigo   . Chronically dry eyes   . CKD (chronic kidney disease), stage III ([place])   . Depression   . Dyspnea on exertion   . Hematuria   . History of acute pyelonephritis   . History of Bell's palsy    1980's left side-- residual lip numbness intermittant  . History of chest pain    non-cardiac per dr Doylene Canard notes (pt's pcp)  . History of panic attacks   . Left ureteral stone   . Loose bowel movements    chronic-per pt residual from injury during abdominal surgery in 1986  . Migraines   . Nephrolithiasis    left   . Psoriasis   . Urgency of urination   . Wears dentures    has upper and lower but only wears upper    Past Surgical History:  Procedure Laterality Date  . BREAST CYST EXCISION  12/15/2011   Procedure: CYST EXCISION BREAST;  Surgeon: Harl Bowie, MD;  Location: [facility];  Service: General;  Laterality: Left;  Excision of a Chronic Left Breast Sebaceous Cyst  . CARDIOVASCULAR STRESS TEST  12-28-2012    Low risk nuclear study w/ small LV cavity which may be due LVH/  no ischemia or infarct/  normal wall motion, ef 67%  . CYSTOSCOPY WITH STENT PLACEMENT Left 09/08/2015   Procedure: CYSTOSCOPY WITH LEFT STENT PLACEMENT;  Surgeon: Irine Seal, MD;  Location: [facility];  Service: Urology;  Laterality: Left;  . CYSTOSCOPY WITH URETEROSCOPY AND STENT PLACEMENT Left 09/25/2015   Procedure: LEFT  URETEROSCOPY STONE EXTRACTION;  Surgeon: Irine Seal, MD;  Location: [facility];  Service: Urology;  Laterality: Left;  . EXCISION AND REVISION SCAR ABDOMINAL WALL  01-22-2002  . EXPLORATORY LAPAROTOMY  1986   w/ Abdominal Myomectomy  (per pt bowel muscle cut (injury))  . HOLMIUM LASER APPLICATION Left 7/68/0881   Procedure: HOLMIUM LASER APPLICATION;  Surgeon: Irine Seal, MD;  Location: [facility];  Service: Urology;  Laterality: Left;  . I & D LEFT BUTTOCK ABSCESS   07-01-2010   x2 area's  . NEGATIVE SLEEP STUDY  08-10-2005  . TRANSTHORACIC ECHOCARDIOGRAM  12-28-2012   grade 1 diastolic dysfunction, mild concentric LVH, ef 65-70%/  mild MR/  trivial PR and TR    There were no vitals filed for  this visit.   Subjective Assessment - 05/08/20 0903    Subjective "I am just feeling sore but might have slept on it funny last night. I have been getting some tingling in the back of this right knee sometimes."    Limitations House hold activities;Lifting   self care,  reach to cabinets.   How long can you walk comfortably? houshold distances  shopped on feet and was on feet 20 min    Diagnostic tests Xray;  knee neative   MRI shoulder   no full thickness tears but some tendonoatphy and interstitial tears    Patient Stated Goals She wants to get shoulder back in order. Walk without knee giving out.    Currently in Pain? Yes    Pain Score 7     Pain Location Shoulder    Pain Orientation Right;Anterior;Posterior    Pain Descriptors / Indicators Tender;Sore;Tightness    Pain Type Chronic  pain    Pain Onset More than a month ago    Pain Score 5    Pain Location Knee    Pain Orientation Right;Lateral;Posterior    Pain Descriptors / Indicators Aching;Tightness;Sore    Pain Radiating Towards occasional tingling in R popliteal region    Pain Onset More than a month ago              [facility] PT Assessment - 05/08/20 0001      Assessment   Medical Diagnosis adhesive capsulitis , Rhomboid pain, Lt knee OA    Referring Provider (PT) Elsie Saas  MD      Observation/Other Assessments   Focus on Therapeutic Outcomes (FOTO)  04/08/2020: shoulder 48%,  knee 29%. Will reassess at 6th visit      AROM   Overall AROM Comments Pt with increased soreness in R shoulder today    Right/Left Shoulder Right;Left    Right Shoulder Flexion 70 Degrees   78 if pt keeps elbow flexed   Right Shoulder ABduction 55 Degrees    Right Shoulder Internal Rotation --   sacrum then uses L hand to pull R hand further   Right Shoulder External Rotation --   back of head   Right Shoulder Horizontal ABduction 4 Degrees    Right Shoulder Horizontal  ADduction 100 Degrees    Left Shoulder Extension 45 Degrees    Left Shoulder Flexion 128 Degrees    Left Shoulder ABduction 110 Degrees    Left Shoulder Internal Rotation --   T12   Left Shoulder External Rotation --   C6   Left Shoulder Horizontal ABduction 30 Degrees    Left Shoulder Horizontal ADduction 100 Degrees    Right/Left Knee Left;Right    Right Knee Extension -1    Right Knee Flexion 120    Left Knee Extension 0    Left Knee Flexion 128      Strength   Overall Strength Comments Pain with all resisted shoulder and knee motions    Right Knee Flexion 3+/5    Right Knee Extension 3+/5      Palpation   Palpation comment Significant TTP along entire shoulder and scapular musculature. TTP along R knee, IT band, hip FL                         [facility] Adult PT Treatment/Exercise - 05/08/20 0001      Self-Care   Self-Care Other  Self-Care Comments    Other Self-Care Comments  Reviewed HEP, desensitization,  and POC. Pt reports having upcoming doctor's appointment - suggested she speak with doctor regarding potential further R shoulder assessment due to significant strength and mobility deficits.      Shoulder Exercises: Supine   Flexion Limitations AAROM R shoulder FL with dowel x 10 then pt had aggravation of symptoms and a sharp pain along R scapular musculature with signiifcant TTP across R side shoulder/back      Shoulder Exercises: Seated   Retraction AROM;Strengthening;Both;15 reps      Shoulder Exercises: Pulleys   Flexion 5 minutes    Flexion Limitations Limited range secondary to increased pain      Manual Therapy   Manual Therapy Soft tissue mobilization    Soft tissue mobilization Poor tolerance to STM along R shoulder and scapular musculature today secondary to increased pain and tenderness                  PT Education - 05/08/20 1348    Education Details Reviewed HEP, desensitization, and POC. Pt reports having upcoming doctor's appointment - suggested she speak with doctor regarding potential further R shoulder assessment due to significant strength and mobility deficits.    Person(s) Educated Patient    Methods Explanation;Demonstration;Tactile cues;Verbal cues    Comprehension Verbalized understanding;Returned demonstration;Verbal cues required;Tactile cues required;Need further instruction            PT Short Term Goals - 05/08/20 1353      PT SHORT TERM GOAL #1   Title She will be indpendent with initial HEP    Baseline Issued initial HEP 05/01/2020    Time 3    Period Weeks    Status Achieved      PT SHORT TERM GOAL #2   Title She will be able to reach to 110 degree flexion RT shoulder    Time 3    Period Weeks    Status On-going      PT SHORT TERM GOAL #3   Title FOTO score improved to 40% RT knee    Time 3    Period Weeks    Status On-going      PT SHORT TERM  GOAL #4   Title FOTO improve to 53% for RT shoulder    Time 3    Period Weeks    Status On-going             PT Long Term Goals - 05/08/20 1353      PT LONG TERM GOAL #1   Title She will be able to perform all HEP issued    Time 6    Period Weeks    Status On-going      PT LONG TERM GOAL #2   Title FOTO score improved to 56% for R shoulder and 43% for R knee.    Time 6    Period Weeks    Status On-going      PT LONG TERM GOAL #3   Title RT shoulder ROM improved to   flexion   ER    Time 6    Period Weeks    Status On-going                 Plan - 05/08/20 0904    Clinical Impression Statement Patient presents to OPPT for her 4th visit since evaluation 04/08/2020. She has increased soreness and explains she is still somewhat sore from Covid booster and feels like she slept funny last night since her shoulder has been aggravated since this  morning. She continues to demonstrate limited R shoulder and R knee strength and mobility. Pt reports having upcoming doctor's appointment - suggested she speak with doctor regarding potential further R shoulder assessment due to significant strength and mobility deficits. She should benefit from continued skilled PT intervention to address deficits and allow for improved tolerance with functional tasks.    Personal Factors and Comorbidities Past/Current Experience    Examination-Activity Limitations Reach Overhead;Lift;Carry;Dressing    Examination-Participation Restrictions Community Activity;Laundry;Shop;Volunteer    Stability/Clinical Decision Making Evolving/Moderate complexity    Rehab Potential Good    PT Frequency 2x / week    PT Duration 6 weeks    PT Treatment/Interventions Iontophoresis 32m/ml Dexamethasone;Moist Heat;Cryotherapy;Therapeutic activities;Therapeutic exercise;Manual techniques;Patient/family education;Passive range of motion;Dry needling;Electrical Stimulation;Taping    PT Next Visit Plan Review and update HEP  PRN..Sunday CornA/AROM and PROM to tolerance, HS and IT band stretches, quad strength. Isometrics. Manual techniques for pain relief and ROM as tolerated. Modalities PRN.    PT Home Exercise Plan [id number] - heel slide with strap, IT band stretch with strap, scapular retraction, seated HS stretch    Consulted and Agree with Plan of Care Patient           Patient will benefit from skilled therapeutic intervention in order to improve the following deficits and impairments:  Impaired UE functional use,Pain,Decreased activity tolerance,Decreased range of motion,Decreased strength,Postural dysfunction  Visit Diagnosis: Chronic right shoulder pain  Stiffness of right shoulder, not elsewhere classified  Adhesive capsulitis of right shoulder  Chronic pain of right knee  Muscle weakness (generalized)  Abnormal posture     Problem List Patient Active Problem List   Diagnosis Date Noted  . Acute pyonephrosis 09/09/2015  . Chronic renal insufficiency 09/09/2015  . Acute renal insufficiency 09/09/2015  . Obstructive uropathy 09/08/2015  . Left nephrolithiasis 09/08/2015  . Chest pain at rest 11/15/2011  . Dizziness 11/15/2011  . Breast abscess 10/06/2011   PHYSICAL THERAPY DISCHARGE SUMMARY  Visits from Start of Care: 4  Current functional level related to goals / functional outcomes: See above   Remaining deficits: See above   Education / Equipment: See above  Plan: Patient agrees to discharge.  Patient goals were not met. Patient is being discharged due to not returning since the last visit.  ?????      KHaydee Monica PT, DPT 07/03/20 11:01 AM  [facility] [address]Phone: [phone]  Fax:  [phone] Name: Whitney Parrish: [id number] of Birth: [birth year]

## 2020-06-17 ENCOUNTER — Other Ambulatory Visit: Payer: Self-pay | Admitting: Orthopedic Surgery

## 2020-06-26 ENCOUNTER — Ambulatory Visit (INDEPENDENT_AMBULATORY_CARE_PROVIDER_SITE_OTHER): Payer: Medicare Other | Admitting: Podiatry

## 2020-06-26 ENCOUNTER — Ambulatory Visit (INDEPENDENT_AMBULATORY_CARE_PROVIDER_SITE_OTHER): Payer: Medicare Other

## 2020-06-26 ENCOUNTER — Encounter: Payer: Self-pay | Admitting: Podiatry

## 2020-06-26 ENCOUNTER — Other Ambulatory Visit: Payer: Self-pay

## 2020-06-26 DIAGNOSIS — Z1211 Encounter for screening for malignant neoplasm of colon: Secondary | ICD-10-CM | POA: Insufficient documentation

## 2020-06-26 DIAGNOSIS — M778 Other enthesopathies, not elsewhere classified: Secondary | ICD-10-CM

## 2020-06-26 DIAGNOSIS — R1310 Dysphagia, unspecified: Secondary | ICD-10-CM | POA: Insufficient documentation

## 2020-06-26 DIAGNOSIS — S90851A Superficial foreign body, right foot, initial encounter: Secondary | ICD-10-CM

## 2020-06-26 DIAGNOSIS — K219 Gastro-esophageal reflux disease without esophagitis: Secondary | ICD-10-CM | POA: Insufficient documentation

## 2020-06-26 NOTE — Patient Instructions (Signed)

## 2020-06-26 NOTE — Progress Notes (Signed)
Subjective:  Patient ID: KRYSTYNE TEWKSBURY, female    DOB: 08-20-1955,  MRN: [id number] HPI Chief Complaint  Patient presents with  . Foot Pain    Plantar forefoot right - got foot caught on metal door molding about 1 month ago, pulled piece out then, still extremely painful, tried soaking, can't wear real shoe or bear full weight  . New Patient (Initial Visit)    65 y.o. female presents with the above complaint.   ROS: Vital signs are stable she is alert and oriented x3.  Pulses are palpable.  Past Medical History:  Diagnosis Date  . Anxiety   . Arthritis    shoulders and hands  . Benign positional vertigo   . Chronically dry eyes   . CKD (chronic kidney disease), stage III ([facility])   . Depression   . Dyspnea on exertion   . Hematuria   . History of acute pyelonephritis   . History of Bell's palsy    1980's left side-- residual lip numbness intermittant  . History of chest pain    non-cardiac per dr Algie Coffer notes (pt's pcp)  . History of panic attacks   . Left ureteral stone   . Loose bowel movements    chronic-per pt residual from injury during abdominal surgery in 1986  . Migraines   . Nephrolithiasis    left   . Psoriasis   . Urgency of urination   . Wears dentures    has upper and lower but only wears upper   Past Surgical History:  Procedure Laterality Date  . BREAST CYST EXCISION  12/15/2011   Procedure: CYST EXCISION BREAST;  Surgeon: Shelly Rubenstein, MD;  Location: [facility];  Service: General;  Laterality: Left;  Excision of a Chronic Left Breast Sebaceous Cyst  . CARDIOVASCULAR STRESS TEST  12-28-2012   Low risk nuclear study w/ small LV cavity which may be due LVH/  no ischemia or infarct/  normal wall motion, ef 67%  . CYSTOSCOPY WITH STENT PLACEMENT Left 09/08/2015   Procedure: CYSTOSCOPY WITH LEFT STENT PLACEMENT;  Surgeon: Bjorn Pippin, MD;  Location: [facility];  Service: Urology;  Laterality: Left;  . CYSTOSCOPY WITH URETEROSCOPY AND STENT PLACEMENT Left  09/25/2015   Procedure: LEFT  URETEROSCOPY STONE EXTRACTION;  Surgeon: Bjorn Pippin, MD;  Location[facility];  Service: Urology;  Laterality: Left;  . EXCISION AND REVISION SCAR ABDOMINAL WALL  01-22-2002  . EXPLORATORY LAPAROTOMY  1986   w/ Abdominal Myomectomy  (per pt bowel muscle cut (injury))  . HOLMIUM LASER APPLICATION Left 09/25/2015   Procedure: HOLMIUM LASER APPLICATION;  Surgeon: Bjorn Pippin, MD;  Location: [facility];  Service: Urology;  Laterality: Left;  . I & D LEFT BUTTOCK ABSCESS   07-01-2010   x2 area's  . NEGATIVE SLEEP STUDY  08-10-2005  . TRANSTHORACIC ECHOCARDIOGRAM  12-28-2012   grade 1 diastolic dysfunction, mild concentric LVH, ef 65-70%/  mild MR/  trivial PR and TR    Current Outpatient Medications:  .  acetaminophen (TYLENOL) 500 MG tablet, Take 1,000 mg by mouth every 6 (six) hours as needed for headache (pain)., Disp: , Rfl:  .  albuterol (PROVENTIL HFA;VENTOLIN HFA) 108 (90 Base) MCG/ACT inhaler, Inhale 2 puffs into the lungs 3 (three) times daily., Disp: , Rfl:  .  ALPRAZolam (XANAX) 0.25 MG tablet, Take 0.25 mg by mouth at bedtime as needed for anxiety or sleep. , Disp: , Rfl:  .  betamethasone valerate ointment (VALISONE) 0.1 %,  Apply 1 application topically 2 (two) times daily. For plaque psoriasis - apply to affected area and wrap with saran wrap, Disp: , Rfl: 3 .  budesonide-formoterol (SYMBICORT) 160-4.5 MCG/ACT inhaler, Inhale 2 puffs into the lungs 2 (two) times daily., Disp: , Rfl:  .  cyclobenzaprine (FLEXERIL) 10 MG tablet, Take 1 tablet (10 mg total) by mouth 2 (two) times daily as needed for muscle spasms., Disp: 20 tablet, Rfl: 0 .  cycloSPORINE (RESTASIS) 0.05 % ophthalmic emulsion, Place 1 drop into both eyes daily., Disp: , Rfl:  .  dicyclomine (BENTYL) 10 MG capsule, Take 10 mg by mouth 2 (two) times daily as needed for spasms. , Disp: , Rfl:  .  gabapentin (NEURONTIN) 300 MG capsule, Take 300 mg by mouth daily as  needed (Nerve pain). , Disp: , Rfl:  .  Guselkumab (TREMFYA) 100 MG/ML SOPN, Inject 100 mg into the skin every 30 (thirty) days., Disp: , Rfl:  .  ibuprofen (ADVIL) 600 MG tablet, Take 1 tablet (600 mg total) by mouth every 6 (six) hours as needed., Disp: 30 tablet, Rfl: 0 .  meclizine (ANTIVERT) 25 MG tablet, Take 25 mg by mouth 3 (three) times daily as needed for dizziness (Vertigo). , Disp: , Rfl:  .  metoCLOPramide (REGLAN) 5 MG tablet, Take 5 mg by mouth 2 (two) times daily., Disp: , Rfl:  .  nitroGLYCERIN (NITROSTAT) 0.4 MG SL tablet, Place 1 tablet (0.4 mg total) under the tongue every 5 (five) minutes x 3 doses as needed for chest pain., Disp: 25 tablet, Rfl: 2 .  omeprazole (PRILOSEC) 20 MG capsule, TAKE 2 CAPSULES BY MOUTH EVERY DAY 30 MINUTES BEFORE BREAKFAST, Disp: , Rfl:   Allergies  Allergen Reactions  . Other     Other reaction(s): Other (See Comments) SULFA-hallucinations  . Sulfa Antibiotics Other (See Comments)    hallucinations   Review of Systems Objective:  There were no vitals filed for this visit.  General: Well developed, nourished, in no acute distress, alert and oriented x3   Dermatological: Skin is warm, dry and supple bilateral. Nails x 10 are well maintained; remaining integument appears unremarkable at this time. There are no open sores, no preulcerative lesions, no rash or signs of infection present.  Severe pain on palpation of an old puncture wound on plantar aspect of the right foot.  There is no purulence no malodor just exquisite tenderness on palpation to that area.  Vascular: Dorsalis Pedis artery and Posterior Tibial artery pedal pulses are 2/4 bilateral with immedate capillary fill time. Pedal hair growth present. No varicosities and no lower extremity edema present bilateral.   Neruologic: Grossly intact via light touch bilateral. Vibratory intact via tuning fork bilateral. Protective threshold with Semmes Wienstein monofilament intact to all pedal  sites bilateral. Patellar and Achilles deep tendon reflexes 2+ bilateral. No Babinski or clonus noted bilateral.   Musculoskeletal: No gross boney pedal deformities bilateral. No pain, crepitus, or limitation noted with foot and ankle range of motion bilateral. Muscular strength 5/5 in all groups tested bilateral.  Gait: Unassisted, Nonantalgic.    Radiographs:  Radiographs taken today do not demonstrate any foreign body.  No acute findings.  Osteopenia is noted.  Assessment & Plan:   Assessment: Inclusion foreign body plantar aspect foot.  Plan: After local anesthetic was administered beneath this area I was able to clean it with Betadine and under sterile technique remove a portion of the skin until I can see the foreign body.  This did  not require any sutures but I was able to remove a piece of vegetation it appeared to be a small stick only about a millimeter in diameter but about 6 mm in length it seemed to come out in total I examined the area there was nothing else left rinsed it with alcohol and then place antibiotic ointment and a dry sterile dressing.  I will follow-up with her in a week or so to make sure she is doing well should she start to develop fever chills nausea vomiting any problems at all she is to notify us immediately.     Acey Woodfield T. Reynolds, [place]

## 2020-07-01 ENCOUNTER — Other Ambulatory Visit: Payer: Self-pay | Admitting: Podiatry

## 2020-07-01 DIAGNOSIS — S90851A Superficial foreign body, right foot, initial encounter: Secondary | ICD-10-CM

## 2020-07-03 ENCOUNTER — Encounter (HOSPITAL_BASED_OUTPATIENT_CLINIC_OR_DEPARTMENT_OTHER): Payer: Self-pay

## 2020-07-03 ENCOUNTER — Other Ambulatory Visit: Payer: Self-pay

## 2020-07-04 ENCOUNTER — Other Ambulatory Visit (HOSPITAL_COMMUNITY): Payer: Medicare Other

## 2020-07-05 ENCOUNTER — Other Ambulatory Visit (HOSPITAL_COMMUNITY)
Admission: RE | Admit: 2020-07-05 | Discharge: 2020-07-05 | Disposition: A | Discharge status: Home / Self Care | Payer: Medicare Other | Source: Ambulatory Visit | Attending: Orthopedic Surgery | Admitting: Orthopedic Surgery

## 2020-07-05 DIAGNOSIS — Z20822 Contact with and (suspected) exposure to covid-19: Secondary | ICD-10-CM | POA: Diagnosis not present

## 2020-07-05 DIAGNOSIS — Z01812 Encounter for preprocedural laboratory examination: Secondary | ICD-10-CM | POA: Insufficient documentation

## 2020-07-05 LAB — SARS CORONAVIRUS 2 (TAT 6-24 HRS): SARS Coronavirus 2: NEGATIVE

## 2020-07-07 ENCOUNTER — Encounter (HOSPITAL_COMMUNITY): Payer: Self-pay | Admitting: Certified Registered"

## 2020-07-08 ENCOUNTER — Encounter (HOSPITAL_COMMUNITY)
Admission: EM | Disposition: A | Discharge status: Home / Self Care | Payer: Self-pay | Source: Home / Self Care | Attending: Internal Medicine

## 2020-07-08 ENCOUNTER — Other Ambulatory Visit: Payer: Self-pay

## 2020-07-08 ENCOUNTER — Emergency Department (HOSPITAL_COMMUNITY): Payer: Medicare Other

## 2020-07-08 ENCOUNTER — Observation Stay (HOSPITAL_COMMUNITY): Payer: Medicare Other

## 2020-07-08 ENCOUNTER — Ambulatory Visit (HOSPITAL_BASED_OUTPATIENT_CLINIC_OR_DEPARTMENT_OTHER): Admission: RE | Admit: 2020-07-08 | Payer: Medicare Other | Source: Ambulatory Visit | Admitting: Orthopedic Surgery

## 2020-07-08 ENCOUNTER — Encounter (HOSPITAL_COMMUNITY): Payer: Self-pay

## 2020-07-08 ENCOUNTER — Inpatient Hospital Stay (HOSPITAL_COMMUNITY)
Admission: EM | Admit: 2020-07-08 | Discharge: 2020-07-12 | DRG: 690 | Disposition: A | Discharge status: Home / Self Care | Payer: Medicare Other | Attending: Internal Medicine | Admitting: Internal Medicine

## 2020-07-08 DIAGNOSIS — M67431 Ganglion, right wrist: Secondary | ICD-10-CM | POA: Diagnosis present

## 2020-07-08 DIAGNOSIS — F17210 Nicotine dependence, cigarettes, uncomplicated: Secondary | ICD-10-CM | POA: Diagnosis present

## 2020-07-08 DIAGNOSIS — N183 Chronic kidney disease, stage 3 unspecified: Secondary | ICD-10-CM | POA: Diagnosis present

## 2020-07-08 DIAGNOSIS — N200 Calculus of kidney: Secondary | ICD-10-CM | POA: Diagnosis not present

## 2020-07-08 DIAGNOSIS — A419 Sepsis, unspecified organism: Secondary | ICD-10-CM | POA: Diagnosis not present

## 2020-07-08 DIAGNOSIS — J45909 Unspecified asthma, uncomplicated: Secondary | ICD-10-CM | POA: Diagnosis present

## 2020-07-08 DIAGNOSIS — F419 Anxiety disorder, unspecified: Secondary | ICD-10-CM | POA: Diagnosis present

## 2020-07-08 DIAGNOSIS — Z6822 Body mass index (BMI) 22.0-22.9, adult: Secondary | ICD-10-CM

## 2020-07-08 DIAGNOSIS — R634 Abnormal weight loss: Secondary | ICD-10-CM | POA: Diagnosis present

## 2020-07-08 DIAGNOSIS — Z79899 Other long term (current) drug therapy: Secondary | ICD-10-CM

## 2020-07-08 DIAGNOSIS — B964 Proteus (mirabilis) (morganii) as the cause of diseases classified elsewhere: Secondary | ICD-10-CM | POA: Diagnosis present

## 2020-07-08 DIAGNOSIS — R911 Solitary pulmonary nodule: Secondary | ICD-10-CM | POA: Diagnosis present

## 2020-07-08 DIAGNOSIS — K219 Gastro-esophageal reflux disease without esophagitis: Secondary | ICD-10-CM | POA: Diagnosis present

## 2020-07-08 DIAGNOSIS — Z87442 Personal history of urinary calculi: Secondary | ICD-10-CM

## 2020-07-08 DIAGNOSIS — E876 Hypokalemia: Secondary | ICD-10-CM | POA: Diagnosis not present

## 2020-07-08 DIAGNOSIS — N10 Acute tubulo-interstitial nephritis: Secondary | ICD-10-CM | POA: Diagnosis not present

## 2020-07-08 DIAGNOSIS — Z7951 Long term (current) use of inhaled steroids: Secondary | ICD-10-CM

## 2020-07-08 DIAGNOSIS — Z20822 Contact with and (suspected) exposure to covid-19: Secondary | ICD-10-CM | POA: Diagnosis present

## 2020-07-08 DIAGNOSIS — Z882 Allergy status to sulfonamides status: Secondary | ICD-10-CM

## 2020-07-08 DIAGNOSIS — N1832 Chronic kidney disease, stage 3b: Secondary | ICD-10-CM | POA: Diagnosis present

## 2020-07-08 DIAGNOSIS — L400 Psoriasis vulgaris: Secondary | ICD-10-CM | POA: Diagnosis present

## 2020-07-08 HISTORY — DX: Unspecified asthma, uncomplicated: J45.909

## 2020-07-08 LAB — CBC
HCT: 40.6 % (ref 36.0–46.0)
Hemoglobin: 12.8 g/dL (ref 12.0–15.0)
MCH: 29 pg (ref 26.0–34.0)
MCHC: 31.5 g/dL (ref 30.0–36.0)
MCV: 91.9 fL (ref 80.0–100.0)
Platelets: 272 10*3/uL (ref 150–400)
RBC: 4.42 MIL/uL (ref 3.87–5.11)
RDW: 12.4 % (ref 11.5–15.5)
WBC: 12.1 10*3/uL — ABNORMAL HIGH (ref 4.0–10.5)
nRBC: 0 % (ref 0.0–0.2)

## 2020-07-08 LAB — URINALYSIS, ROUTINE W REFLEX MICROSCOPIC
Bilirubin Urine: NEGATIVE
Glucose, UA: NEGATIVE mg/dL
Ketones, ur: 5 mg/dL — AB
Nitrite: NEGATIVE
Protein, ur: 30 mg/dL — AB
Specific Gravity, Urine: 1.014 (ref 1.005–1.030)
WBC, UA: >50 WBC/hpf — ABNORMAL HIGH (ref 0–5)
pH: 7 (ref 5.0–8.0)

## 2020-07-08 LAB — HEPATIC FUNCTION PANEL
ALT: 8 U/L (ref 0–44)
AST: 17 U/L (ref 15–41)
Albumin: 2.4 g/dL — ABNORMAL LOW (ref 3.5–5.0)
Alkaline Phosphatase: 43 U/L (ref 38–126)
Bilirubin, Direct: 0.3 mg/dL — ABNORMAL HIGH (ref 0.0–0.2)
Indirect Bilirubin: 0.7 mg/dL (ref 0.3–0.9)
Total Bilirubin: 1 mg/dL (ref 0.3–1.2)
Total Protein: 5.3 g/dL — ABNORMAL LOW (ref 6.5–8.1)

## 2020-07-08 LAB — BASIC METABOLIC PANEL
Anion gap: 8 (ref 5–15)
BUN: 11 mg/dL (ref 8–23)
CO2: 15 mmol/L — ABNORMAL LOW (ref 22–32)
Calcium: 7 mg/dL — ABNORMAL LOW (ref 8.9–10.3)
Chloride: 115 mmol/L — ABNORMAL HIGH (ref 98–111)
Creatinine, Ser: 1.46 mg/dL — ABNORMAL HIGH (ref 0.44–1.00)
GFR, Estimated: 40 mL/min — ABNORMAL LOW (ref >60)
Glucose, Bld: 73 mg/dL (ref 70–99)
Potassium: 3.9 mmol/L (ref 3.5–5.1)
Sodium: 138 mmol/L (ref 135–145)

## 2020-07-08 LAB — LIPASE, BLOOD: Lipase: 29 U/L (ref 11–51)

## 2020-07-08 LAB — TROPONIN I (HIGH SENSITIVITY)
Troponin I (High Sensitivity): 8 ng/L (ref <18)
Troponin I (High Sensitivity): 3 ng/L (ref <18)

## 2020-07-08 SURGERY — EXCISION, GANGLION CYST, WRIST
Anesthesia: Regional | Site: Wrist | Laterality: Left

## 2020-07-08 MED ORDER — IOHEXOL 300 MG/ML  SOLN
100.0000 mL | Freq: Once | INTRAMUSCULAR | Status: AC | PRN
  Administered 2020-07-08: 80 mL via INTRAVENOUS

## 2020-07-08 MED ORDER — SODIUM CHLORIDE 0.9 % IV SOLN
1.0000 g | INTRAVENOUS | Status: DC
  Filled 2020-07-08: qty 10

## 2020-07-08 MED ORDER — ONDANSETRON HCL 4 MG/2ML IJ SOLN
4.0000 mg | Freq: Once | INTRAMUSCULAR | Status: AC
  Administered 2020-07-08: 4 mg via INTRAVENOUS
  Filled 2020-07-08: qty 2

## 2020-07-08 MED ORDER — MORPHINE SULFATE (PF) 4 MG/ML IV SOLN
4.0000 mg | Freq: Once | INTRAVENOUS | Status: AC
  Administered 2020-07-08: 4 mg via INTRAVENOUS
  Filled 2020-07-08: qty 1

## 2020-07-08 MED ORDER — ALPRAZOLAM 0.25 MG PO TABS: 0.2500 mg | ORAL_TABLET | Freq: Every evening | ORAL | Status: DC | PRN

## 2020-07-08 MED ORDER — MOMETASONE FURO-FORMOTEROL FUM 200-5 MCG/ACT IN AERO
2.0000 | INHALATION_SPRAY | Freq: Two times a day (BID) | RESPIRATORY_TRACT | Status: DC
  Administered 2020-07-09 – 2020-07-12 (×7): 2 via RESPIRATORY_TRACT
  Filled 2020-07-08: qty 8.8

## 2020-07-08 MED ORDER — OXYCODONE HCL 5 MG PO TABS
5.0000 mg | ORAL_TABLET | ORAL | Status: DC | PRN
  Administered 2020-07-09 – 2020-07-12 (×5): 5 mg via ORAL
  Filled 2020-07-08 (×5): qty 1

## 2020-07-08 MED ORDER — ONDANSETRON HCL 4 MG PO TABS: 4.0000 mg | ORAL_TABLET | Freq: Four times a day (QID) | ORAL | Status: DC | PRN

## 2020-07-08 MED ORDER — LACTATED RINGERS IV SOLN: INTRAVENOUS | Status: AC

## 2020-07-08 MED ORDER — ONDANSETRON HCL 4 MG/2ML IJ SOLN: 4.0000 mg | Freq: Four times a day (QID) | INTRAMUSCULAR | Status: DC | PRN

## 2020-07-08 MED ORDER — SODIUM CHLORIDE 0.9 % IV SOLN
1.0000 g | Freq: Once | INTRAVENOUS | Status: AC
  Administered 2020-07-08: 1 g via INTRAVENOUS
  Filled 2020-07-08: qty 10

## 2020-07-08 MED ORDER — HYDROMORPHONE HCL 1 MG/ML IJ SOLN
0.5000 mg | INTRAMUSCULAR | Status: DC | PRN
  Administered 2020-07-08 – 2020-07-09 (×3): 0.5 mg via INTRAVENOUS
  Filled 2020-07-08 (×4): qty 1

## 2020-07-08 MED ORDER — PANTOPRAZOLE SODIUM 40 MG PO TBEC
40.0000 mg | DELAYED_RELEASE_TABLET | Freq: Every day | ORAL | Status: DC
  Administered 2020-07-09 – 2020-07-12 (×4): 40 mg via ORAL
  Filled 2020-07-08 (×4): qty 1

## 2020-07-08 MED ORDER — ACETAMINOPHEN 325 MG PO TABS: 650.0000 mg | ORAL_TABLET | Freq: Four times a day (QID) | ORAL | Status: DC | PRN

## 2020-07-08 MED ORDER — LACTATED RINGERS IV BOLUS (SEPSIS)
1000.0000 mL | Freq: Once | INTRAVENOUS | Status: AC
  Administered 2020-07-08: 1000 mL via INTRAVENOUS

## 2020-07-08 MED ORDER — HYDROMORPHONE HCL 1 MG/ML IJ SOLN
0.5000 mg | Freq: Once | INTRAMUSCULAR | Status: AC
  Administered 2020-07-08: 0.5 mg via INTRAVENOUS
  Filled 2020-07-08: qty 1

## 2020-07-08 MED ORDER — SENNOSIDES-DOCUSATE SODIUM 8.6-50 MG PO TABS: 1.0000 | ORAL_TABLET | Freq: Every evening | ORAL | Status: DC | PRN

## 2020-07-08 MED ORDER — ENOXAPARIN SODIUM 30 MG/0.3ML ~~LOC~~ SOLN
30.0000 mg | SUBCUTANEOUS | Status: DC
  Administered 2020-07-08 – 2020-07-11 (×4): 30 mg via SUBCUTANEOUS
  Filled 2020-07-08 (×3): qty 0.3

## 2020-07-08 MED ORDER — ALBUTEROL SULFATE HFA 108 (90 BASE) MCG/ACT IN AERS
1.0000 | INHALATION_SPRAY | Freq: Four times a day (QID) | RESPIRATORY_TRACT | Status: DC | PRN
  Filled 2020-07-08: qty 6.7

## 2020-07-08 MED ORDER — ACETAMINOPHEN 650 MG RE SUPP: 650.0000 mg | Freq: Four times a day (QID) | RECTAL | Status: DC | PRN

## 2020-07-08 NOTE — H&P (Signed)
History and Physical    Whitney Parrish WYO:[id number] DOB: 21-Apr-1956 DOA: 07/08/2020  PCP: Dixie Dials, MD  Patient coming from: Home  I have personally briefly reviewed patient's old medical records in [place]  Chief Complaint: flank pain, fevers   HPI: Whitney Parrish is a 65 y.o. female with medical history significant for CKD stage III, anxiety, history of pyelonephritis and nephrolithiasis, tobacco use who presents to the ED for evaluation of flank pain.  Patient reports new onset of left-sided flank pain.  She has noticed dark discoloration of her urine and also has been having suprapubic pain, worse when she urinates.  She reports subjective fevers at home and had a near syncopal episode.  She has had nausea with vomiting and decreased oral intake/appetite.  She was planned to have surgery for excision of a volar ganglion cyst on her left wrist however her symptoms were too severe therefore procedure was canceled and she came to the ED for further evaluation.  She is a chronic smoker, currently 1 pack of cigarettes will last her about a week.  ED Course:  Initial vitals showed BP 139/63, pulse 104, RR 14, temp 98.7 F, SPO2 97% on room air.  Labs show WBC 12.1, hemoglobin 12.8, platelets 272,000, sodium 138, potassium 3.9, bicarb 15, BUN 11, creatinine 1.46, serum glucose 73, albumin 2.4, corrected calcium 8.3, AST 17, ALT 8, alk phos 43, total bilirubin 1.0, lipase 29.  High-sensitivity troponin I 8 > 3.  Urinalysis shows negative nitrates, large leukocytes, 0-5 RBC/hpf, >50 WBC/hpf, many bacteria microscopy.  Urine culture ordered and pending.  SARS-CoV-2 PCR ordered and pending.  2 view chest x-ray is negative for focal consolidation, edema, or effusion.  CT abdomen/pelvis with contrast shows bilateral pyelonephritis without abscess formation.  Nonobstructive 7 mm left and punctate right nephrolithiasis noted.  Possibly new 5 mm right lower lobe pulmonary nodule also  seen.  Patient was given IV Dilaudid, morphine, Zofran, and started on IV ceftriaxone.  The hospitalist service was consulted to admit for further evaluation and management.  Review of Systems: All systems reviewed and are negative except as documented in history of present illness above.   Past Medical History:  Diagnosis Date  . Anxiety   . Arthritis    shoulders and hands  . Asthma   . Benign positional vertigo   . Chronically dry eyes   . CKD (chronic kidney disease), stage III ([place])   . Depression   . Dyspnea on exertion   . Hematuria   . History of acute pyelonephritis   . History of Bell's palsy    1980's left side-- residual lip numbness intermittant  . History of chest pain    non-cardiac per dr Doylene Canard notes (pt's pcp)  . History of panic attacks   . Left ureteral stone   . Loose bowel movements    chronic-per pt residual from injury during abdominal surgery in 1986  . Migraines   . Nephrolithiasis    left   . Psoriasis   . Urgency of urination   . Wears dentures    has upper and lower but only wears upper    Past Surgical History:  Procedure Laterality Date  . BREAST CYST EXCISION  12/15/2011   Procedure: CYST EXCISION BREAST;  Surgeon: Harl Bowie, MD;  Location: [facility];  Service: General;  Laterality: Left;  Excision of a Chronic Left Breast Sebaceous Cyst  . CARDIOVASCULAR STRESS TEST  12-28-2012   Low risk  nuclear study w/ small LV cavity which may be due LVH/  no ischemia or infarct/  normal wall motion, ef 67%  . CYSTOSCOPY WITH STENT PLACEMENT Left 09/08/2015   Procedure: CYSTOSCOPY WITH LEFT STENT PLACEMENT;  Surgeon: Irine Seal, MD;  Location: [facility];  Service: Urology;  Laterality: Left;  . CYSTOSCOPY WITH URETEROSCOPY AND STENT PLACEMENT Left 09/25/2015   Procedure: LEFT  URETEROSCOPY STONE EXTRACTION;  Surgeon: Irine Seal, MD;  [facility];  Service: Urology;  Laterality: Left;  . EXCISION AND REVISION SCAR ABDOMINAL  WALL  01-22-2002  . EXPLORATORY LAPAROTOMY  1986   w/ Abdominal Myomectomy  (per pt bowel muscle cut (injury))  . HOLMIUM LASER APPLICATION Left 3/57/0177   Procedure: HOLMIUM LASER APPLICATION;  Surgeon: Irine Seal, MD;  Location: [facility];  Service: Urology;  Laterality: Left;  . I & D LEFT BUTTOCK ABSCESS   07-01-2010   x2 area's  . NEGATIVE SLEEP STUDY  08-10-2005  . TRANSTHORACIC ECHOCARDIOGRAM  12-28-2012   grade 1 diastolic dysfunction, mild concentric LVH, ef 65-70%/  mild MR/  trivial PR and TR    Social History:  reports that she has been smoking cigarettes. She has a 7.50 pack-year smoking history. She has never used smokeless tobacco. She reports that she does not drink alcohol and does not use drugs.  Allergies  Allergen Reactions  . Other     Other reaction(s): Other (See Comments) SULFA-hallucinations  . Sulfa Antibiotics Other (See Comments)    hallucinations    Family History  Problem Relation Age of Onset  . Cancer Mother        Stomach  . Cancer Father        Unknown     Prior to Admission medications   Medication Sig Start Date End Date Taking? Authorizing Provider  acetaminophen (TYLENOL) 500 MG tablet Take 1,000 mg by mouth every 6 (six) hours as needed for headache (pain).    [provider]  albuterol (PROVENTIL HFA;VENTOLIN HFA) 108 (90 Base) MCG/ACT inhaler Inhale 2 puffs into the lungs 3 (three) times daily.    [provider]  ALPRAZolam Duanne Moron) 0.25 MG tablet Take 0.25 mg by mouth at bedtime as needed for anxiety or sleep.     [provider]  betamethasone valerate ointment (VALISONE) 0.1 % Apply 1 application topically 2 (two) times daily. For plaque psoriasis - apply to affected area and wrap with saran wrap 03/15/18   [provider]  budesonide-formoterol (SYMBICORT) 160-4.5 MCG/ACT inhaler Inhale 2 puffs into the lungs 2 (two) times daily.    [provider]  cyclobenzaprine  (FLEXERIL) 10 MG tablet Take 1 tablet (10 mg total) by mouth 2 (two) times daily as needed for muscle spasms. 08/05/19   Faustino Congress, NP  cycloSPORINE (RESTASIS) 0.05 % ophthalmic emulsion Place 1 drop into both eyes daily.    [provider]  dicyclomine (BENTYL) 10 MG capsule Take 10 mg by mouth 2 (two) times daily as needed for spasms.     [provider]  gabapentin (NEURONTIN) 300 MG capsule Take 300 mg by mouth daily as needed (Nerve pain).  06/26/19   [provider]  Guselkumab 100 MG/ML SOPN Inject 100 mg into the skin every 30 (thirty) days.    [provider]  ibuprofen (ADVIL) 600 MG tablet Take 1 tablet (600 mg total) by mouth every 6 (six) hours as needed. 08/05/19   Faustino Congress, NP  meclizine (ANTIVERT) 25  MG tablet Take 25 mg by mouth 3 (three) times daily as needed for dizziness (Vertigo).     [provider]  metoCLOPramide (REGLAN) 5 MG tablet Take 5 mg by mouth 2 (two) times daily. 06/17/20   [provider]  nitroGLYCERIN (NITROSTAT) 0.4 MG SL tablet Place 1 tablet (0.4 mg total) under the tongue every 5 (five) minutes x 3 doses as needed for chest pain. 12/29/12   Dixie Dials, MD  omeprazole (PRILOSEC) 20 MG capsule TAKE 2 CAPSULES BY MOUTH EVERY DAY 30 MINUTES BEFORE BREAKFAST 06/07/20   [provider]    Physical Exam: Vitals:   07/08/20 1700 07/08/20 1800 07/08/20 1915 07/08/20 2000  BP: 123/74 136/80 121/76 135/74  Pulse: 86 88 88 91  Resp: _0 Temp:      SpO2: 99% 100% 100% 100%  Weight:      Height:       Constitutional: Resting in bed, NAD, calm, comfortable Eyes: PERRL, lids and conjunctivae normal ENMT: Mucous membranes are dry. Posterior pharynx clear of any exudate or lesions.Normal dentition.  Neck: normal, supple, no masses. Respiratory: clear to auscultation bilaterally, no wheezing, no crackles. Normal respiratory effort. No accessory muscle use.  Cardiovascular: Regular  rate and rhythm, no murmurs / rubs / gallops. No extremity edema. 2+ pedal pulses. Abdomen: Suprapubic tenderness, no masses palpated. No hepatosplenomegaly. Bowel sounds positive.  Musculoskeletal: no clubbing / cyanosis. No joint deformity upper and lower extremities. Good ROM, no contractures. Normal muscle tone.  Ganglion cyst left wrist. Skin: no rashes, lesions, ulcers. No induration Neurologic: CN 2-12 grossly intact. Sensation intact. Strength 5/5 in all 4.  Psychiatric: Normal judgment and insight. Alert and oriented x 3. Normal mood.   Labs on Admission: I have personally reviewed following labs and imaging studies  CBC: Recent Labs  Lab 07/08/20 1224  WBC 12.1*  HGB 12.8  HCT 40.6  MCV 91.9  PLT 470   Basic Metabolic Panel: Recent Labs  Lab 07/08/20 1549  NA 138  K 3.9  CL 115*  CO2 15*  GLUCOSE 73  BUN 11  CREATININE 1.46*  CALCIUM 7.0*   GFR: Estimated Creatinine Clearance: 29 mL/min (A) (by C-G formula based on SCr of 1.46 mg/dL (H)). Liver Function Tests: Recent Labs  Lab 07/08/20 1549  AST 17  ALT 8  ALKPHOS 43  BILITOT 1.0  PROT 5.3*  ALBUMIN 2.4*   Recent Labs  Lab 07/08/20 1549  LIPASE 29   No results for input(s): AMMONIA in the last 168 hours. Coagulation Profile: No results for input(s): INR, PROTIME in the last 168 hours. Cardiac Enzymes: No results for input(s): CKTOTAL, CKMB, CKMBINDEX, TROPONINI in the last 168 hours. BNP (last 3 results) No results for input(s): PROBNP in the last 8760 hours. HbA1C: No results for input(s): HGBA1C in the last 72 hours. CBG: No results for input(s): GLUCAP in the last 168 hours. Lipid Profile: No results for input(s): CHOL, HDL, LDLCALC, TRIG, CHOLHDL, LDLDIRECT in the last 72 hours. Thyroid Function Tests: No results for input(s): TSH, T4TOTAL, FREET4, T3FREE, THYROIDAB in the last 72 hours. Anemia Panel: No results for input(s): VITAMINB12, FOLATE, FERRITIN, TIBC, IRON, RETICCTPCT in the  last 72 hours. Urine analysis:    Component Value Date/Time   COLORURINE YELLOW 07/08/2020 1913   APPEARANCEUR CLOUDY (A) 07/08/2020 1913   LABSPEC 1.014 07/08/2020 1913   PHURINE 7.0 07/08/2020 1913   GLUCOSEU NEGATIVE 07/08/2020 1913   HGBUR MODERATE (A) 07/08/2020 1913  BILIRUBINUR NEGATIVE 07/08/2020 1913   KETONESUR 5 (A) 07/08/2020 1913   PROTEINUR 30 (A) 07/08/2020 1913   UROBILINOGEN 0.2 11/16/2011 0608   NITRITE NEGATIVE 07/08/2020 1913   LEUKOCYTESUR LARGE (A) 07/08/2020 1913    Radiological Exams on Admission: DG Chest 2 View  Result Date: 07/08/2020 CLINICAL DATA:  Chest pain EXAM: CHEST - 2 VIEW COMPARISON:  06/30/2018 FINDINGS: The heart size and mediastinal contours are within normal limits. Both lungs are clear. The visualized skeletal structures are unremarkable. IMPRESSION: No active cardiopulmonary disease. Electronically Signed   By: Davina Poke D.O.   On: 07/08/2020 12:53   CT ABDOMEN PELVIS W CONTRAST  Result Date: 07/08/2020 CLINICAL DATA:  Bilateral flank pain, cp,sob.  LLQ abdominal pain. EXAM: CT ABDOMEN AND PELVIS WITH CONTRAST TECHNIQUE: Multidetector CT imaging of the abdomen and pelvis was performed using the standard protocol following bolus administration of intravenous contrast. CONTRAST:  78m OMNIPAQUE IOHEXOL 300 MG/ML  SOLN COMPARISON:  CT abdomen pelvis 04/16/2018. CT urogram 10/27/2017, CT abdomen pelvis 09/08/2015 FINDINGS: Lower chest: Stable left lower lobe 5 mm micronodule. A 5 mm right lower lobe pulmonary nodule. Linear atelectasis versus scarring of the bases. Hepatobiliary: No focal liver abnormality. No gallstones, gallbladder wall thickening, or pericholecystic fluid. No biliary dilatation. Pancreas: No focal lesion. Normal pancreatic contour. No surrounding inflammatory changes. No main pancreatic ductal dilatation. Spleen: Normal in size without focal abnormality. Adrenals/Urinary Tract: No adrenal nodule bilaterally. Bilateral striated  nephrograms. Bilateral renal cortical scarring. A 1.5 cm fluid density lesion within the right kidney likely represents a simple renal cyst. There is a 7 mm calcified stone within the left kidney. Punctate calcification within the inferior pole of the right kidney. No hydronephrosis. No hydroureter. The urinary bladder is unremarkable. On delayed imaging, there is no urothelial wall thickening and there are no filling defects in the opacified portions of the bilateral collecting systems or ureters. Stomach/Bowel: Stomach is within normal limits. No evidence of bowel wall thickening or dilatation. Status post appendectomy. Vascular/Lymphatic: No abdominal aorta or iliac aneurysm. Mild calcified and noncalcified atherosclerotic plaque of the aorta and its branches. No abdominal, pelvic, or inguinal lymphadenopathy. Reproductive: Status post hysterectomy. No adnexal masses. Other: No intraperitoneal free fluid. No intraperitoneal free gas. No organized fluid collection. Musculoskeletal: No abdominal wall hernia or abnormality. No suspicious lytic or blastic osseous lesions. No acute displaced fracture. Multilevel degenerative changes of the spine. IMPRESSION: 1. Bilateral pyelonephritis with no abscess formation. 2. Nonobstructive 7 mm left and punctate right nephrolithiasis. 3. A possibly new 5 mm right lower lobe pulmonary nodule. No follow-up needed if patient is low-risk. Non-contrast chest CT can be considered in 12 months if patient is high-risk. This recommendation follows the consensus statement: Guidelines for Management of Incidental Pulmonary Nodules Detected on CT Images: From the Fleischner Society 2017; Radiology 2017; 284:228-243. 4.  Aortic Atherosclerosis (ICD10-I70.0). Electronically Signed   By: MIven FinnM.D.   On: 07/08/2020 19:16    EKG: Personally reviewed. Sinus tachycardia, rate 109.  Rate is faster when compared to prior.  Assessment/Plan Principal Problem:   Sepsis ([place]  Problems:   Acute pyelonephritis   CKD (chronic kidney disease) stage 3, GFR 30-59 ml/min ([facility])   Right lower lobe pulmonary nodule   Whitney FERRAIOLOis a 65y.o. female with medical history significant for CKD stage III, anxiety, history of pyelonephritis and nephrolithiasis, tobacco use who is admitted with sepsis due to acute bilateral pyelonephritis.  Sepsis due to acute bilateral pyelonephritis,  POA: Sepsis criteria met on admission with WBC >12.0, pulse >90, and pyelonephritis as infectious source. -Continue empiric IV ceftriaxone -Add on urine culture, obtain blood cultures -Give 1 L LR followed by maintenance fluids overnight -Continue antiemetics and analgesics as needed  Nonobstructive nephrolithiasis: CT abdomen/pelvis shows nonobstructive 7 mm left and punctate right nephrolithiasis.  Patient required previous left-sided stenting and stone extraction in May 2017 by urology, Dr. Jeffie Pollock.  Continue management as above.  Will obtain renal ultrasound to further assess for any obstructive stone.  CKD stage III: Chronic and appears stable based on previous labs.  Continue management as above and follow.  Asthma: Stable without evidence of wheezing on admission.  Continue Dulera and albuterol as needed.  Anxiety: Continue Xanax as needed.  Right lower lobe pulmonary nodule: Possibly new 5 mm right lower lobe pulmonary nodule seen on CT abdomen/pelvis.  Discussed findings with patient.  Given history of tobacco use, noncontrast chest CT scan should be considered in 12 months.  Tobacco use: Patient advised on smoking cessation.  DVT prophylaxis: Lovenox Code Status: Full code, confirmed with patient Family Communication: Discussed with patient, she has discussed with family Disposition Plan: From home and likely discharge 1-2 days to home pending improvement in sepsis physiology and symptoms. Consults called: None Level of care: Med-Surg Admission status:  Status is:  Observation  The patient remains OBS appropriate and will d/c before 2 midnights.  Dispo: The patient is from: Home              Anticipated d/c is to: Home              Patient currently is not medically stable to d/c.   Difficult to place patient No  Zada Finders MD [facility]  If 7PM-7AM, please contact night-coverage [email or web address]  07/08/2020, 8:45 PM

## 2020-07-08 NOTE — ED Provider Notes (Signed)
[name] EMERGENCY DEPARTMENT Provider Note   CSN: [id number] Arrival date & time: 07/08/20  1206     History Chief Complaint  Patient presents with  . Flank Pain  . Shortness of Breath    Whitney Parrish is a 65 y.o. female.  Patient with history of chronic kidney disease, previous fibroid surgery and hysterectomy, history of kidney stones --presents the emergency department today for evaluation of abdominal pain as well as syncope. Patient states that over the past week she has had worsening abdominal pain on the left side. It is worse with movement and palpation. Pain has become severe at times. She denies associated nausea, vomiting, diarrhea, constipation. Reports subjective fever to triage RN.  She states that she chronically has difficulty controlling her bowels after previous abdominal surgery. No urinary symptoms. She states that last night she took gabapentin, previously prescribed for neuropathy? (plaque psoriasis in her feet), for her pain. Today she had an episode of lightheadedness, described as graying of her vision bilaterally, and then passing out on the floor. This was witnessed by a neighbor. Patient then recovered. She states that she was in severe pain at the time this happened. No associated chest pain or shortness of breath to me (contradicting RN triage note). Patient denies signs of stroke including: facial droop, slurred speech, aphasia, weakness/numbness in extremities, imbalance/trouble walking. She states that she still has her gallbladder, appendix, no history of hernias. No other treatments prior to arrival.        Past Medical History:  Diagnosis Date  . Anxiety   . Arthritis    shoulders and hands  . Asthma   . Benign positional vertigo   . Chronically dry eyes   . CKD (chronic kidney disease), stage III (HCC)   . Depression   . Dyspnea on exertion   . Hematuria   . History of acute pyelonephritis   . History of Bell's palsy     1980's left side-- residual lip numbness intermittant  . History of chest pain    non-cardiac per dr Algie Coffer notes (pt's pcp)  . History of panic attacks   . Left ureteral stone   . Loose bowel movements    chronic-per pt residual from injury during abdominal surgery in 1986  . Migraines   . Nephrolithiasis    left   . Psoriasis   . Urgency of urination   . Wears dentures    has upper and lower but only wears upper    Patient Active Problem List   Diagnosis Date Noted  . Colon cancer screening 06/26/2020  . Dysphagia 06/26/2020  . Gastroesophageal reflux disease 06/26/2020  . Acute pyonephrosis 09/09/2015  . Chronic renal insufficiency 09/09/2015  . Acute renal insufficiency 09/09/2015  . Obstructive uropathy 09/08/2015  . Left nephrolithiasis 09/08/2015  . Chest pain at rest 11/15/2011  . Dizziness 11/15/2011  . Breast abscess 10/06/2011    Past Surgical History:  Procedure Laterality Date  . BREAST CYST EXCISION  12/15/2011   Procedure: CYST EXCISION BREAST;  Surgeon: Shelly Rubenstein, MD;  Location: [facility];  Service: General;  Laterality: Left;  Excision of a Chronic Left Breast Sebaceous Cyst  . CARDIOVASCULAR STRESS TEST  12-28-2012   Low risk nuclear study w/ small LV cavity which may be due LVH/  no ischemia or infarct/  normal wall motion, ef 67%  . CYSTOSCOPY WITH STENT PLACEMENT Left 09/08/2015   Procedure: CYSTOSCOPY WITH LEFT STENT PLACEMENT;  Surgeon: Bjorn Pippin,  MD;  Location: [facility];  Service: Urology;  Laterality: Left;  . CYSTOSCOPY WITH URETEROSCOPY AND STENT PLACEMENT Left 09/25/2015   Procedure: LEFT  URETEROSCOPY STONE EXTRACTION;  Surgeon: Bjorn Pippin, MD;  Location: [facility];  Service: Urology;  Laterality: Left;  . EXCISION AND REVISION SCAR ABDOMINAL WALL  01-22-2002  . EXPLORATORY LAPAROTOMY  1986   w/ Abdominal Myomectomy  (per pt bowel muscle cut (injury))  . HOLMIUM LASER APPLICATION Left 09/25/2015   Procedure: HOLMIUM LASER  APPLICATION;  Surgeon: Bjorn Pippin, MD;  Location: [facility];  Service: Urology;  Laterality: Left;  . I & D LEFT BUTTOCK ABSCESS   07-01-2010   x2 area's  . NEGATIVE SLEEP STUDY  08-10-2005  . TRANSTHORACIC ECHOCARDIOGRAM  12-28-2012   grade 1 diastolic dysfunction, mild concentric LVH, ef 65-70%/  mild MR/  trivial PR and TR     OB History   No obstetric history on file.     Family History  Problem Relation Age of Onset  . Cancer Mother        Stomach  . Cancer Father        Unknown    Social History   Tobacco Use  . Smoking status: Current Every Day Smoker    Packs/day: 0.25    Years: 30.00    Pack years: 7.50    Types: Cigarettes  . Smokeless tobacco: Never Used  . Tobacco comment: " QUITTING ON HER OWN "  down to 5 cig daily  Substance Use Topics  . Alcohol use: No  . Drug use: No    Home Medications Prior to Admission medications   Medication Sig Start Date End Date Taking? Authorizing Provider  acetaminophen (TYLENOL) 500 MG tablet Take 1,000 mg by mouth every 6 (six) hours as needed for headache (pain).    [provider]  albuterol (PROVENTIL HFA;VENTOLIN HFA) 108 (90 Base) MCG/ACT inhaler Inhale 2 puffs into the lungs 3 (three) times daily.    [provider]  ALPRAZolam Prudy Feeler) 0.25 MG tablet Take 0.25 mg by mouth at bedtime as needed for anxiety or sleep.     [provider]  betamethasone valerate ointment (VALISONE) 0.1 % Apply 1 application topically 2 (two) times daily. For plaque psoriasis - apply to affected area and wrap with saran wrap 03/15/18   [provider]  budesonide-formoterol (SYMBICORT) 160-4.5 MCG/ACT inhaler Inhale 2 puffs into the lungs 2 (two) times daily.    [provider]  cyclobenzaprine (FLEXERIL) 10 MG tablet Take 1 tablet (10 mg total) by mouth 2 (two) times daily as needed for muscle spasms. 08/05/19   Moshe Cipro, NP  cycloSPORINE (RESTASIS) 0.05 % ophthalmic  emulsion Place 1 drop into both eyes daily.    [provider]  dicyclomine (BENTYL) 10 MG capsule Take 10 mg by mouth 2 (two) times daily as needed for spasms.     [provider]  gabapentin (NEURONTIN) 300 MG capsule Take 300 mg by mouth daily as needed (Nerve pain).  06/26/19   [provider]  Guselkumab 100 MG/ML SOPN Inject 100 mg into the skin every 30 (thirty) days.    [provider]  ibuprofen (ADVIL) 600 MG tablet Take 1 tablet (600 mg total) by mouth every 6 (six) hours as needed. 08/05/19   Moshe Cipro, NP  meclizine (ANTIVERT) 25 MG tablet Take 25 mg by mouth 3 (three) times daily as needed for dizziness (Vertigo).     [provider]  metoCLOPramide (REGLAN) 5 MG tablet Take 5 mg by mouth 2 (two) times daily. 06/17/20   [provider]  nitroGLYCERIN (NITROSTAT) 0.4 MG SL tablet Place 1 tablet (0.4 mg total) under the tongue every 5 (five) minutes x 3 doses as needed for chest pain. 12/29/12   [name], MD  omeprazole (PRILOSEC) 20 MG capsule TAKE 2 CAPSULES BY MOUTH EVERY DAY 30 MINUTES BEFORE BREAKFAST 06/07/20   [provider]    Allergies    Other and Sulfa antibiotics  Review of Systems   Review of Systems  Constitutional: Negative for chills and fever.  HENT: Negative for rhinorrhea and sore throat.   Eyes: Positive for visual disturbance. Negative for redness.  Respiratory: Negative for cough.   Cardiovascular: Negative for chest pain.  Gastrointestinal: Positive for abdominal pain. Negative for diarrhea, nausea and vomiting.  Genitourinary: Positive for flank pain. Negative for dysuria, frequency, hematuria, urgency, vaginal bleeding and vaginal discharge.  Musculoskeletal: Negative for myalgias.  Skin: Negative for rash.  Neurological: Positive for syncope and light-headedness. Negative for headaches.    Physical Exam Updated Vital Signs BP 139/63 (BP Location: Left Arm)   Pulse (!) 104    Temp 98.7 F (37.1 C)   Resp 14   SpO2 97%   Physical Exam Vitals and nursing note reviewed.  Constitutional:      General: She is not in acute distress.    Appearance: She is well-developed.  HENT:     Head: Normocephalic and atraumatic.     Right Ear: External ear normal.     Left Ear: External ear normal.     Nose: Nose normal.  Eyes:     Conjunctiva/sclera: Conjunctivae normal.  Cardiovascular:     Rate and Rhythm: Normal rate and regular rhythm.     Heart sounds: No murmur heard.   Pulmonary:     Effort: No respiratory distress.     Breath sounds: No wheezing, rhonchi or rales.  Abdominal:     Palpations: Abdomen is soft.     Tenderness: There is no abdominal tenderness. There is no guarding or rebound.     Comments: Patient has pain generally to palpation over the entire abdomen, however seems more severe with voluntary guarding with palpation over the entire left abdomen. Patient winces with palpation anywhere.  Musculoskeletal:     Cervical back: Normal range of motion and neck supple.     Right lower leg: No edema.     Left lower leg: No edema.  Skin:    General: Skin is warm and dry.     Findings: No rash.  Neurological:     General: No focal deficit present.     Mental Status: She is alert. Mental status is at baseline.     Motor: No weakness.  Psychiatric:        Mood and Affect: Mood normal.     ED Results / Procedures / Treatments   Labs (all labs ordered are listed, but only abnormal results are displayed) Labs Reviewed  CBC - Abnormal; Notable for the following components:      Result Value   WBC 12.1 (*)    All other components within normal limits  URINALYSIS, ROUTINE W REFLEX MICROSCOPIC  BASIC METABOLIC PANEL  LIPASE, BLOOD  HEPATIC FUNCTION PANEL  TROPONIN I (HIGH SENSITIVITY)  TROPONIN I (HIGH SENSITIVITY)    ED ECG REPORT   Date: 07/08/2020  Rate: 109  Rhythm: sinus tachycardia  QRS Axis:  normal  Intervals: normal  ST/T Wave  abnormalities: normal  Conduction Disutrbances:none  Narrative Interpretation:   Old EKG Reviewed: changed from 08/19/19, faster today, poorer baseline  I have personally reviewed the EKG tracing and agree with the computerized printout as noted.  Radiology DG Chest 2 View  Result Date: 07/08/2020 CLINICAL DATA:  Chest pain EXAM: CHEST - 2 VIEW COMPARISON:  06/30/2018 FINDINGS: The heart size and mediastinal contours are within normal limits. Both lungs are clear. The visualized skeletal structures are unremarkable. IMPRESSION: No active cardiopulmonary disease. Electronically Signed   By: Duanne GuessNicholas  Plundo D.O.   On: 07/08/2020 12:53    Procedures Procedures   Medications Ordered in ED Medications  HYDROmorphone (DILAUDID) injection 0.5 mg (0.5 mg Intravenous Given 07/08/20 1420)  ondansetron (ZOFRAN) injection 4 mg (4 mg Intravenous Given 07/08/20 1419)    ED Course  I have reviewed the triage vital signs and the nursing notes.  Pertinent labs & imaging results that were available during my care of the patient were reviewed by me and considered in my medical decision making (see chart for details).  Patient seen and examined. Work-up initiated. Medications ordered.   Vital signs reviewed and are as follows: BP 124/70 (BP Location: Left Arm)   Pulse 88   Temp 98.7 F (37.1 C)   Resp 17   Ht 5\' 1"  (1.549 m)   Wt 54 kg   SpO2 98%   BMI 22.48 kg/m   3:58 PM Signout to [employer] PA-C at shift change.   Plan: CT abdomen and pelvis, follow-up on labs, reassessment.  Discharge based on patient condition and work-up.  Syncope likely secondary to pain.  EKG without prolonged QT, WPW, Brugada syndrome, heart block or arrhythmia.    MDM Rules/Calculators/A&P                          Pending completion of work-up.   Final Clinical Impression(s) / ED Diagnoses Final diagnoses:  None    Rx / DC Orders ED Discharge Orders    None       Renne CriglerGeiple, Ulyana Pitones, PA-C 07/08/20 1559     Milagros Lollykstra, Richard S, MD 07/09/20 [phone]

## 2020-07-08 NOTE — ED Provider Notes (Signed)
Received patient as a handoff at shift change from [facility], [place].  Please see his note for full HPI.  In short, patient is a 65 year old female with PMH of fibroid surgeries and hysterectomy, CKD, and obstructive kidney stones who presented to the ED with complaints of severe left-sided abdominal pain.  Patient stated that she had a near syncopal episode which she attributed to her severe pain symptoms.  Denied any chest pain or shortness of breath, neuro deficits, or other symptoms.  At time of handoff, laboratory work-up and CT abdomen pelvis with contrast has been ordered.  Physical Exam  BP 121/76    Pulse 88    Temp 98.7 F (37.1 C)    Resp 17    Ht 5\' 1"  (1.549 m)    Wt 54 kg    SpO2 100%    BMI 22.48 kg/m   Physical Exam  ED Course/Procedures     Procedures Results for orders placed or performed during the hospital encounter of 07/08/20  Urinalysis, Routine w reflex microscopic Urine, Clean Catch  Result Value Ref Range   Color, Urine YELLOW YELLOW   APPearance CLOUDY (A) CLEAR   Specific Gravity, Urine 1.014 1.005 - 1.030   pH 7.0 5.0 - 8.0   Glucose, UA NEGATIVE NEGATIVE mg/dL   Hgb urine dipstick MODERATE (A) NEGATIVE   Bilirubin Urine NEGATIVE NEGATIVE   Ketones, ur 5 (A) NEGATIVE mg/dL   Protein, ur 30 (A) NEGATIVE mg/dL   Nitrite NEGATIVE NEGATIVE   Leukocytes,Ua LARGE (A) NEGATIVE   RBC / HPF 0-5 0 - 5 RBC/hpf   WBC, UA >50 (H) 0 - 5 WBC/hpf   Bacteria, UA MANY (A) NONE SEEN   Squamous Epithelial / LPF 0-5 0 - 5   WBC Clumps PRESENT    Mucus PRESENT   CBC  Result Value Ref Range   WBC 12.1 (H) 4.0 - 10.5 K/uL   RBC 4.42 3.87 - 5.11 MIL/uL   Hemoglobin 12.8 12.0 - 15.0 g/dL   HCT 09/07/20 37.6 - 28.3 %   MCV 91.9 80.0 - 100.0 fL   MCH 29.0 26.0 - 34.0 pg   MCHC 31.5 30.0 - 36.0 g/dL   RDW 15.1 76.1 - 60.7 %   Platelets 272 150 - 400 K/uL   nRBC 0.0 0.0 - 0.2 %  Basic metabolic panel  Result Value Ref Range   Sodium 138 135 - 145 mmol/L   Potassium  3.9 3.5 - 5.1 mmol/L   Chloride 115 (H) 98 - 111 mmol/L   CO2 15 (L) 22 - 32 mmol/L   Glucose, Bld 73 70 - 99 mg/dL   BUN 11 8 - 23 mg/dL   Creatinine, Ser 37.1 (H) 0.44 - 1.00 mg/dL   Calcium 7.0 (L) 8.9 - 10.3 mg/dL   GFR, Estimated 40 (L) >60 mL/min   Anion gap 8 5 - 15  Lipase, blood  Result Value Ref Range   Lipase 29 11 - 51 U/L  Hepatic function panel  Result Value Ref Range   Total Protein 5.3 (L) 6.5 - 8.1 g/dL   Albumin 2.4 (L) 3.5 - 5.0 g/dL   AST 17 15 - 41 U/L   ALT 8 0 - 44 U/L   Alkaline Phosphatase 43 38 - 126 U/L   Total Bilirubin 1.0 0.3 - 1.2 mg/dL   Bilirubin, Direct 0.3 (H) 0.0 - 0.2 mg/dL   Indirect Bilirubin 0.7 0.3 - 0.9 mg/dL  Troponin I (High Sensitivity)  Result  Value Ref Range   Troponin I (High Sensitivity) 8 <18 ng/L  Troponin I (High Sensitivity)  Result Value Ref Range   Troponin I (High Sensitivity) 3 <18 ng/L   DG Chest 2 View  Result Date: 07/08/2020 CLINICAL DATA:  Chest pain EXAM: CHEST - 2 VIEW COMPARISON:  06/30/2018 FINDINGS: The heart size and mediastinal contours are within normal limits. Both lungs are clear. The visualized skeletal structures are unremarkable. IMPRESSION: No active cardiopulmonary disease. Electronically Signed   By: Duanne Guess D.O.   On: 07/08/2020 12:53   CT ABDOMEN PELVIS W CONTRAST  Result Date: 07/08/2020 CLINICAL DATA:  Bilateral flank pain, cp,sob.  LLQ abdominal pain. EXAM: CT ABDOMEN AND PELVIS WITH CONTRAST TECHNIQUE: Multidetector CT imaging of the abdomen and pelvis was performed using the standard protocol following bolus administration of intravenous contrast. CONTRAST:  75mL OMNIPAQUE IOHEXOL 300 MG/ML  SOLN COMPARISON:  CT abdomen pelvis 04/16/2018. CT urogram 10/27/2017, CT abdomen pelvis 09/08/2015 FINDINGS: Lower chest: Stable left lower lobe 5 mm micronodule. A 5 mm right lower lobe pulmonary nodule. Linear atelectasis versus scarring of the bases. Hepatobiliary: No focal liver abnormality. No  gallstones, gallbladder wall thickening, or pericholecystic fluid. No biliary dilatation. Pancreas: No focal lesion. Normal pancreatic contour. No surrounding inflammatory changes. No main pancreatic ductal dilatation. Spleen: Normal in size without focal abnormality. Adrenals/Urinary Tract: No adrenal nodule bilaterally. Bilateral striated nephrograms. Bilateral renal cortical scarring. A 1.5 cm fluid density lesion within the right kidney likely represents a simple renal cyst. There is a 7 mm calcified stone within the left kidney. Punctate calcification within the inferior pole of the right kidney. No hydronephrosis. No hydroureter. The urinary bladder is unremarkable. On delayed imaging, there is no urothelial wall thickening and there are no filling defects in the opacified portions of the bilateral collecting systems or ureters. Stomach/Bowel: Stomach is within normal limits. No evidence of bowel wall thickening or dilatation. Status post appendectomy. Vascular/Lymphatic: No abdominal aorta or iliac aneurysm. Mild calcified and noncalcified atherosclerotic plaque of the aorta and its branches. No abdominal, pelvic, or inguinal lymphadenopathy. Reproductive: Status post hysterectomy. No adnexal masses. Other: No intraperitoneal free fluid. No intraperitoneal free gas. No organized fluid collection. Musculoskeletal: No abdominal wall hernia or abnormality. No suspicious lytic or blastic osseous lesions. No acute displaced fracture. Multilevel degenerative changes of the spine. IMPRESSION: 1. Bilateral pyelonephritis with no abscess formation. 2. Nonobstructive 7 mm left and punctate right nephrolithiasis. 3. A possibly new 5 mm right lower lobe pulmonary nodule. No follow-up needed if patient is low-risk. Non-contrast chest CT can be considered in 12 months if patient is high-risk. This recommendation follows the consensus statement: Guidelines for Management of Incidental Pulmonary Nodules Detected on CT  Images: From the Fleischner Society 2017; Radiology 2017; [id number]. 4.  Aortic Atherosclerosis (ICD10-I70.0). Electronically Signed   By: Tish Frederickson M.D.   On: 07/08/2020 19:16   DG Foot Complete Right  Result Date: 07/01/2020 Please see detailed radiograph report in office note.   MDM   On my examination, patient states that she was supposed to go see Dr. Merlyn Lot this morning to have a ganglion cyst removed from her left wrist, but her severe flank and abdominal pain prompted her to come to the ED instead.  She also states that she had a syncopal episode due to the extraordinary pain.  Dilaudid was administered here in the ED which she states improved her symptoms a little bit, but she is continue to endorse ongoing abdominal and  flank pain as well as nausea.  She states that she has not had any to eat or drink due to her nausea symptoms.  She does endorse dysuria and increased urinary frequency for the past few days.  She has a history of pyelonephritis and states this feels similar.  She is followed by Dr. Algie Coffer as her primary care provider.  CT abdomen pelvis obtained demonstrated bilateral pyelonephritis, without abscess formation or other complication.  However, there is also a new 5 mm right lower lobe pulmonary nodule.  She tells me that she has been smoking cigarettes for approximately 50 years.  She also states that she also endorses a 20 pound weight loss in the past few months.  She is asking for further pain medication.  Will administer 4 mg morphine IV and give Zofran 4 mg IV.  Plan for hospital admission given her bilateral pyelonephritis and intractable pain symptoms.  There is also a new pulmonary nodule that will warrant further work-up, non emergent basis.    Given her symptoms, bilateral pyelonephritis, ongoing pain and nausea, and significant UTI, feel as though she would benefit from hospitalist admission for IV antibiotics, pain control, and urine culture.  Discussed  with Dr. Rodena Medin who agrees.      Lorelee New, PA-C 07/08/20 2014    Wynetta Fines, MD 07/11/20 1020

## 2020-07-08 NOTE — ED Triage Notes (Addendum)
Pt reports she is here today due to multiple complaints.Pt reports she is having flank pain bilateral. Pt also reports cp,sob. Pt states that she also having fevers at home. Pt also reports it feels like she has fibroid tumors.

## 2020-07-08 NOTE — ED Notes (Signed)
Pt to Ultrasound

## 2020-07-09 DIAGNOSIS — Z87442 Personal history of urinary calculi: Secondary | ICD-10-CM | POA: Diagnosis not present

## 2020-07-09 DIAGNOSIS — E876 Hypokalemia: Secondary | ICD-10-CM | POA: Diagnosis not present

## 2020-07-09 DIAGNOSIS — N10 Acute tubulo-interstitial nephritis: Secondary | ICD-10-CM | POA: Diagnosis present

## 2020-07-09 DIAGNOSIS — N1832 Chronic kidney disease, stage 3b: Secondary | ICD-10-CM | POA: Diagnosis present

## 2020-07-09 DIAGNOSIS — Z6822 Body mass index (BMI) 22.0-22.9, adult: Secondary | ICD-10-CM | POA: Diagnosis not present

## 2020-07-09 DIAGNOSIS — K219 Gastro-esophageal reflux disease without esophagitis: Secondary | ICD-10-CM | POA: Diagnosis present

## 2020-07-09 DIAGNOSIS — F419 Anxiety disorder, unspecified: Secondary | ICD-10-CM | POA: Diagnosis present

## 2020-07-09 DIAGNOSIS — N200 Calculus of kidney: Secondary | ICD-10-CM | POA: Diagnosis present

## 2020-07-09 DIAGNOSIS — B964 Proteus (mirabilis) (morganii) as the cause of diseases classified elsewhere: Secondary | ICD-10-CM | POA: Diagnosis present

## 2020-07-09 DIAGNOSIS — F17210 Nicotine dependence, cigarettes, uncomplicated: Secondary | ICD-10-CM | POA: Diagnosis present

## 2020-07-09 DIAGNOSIS — J45909 Unspecified asthma, uncomplicated: Secondary | ICD-10-CM | POA: Diagnosis present

## 2020-07-09 DIAGNOSIS — R634 Abnormal weight loss: Secondary | ICD-10-CM | POA: Diagnosis present

## 2020-07-09 DIAGNOSIS — M67431 Ganglion, right wrist: Secondary | ICD-10-CM | POA: Diagnosis present

## 2020-07-09 DIAGNOSIS — R911 Solitary pulmonary nodule: Secondary | ICD-10-CM

## 2020-07-09 DIAGNOSIS — Z7951 Long term (current) use of inhaled steroids: Secondary | ICD-10-CM | POA: Diagnosis not present

## 2020-07-09 DIAGNOSIS — Z79899 Other long term (current) drug therapy: Secondary | ICD-10-CM | POA: Diagnosis not present

## 2020-07-09 DIAGNOSIS — L400 Psoriasis vulgaris: Secondary | ICD-10-CM | POA: Diagnosis present

## 2020-07-09 DIAGNOSIS — Z20822 Contact with and (suspected) exposure to covid-19: Secondary | ICD-10-CM | POA: Diagnosis present

## 2020-07-09 DIAGNOSIS — Z882 Allergy status to sulfonamides status: Secondary | ICD-10-CM | POA: Diagnosis not present

## 2020-07-09 LAB — BASIC METABOLIC PANEL
Anion gap: 12 (ref 5–15)
BUN: 12 mg/dL (ref 8–23)
CO2: 18 mmol/L — ABNORMAL LOW (ref 22–32)
Calcium: 9 mg/dL (ref 8.9–10.3)
Chloride: 105 mmol/L (ref 98–111)
Creatinine, Ser: 1.86 mg/dL — ABNORMAL HIGH (ref 0.44–1.00)
GFR, Estimated: 30 mL/min — ABNORMAL LOW (ref >60)
Glucose, Bld: 132 mg/dL — ABNORMAL HIGH (ref 70–99)
Potassium: 3.4 mmol/L — ABNORMAL LOW (ref 3.5–5.1)
Sodium: 135 mmol/L (ref 135–145)

## 2020-07-09 LAB — CBC
HCT: 33.5 % — ABNORMAL LOW (ref 36.0–46.0)
Hemoglobin: 10.9 g/dL — ABNORMAL LOW (ref 12.0–15.0)
MCH: 28.5 pg (ref 26.0–34.0)
MCHC: 32.5 g/dL (ref 30.0–36.0)
MCV: 87.5 fL (ref 80.0–100.0)
Platelets: 252 10*3/uL (ref 150–400)
RBC: 3.83 MIL/uL — ABNORMAL LOW (ref 3.87–5.11)
RDW: 12 % (ref 11.5–15.5)
WBC: 10 10*3/uL (ref 4.0–10.5)
nRBC: 0 % (ref 0.0–0.2)

## 2020-07-09 LAB — LACTIC ACID, PLASMA
Lactic Acid, Venous: 2.1 mmol/L (ref 0.5–1.9)
Lactic Acid, Venous: 1.3 mmol/L (ref 0.5–1.9)

## 2020-07-09 LAB — PROCALCITONIN: Procalcitonin: 0.27 ng/mL

## 2020-07-09 LAB — HIV ANTIBODY (ROUTINE TESTING W REFLEX): HIV Screen 4th Generation wRfx: NONREACTIVE

## 2020-07-09 LAB — SARS CORONAVIRUS 2 (TAT 6-24 HRS): SARS Coronavirus 2: NEGATIVE

## 2020-07-09 MED ORDER — SODIUM CHLORIDE 0.9 % IV SOLN
2.0000 g | INTRAVENOUS | Status: DC
  Administered 2020-07-09 – 2020-07-10 (×2): 2 g via INTRAVENOUS
  Filled 2020-07-09 (×2): qty 2
  Filled 2020-07-09: qty 20

## 2020-07-09 MED ORDER — POTASSIUM CHLORIDE CRYS ER 20 MEQ PO TBCR
40.0000 meq | EXTENDED_RELEASE_TABLET | Freq: Once | ORAL | Status: AC
  Administered 2020-07-09: 40 meq via ORAL
  Filled 2020-07-09: qty 2

## 2020-07-09 NOTE — Progress Notes (Signed)
PROGRESS NOTE        PATIENT DETAILS Name: Whitney Parrish Age: 65 y.o. Sex: female Date of Birth: [birth year] Admit Date: 07/08/2020 Admitting Physician Charlsie Quest, MD GEX:[id number], Viviano Simas, MD  Brief Narrative: Patient is a 65 y.o. female CKD stage IIIb, anxiety, tobacco abuse-who presented with left flank pain, frequency of urination and dark-colored urine-she was found to have acute pyelonephritis and admitted to the hospitalist service.  See below for further details.  Significant events: 3/8>> admit for evaluation/treatment of pyelonephritis  Significant studies: 3/8>> chest x-ray: No active cardiopulmonary disease. 3/8>> CT abdomen/pelvis: Bilateral pyelonephritis, nonobstructive 7 mm right nephrolithiasis 3/8>> renal ultrasound: No hydronephrosis  Antimicrobial therapy: Rocephin: 3/8>>  Microbiology data: 3/8>> urine culture: Pending 3/9>> blood culture: No growth  Procedures : None  Consults: None  DVT Prophylaxis : enoxaparin (LOVENOX) injection 30 mg Start: 07/08/20 2115  Subjective: Continues to significant pain in the left flank area.  Assessment/Plan: Acute pyelonephritis: Continues to have severe left flank pain-continue IV Rocephin-given severity of pain.  Has received 2 doses of IV Dilaudid and several doses of oral narcotics overnight.  No evidence of abscess on CT/renal ultrasound-hopefully pain will improve with IV antibiotics-otherwise may require repeat imaging in the next few days to rule out abscess.  Do not think patient had sepsis on admission-sepsis physiology has been ruled out.  CKD stage IIIb: Some jump in creatinine compared to yesterday but still not that far from her prior baseline-watch closely as patient received contrast on admission.  Repeat electrolytes tomorrow.  Anxiety: Stable-continue as needed Xanax  History of asthma: Stable-continue bronchodilators  GERD: Continue PPI  Pulmonary nodule: Repeat  noncontrast CT in 12 months as recommended by radiology-she has a history of tobacco use.  Seen incidentally on CT abdomen/pelvis.   Diet: Diet Order            Diet regular Room service appropriate? Yes; Fluid consistency: Thin  Diet effective now                  Code Status: Full code   Family Communication: I will update family over the next few days.  Disposition Plan: Status is: Observation  The patient will require care spanning > 2 midnights and should be moved to inpatient because: Inpatient level of care appropriate due to severity of illness  Dispo: The patient is from: Home              Anticipated d/c is to: Home              Patient currently is not medically stable to d/c.   Difficult to place patient No     Barriers to Discharge: Pyelonephritis-with severe left flank pain requiring IV Dilaudid-awaiting culture data-remains on IV antibiotics.  Antimicrobial agents: Anti-infectives (From admission, onward)   Start     Dose/Rate Route Frequency Ordered Stop   07/09/20 2030  cefTRIAXone (ROCEPHIN) 1 g in sodium chloride 0.9 % 100 mL IVPB        1 g 200 mL/hr over 30 Minutes Intravenous Every 24 hours 07/08/20 2109     07/08/20 2015  cefTRIAXone (ROCEPHIN) 1 g in sodium chloride 0.9 % 100 mL IVPB        1 g 200 mL/hr over 30 Minutes Intravenous  Once 07/08/20 2013 07/08/20 2102  Time spent: 25- minutes-Greater than 50% of this time was spent in counseling, explanation of diagnosis, planning of further management, and coordination of care.  MEDICATIONS: Scheduled Meds: . enoxaparin (LOVENOX) injection  30 mg Subcutaneous Q24H  . mometasone-formoterol  2 puff Inhalation BID  . pantoprazole  40 mg Oral QAC breakfast   Continuous Infusions: . cefTRIAXone (ROCEPHIN)  IV     PRN Meds:.acetaminophen **OR** acetaminophen, albuterol, ALPRAZolam, HYDROmorphone (DILAUDID) injection, ondansetron **OR** ondansetron (ZOFRAN) IV, oxyCODONE,  senna-docusate   PHYSICAL EXAM: Vital signs: Vitals:   07/08/20 2243 07/08/20 2339 07/09/20 0528 07/09/20 0855  BP: 139/79 (!) 130/92 133/75   Pulse: 83 92 87 88  Resp: 19 17 18 16   Temp:  98.7 F (37.1 C) 99.5 F (37.5 C)   TempSrc:  Oral Oral   SpO2: 97% 100% 97%   Weight:      Height:       Filed Weights   07/08/20 1446  Weight: 54 kg   Body mass index is 22.48 kg/m.   Gen Exam:Alert awake-not in any distress HEENT:atraumatic, normocephalic Chest: B/L clear to auscultation anteriorly CVS:S1S2 regular Abdomen:soft non tender, non distended.  Left CVA tenderness+++ Extremities:no edema Neurology: Non focal Skin: no rash  I have personally reviewed following labs and imaging studies  LABORATORY DATA: CBC: Recent Labs  Lab 07/08/20 1224 07/09/20 0042  WBC 12.1* 10.0  HGB 12.8 10.9*  HCT 40.6 33.5*  MCV 91.9 87.5  PLT 272 252    Basic Metabolic Panel: Recent Labs  Lab 07/08/20 1549 07/09/20 0042  NA 138 135  K 3.9 3.4*  CL 115* 105  CO2 15* 18*  GLUCOSE 73 132*  BUN 11 12  CREATININE 1.46* 1.86*  CALCIUM 7.0* 9.0    GFR: Estimated Creatinine Clearance: 22.8 mL/min (A) (by C-G formula based on SCr of 1.86 mg/dL (H)).  Liver Function Tests: Recent Labs  Lab 07/08/20 1549  AST 17  ALT 8  ALKPHOS 43  BILITOT 1.0  PROT 5.3*  ALBUMIN 2.4*   Recent Labs  Lab 07/08/20 1549  LIPASE 29   No results for input(s): AMMONIA in the last 168 hours.  Coagulation Profile: No results for input(s): INR, PROTIME in the last 168 hours.  Cardiac Enzymes: No results for input(s): CKTOTAL, CKMB, CKMBINDEX, TROPONINI in the last 168 hours.  BNP (last 3 results) No results for input(s): PROBNP in the last 8760 hours.  Lipid Profile: No results for input(s): CHOL, HDL, LDLCALC, TRIG, CHOLHDL, LDLDIRECT in the last 72 hours.  Thyroid Function Tests: No results for input(s): TSH, T4TOTAL, FREET4, T3FREE, THYROIDAB in the last 72 hours.  Anemia  Panel: No results for input(s): VITAMINB12, FOLATE, FERRITIN, TIBC, IRON, RETICCTPCT in the last 72 hours.  Urine analysis:    Component Value Date/Time   COLORURINE YELLOW 07/08/2020 1913   APPEARANCEUR CLOUDY (A) 07/08/2020 1913   LABSPEC 1.014 07/08/2020 1913   PHURINE 7.0 07/08/2020 1913   GLUCOSEU NEGATIVE 07/08/2020 1913   HGBUR MODERATE (A) 07/08/2020 1913   BILIRUBINUR NEGATIVE 07/08/2020 1913   KETONESUR 5 (A) 07/08/2020 1913   PROTEINUR 30 (A) 07/08/2020 1913   UROBILINOGEN 0.2 11/16/2011 0608   NITRITE NEGATIVE 07/08/2020 1913   LEUKOCYTESUR LARGE (A) 07/08/2020 1913    Sepsis Labs: Lactic Acid, Venous    Component Value Date/Time   LATICACIDVEN 1.3 07/09/2020 0617    MICROBIOLOGY: Recent Results (from the past 240 hour(s))  SARS CORONAVIRUS 2 (TAT 6-24 HRS) Nasopharyngeal Nasopharyngeal Swab  Status: None   Collection Time: 07/05/20  1:43 PM   Specimen: Nasopharyngeal Swab  Result Value Ref Range Status   SARS Coronavirus 2 NEGATIVE NEGATIVE Final    Comment: (NOTE) SARS-CoV-2 target nucleic acids are NOT DETECTED.  The SARS-CoV-2 RNA is generally detectable in upper and lower respiratory specimens during the acute phase of infection. Negative results do not preclude SARS-CoV-2 infection, do not rule out co-infections with other pathogens, and should not be used as the sole basis for treatment or other patient management decisions. Negative results must be combined with clinical observations, patient history, and epidemiological information. The expected result is Negative.  Fact Sheet for Patients: HairSlick.no  Fact Sheet for Healthcare Providers: quierodirigir.com  This test is not yet approved or cleared by the [place] FDA and  has been authorized for detection and/or diagnosis of SARS-CoV-2 by FDA under an Emergency Use Authorization (EUA). This EUA will remain  in effect (meaning  this test can be used) for the duration of the COVID-19 declaration under Se ction 564(b)(1) of the Act, 21 U.S.C. section 360bbb-3(b)(1), unless the authorization is terminated or revoked sooner.  Performed at [facility], [address], [place], [place] [address]   SARS CORONAVIRUS 2 (TAT 6-24 HRS) Nasopharyngeal Nasopharyngeal Swab     Status: None   Collection Time: 07/08/20  8:13 PM   Specimen: Nasopharyngeal Swab  Result Value Ref Range Status   SARS Coronavirus 2 NEGATIVE NEGATIVE Final    Comment: (NOTE) SARS-CoV-2 target nucleic acids are NOT DETECTED.  The SARS-CoV-2 RNA is generally detectable in upper and lower respiratory specimens during the acute phase of infection. Negative results do not preclude SARS-CoV-2 infection, do not rule out co-infections with other pathogens, and should not be used as the sole basis for treatment or other patient management decisions. Negative results must be combined with clinical observations, patient history, and epidemiological information. The expected result is Negative.  Fact Sheet for Patients: HairSlick.no  Fact Sheet for Healthcare Providers: quierodirigir.com  This test is not yet approved or cleared by the [place] FDA and  has been authorized for detection and/or diagnosis of SARS-CoV-2 by FDA under an Emergency Use Authorization (EUA). This EUA will remain  in effect (meaning this test can be used) for the duration of the COVID-19 declaration under Se ction 564(b)(1) of the Act, 21 U.S.C. section 360bbb-3(b)(1), unless the authorization is terminated or revoked sooner.  Performed at [facility], [address] [place], [place] [address]   Culture, blood (routine x 2)     Status: None (Preliminary result)   Collection Time: 07/09/20 12:42 AM   Specimen: BLOOD RIGHT HAND  Result Value Ref Range Status   Specimen Description BLOOD RIGHT HAND   Final   Special Requests   Final    BOTTLES DRAWN AEROBIC AND ANAEROBIC Blood Culture results may not be optimal due to an excessive volume of blood received in culture bottles   Culture   Final    NO GROWTH < 12 HOURS Performed at [facility], [address], [place], [place] [address]    Report Status PENDING  Incomplete  Culture, blood (routine x 2)     Status: None (Preliminary result)   Collection Time: 07/09/20 12:42 AM   Specimen: BLOOD LEFT HAND  Result Value Ref Range Status   Specimen Description BLOOD LEFT HAND  Final   Special Requests   Final    BOTTLES DRAWN AEROBIC AND  ANAEROBIC Blood Culture adequate volume   Culture   Final    NO GROWTH < 12 HOURS Performed at [facility], [address] [place], [place] [address]    Report Status PENDING  Incomplete    RADIOLOGY STUDIES/RESULTS: DG Chest 2 View  Result Date: 07/08/2020 CLINICAL DATA:  Chest pain EXAM: CHEST - 2 VIEW COMPARISON:  06/30/2018 FINDINGS: The heart size and mediastinal contours are within normal limits. Both lungs are clear. The visualized skeletal structures are unremarkable. IMPRESSION: No active cardiopulmonary disease. Electronically Signed   By: Duanne Guess D.O.   On: 07/08/2020 12:53   CT ABDOMEN PELVIS W CONTRAST  Result Date: 07/08/2020 CLINICAL DATA:  Bilateral flank pain, cp,sob.  LLQ abdominal pain. EXAM: CT ABDOMEN AND PELVIS WITH CONTRAST TECHNIQUE: Multidetector CT imaging of the abdomen and pelvis was performed using the standard protocol following bolus administration of intravenous contrast. CONTRAST:  70mL OMNIPAQUE IOHEXOL 300 MG/ML  SOLN COMPARISON:  CT abdomen pelvis 04/16/2018. CT urogram 10/27/2017, CT abdomen pelvis 09/08/2015 FINDINGS: Lower chest: Stable left lower lobe 5 mm micronodule. A 5 mm right lower lobe pulmonary nodule. Linear atelectasis versus scarring of the bases. Hepatobiliary: No focal liver abnormality. No gallstones, gallbladder wall thickening, or  pericholecystic fluid. No biliary dilatation. Pancreas: No focal lesion. Normal pancreatic contour. No surrounding inflammatory changes. No main pancreatic ductal dilatation. Spleen: Normal in size without focal abnormality. Adrenals/Urinary Tract: No adrenal nodule bilaterally. Bilateral striated nephrograms. Bilateral renal cortical scarring. A 1.5 cm fluid density lesion within the right kidney likely represents a simple renal cyst. There is a 7 mm calcified stone within the left kidney. Punctate calcification within the inferior pole of the right kidney. No hydronephrosis. No hydroureter. The urinary bladder is unremarkable. On delayed imaging, there is no urothelial wall thickening and there are no filling defects in the opacified portions of the bilateral collecting systems or ureters. Stomach/Bowel: Stomach is within normal limits. No evidence of bowel wall thickening or dilatation. Status post appendectomy. Vascular/Lymphatic: No abdominal aorta or iliac aneurysm. Mild calcified and noncalcified atherosclerotic plaque of the aorta and its branches. No abdominal, pelvic, or inguinal lymphadenopathy. Reproductive: Status post hysterectomy. No adnexal masses. Other: No intraperitoneal free fluid. No intraperitoneal free gas. No organized fluid collection. Musculoskeletal: No abdominal wall hernia or abnormality. No suspicious lytic or blastic osseous lesions. No acute displaced fracture. Multilevel degenerative changes of the spine. IMPRESSION: 1. Bilateral pyelonephritis with no abscess formation. 2. Nonobstructive 7 mm left and punctate right nephrolithiasis. 3. A possibly new 5 mm right lower lobe pulmonary nodule. No follow-up needed if patient is low-risk. Non-contrast chest CT can be considered in 12 months if patient is high-risk. This recommendation follows the consensus statement: Guidelines for Management of Incidental Pulmonary Nodules Detected on CT Images: From the Fleischner Society 2017;  Radiology 2017; 284:228-243. 4.  Aortic Atherosclerosis (ICD10-I70.0). Electronically Signed   By: Tish Frederickson M.D.   On: 07/08/2020 19:16   US RENAL  Result Date: 07/08/2020 CLINICAL DATA:  Nephrolithiasis EXAM: RENAL / URINARY TRACT ULTRASOUND COMPLETE COMPARISON:  07/08/2020 CT FINDINGS: Right Kidney: Renal measurements: 8.2 x 3.9 x 3.6 cm. = volume: 60 mL. Minimal fullness of the renal pelvis is noted similar to that seen on prior CT examination. No true obstructive changes are noted. Left Kidney: Renal measurements: 8.3 x 4.2 x 3.5 cm. = volume: 64 mL. Known lower pole left renal calculi are not well appreciated on this exam. No hydronephrotic changes are seen. Bladder:  Appears normal for degree of bladder distention. Other: None. IMPRESSION: No significant hydronephrotic changes are noted. Lower pole renal calculi on the left are not well appreciated on this exam. Electronically Signed   By: Alcide Clever M.D.   On: 07/08/2020 21:48     LOS: 0 days   Jeoffrey Massed, MD  [facility]    To contact the attending provider between 7A-7P or the covering provider during after hours 7P-7A, please log into the web site [email or web address] and access using universal [place] password for that web site. If you do not have the password, please call the hospital operator.  07/09/2020, 11:33 AM

## 2020-07-09 NOTE — Progress Notes (Signed)
Lactic acid 2.1. Dr Darreld Mclean text paged. Awaiting call back

## 2020-07-10 ENCOUNTER — Ambulatory Visit: Payer: Medicare Other | Admitting: Podiatry

## 2020-07-10 DIAGNOSIS — R911 Solitary pulmonary nodule: Secondary | ICD-10-CM | POA: Diagnosis not present

## 2020-07-10 DIAGNOSIS — N1832 Chronic kidney disease, stage 3b: Secondary | ICD-10-CM | POA: Diagnosis not present

## 2020-07-10 DIAGNOSIS — N10 Acute tubulo-interstitial nephritis: Secondary | ICD-10-CM | POA: Diagnosis not present

## 2020-07-10 LAB — BASIC METABOLIC PANEL
Anion gap: 9 (ref 5–15)
BUN: 13 mg/dL (ref 8–23)
CO2: 20 mmol/L — ABNORMAL LOW (ref 22–32)
Calcium: 9.2 mg/dL (ref 8.9–10.3)
Chloride: 109 mmol/L (ref 98–111)
Creatinine, Ser: 1.95 mg/dL — ABNORMAL HIGH (ref 0.44–1.00)
GFR, Estimated: 28 mL/min — ABNORMAL LOW (ref >60)
Glucose, Bld: 111 mg/dL — ABNORMAL HIGH (ref 70–99)
Potassium: 3.8 mmol/L (ref 3.5–5.1)
Sodium: 138 mmol/L (ref 135–145)

## 2020-07-10 LAB — CBC
HCT: 32.8 % — ABNORMAL LOW (ref 36.0–46.0)
Hemoglobin: 11 g/dL — ABNORMAL LOW (ref 12.0–15.0)
MCH: 29 pg (ref 26.0–34.0)
MCHC: 33.5 g/dL (ref 30.0–36.0)
MCV: 86.5 fL (ref 80.0–100.0)
Platelets: 280 10*3/uL (ref 150–400)
RBC: 3.79 MIL/uL — ABNORMAL LOW (ref 3.87–5.11)
RDW: 12.1 % (ref 11.5–15.5)
WBC: 7.1 10*3/uL (ref 4.0–10.5)
nRBC: 0 % (ref 0.0–0.2)

## 2020-07-10 LAB — MAGNESIUM: Magnesium: 1.7 mg/dL (ref 1.7–2.4)

## 2020-07-10 MED ORDER — MAGNESIUM SULFATE 2 GM/50ML IV SOLN
2.0000 g | Freq: Once | INTRAVENOUS | Status: AC
  Administered 2020-07-10: 2 g via INTRAVENOUS
  Filled 2020-07-10: qty 50

## 2020-07-10 MED ORDER — POLYETHYLENE GLYCOL 3350 17 G PO PACK
17.0000 g | PACK | Freq: Every day | ORAL | Status: DC
  Filled 2020-07-10: qty 1

## 2020-07-10 MED ORDER — ACETAMINOPHEN 500 MG PO TABS
1000.0000 mg | ORAL_TABLET | Freq: Three times a day (TID) | ORAL | Status: DC
  Administered 2020-07-10 – 2020-07-12 (×5): 1000 mg via ORAL
  Filled 2020-07-10 (×6): qty 2

## 2020-07-10 NOTE — Progress Notes (Signed)
PROGRESS NOTE        PATIENT DETAILS Name: Whitney Parrish Age: 65 y.o. Sex: female Date of Birth: 04-Aug-1955 Admit Date: 07/08/2020 Admitting Physician Charlsie Quest, MD [id number], Viviano Simas, MD  Brief Narrative: Patient is a 65 y.o. female CKD stage IIIb, anxiety, tobacco abuse-who presented with left flank pain, frequency of urination and dark-colored urine-she was found to have acute pyelonephritis and admitted to the hospitalist service.  See below for further details.  Significant events: 3/8>> admit for evaluation/treatment of pyelonephritis  Significant studies: 3/8>> chest x-ray: No active cardiopulmonary disease. 3/8>> CT abdomen/pelvis: Bilateral pyelonephritis, nonobstructive 7 mm right nephrolithiasis 3/8>> renal ultrasound: No hydronephrosis  Antimicrobial therapy: Rocephin: 3/8>>  Microbiology data: 3/8>> urine culture:> 100,000 colonies/mL gram-negative rods 3/9>> blood culture: No growth  Procedures : None  Consults: None  DVT Prophylaxis : enoxaparin (LOVENOX) injection 30 mg Start: 07/08/20 2115  Subjective: Some left flank pain continues but appears more comfortable than yesterday.  Assessment/Plan: Acute pyelonephritis: Left flank pain slightly better-urine cultures growing gram-negative rods-continue IV Rocephin-attempt to minimize IV narcotics-starting scheduled Tylenol.  Continue to watch closely-May need to repeat imaging in the next 48 hours if continues to have severe pain.    Do not think patient had sepsis on admission-sepsis physiology has been ruled out.  CKD stage IIIb: Some jump in creatinine compared to yesterday but still not that far from her prior baseline-watch closely as patient received contrast on admission.  Repeat electrolytes tomorrow.  Anxiety: Stable-continue as needed Xanax  History of asthma: Stable-continue bronchodilators  GERD: Continue PPI  Pulmonary nodule: Repeat noncontrast CT in 12  months as recommended by radiology-she has a history of tobacco use.  Seen incidentally on CT abdomen/pelvis.   Diet: Diet Order            Diet regular Room service appropriate? Yes; Fluid consistency: Thin  Diet effective now                  Code Status: Full code   Family Communication: I will update family over the next few days.  Disposition Plan: Status is: Inpatient admission  The patient will require care spanning > 2 midnights and should be moved to inpatient because: Inpatient level of care appropriate due to severity of illness  Dispo: The patient is from: Home              Anticipated d/c is to: Home              Patient currently is not medically stable to d/c.   Difficult to place patient No     Barriers to Discharge: Pyelonephritis-with severe left flank pain requiring IV Dilaudid-awaiting culture data-remains on IV antibiotics.  Antimicrobial agents: Anti-infectives (From admission, onward)   Start     Dose/Rate Route Frequency Ordered Stop   07/09/20 2030  cefTRIAXone (ROCEPHIN) 1 g in sodium chloride 0.9 % 100 mL IVPB  Status:  Discontinued        1 g 200 mL/hr over 30 Minutes Intravenous Every 24 hours 07/08/20 2109 07/09/20 1141   07/09/20 2030  cefTRIAXone (ROCEPHIN) 2 g in sodium chloride 0.9 % 100 mL IVPB        2 g 200 mL/hr over 30 Minutes Intravenous Every 24 hours 07/09/20 1141     07/08/20 2015  cefTRIAXone (ROCEPHIN) 1 g in  sodium chloride 0.9 % 100 mL IVPB        1 g 200 mL/hr over 30 Minutes Intravenous  Once 07/08/20 2013 07/08/20 2102       Time spent: 25- minutes-Greater than 50% of this time was spent in counseling, explanation of diagnosis, planning of further management, and coordination of care.  MEDICATIONS: Scheduled Meds: . enoxaparin (LOVENOX) injection  30 mg Subcutaneous Q24H  . mometasone-formoterol  2 puff Inhalation BID  . pantoprazole  40 mg Oral QAC breakfast   Continuous Infusions: . cefTRIAXone (ROCEPHIN)   IV 2 g (07/09/20 2024)   PRN Meds:.acetaminophen **OR** acetaminophen, albuterol, ALPRAZolam, HYDROmorphone (DILAUDID) injection, ondansetron **OR** ondansetron (ZOFRAN) IV, oxyCODONE, senna-docusate   PHYSICAL EXAM: Vital signs: Vitals:   07/09/20 1825 07/09/20 2034 07/09/20 2309 07/10/20 0444  BP: 124/80  117/61 (!) 148/78  Pulse: 98 91 91 95  Resp: 16 16 14 16   Temp: 99.1 F (37.3 C)  98.4 F (36.9 C) 99.4 F (37.4 C)  TempSrc: Oral  Oral Oral  SpO2: 98% 97% 95% 96%  Weight:      Height:       Filed Weights   07/08/20 1446  Weight: 54 kg   Body mass index is 22.48 kg/m.   Gen Exam:Alert awake-not in any distress HEENT:atraumatic, normocephalic Chest: B/L clear to auscultation anteriorly CVS:S1S2 regular Abdomen:soft non tender, non distended.  Left CVA tenderness+++ Extremities:no edema Neurology: Non focal Skin: no rash  I have personally reviewed following labs and imaging studies  LABORATORY DATA: CBC: Recent Labs  Lab 07/08/20 1224 07/09/20 0042 07/10/20 0106  WBC 12.1* 10.0 7.1  HGB 12.8 10.9* 11.0*  HCT 40.6 33.5* 32.8*  MCV 91.9 87.5 86.5  PLT 272 252 280    Basic Metabolic Panel: Recent Labs  Lab 07/08/20 1549 07/09/20 0042 07/10/20 0106  NA 138 135 138  K 3.9 3.4* 3.8  CL 115* 105 109  CO2 15* 18* 20*  GLUCOSE 73 132* 111*  BUN 11 12 13   CREATININE 1.46* 1.86* 1.95*  CALCIUM 7.0* 9.0 9.2  MG  --   --  1.7    GFR: Estimated Creatinine Clearance: 21.7 mL/min (A) (by C-G formula based on SCr of 1.95 mg/dL (H)).  Liver Function Tests: Recent Labs  Lab 07/08/20 1549  AST 17  ALT 8  ALKPHOS 43  BILITOT 1.0  PROT 5.3*  ALBUMIN 2.4*   Recent Labs  Lab 07/08/20 1549  LIPASE 29   No results for input(s): AMMONIA in the last 168 hours.  Coagulation Profile: No results for input(s): INR, PROTIME in the last 168 hours.  Cardiac Enzymes: No results for input(s): CKTOTAL, CKMB, CKMBINDEX, TROPONINI in the last 168  hours.  BNP (last 3 results) No results for input(s): PROBNP in the last 8760 hours.  Lipid Profile: No results for input(s): CHOL, HDL, LDLCALC, TRIG, CHOLHDL, LDLDIRECT in the last 72 hours.  Thyroid Function Tests: No results for input(s): TSH, T4TOTAL, FREET4, T3FREE, THYROIDAB in the last 72 hours.  Anemia Panel: No results for input(s): VITAMINB12, FOLATE, FERRITIN, TIBC, IRON, RETICCTPCT in the last 72 hours.  Urine analysis:    Component Value Date/Time   COLORURINE YELLOW 07/08/2020 1913   APPEARANCEUR CLOUDY (A) 07/08/2020 1913   LABSPEC 1.014 07/08/2020 1913   PHURINE 7.0 07/08/2020 1913   GLUCOSEU NEGATIVE 07/08/2020 1913   HGBUR MODERATE (A) 07/08/2020 1913   BILIRUBINUR NEGATIVE 07/08/2020 1913   KETONESUR 5 (A) 07/08/2020 1913   PROTEINUR 30 (A)  07/08/2020 1913   UROBILINOGEN 0.2 11/16/2011 0608   NITRITE NEGATIVE 07/08/2020 1913   LEUKOCYTESUR LARGE (A) 07/08/2020 1913    Sepsis Labs: Lactic Acid, Venous    Component Value Date/Time   LATICACIDVEN 1.3 07/09/2020 0617    MICROBIOLOGY: Recent Results (from the past 240 hour(s))  SARS CORONAVIRUS 2 (TAT 6-24 HRS) Nasopharyngeal Nasopharyngeal Swab     Status: None   Collection Time: 07/05/20  1:43 PM   Specimen: Nasopharyngeal Swab  Result Value Ref Range Status   SARS Coronavirus 2 NEGATIVE NEGATIVE Final    Comment: (NOTE) SARS-CoV-2 target nucleic acids are NOT DETECTED.  The SARS-CoV-2 RNA is generally detectable in upper and lower respiratory specimens during the acute phase of infection. Negative results do not preclude SARS-CoV-2 infection, do not rule out co-infections with other pathogens, and should not be used as the sole basis for treatment or other patient management decisions. Negative results must be combined with clinical observations, patient history, and epidemiological information. The expected result is Negative.  Fact Sheet for  Patients: HairSlick.no  Fact Sheet for Healthcare Providers: quierodirigir.com  This test is not yet approved or cleared by the [place] FDA and  has been authorized for detection and/or diagnosis of SARS-CoV-2 by FDA under an Emergency Use Authorization (EUA). This EUA will remain  in effect (meaning this test can be used) for the duration of the COVID-19 declaration under Se ction 564(b)(1) of the Act, 21 U.S.C. section 360bbb-3(b)(1), unless the authorization is terminated or revoked sooner.  Performed at [facility], [address], [place], [place] [address]   Urine culture     Status: Abnormal (Preliminary result)   Collection Time: 07/08/20  7:13 PM   Specimen: Urine, Random  Result Value Ref Range Status   Specimen Description URINE, RANDOM  Final   Special Requests NONE  Final   Culture (A)  Final    >=100,000 COLONIES/mL GRAM NEGATIVE RODS SUSCEPTIBILITIES TO FOLLOW Performed at [facility], [address] [place], [place] [address]    Report Status PENDING  Incomplete  SARS CORONAVIRUS 2 (TAT 6-24 HRS) Nasopharyngeal Nasopharyngeal Swab     Status: None   Collection Time: 07/08/20  8:13 PM   Specimen: Nasopharyngeal Swab  Result Value Ref Range Status   SARS Coronavirus 2 NEGATIVE NEGATIVE Final    Comment: (NOTE) SARS-CoV-2 target nucleic acids are NOT DETECTED.  The SARS-CoV-2 RNA is generally detectable in upper and lower respiratory specimens during the acute phase of infection. Negative results do not preclude SARS-CoV-2 infection, do not rule out co-infections with other pathogens, and should not be used as the sole basis for treatment or other patient management decisions. Negative results must be combined with clinical observations, patient history, and epidemiological information. The expected result is Negative.  Fact Sheet for  Patients: HairSlick.no  Fact Sheet for Healthcare Providers: quierodirigir.com  This test is not yet approved or cleared by the [place] FDA and  has been authorized for detection and/or diagnosis of SARS-CoV-2 by FDA under an Emergency Use Authorization (EUA). This EUA will remain  in effect (meaning this test can be used) for the duration of the COVID-19 declaration under Se ction 564(b)(1) of the Act, 21 U.S.C. section 360bbb-3(b)(1), unless the authorization is terminated or revoked sooner.  Performed at [facility], [address] [place], [place] [address]   Culture, blood (routine x 2)     Status: None (Preliminary result)   Collection  Time: 07/09/20 12:42 AM   Specimen: BLOOD RIGHT HAND  Result Value Ref Range Status   Specimen Description BLOOD RIGHT HAND  Final   Special Requests   Final    BOTTLES DRAWN AEROBIC AND ANAEROBIC Blood Culture results may not be optimal due to an excessive volume of blood received in culture bottles   Culture   Final    NO GROWTH < 12 HOURS Performed at [facility], [address] [place], [place] [address]    Report Status PENDING  Incomplete  Culture, blood (routine x 2)     Status: None (Preliminary result)   Collection Time: 07/09/20 12:42 AM   Specimen: BLOOD LEFT HAND  Result Value Ref Range Status   Specimen Description BLOOD LEFT HAND  Final   Special Requests   Final    BOTTLES DRAWN AEROBIC AND ANAEROBIC Blood Culture adequate volume   Culture   Final    NO GROWTH < 12 HOURS Performed at [facility], [address] [place], [place] [address]    Report Status PENDING  Incomplete    RADIOLOGY STUDIES/RESULTS: DG Chest 2 View  Result Date: 07/08/2020 CLINICAL DATA:  Chest pain EXAM: CHEST - 2 VIEW COMPARISON:  06/30/2018 FINDINGS: The heart size and mediastinal contours are within normal limits. Both lungs are clear. The visualized skeletal  structures are unremarkable. IMPRESSION: No active cardiopulmonary disease. Electronically Signed   By: Duanne GuessNicholas  Plundo D.O.   On: 07/08/2020 12:53   CT ABDOMEN PELVIS W CONTRAST  Result Date: 07/08/2020 CLINICAL DATA:  Bilateral flank pain, cp,sob.  LLQ abdominal pain. EXAM: CT ABDOMEN AND PELVIS WITH CONTRAST TECHNIQUE: Multidetector CT imaging of the abdomen and pelvis was performed using the standard protocol following bolus administration of intravenous contrast. CONTRAST:  80mL OMNIPAQUE IOHEXOL 300 MG/ML  SOLN COMPARISON:  CT abdomen pelvis 04/16/2018. CT urogram 10/27/2017, CT abdomen pelvis 09/08/2015 FINDINGS: Lower chest: Stable left lower lobe 5 mm micronodule. A 5 mm right lower lobe pulmonary nodule. Linear atelectasis versus scarring of the bases. Hepatobiliary: No focal liver abnormality. No gallstones, gallbladder wall thickening, or pericholecystic fluid. No biliary dilatation. Pancreas: No focal lesion. Normal pancreatic contour. No surrounding inflammatory changes. No main pancreatic ductal dilatation. Spleen: Normal in size without focal abnormality. Adrenals/Urinary Tract: No adrenal nodule bilaterally. Bilateral striated nephrograms. Bilateral renal cortical scarring. A 1.5 cm fluid density lesion within the right kidney likely represents a simple renal cyst. There is a 7 mm calcified stone within the left kidney. Punctate calcification within the inferior pole of the right kidney. No hydronephrosis. No hydroureter. The urinary bladder is unremarkable. On delayed imaging, there is no urothelial wall thickening and there are no filling defects in the opacified portions of the bilateral collecting systems or ureters. Stomach/Bowel: Stomach is within normal limits. No evidence of bowel wall thickening or dilatation. Status post appendectomy. Vascular/Lymphatic: No abdominal aorta or iliac aneurysm. Mild calcified and noncalcified atherosclerotic plaque of the aorta and its branches. No  abdominal, pelvic, or inguinal lymphadenopathy. Reproductive: Status post hysterectomy. No adnexal masses. Other: No intraperitoneal free fluid. No intraperitoneal free gas. No organized fluid collection. Musculoskeletal: No abdominal wall hernia or abnormality. No suspicious lytic or blastic osseous lesions. No acute displaced fracture. Multilevel degenerative changes of the spine. IMPRESSION: 1. Bilateral pyelonephritis with no abscess formation. 2. Nonobstructive 7 mm left and punctate right nephrolithiasis. 3. A possibly new 5 mm right lower lobe pulmonary nodule. No follow-up needed if patient is low-risk. Non-contrast  chest CT can be considered in 12 months if patient is high-risk. This recommendation follows the consensus statement: Guidelines for Management of Incidental Pulmonary Nodules Detected on CT Images: From the Fleischner Society 2017; Radiology 2017; 284:228-243. 4.  Aortic Atherosclerosis (ICD10-I70.0). Electronically Signed   By: Tish Frederickson M.D.   On: 07/08/2020 19:16   US RENAL  Result Date: 07/08/2020 CLINICAL DATA:  Nephrolithiasis EXAM: RENAL / URINARY TRACT ULTRASOUND COMPLETE COMPARISON:  07/08/2020 CT FINDINGS: Right Kidney: Renal measurements: 8.2 x 3.9 x 3.6 cm. = volume: 60 mL. Minimal fullness of the renal pelvis is noted similar to that seen on prior CT examination. No true obstructive changes are noted. Left Kidney: Renal measurements: 8.3 x 4.2 x 3.5 cm. = volume: 64 mL. Known lower pole left renal calculi are not well appreciated on this exam. No hydronephrotic changes are seen. Bladder: Appears normal for degree of bladder distention. Other: None. IMPRESSION: No significant hydronephrotic changes are noted. Lower pole renal calculi on the left are not well appreciated on this exam. Electronically Signed   By: Alcide Clever M.D.   On: 07/08/2020 21:48     LOS: 1 day   Jeoffrey Massed, MD  [facility]    To contact the attending provider between 7A-7P or the  covering provider during after hours 7P-7A, please log into the web site [email or web address] and access using universal  password for that web site. If you do not have the password, please call the hospital operator.  07/10/2020, 11:46 AM

## 2020-07-11 DIAGNOSIS — R911 Solitary pulmonary nodule: Secondary | ICD-10-CM | POA: Diagnosis not present

## 2020-07-11 DIAGNOSIS — N1832 Chronic kidney disease, stage 3b: Secondary | ICD-10-CM | POA: Diagnosis not present

## 2020-07-11 DIAGNOSIS — N10 Acute tubulo-interstitial nephritis: Secondary | ICD-10-CM | POA: Diagnosis not present

## 2020-07-11 LAB — CBC
HCT: 31.8 % — ABNORMAL LOW (ref 36.0–46.0)
Hemoglobin: 10.3 g/dL — ABNORMAL LOW (ref 12.0–15.0)
MCH: 28.3 pg (ref 26.0–34.0)
MCHC: 32.4 g/dL (ref 30.0–36.0)
MCV: 87.4 fL (ref 80.0–100.0)
Platelets: 269 10*3/uL (ref 150–400)
RBC: 3.64 MIL/uL — ABNORMAL LOW (ref 3.87–5.11)
RDW: 12.1 % (ref 11.5–15.5)
WBC: 6.2 10*3/uL (ref 4.0–10.5)
nRBC: 0 % (ref 0.0–0.2)

## 2020-07-11 LAB — BASIC METABOLIC PANEL
Anion gap: 10 (ref 5–15)
BUN: 10 mg/dL (ref 8–23)
CO2: 20 mmol/L — ABNORMAL LOW (ref 22–32)
Calcium: 9.3 mg/dL (ref 8.9–10.3)
Chloride: 110 mmol/L (ref 98–111)
Creatinine, Ser: 1.75 mg/dL — ABNORMAL HIGH (ref 0.44–1.00)
GFR, Estimated: 32 mL/min — ABNORMAL LOW (ref >60)
Glucose, Bld: 103 mg/dL — ABNORMAL HIGH (ref 70–99)
Potassium: 3.3 mmol/L — ABNORMAL LOW (ref 3.5–5.1)
Sodium: 140 mmol/L (ref 135–145)

## 2020-07-11 LAB — URINE CULTURE: Culture: >=100000 — AB

## 2020-07-11 MED ORDER — SODIUM CHLORIDE 0.9 % IV SOLN
2.0000 g | INTRAVENOUS | Status: AC
  Administered 2020-07-11: 2 g via INTRAVENOUS
  Filled 2020-07-11: qty 2

## 2020-07-11 MED ORDER — MECLIZINE HCL 25 MG PO TABS
25.0000 mg | ORAL_TABLET | Freq: Two times a day (BID) | ORAL | Status: DC
  Administered 2020-07-11 (×2): 25 mg via ORAL
  Filled 2020-07-11 (×4): qty 1

## 2020-07-11 MED ORDER — POTASSIUM CHLORIDE CRYS ER 20 MEQ PO TBCR
40.0000 meq | EXTENDED_RELEASE_TABLET | Freq: Once | ORAL | Status: AC
  Administered 2020-07-11: 40 meq via ORAL
  Filled 2020-07-11: qty 2

## 2020-07-11 MED ORDER — CEPHALEXIN 500 MG PO CAPS
500.0000 mg | ORAL_CAPSULE | Freq: Three times a day (TID) | ORAL | Status: DC
  Administered 2020-07-12: 500 mg via ORAL
  Filled 2020-07-11: qty 1

## 2020-07-11 MED ORDER — METOCLOPRAMIDE HCL 5 MG PO TABS
5.0000 mg | ORAL_TABLET | Freq: Two times a day (BID) | ORAL | Status: DC
  Administered 2020-07-11 (×2): 5 mg via ORAL
  Filled 2020-07-11 (×2): qty 1

## 2020-07-11 NOTE — Progress Notes (Signed)
PROGRESS NOTE        PATIENT DETAILS Name: Whitney Parrish Age: 65 y.o. Sex: female Date of Birth: 06-30-1955 Admit Date: 07/08/2020 Admitting Physician Charlsie QuestVishal R Patel, MD ZOX:[id number]:Kadakia, Viviano SimasAjay, MD  Brief Narrative: Patient is a 65 y.o. female CKD stage IIIb, anxiety, tobacco abuse-who presented with left flank pain, frequency of urination and dark-colored urine-she was found to have acute pyelonephritis and admitted to the hospitalist service.  See below for further details.  Significant events: 3/8>> admit for evaluation/treatment of pyelonephritis  Significant studies: 3/8>> chest x-ray: No active cardiopulmonary disease. 3/8>> CT abdomen/pelvis: Bilateral pyelonephritis, nonobstructive 7 mm right nephrolithiasis 3/8>> renal ultrasound: No hydronephrosis  Antimicrobial therapy: Rocephin: 3/8>>  Microbiology data: 3/8>> urine culture: Proteus-sensitivity pending 3/9>> blood culture: No growth  Procedures : None  Consults: None  DVT Prophylaxis : enoxaparin (LOVENOX) injection 30 mg Start: 07/08/20 2115  Subjective: Left flank pain is better-she is not able to move around the room with less issues.  Assessment/Plan: Acute pyelonephritis: Left flank pain has improved but still persistent-pain now relatively well controlled with mostly oral agents-continue Rocephin-urine culture positive for Proteus-awaiting sensitivities.  Continue to minimize narcotics as much as possible.  Suspect that if clinical improvement continues-pain is controlled on oral agents-she could be discharged home on 3/12.    Do not think patient had sepsis on admission-sepsis physiology has been ruled out.  CKD stage IIIb: Creatinine has stabilized-some mild bump in creatinine most likely due to contrast-induced nephropathy.  Continue to replete electrolytes periodically.    Hypokalemia: Replete and recheck  Anxiety: Stable-continue as needed Xanax  History of asthma:  Stable-continue bronchodilators  GERD: Continue PPI  Pulmonary nodule: Repeat noncontrast CT in 12 months as recommended by radiology-she has a history of tobacco use.  Seen incidentally on CT abdomen/pelvis.   Diet: Diet Order            Diet regular Room service appropriate? Yes; Fluid consistency: Thin  Diet effective now                  Code Status: Full code   Family Communication: None at bedside-patient awake/alert-and can update family herself.  Disposition Plan: Status is: Inpatient admission  The patient will require care spanning > 2 midnights and should be moved to inpatient because: Inpatient level of care appropriate due to severity of illness  Dispo: The patient is from: Home              Anticipated d/c is to: Home              Patient currently is not medically stable to d/c.  Tentative discharge on 3/12   Difficult to place patient No     Barriers to Discharge: Pyelonephritis-still with severe left flank pain-urine cultures pending.  Antimicrobial agents: Anti-infectives (From admission, onward)   Start     Dose/Rate Route Frequency Ordered Stop   07/09/20 2030  cefTRIAXone (ROCEPHIN) 1 g in sodium chloride 0.9 % 100 mL IVPB  Status:  Discontinued        1 g 200 mL/hr over 30 Minutes Intravenous Every 24 hours 07/08/20 2109 07/09/20 1141   07/09/20 2030  cefTRIAXone (ROCEPHIN) 2 g in sodium chloride 0.9 % 100 mL IVPB        2 g 200 mL/hr over 30 Minutes Intravenous Every 24 hours 07/09/20  1141     07/08/20 2015  cefTRIAXone (ROCEPHIN) 1 g in sodium chloride 0.9 % 100 mL IVPB        1 g 200 mL/hr over 30 Minutes Intravenous  Once 07/08/20 2013 07/08/20 2102       Time spent: 25- minutes-Greater than 50% of this time was spent in counseling, explanation of diagnosis, planning of further management, and coordination of care.  MEDICATIONS: Scheduled Meds: . acetaminophen  1,000 mg Oral Q8H  . enoxaparin (LOVENOX) injection  30 mg Subcutaneous  Q24H  . meclizine  25 mg Oral BID  . metoCLOPramide  5 mg Oral BID  . mometasone-formoterol  2 puff Inhalation BID  . pantoprazole  40 mg Oral QAC breakfast  . polyethylene glycol  17 g Oral Daily   Continuous Infusions: . cefTRIAXone (ROCEPHIN)  IV 2 g (07/10/20 2219)   PRN Meds:.albuterol, ALPRAZolam, HYDROmorphone (DILAUDID) injection, ondansetron **OR** ondansetron (ZOFRAN) IV, oxyCODONE, senna-docusate   PHYSICAL EXAM: Vital signs: Vitals:   07/10/20 2005 07/10/20 2321 07/11/20 0423 07/11/20 1203  BP:  136/79 (!) 146/82 (!) 136/91  Pulse:  81 75 78  Resp:  18 16 17   Temp:  98 F (36.7 C) 98.8 F (37.1 C) 98 F (36.7 C)  TempSrc:  Oral Oral Oral  SpO2: 98% 100% 100% 99%  Weight:      Height:       Filed Weights   07/08/20 1446  Weight: 54 kg   Body mass index is 22.48 kg/m.   Gen Exam:Alert awake-not in any distress HEENT:atraumatic, normocephalic Chest: B/L clear to auscultation anteriorly CVS:S1S2 regular Abdomen:soft non tender, non distended-positive left flank/CVA tenderness (less tender than the past few days) Extremities:no edema Neurology: Non focal Skin: no rash  I have personally reviewed following labs and imaging studies  LABORATORY DATA: CBC: Recent Labs  Lab 07/08/20 1224 07/09/20 0042 07/10/20 0106 07/11/20 0111  WBC 12.1* 10.0 7.1 6.2  HGB 12.8 10.9* 11.0* 10.3*  HCT 40.6 33.5* 32.8* 31.8*  MCV 91.9 87.5 86.5 87.4  PLT 272 252 280 269    Basic Metabolic Panel: Recent Labs  Lab 07/08/20 1549 07/09/20 0042 07/10/20 0106 07/11/20 0111  NA 138 135 138 140  K 3.9 3.4* 3.8 3.3*  CL 115* 105 109 110  CO2 15* 18* 20* 20*  GLUCOSE 73 132* 111* 103*  BUN 11 12 13 10   CREATININE 1.46* 1.86* 1.95* 1.75*  CALCIUM 7.0* 9.0 9.2 9.3  MG  --   --  1.7  --     GFR: Estimated Creatinine Clearance: 24.2 mL/min (A) (by C-G formula based on SCr of 1.75 mg/dL (H)).  Liver Function Tests: Recent Labs  Lab 07/08/20 1549  AST 17  ALT 8   ALKPHOS 43  BILITOT 1.0  PROT 5.3*  ALBUMIN 2.4*   Recent Labs  Lab 07/08/20 1549  LIPASE 29   No results for input(s): AMMONIA in the last 168 hours.  Coagulation Profile: No results for input(s): INR, PROTIME in the last 168 hours.  Cardiac Enzymes: No results for input(s): CKTOTAL, CKMB, CKMBINDEX, TROPONINI in the last 168 hours.  BNP (last 3 results) No results for input(s): PROBNP in the last 8760 hours.  Lipid Profile: No results for input(s): CHOL, HDL, LDLCALC, TRIG, CHOLHDL, LDLDIRECT in the last 72 hours.  Thyroid Function Tests: No results for input(s): TSH, T4TOTAL, FREET4, T3FREE, THYROIDAB in the last 72 hours.  Anemia Panel: No results for input(s): VITAMINB12, FOLATE, FERRITIN, TIBC, IRON, RETICCTPCT  in the last 72 hours.  Urine analysis:    Component Value Date/Time   COLORURINE YELLOW 07/08/2020 1913   APPEARANCEUR CLOUDY (A) 07/08/2020 1913   LABSPEC 1.014 07/08/2020 1913   PHURINE 7.0 07/08/2020 1913   GLUCOSEU NEGATIVE 07/08/2020 1913   HGBUR MODERATE (A) 07/08/2020 1913   BILIRUBINUR NEGATIVE 07/08/2020 1913   KETONESUR 5 (A) 07/08/2020 1913   PROTEINUR 30 (A) 07/08/2020 1913   UROBILINOGEN 0.2 11/16/2011 0608   NITRITE NEGATIVE 07/08/2020 1913   LEUKOCYTESUR LARGE (A) 07/08/2020 1913    Sepsis Labs: Lactic Acid, Venous    Component Value Date/Time   LATICACIDVEN 1.3 07/09/2020 0617    MICROBIOLOGY: Recent Results (from the past 240 hour(s))  SARS CORONAVIRUS 2 (TAT 6-24 HRS) Nasopharyngeal Nasopharyngeal Swab     Status: None   Collection Time: 07/05/20  1:43 PM   Specimen: Nasopharyngeal Swab  Result Value Ref Range Status   SARS Coronavirus 2 NEGATIVE NEGATIVE Final    Comment: (NOTE) SARS-CoV-2 target nucleic acids are NOT DETECTED.  The SARS-CoV-2 RNA is generally detectable in upper and lower respiratory specimens during the acute phase of infection. Negative results do not preclude SARS-CoV-2 infection, do not rule  out co-infections with other pathogens, and should not be used as the sole basis for treatment or other patient management decisions. Negative results must be combined with clinical observations, patient history, and epidemiological information. The expected result is Negative.  Fact Sheet for Patients: HairSlick.no  Fact Sheet for Healthcare Providers: quierodirigir.com  This test is not yet approved or cleared by the [place] FDA and  has been authorized for detection and/or diagnosis of SARS-CoV-2 by FDA under an Emergency Use Authorization (EUA). This EUA will remain  in effect (meaning this test can be used) for the duration of the COVID-19 declaration under Se ction 564(b)(1) of the Act, 21 U.S.C. section 360bbb-3(b)(1), unless the authorization is terminated or revoked sooner.  Performed at [facility], [address] [place], [place] [address]   Urine culture     Status: Abnormal   Collection Time: 07/08/20  7:13 PM   Specimen: Urine, Random  Result Value Ref Range Status   Specimen Description URINE, RANDOM  Final   Special Requests   Final    NONE Performed at [facility], [address] [place], [place] [address]    Culture >=100,000 COLONIES/mL PROTEUS MIRABILIS (A)  Final   Report Status 07/11/2020 FINAL  Final   Organism ID, Bacteria PROTEUS MIRABILIS (A)  Final      Susceptibility   Proteus mirabilis - MIC*    AMPICILLIN <=2 SENSITIVE Sensitive     CEFAZOLIN <=4 SENSITIVE Sensitive     CEFEPIME <=0.12 SENSITIVE Sensitive     CEFTRIAXONE <=0.25 SENSITIVE Sensitive     CIPROFLOXACIN <=0.25 SENSITIVE Sensitive     GENTAMICIN <=1 SENSITIVE Sensitive     IMIPENEM 1 SENSITIVE Sensitive     NITROFURANTOIN RESISTANT Resistant     TRIMETH/SULFA <=20 SENSITIVE Sensitive     AMPICILLIN/SULBACTAM <=2 SENSITIVE Sensitive     PIP/TAZO <=4 SENSITIVE Sensitive     * >=100,000 COLONIES/mL PROTEUS  MIRABILIS  SARS CORONAVIRUS 2 (TAT 6-24 HRS) Nasopharyngeal Nasopharyngeal Swab     Status: None   Collection Time: 07/08/20  8:13 PM   Specimen: Nasopharyngeal Swab  Result Value Ref Range Status   SARS Coronavirus 2 NEGATIVE NEGATIVE Final    Comment: (NOTE) SARS-CoV-2 target nucleic acids are NOT DETECTED.  The  SARS-CoV-2 RNA is generally detectable in upper and lower respiratory specimens during the acute phase of infection. Negative results do not preclude SARS-CoV-2 infection, do not rule out co-infections with other pathogens, and should not be used as the sole basis for treatment or other patient management decisions. Negative results must be combined with clinical observations, patient history, and epidemiological information. The expected result is Negative.  Fact Sheet for Patients: HairSlick.no  Fact Sheet for Healthcare Providers: quierodirigir.com  This test is not yet approved or cleared by the [place] FDA and  has been authorized for detection and/or diagnosis of SARS-CoV-2 by FDA under an Emergency Use Authorization (EUA). This EUA will remain  in effect (meaning this test can be used) for the duration of the COVID-19 declaration under Se ction 564(b)(1) of the Act, 21 U.S.C. section 360bbb-3(b)(1), unless the authorization is terminated or revoked sooner.  Performed at [facility], [address], [place], [place] [address]   Culture, blood (routine x 2)     Status: None (Preliminary result)   Collection Time: 07/09/20 12:42 AM   Specimen: BLOOD RIGHT HAND  Result Value Ref Range Status   Specimen Description BLOOD RIGHT HAND  Final   Special Requests   Final    BOTTLES DRAWN AEROBIC AND ANAEROBIC Blood Culture results may not be optimal due to an excessive volume of blood received in culture bottles   Culture   Final    NO GROWTH 1 DAY Performed at [facility], [address]  [place], [place] [address]    Report Status PENDING  Incomplete  Culture, blood (routine x 2)     Status: None (Preliminary result)   Collection Time: 07/09/20 12:42 AM   Specimen: BLOOD LEFT HAND  Result Value Ref Range Status   Specimen Description BLOOD LEFT HAND  Final   Special Requests   Final    BOTTLES DRAWN AEROBIC AND ANAEROBIC Blood Culture adequate volume   Culture   Final    NO GROWTH 1 DAY Performed at [facility], [address], [place], [place] [address]    Report Status PENDING  Incomplete    RADIOLOGY STUDIES/RESULTS: No results found.   LOS: 2 days   Jeoffrey Massed, MD  [facility]    To contact the attending provider between 7A-7P or the covering provider during after hours 7P-7A, please log into the web site [email or web address] and access using universal [place] password for that web site. If you do not have the password, please call the hospital operator.  07/11/2020, 12:10 PM

## 2020-07-12 DIAGNOSIS — N10 Acute tubulo-interstitial nephritis: Secondary | ICD-10-CM | POA: Diagnosis not present

## 2020-07-12 DIAGNOSIS — R911 Solitary pulmonary nodule: Secondary | ICD-10-CM | POA: Diagnosis not present

## 2020-07-12 DIAGNOSIS — N1832 Chronic kidney disease, stage 3b: Secondary | ICD-10-CM | POA: Diagnosis not present

## 2020-07-12 LAB — BASIC METABOLIC PANEL
Anion gap: 7 (ref 5–15)
BUN: 8 mg/dL (ref 8–23)
CO2: 23 mmol/L (ref 22–32)
Calcium: 9.2 mg/dL (ref 8.9–10.3)
Chloride: 112 mmol/L — ABNORMAL HIGH (ref 98–111)
Creatinine, Ser: 1.5 mg/dL — ABNORMAL HIGH (ref 0.44–1.00)
GFR, Estimated: 38 mL/min — ABNORMAL LOW (ref >60)
Glucose, Bld: 120 mg/dL — ABNORMAL HIGH (ref 70–99)
Potassium: 4 mmol/L (ref 3.5–5.1)
Sodium: 142 mmol/L (ref 135–145)

## 2020-07-12 MED ORDER — OXYCODONE HCL 5 MG PO TABS: 5.0000 mg | ORAL_TABLET | Freq: Four times a day (QID) | ORAL | 0 refills | Status: DC | PRN

## 2020-07-12 MED ORDER — GUSELKUMAB 100 MG/ML ~~LOC~~ SOAJ: 100.0000 mg | SUBCUTANEOUS | Status: DC

## 2020-07-12 MED ORDER — CEPHALEXIN 500 MG PO CAPS: 500.0000 mg | ORAL_CAPSULE | Freq: Three times a day (TID) | ORAL | 0 refills | Status: DC

## 2020-07-12 NOTE — Discharge Summary (Signed)
PATIENT DETAILS Name: Whitney Parrish Age: 65 y.o. Sex: female Date of Birth: 03/13/1956 MRN: [id number]. Admitting Physician: Whitney Quest, MD NWG:[id number], Ajay, MD  Admit Date: 07/08/2020 Discharge date: 07/12/2020  Recommendations for Outpatient Follow-up:  1. Follow up with PCP in 1-2 weeks 2. Please obtain CMP/CBC in one week 3. Incidental finding-right lower lobe pulmonary nodule-repeat CT chest in 12 months.  Admitted From:  Home   Disposition: Home   Home Health: No  Equipment/Devices: None  Discharge Condition: Stable  CODE STATUS: FULL CODE  Diet recommendation:  Diet Order            Diet - low sodium heart healthy           Diet regular Room service appropriate? Yes; Fluid consistency: Thin  Diet effective now                  Brief Narrative: Patient is a 65 y.o. female CKD stage IIIb, anxiety, tobacco abuse-who presented with left flank pain, frequency of urination and dark-colored urine-she was found to have acute pyelonephritis and admitted to the hospitalist service.  See below for further details.  Significant events: 3/8>> admit for evaluation/treatment of pyelonephritis  Significant studies: 3/8>> chest x-ray: No active cardiopulmonary disease. 3/8>> CT abdomen/pelvis: Bilateral pyelonephritis, nonobstructive 7 mm right nephrolithiasis 3/8>> renal ultrasound: No hydronephrosis  Antimicrobial therapy: Rocephin: 3/8>>3/11 Keflex:3/12>>   Microbiology data: 3/8>> urine culture: Proteus 3/9>> blood culture: No growth  Procedures : None  Consults: None  Brief Hospital Course: Acute pyelonephritis: Significantly better-had severe pain on initial presentation requiring IV narcotics-over the past few days-pain has gradually improved-and is relatively well controlled with Tylenol-and is at times still requiring oral narcotics for breakthrough pain.  She still has some left-sided CVA tenderness but it is much better than the  past few days.  She has been afebrile-and has been transitioned from IV Rocephin to oral antibiotics.  Since she has overall improved-suspect it is prudent to continue oral antibiotics in the outpatient setting.  I have asked her to follow-up with her primary care practitioner early next week for a quick hospital follow-up.  If in the unlikely event that she starts having fever-and continues to have worsening flank pain-she may need a repeat renal ultrasound to rule out an abscess.  Clinically she does not appear to have abscess at this point as her clinical trajectory has significantly improved compared to how she first presented to the hospital.    Note-have provided the patient #10 tablets of 5 mg oxycodone to use for breakthrough pain if not responsive to Tylenol  CKD stage IIIb:  Creatinine has improved-and close to baseline.    Hypokalemia: Repleted  Anxiety: Stable-continue as needed Xanax  History of asthma: Stable-continue bronchodilators  GERD: Continue PPI  Pulmonary nodule: Repeat noncontrast CT in 12 months as recommended by radiology-she has a history of tobacco use.  Seen incidentally on CT abdomen/pelvis.  Psoriasis: Follow with outpatient rheumatology-hold injection monoclonal antibody injection until seen by PCP/primary dermatology  Discharge Diagnoses:  Principal Problem:   Sepsis (HCC) Active Problems:   Acute pyelonephritis   CKD (chronic kidney disease) stage 3, GFR 30-59 ml/min (HCC)   Right lower lobe pulmonary nodule   Discharge Instructions:  Activity:  As tolerated  Discharge Instructions    Call MD for:  persistant nausea and vomiting   Complete by: As directed    Call MD for:  severe uncontrolled pain   Complete by: As directed  Call MD for:  temperature >100.4   Complete by: As directed    Diet - low sodium heart healthy   Complete by: As directed    Discharge instructions   Complete by: As directed    Follow with Primary MD  [name], MD early next week  If you have fever-or worsening pain-please seek immediate medical attention.  You were found to have a incidental finding of a right lung nodule in your CT scan of the abdomen-it is recommended that you get a repeat CT scan in 12 months.  In some cases these can turn into cancer.  Hold guselkumab (injection for psoriasis) until you talk to your primary care practitioner/primary rheumatologist   Please get a complete blood count and chemistry panel checked by your Primary MD at your next visit, and again as instructed by your Primary MD.  Get Medicines reviewed and adjusted: Please take all your medications with you for your next visit with your Primary MD  Laboratory/radiological data: Please request your Primary MD to go over all hospital tests and procedure/radiological results at the follow up, please ask your Primary MD to get all Hospital records sent to his/her office.  In some cases, they will be blood work, cultures and biopsy results pending at the time of your discharge. Please request that your primary care M.D. follows up on these results.  Also Note the following: If you experience worsening of your admission symptoms, develop shortness of breath, life threatening emergency, suicidal or homicidal thoughts you must seek medical attention immediately by calling 911 or calling your MD immediately  if symptoms less severe.  You must read complete instructions/literature along with all the possible adverse reactions/side effects for all the Medicines you take and that have been prescribed to you. Take any new Medicines after you have completely understood and accpet all the possible adverse reactions/side effects.   Do not drive when taking Pain medications or sleeping medications (Benzodaizepines)  Do not take more than prescribed Pain, Sleep and Anxiety Medications. It is not advisable to combine anxiety,sleep and pain medications without talking with  your primary care practitioner  Special Instructions: If you have smoked or chewed Tobacco  in the last 2 yrs please stop smoking, stop any regular Alcohol  and or any Recreational drug use.  Wear Seat belts while driving.  Please note: You were cared for by a hospitalist during your hospital stay. Once you are discharged, your primary care physician will handle any further medical issues. Please note that NO REFILLS for any discharge medications will be authorized once you are discharged, as it is imperative that you return to your primary care physician (or establish a relationship with a primary care physician if you do not have one) for your post hospital discharge needs so that they can reassess your need for medications and monitor your lab values.   Increase activity slowly   Complete by: As directed      Allergies as of 07/12/2020      Reactions   Other    Other reaction(s): Other (See Comments) SULFA-hallucinations   Sulfa Antibiotics Other (See Comments)   hallucinations      Medication List    STOP taking these medications   ibuprofen 200 MG tablet Commonly known as: ADVIL     TAKE these medications   acetaminophen 500 MG tablet Commonly known as: TYLENOL Take 1,000 mg by mouth every 6 (six) hours as needed for headache (pain).   albuterol  108 (90 Base) MCG/ACT inhaler Commonly known as: VENTOLIN HFA Inhale 2 puffs into the lungs 3 (three) times daily.   ALPRAZolam 0.25 MG tablet Commonly known as: XANAX Take 0.25 mg by mouth at bedtime as needed for anxiety or sleep.   BC HEADACHE POWDER PO Take 1 packet by mouth daily as needed (pain/headache).   betamethasone valerate ointment 0.1 % Commonly known as: VALISONE Apply 1 application topically 2 (two) times daily. For plaque psoriasis - apply to affected area and wrap with saran wrap   bismuth subsalicylate 262 MG/15ML suspension Commonly known as: PEPTO BISMOL Take 30 mLs by mouth every 6 (six) hours as  needed for indigestion or diarrhea or loose stools.   budesonide-formoterol 160-4.5 MCG/ACT inhaler Commonly known as: SYMBICORT Inhale 2 puffs into the lungs 2 (two) times daily.   CENTRUM SILVER 50+WOMEN PO Take 1 tablet by mouth daily.   cephALEXin 500 MG capsule Commonly known as: KEFLEX Take 1 capsule (500 mg total) by mouth every 8 (eight) hours.   cyclobenzaprine 10 MG tablet Commonly known as: FLEXERIL Take 1 tablet (10 mg total) by mouth 2 (two) times daily as needed for muscle spasms.   cycloSPORINE 0.05 % ophthalmic emulsion Commonly known as: RESTASIS Place 1 drop into both eyes daily.   dicyclomine 10 MG capsule Commonly known as: BENTYL Take 10 mg by mouth in the morning and at bedtime.   gabapentin 300 MG capsule Commonly known as: NEURONTIN Take 300 mg by mouth at bedtime.   Guselkumab 100 MG/ML Sopn Inject 100 mg into the skin See admin instructions. Every 8 weeks  Please resume only after talking with your primary care MD What changed: additional instructions   meclizine 25 MG tablet Commonly known as: ANTIVERT Take 25 mg by mouth 2 (two) times daily.   metoCLOPramide 5 MG tablet Commonly known as: REGLAN Take 5 mg by mouth 2 (two) times daily.   nitroGLYCERIN 0.4 MG SL tablet Commonly known as: NITROSTAT Place 1 tablet (0.4 mg total) under the tongue every 5 (five) minutes x 3 doses as needed for chest pain.   omeprazole 20 MG capsule Commonly known as: PRILOSEC Take 40 mg by mouth daily.   oxyCODONE 5 MG immediate release tablet Commonly known as: Oxy IR/ROXICODONE Take 1 tablet (5 mg total) by mouth every 6 (six) hours as needed for moderate pain (if pain is not resolved after tylenol).       Allergies  Allergen Reactions   Other     Other reaction(s): Other (See Comments) SULFA-hallucinations   Sulfa Antibiotics Other (See Comments)    hallucinations      Other Procedures/Studies: DG Chest 2 View  Result Date:  07/08/2020 CLINICAL DATA:  Chest pain EXAM: CHEST - 2 VIEW COMPARISON:  06/30/2018 FINDINGS: The heart size and mediastinal contours are within normal limits. Both lungs are clear. The visualized skeletal structures are unremarkable. IMPRESSION: No active cardiopulmonary disease. Electronically Signed   By: Duanne Guess D.O.   On: 07/08/2020 12:53   CT ABDOMEN PELVIS W CONTRAST  Result Date: 07/08/2020 CLINICAL DATA:  Bilateral flank pain, cp,sob.  LLQ abdominal pain. EXAM: CT ABDOMEN AND PELVIS WITH CONTRAST TECHNIQUE: Multidetector CT imaging of the abdomen and pelvis was performed using the standard protocol following bolus administration of intravenous contrast. CONTRAST:  80mL OMNIPAQUE IOHEXOL 300 MG/ML  SOLN COMPARISON:  CT abdomen pelvis 04/16/2018. CT urogram 10/27/2017, CT abdomen pelvis 09/08/2015 FINDINGS: Lower chest: Stable left lower lobe 5 mm micronodule. A  5 mm right lower lobe pulmonary nodule. Linear atelectasis versus scarring of the bases. Hepatobiliary: No focal liver abnormality. No gallstones, gallbladder wall thickening, or pericholecystic fluid. No biliary dilatation. Pancreas: No focal lesion. Normal pancreatic contour. No surrounding inflammatory changes. No main pancreatic ductal dilatation. Spleen: Normal in size without focal abnormality. Adrenals/Urinary Tract: No adrenal nodule bilaterally. Bilateral striated nephrograms. Bilateral renal cortical scarring. A 1.5 cm fluid density lesion within the right kidney likely represents a simple renal cyst. There is a 7 mm calcified stone within the left kidney. Punctate calcification within the inferior pole of the right kidney. No hydronephrosis. No hydroureter. The urinary bladder is unremarkable. On delayed imaging, there is no urothelial wall thickening and there are no filling defects in the opacified portions of the bilateral collecting systems or ureters. Stomach/Bowel: Stomach is within normal limits. No evidence of bowel wall  thickening or dilatation. Status post appendectomy. Vascular/Lymphatic: No abdominal aorta or iliac aneurysm. Mild calcified and noncalcified atherosclerotic plaque of the aorta and its branches. No abdominal, pelvic, or inguinal lymphadenopathy. Reproductive: Status post hysterectomy. No adnexal masses. Other: No intraperitoneal free fluid. No intraperitoneal free gas. No organized fluid collection. Musculoskeletal: No abdominal wall hernia or abnormality. No suspicious lytic or blastic osseous lesions. No acute displaced fracture. Multilevel degenerative changes of the spine. IMPRESSION: 1. Bilateral pyelonephritis with no abscess formation. 2. Nonobstructive 7 mm left and punctate right nephrolithiasis. 3. A possibly new 5 mm right lower lobe pulmonary nodule. No follow-up needed if patient is low-risk. Non-contrast chest CT can be considered in 12 months if patient is high-risk. This recommendation follows the consensus statement: Guidelines for Management of Incidental Pulmonary Nodules Detected on CT Images: From the Fleischner Society 2017; Radiology 2017; 284:228-243. 4.  Aortic Atherosclerosis (ICD10-I70.0). Electronically Signed   By: Tish Frederickson M.D.   On: 07/08/2020 19:16   US RENAL  Result Date: 07/08/2020 CLINICAL DATA:  Nephrolithiasis EXAM: RENAL / URINARY TRACT ULTRASOUND COMPLETE COMPARISON:  07/08/2020 CT FINDINGS: Right Kidney: Renal measurements: 8.2 x 3.9 x 3.6 cm. = volume: 60 mL. Minimal fullness of the renal pelvis is noted similar to that seen on prior CT examination. No true obstructive changes are noted. Left Kidney: Renal measurements: 8.3 x 4.2 x 3.5 cm. = volume: 64 mL. Known lower pole left renal calculi are not well appreciated on this exam. No hydronephrotic changes are seen. Bladder: Appears normal for degree of bladder distention. Other: None. IMPRESSION: No significant hydronephrotic changes are noted. Lower pole renal calculi on the left are not well appreciated on this  exam. Electronically Signed   By: Alcide Clever M.D.   On: 07/08/2020 21:48   DG Foot Complete Right  Result Date: 07/01/2020 Please see detailed radiograph report in office note.    TODAY-DAY OF DISCHARGE:  Subjective:   Whitney Parrish today has no headache,no chest abdominal pain,no new weakness tingling or numbness, feels much better wants to go home today.   Objective:   Blood pressure (!) 154/78, pulse (!) 58, temperature 98.6 F (37 C), temperature source Oral, resp. rate 18, height  (1.549 m), weight 54 kg, SpO2 99 %.  Intake/Output Summary (Last 24 hours) at 07/12/2020 0925 Last data filed at 07/12/2020 0500 Gross per 24 hour  Intake 840 ml  Output --  Net 840 ml   Filed Weights   07/08/20 1446  Weight: 54 kg    Exam: Awake Alert, Oriented *3, No new F.N deficits, Normal affect [place].AT,PERRAL Supple Neck,No JVD, No  cervical lymphadenopathy appriciated.  Symmetrical Chest wall movement, Good air movement bilaterally, CTAB RRR,No Gallops,Rubs or new Murmurs, No Parasternal Heave +ve B.Sounds, Abd Soft, Non tender, No organomegaly appriciated, No rebound -guarding or rigidity. No Cyanosis, Clubbing or edema, No new Rash or bruise   PERTINENT RADIOLOGIC STUDIES: No results found.   PERTINENT LAB RESULTS: CBC: Recent Labs    07/10/20 0106 07/11/20 0111  WBC 7.1 6.2  HGB 11.0* 10.3*  HCT 32.8* 31.8*  PLT 280 269   CMET CMP     Component Value Date/Time   NA 142 07/12/2020 0141   K 4.0 07/12/2020 0141   CL 112 (H) 07/12/2020 0141   CO2 23 07/12/2020 0141   GLUCOSE 120 (H) 07/12/2020 0141   BUN 8 07/12/2020 0141   CREATININE 1.50 (H) 07/12/2020 0141   CALCIUM 9.2 07/12/2020 0141   PROT 5.3 (L) 07/08/2020 1549   ALBUMIN 2.4 (L) 07/08/2020 1549   AST 17 07/08/2020 1549   ALT 8 07/08/2020 1549   ALKPHOS 43 07/08/2020 1549   BILITOT 1.0 07/08/2020 1549   GFRNONAA 38 (L) 07/12/2020 0141   GFRAA 29 (L) 04/16/2018 1750    GFR Estimated Creatinine  Clearance: 28.2 mL/min (A) (by C-G formula based on SCr of 1.5 mg/dL (H)). No results for input(s): LIPASE, AMYLASE in the last 72 hours. No results for input(s): CKTOTAL, CKMB, CKMBINDEX, TROPONINI in the last 72 hours. Invalid input(s): POCBNP No results for input(s): DDIMER in the last 72 hours. No results for input(s): HGBA1C in the last 72 hours. No results for input(s): CHOL, HDL, LDLCALC, TRIG, CHOLHDL, LDLDIRECT in the last 72 hours. No results for input(s): TSH, T4TOTAL, T3FREE, THYROIDAB in the last 72 hours.  Invalid input(s): FREET3 No results for input(s): VITAMINB12, FOLATE, FERRITIN, TIBC, IRON, RETICCTPCT in the last 72 hours. Coags: No results for input(s): INR in the last 72 hours.  Invalid input(s): PT Microbiology: Recent Results (from the past 240 hour(s))  SARS CORONAVIRUS 2 (TAT 6-24 HRS) Nasopharyngeal Nasopharyngeal Swab     Status: None   Collection Time: 07/05/20  1:43 PM   Specimen: Nasopharyngeal Swab  Result Value Ref Range Status   SARS Coronavirus 2 NEGATIVE NEGATIVE Final    Comment: (NOTE) SARS-CoV-2 target nucleic acids are NOT DETECTED.  The SARS-CoV-2 RNA is generally detectable in upper and lower respiratory specimens during the acute phase of infection. Negative results do not preclude SARS-CoV-2 infection, do not rule out co-infections with other pathogens, and should not be used as the sole basis for treatment or other patient management decisions. Negative results must be combined with clinical observations, patient history, and epidemiological information. The expected result is Negative.  Fact Sheet for Patients: HairSlick.no  Fact Sheet for Healthcare Providers: quierodirigir.com  This test is not yet approved or cleared by the [place] FDA and  has been authorized for detection and/or diagnosis of SARS-CoV-2 by FDA under an Emergency Use Authorization (EUA). This EUA will  remain  in effect (meaning this test can be used) for the duration of the COVID-19 declaration under Se ction 564(b)(1) of the Act, 21 U.S.C. section 360bbb-3(b)(1), unless the authorization is terminated or revoked sooner.  Performed at [facility], [address], [place], [place] [address]   Urine culture     Status: Abnormal   Collection Time: 07/08/20  7:13 PM   Specimen: Urine, Random  Result Value Ref Range Status   Specimen Description URINE, RANDOM  Final   Special Requests  Final    NONE Performed at [facility], [address] [place], [place] [address]    Culture >=100,000 COLONIES/mL PROTEUS MIRABILIS (A)  Final   Report Status 07/11/2020 FINAL  Final   Organism ID, Bacteria PROTEUS MIRABILIS (A)  Final      Susceptibility   Proteus mirabilis - MIC*    AMPICILLIN <=2 SENSITIVE Sensitive     CEFAZOLIN <=4 SENSITIVE Sensitive     CEFEPIME <=0.12 SENSITIVE Sensitive     CEFTRIAXONE <=0.25 SENSITIVE Sensitive     CIPROFLOXACIN <=0.25 SENSITIVE Sensitive     GENTAMICIN <=1 SENSITIVE Sensitive     IMIPENEM 1 SENSITIVE Sensitive     NITROFURANTOIN RESISTANT Resistant     TRIMETH/SULFA <=20 SENSITIVE Sensitive     AMPICILLIN/SULBACTAM <=2 SENSITIVE Sensitive     PIP/TAZO <=4 SENSITIVE Sensitive     * >=100,000 COLONIES/mL PROTEUS MIRABILIS  SARS CORONAVIRUS 2 (TAT 6-24 HRS) Nasopharyngeal Nasopharyngeal Swab     Status: None   Collection Time: 07/08/20  8:13 PM   Specimen: Nasopharyngeal Swab  Result Value Ref Range Status   SARS Coronavirus 2 NEGATIVE NEGATIVE Final    Comment: (NOTE) SARS-CoV-2 target nucleic acids are NOT DETECTED.  The SARS-CoV-2 RNA is generally detectable in upper and lower respiratory specimens during the acute phase of infection. Negative results do not preclude SARS-CoV-2 infection, do not rule out co-infections with other pathogens, and should not be used as the sole basis for treatment or other patient management  decisions. Negative results must be combined with clinical observations, patient history, and epidemiological information. The expected result is Negative.  Fact Sheet for Patients: HairSlick.nohttps://www.fda.gov/media/138098/download  Fact Sheet for Healthcare Providers: quierodirigir.comhttps://www.fda.gov/media/138095/download  This test is not yet approved or cleared by the [place] FDA and  has been authorized for detection and/or diagnosis of SARS-CoV-2 by FDA under an Emergency Use Authorization (EUA). This EUA will remain  in effect (meaning this test can be used) for the duration of the COVID-19 declaration under Se ction 564(b)(1) of the Act, 21 U.S.C. section 360bbb-3(b)(1), unless the authorization is terminated or revoked sooner.  Performed at [facility], [address] [place], [place] [address]   Culture, blood (routine x 2)     Status: None (Preliminary result)   Collection Time: 07/09/20 12:42 AM   Specimen: BLOOD RIGHT HAND  Result Value Ref Range Status   Specimen Description BLOOD RIGHT HAND  Final   Special Requests   Final    BOTTLES DRAWN AEROBIC AND ANAEROBIC Blood Culture results may not be optimal due to an excessive volume of blood received in culture bottles   Culture   Final    NO GROWTH 2 DAYS Performed at [facility], [address] [place], [place] [address]    Report Status PENDING  Incomplete  Culture, blood (routine x 2)     Status: None (Preliminary result)   Collection Time: 07/09/20 12:42 AM   Specimen: BLOOD LEFT HAND  Result Value Ref Range Status   Specimen Description BLOOD LEFT HAND  Final   Special Requests   Final    BOTTLES DRAWN AEROBIC AND ANAEROBIC Blood Culture adequate volume   Culture   Final    NO GROWTH 2 DAYS Performed at [facility], [address] [place], [place] [address]    Report Status PENDING  Incomplete    FURTHER DISCHARGE INSTRUCTIONS:  Get Medicines reviewed and adjusted: Please take all your  medications  with you for your next visit with your Primary MD  Laboratory/radiological data: Please request your Primary MD to go over all hospital tests and procedure/radiological results at the follow up, please ask your Primary MD to get all Hospital records sent to his/her office.  In some cases, they will be blood work, cultures and biopsy results pending at the time of your discharge. Please request that your primary care M.D. goes through all the records of your hospital data and follows up on these results.  Also Note the following: If you experience worsening of your admission symptoms, develop shortness of breath, life threatening emergency, suicidal or homicidal thoughts you must seek medical attention immediately by calling 911 or calling your MD immediately  if symptoms less severe.  You must read complete instructions/literature along with all the possible adverse reactions/side effects for all the Medicines you take and that have been prescribed to you. Take any new Medicines after you have completely understood and accpet all the possible adverse reactions/side effects.   Do not drive when taking Pain medications or sleeping medications (Benzodaizepines)  Do not take more than prescribed Pain, Sleep and Anxiety Medications. It is not advisable to combine anxiety,sleep and pain medications without talking with your primary care practitioner  Special Instructions: If you have smoked or chewed Tobacco  in the last 2 yrs please stop smoking, stop any regular Alcohol  and or any Recreational drug use.  Wear Seat belts while driving.  Please note: You were cared for by a hospitalist during your hospital stay. Once you are discharged, your primary care physician will handle any further medical issues. Please note that NO REFILLS for any discharge medications will be authorized once you are discharged, as it is imperative that you return to your primary care physician (or establish a  relationship with a primary care physician if you do not have one) for your post hospital discharge needs so that they can reassess your need for medications and monitor your lab values.  Total Time spent coordinating discharge including counseling, education and face to face time equals 35 minutes.  SignedJeoffrey Massed 07/12/2020 9:25 AM

## 2020-07-12 NOTE — Progress Notes (Signed)
Nsg Discharge Note  Admit Date:  07/08/2020 Discharge date: 07/12/2020   Whitney Parrish to be D/C'd Home per MD order.  AVS completed.  Copy for chart, and copy for patient signed, and dated. Patient/caregiver able to verbalize understanding.  Discharge Medication: Allergies as of 07/12/2020      Reactions   Other    Other reaction(s): Other (See Comments) SULFA-hallucinations   Sulfa Antibiotics Other (See Comments)   hallucinations      Medication List    STOP taking these medications   ibuprofen 200 MG tablet Commonly known as: ADVIL     TAKE these medications   acetaminophen 500 MG tablet Commonly known as: TYLENOL Take 1,000 mg by mouth every 6 (six) hours as needed for headache (pain).   albuterol 108 (90 Base) MCG/ACT inhaler Commonly known as: VENTOLIN HFA Inhale 2 puffs into the lungs 3 (three) times daily.   ALPRAZolam 0.25 MG tablet Commonly known as: XANAX Take 0.25 mg by mouth at bedtime as needed for anxiety or sleep.   BC HEADACHE POWDER PO Take 1 packet by mouth daily as needed (pain/headache).   betamethasone valerate ointment 0.1 % Commonly known as: VALISONE Apply 1 application topically 2 (two) times daily. For plaque psoriasis - apply to affected area and wrap with saran wrap   bismuth subsalicylate 262 MG/15ML suspension Commonly known as: PEPTO BISMOL Take 30 mLs by mouth every 6 (six) hours as needed for indigestion or diarrhea or loose stools.   budesonide-formoterol 160-4.5 MCG/ACT inhaler Commonly known as: SYMBICORT Inhale 2 puffs into the lungs 2 (two) times daily.   CENTRUM SILVER 50+WOMEN PO Take 1 tablet by mouth daily.   cephALEXin 500 MG capsule Commonly known as: KEFLEX Take 1 capsule (500 mg total) by mouth every 8 (eight) hours.   cyclobenzaprine 10 MG tablet Commonly known as: FLEXERIL Take 1 tablet (10 mg total) by mouth 2 (two) times daily as needed for muscle spasms.   cycloSPORINE 0.05 % ophthalmic  emulsion Commonly known as: RESTASIS Place 1 drop into both eyes daily.   dicyclomine 10 MG capsule Commonly known as: BENTYL Take 10 mg by mouth in the morning and at bedtime.   gabapentin 300 MG capsule Commonly known as: NEURONTIN Take 300 mg by mouth at bedtime.   Guselkumab 100 MG/ML Sopn Inject 100 mg into the skin See admin instructions. Every 8 weeks  Please resume only after talking with your primary care MD What changed: additional instructions   meclizine 25 MG tablet Commonly known as: ANTIVERT Take 25 mg by mouth 2 (two) times daily.   metoCLOPramide 5 MG tablet Commonly known as: REGLAN Take 5 mg by mouth 2 (two) times daily.   nitroGLYCERIN 0.4 MG SL tablet Commonly known as: NITROSTAT Place 1 tablet (0.4 mg total) under the tongue every 5 (five) minutes x 3 doses as needed for chest pain.   omeprazole 20 MG capsule Commonly known as: PRILOSEC Take 40 mg by mouth daily.   oxyCODONE 5 MG immediate release tablet Commonly known as: Oxy IR/ROXICODONE Take 1 tablet (5 mg total) by mouth every 6 (six) hours as needed for moderate pain (if pain is not resolved after tylenol).       Discharge Assessment: Vitals:   07/11/20 1930 07/12/20 0451  BP:  (!) 154/78  Pulse:  (!) 58  Resp:  18  Temp:  98.6 F (37 C)  SpO2: 98% 99%   Skin clean, dry and intact without evidence of skin  break down, no evidence of skin tears noted. IV catheter discontinued intact. Site without signs and symptoms of complications - no redness or edema noted at insertion site, patient denies c/o pain - only slight tenderness at site.  Dressing with slight pressure applied.  D/c Instructions-Education: Discharge instructions given to patient/family with verbalized understanding. D/c education completed with patient/family including follow up instructions, medication list, d/c activities limitations if indicated, with other d/c instructions as indicated by MD - patient able to verbalize  understanding, all questions fully answered. Patient instructed to return to ED, call 911, or call MD for any changes in condition.  Patient escorted via WC, and D/C home via private auto.  Boykin Nearing, RN 07/12/2020 10:29 AM

## 2020-07-14 LAB — CULTURE, BLOOD (ROUTINE X 2)
Culture: NO GROWTH
Culture: NO GROWTH
Special Requests: ADEQUATE

## 2020-08-01 ENCOUNTER — Other Ambulatory Visit: Payer: Self-pay | Admitting: Cardiovascular Disease

## 2020-08-01 ENCOUNTER — Ambulatory Visit
Admission: RE | Admit: 2020-08-01 | Discharge: 2020-08-01 | Disposition: A | Discharge status: Home / Self Care | Payer: Medicare Other | Source: Ambulatory Visit | Attending: Cardiovascular Disease | Admitting: Cardiovascular Disease

## 2020-08-01 ENCOUNTER — Other Ambulatory Visit: Payer: Self-pay

## 2020-08-01 DIAGNOSIS — R1012 Left upper quadrant pain: Secondary | ICD-10-CM

## 2021-11-05 ENCOUNTER — Other Ambulatory Visit: Payer: Self-pay

## 2021-11-05 ENCOUNTER — Encounter (HOSPITAL_BASED_OUTPATIENT_CLINIC_OR_DEPARTMENT_OTHER): Payer: Self-pay | Admitting: Orthopaedic Surgery

## 2021-11-11 MED ORDER — CEFAZOLIN SODIUM-DEXTROSE 2-4 GM/100ML-% IV SOLN: 2.0000 g | INTRAVENOUS | Status: AC

## 2021-11-11 NOTE — H&P (Signed)
PREOPERATIVE H&P  Chief Complaint: right shoulder cartilage disorder,impingement syndrome, OA, bicep tendinitis  HPI: Whitney Parrish is a 66 y.o. female who is scheduled for, Procedure(s): SHOULDER ARTHROSCOPY WITH SUBACROMIAL DECOMPRESSION, ROTATOR CUFF REPAIR AND BICEP TENDON REPAIR.   Patient has a past medical history significant for CKD stage 3, GERD, HTN, psoriasis.   The patient has had atypical right shoulder pain for about two years.  She had concern for adhesive capsulitis.  She continues to be limited.  She is not able to move well.  She is frustrated by her pain and wants to talk about what her options are.  Of note, she has gotten temporary relief with injection in the past.  She smokes  pack per day.  She uses gabapentin for pain control.    Symptoms are rated as moderate to severe, and have been worsening.  This is significantly impairing activities of daily living.    Please see clinic note for further details on this patient's care.    She has elected for surgical management.   Past Medical History:  Diagnosis Date   Anxiety    Arthritis    shoulders and hands   Asthma    Benign positional vertigo    Chronically dry eyes    CKD (chronic kidney disease), stage III (HCC)    Depression    Dyspnea on exertion    GERD (gastroesophageal reflux disease)    Hematuria    History of acute pyelonephritis    History of Bell's palsy    1980's left side-- residual lip numbness intermittant   History of chest pain    non-cardiac per dr Algie Coffer notes (pt's pcp)   History of panic attacks    Hypertension    Left ureteral stone    Loose bowel movements    chronic-per pt residual from injury during abdominal surgery in 1986   Migraines    Nephrolithiasis    left    Psoriasis    Urgency of urination    Wears dentures    has upper and lower but only wears upper   Past Surgical History:  Procedure Laterality Date   BREAST CYST EXCISION  12/15/2011   Procedure:  CYST EXCISION BREAST;  Surgeon: Shelly Rubenstein, MD;  Location: [facility];  Service: General;  Laterality: Left;  Excision of a Chronic Left Breast Sebaceous Cyst   CARDIOVASCULAR STRESS TEST  12-28-2012   Low risk nuclear study w/ small LV cavity which may be due LVH/  no ischemia or infarct/  normal wall motion, ef 67%   CYSTOSCOPY WITH STENT PLACEMENT Left 09/08/2015   Procedure: CYSTOSCOPY WITH LEFT STENT PLACEMENT;  Surgeon: Bjorn Pippin, MD;  Location: [facility];  Service: Urology;  Laterality: Left;   CYSTOSCOPY WITH URETEROSCOPY AND STENT PLACEMENT Left 09/25/2015   Procedure: LEFT  URETEROSCOPY STONE EXTRACTION;  Surgeon: Bjorn Pippin, MD;  Location: [facility];  Service: Urology;  Laterality: Left;   EXCISION AND REVISION SCAR ABDOMINAL WALL  01-22-2002   EXPLORATORY LAPAROTOMY  1986   w/ Abdominal Myomectomy  (per pt bowel muscle cut (injury))   HOLMIUM LASER APPLICATION Left 09/25/2015   Procedure: HOLMIUM LASER APPLICATION;  Surgeon: Bjorn Pippin, MD;  [facility];  Service: Urology;  Laterality: Left;   I & D LEFT BUTTOCK ABSCESS   07-01-2010   x2 area's   NEGATIVE SLEEP STUDY  08-10-2005   TRANSTHORACIC ECHOCARDIOGRAM  12-28-2012   grade 1 diastolic dysfunction,  mild concentric LVH, ef 65-70%/  mild MR/  trivial PR and TR   Social History   Socioeconomic History   Marital status: Divorced    Spouse name: Not on file   Number of children: Not on file   Years of education: Not on file   Highest education level: Not on file  Occupational History   Not on file  Tobacco Use   Smoking status: Every Day    Packs/day: 0.25    Years: 30.00    Total pack years: 7.50    Types: Cigarettes   Smokeless tobacco: Never   Tobacco comments:    " QUITTING ON HER OWN "  down to 5 cig daily  Substance and Sexual Activity   Alcohol use: No   Drug use: No   Sexual activity: Not on file  Other Topics Concern   Not on file  Social History Narrative   Not  on file   Social Determinants of Health   Financial Resource Strain: Not on file  Food Insecurity: Not on file  Transportation Needs: Not on file  Physical Activity: Not on file  Stress: Not on file  Social Connections: Not on file   Family History  Problem Relation Age of Onset   Cancer Mother        Stomach   Cancer Father        Unknown   Allergies  Allergen Reactions   Sulfa Antibiotics Other (See Comments)    hallucinations   Prior to Admission medications   Medication Sig Start Date End Date Taking? Authorizing Provider  albuterol (PROVENTIL HFA;VENTOLIN HFA) 108 (90 Base) MCG/ACT inhaler Inhale 2 puffs into the lungs 3 (three) times daily.   Yes [provider]  ALPRAZolam (XANAX) 0.25 MG tablet Take 0.25 mg by mouth at bedtime as needed for anxiety or sleep.    Yes [provider]  amLODipine (NORVASC) 2.5 MG tablet Take 2.5 mg by mouth daily.   Yes [provider]  budesonide-formoterol (SYMBICORT) 160-4.5 MCG/ACT inhaler Inhale 2 puffs into the lungs 2 (two) times daily.   Yes [provider]  cycloSPORINE (RESTASIS) 0.05 % ophthalmic emulsion Place 1 drop into both eyes daily.   Yes [provider]  dicyclomine (BENTYL) 10 MG capsule Take 10 mg by mouth in the morning and at bedtime.   Yes [provider]  gabapentin (NEURONTIN) 300 MG capsule Take 300 mg by mouth at bedtime. 06/26/19  Yes [provider]  meclizine (ANTIVERT) 25 MG tablet Take 25 mg by mouth 2 (two) times daily.   Yes [provider]  Multiple Vitamins-Minerals (CENTRUM SILVER 50+WOMEN PO) Take 1 tablet by mouth daily.   Yes [provider]  omeprazole (PRILOSEC) 20 MG capsule Take 40 mg by mouth daily. 06/07/20  Yes [provider]  Guselkumab 100 MG/ML SOPN Inject 100 mg into the skin See admin instructions. Every 8 weeks  Please resume only after talking with your primary care MD 07/12/20   Maretta Bees, MD   nitroGLYCERIN (NITROSTAT) 0.4 MG SL tablet Place 1 tablet (0.4 mg total) under the tongue every 5 (five) minutes x 3 doses as needed for chest pain. Patient taking differently: Place 0.4 mg under the tongue every 5 (five) minutes x 3 doses as needed for chest pain. Per patient hasn't used in over a year 12/29/12   [name], MD    ROS: All other systems have been reviewed and were otherwise negative with the  exception of those mentioned in the HPI and as above.  Physical Exam: General: Alert, no acute distress Cardiovascular: No pedal edema Respiratory: No cyanosis, no use of accessory musculature GI: No organomegaly, abdomen is soft and non-tender Skin: No lesions in the area of chief complaint Neurologic: Sensation intact distally Psychiatric: Patient is competent for consent with normal mood and affect Lymphatic: No axillary or cervical lymphadenopathy  MUSCULOSKELETAL:  Range of motion of the shoulder actively to about 30.  Passive to about 60, limited by patient pain and cooperation with testing.  External rotation to 60 versus 70.  Tender to palpation about the [facility] joint.  Impingement and O'Brien's test is grossly positive.    Imaging: Right shoulder MRI demonstrates atypical leading edge portion of the supraspinatus with thickening and possible articular-sided tearing.  There is significant fluid around the biceps.  Type II acromion.  AC arthrosis.  Subacromial fluid.    Assessment: right shoulder cartilage disorder,impingement syndrome, OA, bicep tendinitis  Plan: Plan for Procedure(s): SHOULDER ARTHROSCOPY WITH SUBACROMIAL DECOMPRESSION, ROTATOR CUFF REPAIR AND BICEP TENDON REPAIR   The risks benefits and alternatives were discussed with the patient including but not limited to the risks of nonoperative treatment, versus surgical intervention including infection, bleeding, nerve injury,  blood clots, cardiopulmonary complications, morbidity, mortality, among others, and  they were willing to proceed.   The patient acknowledged the explanation, agreed to proceed with the plan and consent was signed.   She received operative clearance from her PCP, Dr. Algie Coffer.   Operative Plan: Right shoulder scope with subacromial decompression, biceps tenodesis, distal clavicle excision and possible cuff repair Discharge Medications: Standard DVT Prophylaxis: None Physical Therapy: Outpatient PT - 7/17 @4PM  Special Discharge needs: Sling. IceMan   , PA-C  11/11/2021 9:16 AM

## 2021-11-11 NOTE — Discharge Instructions (Addendum)
No TYLENOL products till after 4pm today   Ramond Marrow MD, MPH Alfonse Alpers, PA-C [facility] [address] [phone] (tel)   [phone] (fax)   POST-OPERATIVE INSTRUCTIONS - SHOULDER ARTHROSCOPY  WOUND CARE You may remove the Operative Dressing on Post-Op Day #3 (72hrs after surgery).   Alternatively if you would like you can leave dressing on until follow-up if within 7-8 days but keep it dry. Leave steri-strips in place until they fall off on their own, usually 2 weeks postop. There may be a small amount of fluid/bleeding leaking at the surgical site.  This is normal; the shoulder is filled with fluid during the procedure and can leak for 24-48hrs after surgery.  You may change/reinforce the bandage as needed.  Use the Cryocuff or Ice as often as possible for the first 7 days, then as needed for pain relief. Always keep a towel, ACE wrap or other barrier between the cooling unit and your skin.  You may shower on Post-Op Day #3. Gently pat the area dry.  Do not soak the shoulder in water or submerge it. Keep dry incisions as dry as possible. Do not go swimming in the pool or ocean until 4 weeks after surgery or when otherwise instructed.    EXERCISES Wear the sling at all times  You may remove the sling for showering, but keep the arm across the chest or in a secondary sling.     It is normal for your fingers/hand to become more swollen after surgery and discolored from bruising.   This will resolve over the first few weeks usually after surgery. Please continue to ambulate and do not stay sitting or lying for too long.  Perform foot and wrist pumps to assist in circulation.  PHYSICAL THERAPY - You will begin physical therapy soon after surgery (unless otherwise specified) - Please call to set up an appointment, if you do not already have one  - Let our office if there are any issues with scheduling your therapy  - You have a physical  therapy appointment scheduled at [facility] (across the hall from our office) on Monday, July 17th at 4 pm   REGIONAL ANESTHESIA (NERVE BLOCKS) The anesthesia team may have performed a nerve block for you this is a great tool used to minimize pain.   The block may start wearing off overnight (between 8-24 hours postop) When the block wears off, your pain may go from nearly zero to the pain you would have had postop without the block. This is an abrupt transition but nothing dangerous is happening.   This can be a challenging period but utilize your as needed pain medications to try and manage this period. We suggest you use the pain medication the first night prior to going to bed, to ease this transition.  You may take an extra dose of narcotic when this happens if needed  POST-OP MEDICATIONS- Multimodal approach to pain control In general your pain will be controlled with a combination of substances.  Prescriptions unless otherwise discussed are electronically sent to your pharmacy.  This is a carefully made plan we use to minimize narcotic use.     Diclofenac - Anti-inflammatory medication taken on a scheduled basis Acetaminophen - Non-narcotic pain medicine taken on a scheduled basis  Oxycodone - This is a strong narcotic, to be used only on an "as needed" basis for SEVERE pain.  Zofran - take as needed for nausea  FOLLOW-UP If you  develop a Fever (?101.5), Redness or Drainage from the surgical incision site, please call our office to arrange for an evaluation. Please call the office to schedule a follow-up appointment for your first post-operative appointment, 7-10 days post-operatively.    HELPFUL INFORMATION   You may be more comfortable sleeping in a semi-seated position the first few nights following surgery.  Keep a pillow propped under the elbow and forearm for comfort.  If you have a recliner type of chair it might be beneficial.  If not that is fine too, but it would be helpful  to sleep propped up with pillows behind your operated shoulder as well under your elbow and forearm.  This will reduce pulling on the suture lines.  When dressing, put your operative arm in the sleeve first.  When getting undressed, take your operative arm out last.  Loose fitting, button-down shirts are recommended.  Often in the first days after surgery you may be more comfortable keeping your operative arm under your shirt and not through the sleeve.  You may return to work/school in the next couple of days when you feel up to it.  Desk work and typing in the sling is fine.  We suggest you use the pain medication the first night prior to going to bed, in order to ease any pain when the anesthesia wears off. You should avoid taking pain medications on an empty stomach as it will make you nauseous.  You should wean off your narcotic medicines as soon as you are able.  Most patients will be off or using minimal narcotics before their first postop appointment.   Do not drink alcoholic beverages or take illicit drugs when taking pain medications.  It is against the law to drive while taking narcotics.  In some states it is against the law to drive while your arm is in a sling.   Pain medication may make you constipated.  Below are a few solutions to try in this order: Decrease the amount of pain medication if you aren't having pain. Drink lots of decaffeinated fluids. Drink prune juice and/or eat dried prunes  If the first 3 don't work start with additional solutions Take Colace - an over-the-counter stool softener Take Senokot - an over-the-counter laxative Take Miralax - a stronger over-the-counter laxative  For more information including helpful videos and documents visit our website:   [email or web address]       Post Anesthesia Home Care Instructions  Activity: Get plenty of rest for the remainder of the day. A responsible individual must stay  with you for 24 hours following the procedure.  For the next 24 hours, DO NOT: -Drive a car -Advertising copywriter -Drink alcoholic beverages -Take any medication unless instructed by your physician -Make any legal decisions or sign important papers.  Meals: Start with liquid foods such as gelatin or soup. Progress to regular foods as tolerated. Avoid greasy, spicy, heavy foods. If nausea and/or vomiting occur, drink only clear liquids until the nausea and/or vomiting subsides. Call your physician if vomiting continues.  Special Instructions/Symptoms: Your throat may feel dry or sore from the anesthesia or the breathing tube placed in your throat during surgery. If this causes discomfort, gargle with warm salt water. The discomfort should disappear within 24 hours.  If you had a scopolamine patch placed behind your ear for the management of post- operative nausea and/or vomiting:  1. The medication in the patch is effective for 72 hours, after which it should be removed.  Wrap patch in a tissue and discard in the trash. Wash hands thoroughly with soap and water. 2. You may remove the patch earlier than 72 hours if you experience unpleasant side effects which may include dry mouth, dizziness or visual disturbances. 3. Avoid touching the patch. Wash your hands with soap and water after contact with the patch.  Regional Anesthesia Blocks  1. Numbness or the inability to move the "blocked" extremity may last from 3-48 hours after placement. The length of time depends on the medication injected and your individual response to the medication. If the numbness is not going away after 48 hours, call your surgeon.  2. The extremity that is blocked will need to be protected until the numbness is gone and the  Strength has returned. Because you cannot feel it, you will need to take extra care to avoid injury. Because it may be weak, you may have difficulty moving it or using it. You may not know what  position it is in without looking at it while the block is in effect.  3. For blocks in the legs and feet, returning to weight bearing and walking needs to be done carefully. You will need to wait until the numbness is entirely gone and the strength has returned. You should be able to move your leg and foot normally before you try and bear weight or walk. You will need someone to be with you when you first try to ensure you do not fall and possibly risk injury.  4. Bruising and tenderness at the needle site are common side effects and will resolve in a few days.  5. Persistent numbness or new problems with movement should be communicated to the surgeon or the [facility] [phone] [facility] [phone]).   Call your surgeon if you experience:   1.  Fever over 101.0. 2.  Inability to urinate. 3.  Nausea and/or vomiting. 4.  Extreme swelling or bruising at the surgical site. 5.  Continued bleeding from the incision. 6.  Increased pain, redness or drainage from the incision. 7.  Problems related to your pain medication. 8.  Any problems and/or concerns     Donjoy Ultrasling III (Red ball):  Please contact your surgeon if you have questions or concerns about your sling.

## 2021-11-12 ENCOUNTER — Ambulatory Visit (HOSPITAL_BASED_OUTPATIENT_CLINIC_OR_DEPARTMENT_OTHER): Payer: Medicare Other | Admitting: Certified Registered"

## 2021-11-12 ENCOUNTER — Other Ambulatory Visit: Payer: Self-pay

## 2021-11-12 ENCOUNTER — Encounter (HOSPITAL_BASED_OUTPATIENT_CLINIC_OR_DEPARTMENT_OTHER)
Admission: RE | Disposition: A | Discharge status: Home / Self Care | Payer: Self-pay | Source: Ambulatory Visit | Attending: Orthopaedic Surgery

## 2021-11-12 ENCOUNTER — Encounter (HOSPITAL_BASED_OUTPATIENT_CLINIC_OR_DEPARTMENT_OTHER): Payer: Self-pay | Admitting: Orthopaedic Surgery

## 2021-11-12 ENCOUNTER — Ambulatory Visit (HOSPITAL_BASED_OUTPATIENT_CLINIC_OR_DEPARTMENT_OTHER)
Admission: RE | Admit: 2021-11-12 | Discharge: 2021-11-12 | Disposition: A | Discharge status: Home / Self Care | Payer: Medicare Other | Source: Ambulatory Visit | Attending: Orthopaedic Surgery | Admitting: Orthopaedic Surgery

## 2021-11-12 DIAGNOSIS — N183 Chronic kidney disease, stage 3 unspecified: Secondary | ICD-10-CM | POA: Insufficient documentation

## 2021-11-12 DIAGNOSIS — Z79899 Other long term (current) drug therapy: Secondary | ICD-10-CM | POA: Insufficient documentation

## 2021-11-12 DIAGNOSIS — M19011 Primary osteoarthritis, right shoulder: Secondary | ICD-10-CM | POA: Insufficient documentation

## 2021-11-12 DIAGNOSIS — K219 Gastro-esophageal reflux disease without esophagitis: Secondary | ICD-10-CM | POA: Insufficient documentation

## 2021-11-12 DIAGNOSIS — L409 Psoriasis, unspecified: Secondary | ICD-10-CM | POA: Insufficient documentation

## 2021-11-12 DIAGNOSIS — M7541 Impingement syndrome of right shoulder: Secondary | ICD-10-CM | POA: Diagnosis not present

## 2021-11-12 DIAGNOSIS — J45909 Unspecified asthma, uncomplicated: Secondary | ICD-10-CM | POA: Diagnosis not present

## 2021-11-12 DIAGNOSIS — I5030 Unspecified diastolic (congestive) heart failure: Secondary | ICD-10-CM | POA: Diagnosis not present

## 2021-11-12 DIAGNOSIS — I130 Hypertensive heart and chronic kidney disease with heart failure and stage 1 through stage 4 chronic kidney disease, or unspecified chronic kidney disease: Secondary | ICD-10-CM | POA: Diagnosis not present

## 2021-11-12 DIAGNOSIS — F418 Other specified anxiety disorders: Secondary | ICD-10-CM | POA: Insufficient documentation

## 2021-11-12 DIAGNOSIS — Z01818 Encounter for other preprocedural examination: Secondary | ICD-10-CM

## 2021-11-12 DIAGNOSIS — M948X1 Other specified disorders of cartilage, shoulder: Secondary | ICD-10-CM | POA: Diagnosis not present

## 2021-11-12 DIAGNOSIS — M7521 Bicipital tendinitis, right shoulder: Secondary | ICD-10-CM | POA: Diagnosis not present

## 2021-11-12 DIAGNOSIS — F17210 Nicotine dependence, cigarettes, uncomplicated: Secondary | ICD-10-CM | POA: Insufficient documentation

## 2021-11-12 HISTORY — DX: Gastro-esophageal reflux disease without esophagitis: K21.9

## 2021-11-12 HISTORY — DX: Essential (primary) hypertension: I10

## 2021-11-12 HISTORY — PX: SHOULDER ARTHROSCOPY WITH SUBACROMIAL DECOMPRESSION, ROTATOR CUFF REPAIR AND BICEP TENDON REPAIR: SHX5687

## 2021-11-12 SURGERY — SHOULDER ARTHROSCOPY WITH SUBACROMIAL DECOMPRESSION, ROTATOR CUFF REPAIR AND BICEP TENDON REPAIR
Anesthesia: Regional | Site: Shoulder | Laterality: Right

## 2021-11-12 MED ORDER — ACETAMINOPHEN 500 MG PO TABS
1000.0000 mg | ORAL_TABLET | Freq: Once | ORAL | Status: AC
Start: 2021-11-12 — End: 2021-11-12
  Administered 2021-11-12: 1000 mg via ORAL

## 2021-11-12 MED ORDER — SODIUM CHLORIDE (PF) 0.9 % IJ SOLN
INTRAMUSCULAR | Status: DC | PRN
  Administered 2021-11-12 (×4): 3000 mL

## 2021-11-12 MED ORDER — OXYCODONE HCL 5 MG PO TABS: ORAL_TABLET | ORAL | 0 refills | Status: AC

## 2021-11-12 MED ORDER — LACTATED RINGERS IV SOLN: INTRAVENOUS | Status: DC

## 2021-11-12 MED ORDER — ROPIVACAINE HCL 5 MG/ML IJ SOLN
INTRAMUSCULAR | Status: DC | PRN
  Administered 2021-11-12: 15 mL via PERINEURAL

## 2021-11-12 MED ORDER — ACETAMINOPHEN 500 MG PO TABS: 1000.0000 mg | ORAL_TABLET | Freq: Three times a day (TID) | ORAL | 0 refills | Status: AC

## 2021-11-12 MED ORDER — PHENYLEPHRINE 80 MCG/ML (10ML) SYRINGE FOR IV PUSH (FOR BLOOD PRESSURE SUPPORT)
PREFILLED_SYRINGE | INTRAVENOUS | Status: AC
  Filled 2021-11-12: qty 10

## 2021-11-12 MED ORDER — PHENYLEPHRINE HCL (PRESSORS) 10 MG/ML IV SOLN
INTRAVENOUS | Status: AC
  Filled 2021-11-12: qty 2

## 2021-11-12 MED ORDER — AMISULPRIDE (ANTIEMETIC) 5 MG/2ML IV SOLN
10.0000 mg | Freq: Once | INTRAVENOUS | Status: AC | PRN
  Administered 2021-11-12: 10 mg via INTRAVENOUS

## 2021-11-12 MED ORDER — FENTANYL CITRATE (PF) 100 MCG/2ML IJ SOLN
INTRAMUSCULAR | Status: AC
  Filled 2021-11-12: qty 2

## 2021-11-12 MED ORDER — BUPIVACAINE LIPOSOME 1.3 % IJ SUSP
INTRAMUSCULAR | Status: DC | PRN
  Administered 2021-11-12: 10 mL via PERINEURAL

## 2021-11-12 MED ORDER — GABAPENTIN 300 MG PO CAPS
300.0000 mg | ORAL_CAPSULE | Freq: Once | ORAL | Status: AC
  Administered 2021-11-12: 300 mg via ORAL

## 2021-11-12 MED ORDER — FENTANYL CITRATE (PF) 100 MCG/2ML IJ SOLN
50.0000 ug | Freq: Once | INTRAMUSCULAR | Status: AC
  Administered 2021-11-12: 50 ug via INTRAVENOUS

## 2021-11-12 MED ORDER — LIDOCAINE 2% (20 MG/ML) 5 ML SYRINGE
INTRAMUSCULAR | Status: DC | PRN
  Administered 2021-11-12: 40 mg via INTRAVENOUS

## 2021-11-12 MED ORDER — ACETAMINOPHEN 500 MG PO TABS
ORAL_TABLET | ORAL | Status: AC
  Filled 2021-11-12: qty 2

## 2021-11-12 MED ORDER — PHENYLEPHRINE HCL-NACL 20-0.9 MG/250ML-% IV SOLN
INTRAVENOUS | Status: DC | PRN
  Administered 2021-11-12: 25 ug/min via INTRAVENOUS

## 2021-11-12 MED ORDER — OXYCODONE HCL 5 MG PO TABS: 5.0000 mg | ORAL_TABLET | Freq: Once | ORAL | Status: DC | PRN

## 2021-11-12 MED ORDER — MIDAZOLAM HCL 2 MG/2ML IJ SOLN
1.0000 mg | Freq: Once | INTRAMUSCULAR | Status: AC
  Administered 2021-11-12: 1 mg via INTRAVENOUS

## 2021-11-12 MED ORDER — SUGAMMADEX SODIUM 200 MG/2ML IV SOLN
INTRAVENOUS | Status: DC | PRN
  Administered 2021-11-12: 150 mg via INTRAVENOUS

## 2021-11-12 MED ORDER — AMISULPRIDE (ANTIEMETIC) 5 MG/2ML IV SOLN
INTRAVENOUS | Status: AC
  Filled 2021-11-12: qty 4

## 2021-11-12 MED ORDER — PROPOFOL 10 MG/ML IV BOLUS
INTRAVENOUS | Status: DC | PRN
  Administered 2021-11-12: 120 mg via INTRAVENOUS

## 2021-11-12 MED ORDER — ONDANSETRON HCL 4 MG/2ML IJ SOLN: 4.0000 mg | Freq: Once | INTRAMUSCULAR | Status: DC | PRN

## 2021-11-12 MED ORDER — GABAPENTIN 300 MG PO CAPS
ORAL_CAPSULE | ORAL | Status: AC
  Filled 2021-11-12: qty 1

## 2021-11-12 MED ORDER — ACETAMINOPHEN 500 MG PO TABS: 1000.0000 mg | ORAL_TABLET | Freq: Once | ORAL | Status: AC

## 2021-11-12 MED ORDER — ONDANSETRON HCL 4 MG/2ML IJ SOLN
INTRAMUSCULAR | Status: DC | PRN
  Administered 2021-11-12: 4 mg via INTRAVENOUS

## 2021-11-12 MED ORDER — DEXAMETHASONE SODIUM PHOSPHATE 10 MG/ML IJ SOLN
INTRAMUSCULAR | Status: AC
  Filled 2021-11-12: qty 1

## 2021-11-12 MED ORDER — ROCURONIUM BROMIDE 10 MG/ML (PF) SYRINGE
PREFILLED_SYRINGE | INTRAVENOUS | Status: AC
  Filled 2021-11-12: qty 10

## 2021-11-12 MED ORDER — DICLOFENAC SODIUM 75 MG PO TBEC: 75.0000 mg | DELAYED_RELEASE_TABLET | Freq: Two times a day (BID) | ORAL | 0 refills | Status: DC

## 2021-11-12 MED ORDER — PROPOFOL 10 MG/ML IV BOLUS
INTRAVENOUS | Status: AC
Start: 2021-11-12 — End: ?
  Filled 2021-11-12: qty 20

## 2021-11-12 MED ORDER — PHENYLEPHRINE HCL (PRESSORS) 10 MG/ML IV SOLN
INTRAVENOUS | Status: DC | PRN
  Administered 2021-11-12: 80 ug via INTRAVENOUS
  Administered 2021-11-12: 160 ug via INTRAVENOUS
  Administered 2021-11-12: 80 ug via INTRAVENOUS

## 2021-11-12 MED ORDER — MIDAZOLAM HCL 2 MG/2ML IJ SOLN
INTRAMUSCULAR | Status: AC
  Filled 2021-11-12: qty 2

## 2021-11-12 MED ORDER — HYDROMORPHONE HCL 1 MG/ML IJ SOLN: 0.2500 mg | INTRAMUSCULAR | Status: DC | PRN

## 2021-11-12 MED ORDER — ROCURONIUM BROMIDE 100 MG/10ML IV SOLN
INTRAVENOUS | Status: DC | PRN
  Administered 2021-11-12: 30 mg via INTRAVENOUS

## 2021-11-12 MED ORDER — SODIUM CHLORIDE 0.9 % IR SOLN: Status: DC | PRN

## 2021-11-12 MED ORDER — FENTANYL CITRATE (PF) 100 MCG/2ML IJ SOLN
INTRAMUSCULAR | Status: DC | PRN
  Administered 2021-11-12: 25 ug via INTRAVENOUS

## 2021-11-12 MED ORDER — SUCCINYLCHOLINE CHLORIDE 200 MG/10ML IV SOSY
PREFILLED_SYRINGE | INTRAVENOUS | Status: DC | PRN
  Administered 2021-11-12: 100 mg via INTRAVENOUS

## 2021-11-12 MED ORDER — OXYCODONE HCL 5 MG/5ML PO SOLN: 5.0000 mg | Freq: Once | ORAL | Status: DC | PRN

## 2021-11-12 MED ORDER — ONDANSETRON HCL 4 MG PO TABS: 4.0000 mg | ORAL_TABLET | Freq: Three times a day (TID) | ORAL | 0 refills | Status: AC | PRN

## 2021-11-12 MED ORDER — CEFAZOLIN SODIUM-DEXTROSE 2-4 GM/100ML-% IV SOLN
2.0000 g | INTRAVENOUS | Status: AC
  Administered 2021-11-12: 2 g via INTRAVENOUS

## 2021-11-12 MED ORDER — CEFAZOLIN SODIUM-DEXTROSE 2-4 GM/100ML-% IV SOLN
INTRAVENOUS | Status: AC
  Filled 2021-11-12: qty 100

## 2021-11-12 MED ORDER — DEXAMETHASONE SODIUM PHOSPHATE 10 MG/ML IJ SOLN
INTRAMUSCULAR | Status: DC | PRN
  Administered 2021-11-12: 8 mg via INTRAVENOUS

## 2021-11-12 SURGICAL SUPPLY — 65 items
AID PSTN UNV HD RSTRNT DISP (MISCELLANEOUS) ×1
ANCH SUT 2 FBRTK KNTLS 1.8 (Anchor) ×2 IMPLANT
ANCH SUT SWLK 19.1X4.75 (Anchor) ×1 IMPLANT
ANCHOR SUT 1.8 FIBERTAK SB KL (Anchor) ×2 IMPLANT
ANCHOR SUT BIO SW 4.75X19.1 (Anchor) ×1 IMPLANT
APL PRP STRL LF DISP 70% ISPRP (MISCELLANEOUS) ×1
BLADE EXCALIBUR 4.0X13 (MISCELLANEOUS) ×3 IMPLANT
BLADE SURG 10 STRL SS (BLADE) IMPLANT
BURR OVAL 8 FLU 4.0X13 (MISCELLANEOUS) IMPLANT
CANNULA 5.75X71 LONG (CANNULA) IMPLANT
CANNULA PASSPORT 5 (CANNULA) IMPLANT
CANNULA PASSPORT BUTTON 10-40 (CANNULA) ×1 IMPLANT
CANNULA TWIST IN 8.25X7CM (CANNULA) IMPLANT
CHLORAPREP W/TINT 26 (MISCELLANEOUS) ×3 IMPLANT
CLSR STERI-STRIP ANTIMIC 1/2X4 (GAUZE/BANDAGES/DRESSINGS) ×3 IMPLANT
COOLER ICEMAN CLASSIC (MISCELLANEOUS) ×3 IMPLANT
DRAPE IMP U-DRAPE 54X76 (DRAPES) ×3 IMPLANT
DRAPE INCISE IOBAN 66X45 STRL (DRAPES) IMPLANT
DRAPE SHOULDER BEACH CHAIR (DRAPES) ×3 IMPLANT
DRSG PAD ABDOMINAL 8X10 ST (GAUZE/BANDAGES/DRESSINGS) ×3 IMPLANT
DW OUTFLOW CASSETTE/TUBE SET (MISCELLANEOUS) ×3 IMPLANT
GAUZE SPONGE 4X4 12PLY STRL (GAUZE/BANDAGES/DRESSINGS) ×3 IMPLANT
GLOVE BIO SURGEON STRL SZ 6.5 (GLOVE) ×2 IMPLANT
GLOVE BIOGEL M 7.0 STRL (GLOVE) ×2 IMPLANT
GLOVE BIOGEL PI IND STRL 6.5 (GLOVE) ×2 IMPLANT
GLOVE BIOGEL PI IND STRL 7.0 (GLOVE) IMPLANT
GLOVE BIOGEL PI IND STRL 7.5 (GLOVE) IMPLANT
GLOVE BIOGEL PI IND STRL 8 (GLOVE) ×2 IMPLANT
GLOVE BIOGEL PI INDICATOR 6.5 (GLOVE) ×1
GLOVE BIOGEL PI INDICATOR 7.0 (GLOVE) ×1
GLOVE BIOGEL PI INDICATOR 7.5 (GLOVE) ×1
GLOVE BIOGEL PI INDICATOR 8 (GLOVE) ×1
GLOVE ECLIPSE 8.0 STRL XLNG CF (GLOVE) ×3 IMPLANT
GOWN STRL REUS W/ TWL LRG LVL3 (GOWN DISPOSABLE) ×4 IMPLANT
GOWN STRL REUS W/TWL LRG LVL3 (GOWN DISPOSABLE) ×4
GOWN STRL REUS W/TWL XL LVL3 (GOWN DISPOSABLE) ×3 IMPLANT
KIT SHOULDER STAB MARCO (KITS) ×3 IMPLANT
KIT STR SPEAR 1.8 FBRTK DISP (KITS) ×1 IMPLANT
LASSO CRESCENT QUICKPASS (SUTURE) IMPLANT
MANIFOLD NEPTUNE II (INSTRUMENTS) ×3 IMPLANT
NDL SAFETY ECLIPSE 18X1.5 (NEEDLE) ×2 IMPLANT
NDL SCORPION MULTI FIRE (NEEDLE) IMPLANT
NEEDLE HYPO 18GX1.5 SHARP (NEEDLE) ×2
NEEDLE SCORPION MULTI FIRE (NEEDLE) ×2 IMPLANT
PACK ARTHROSCOPY DSU (CUSTOM PROCEDURE TRAY) ×3 IMPLANT
PACK BASIN DAY SURGERY FS (CUSTOM PROCEDURE TRAY) ×3 IMPLANT
PAD COLD SHLDR WRAP-ON (PAD) ×3 IMPLANT
PORT APPOLLO RF 90DEGREE MULTI (SURGICAL WAND) ×3 IMPLANT
RESTRAINT HEAD UNIVERSAL NS (MISCELLANEOUS) ×3 IMPLANT
SHEET MEDIUM DRAPE 40X70 STRL (DRAPES) IMPLANT
SLEEVE SCD COMPRESS KNEE MED (STOCKING) ×3 IMPLANT
SLING ARM FOAM STRAP LRG (SOFTGOODS) IMPLANT
SUT FIBERWIRE #2 38 T-5 BLUE (SUTURE)
SUT MNCRL AB 4-0 PS2 18 (SUTURE) ×3 IMPLANT
SUT PDS AB 1 CT  36 (SUTURE) ×2
SUT PDS AB 1 CT 36 (SUTURE) IMPLANT
SUT TIGER TAPE 7 IN WHITE (SUTURE) IMPLANT
SUTURE FIBERWR #2 38 T-5 BLUE (SUTURE) IMPLANT
SUTURE TAPE TIGERLINK 1.3MM BL (SUTURE) IMPLANT
SUTURETAPE TIGERLINK 1.3MM BL (SUTURE)
SYR 5ML LL (SYRINGE) ×3 IMPLANT
TAPE FIBER 2MM 7IN #2 BLUE (SUTURE) ×1 IMPLANT
TOWEL GREEN STERILE FF (TOWEL DISPOSABLE) ×6 IMPLANT
TUBE CONNECTING 20X1/4 (TUBING) ×3 IMPLANT
TUBING ARTHROSCOPY IRRIG 16FT (MISCELLANEOUS) ×3 IMPLANT

## 2021-11-12 NOTE — Transfer of Care (Signed)
Immediate Anesthesia Transfer of Care Note  Patient: Whitney Parrish  Procedure(s) Performed: SHOULDER ARTHROSCOPY WITH SUBACROMIAL DECOMPRESSION, ROTATOR CUFF REPAIR AND BICEP TENDON REPAIR (Right: Shoulder)  Patient Location: PACU  Anesthesia Type:GA combined with regional for post-op pain  Level of Consciousness: drowsy  Airway & Oxygen Therapy: Patient Spontanous Breathing and Patient connected to face mask oxygen  Post-op Assessment: Report given to RN and Post -op Vital signs reviewed and stable  Post vital signs: Reviewed and stable  Last Vitals:  Vitals Value Taken Time  BP 142/81 11/12/21 1338  Temp    Pulse 64 11/12/21 1340  Resp 19 11/12/21 1340  SpO2 100 % 11/12/21 1340  Vitals shown include unvalidated device data.  Last Pain: There were no vitals filed for this visit.       Complications: No notable events documented.

## 2021-11-12 NOTE — Interval H&P Note (Signed)
All questions answered, patient wants to proceed with procedure. ? ?

## 2021-11-12 NOTE — Progress Notes (Signed)
Assisted Dr. Finucane with right, interscalene , ultrasound guided block. Side rails up, monitors on throughout procedure. See vital signs in flow sheet. Tolerated Procedure well. 

## 2021-11-12 NOTE — Anesthesia Procedure Notes (Signed)
Procedure Name: Intubation Date/Time: 11/12/2021 12:08 PM  Performed by: Lavonia Dana, CRNAPre-anesthesia Checklist: Patient identified, Emergency Drugs available, Suction available and Patient being monitored Patient Re-evaluated:Patient Re-evaluated prior to induction Oxygen Delivery Method: Circle system utilized Preoxygenation: Pre-oxygenation with 100% oxygen Induction Type: IV induction and Rapid sequence Ventilation: Mask ventilation without difficulty Laryngoscope Size: Mac and 3 Grade View: Grade I Tube type: Oral Tube size: 7.0 mm Number of attempts: 1 Airway Equipment and Method: Stylet and Bite block Placement Confirmation: ETT inserted through vocal cords under direct vision, positive ETCO2 and breath sounds checked- equal and bilateral Secured at: 22 cm Tube secured with: Tape Dental Injury: Teeth and Oropharynx as per pre-operative assessment

## 2021-11-12 NOTE — Anesthesia Postprocedure Evaluation (Signed)
Anesthesia Post Note  Patient: THECLA FORGIONE  Procedure(s) Performed: SHOULDER ARTHROSCOPY WITH SUBACROMIAL DECOMPRESSION, ROTATOR CUFF REPAIR AND BICEP TENDON REPAIR (Right: Shoulder)     Patient location during evaluation: PACU Anesthesia Type: Regional and General Level of consciousness: awake and alert, oriented and patient cooperative Pain management: pain level controlled Vital Signs Assessment: post-procedure vital signs reviewed and stable Respiratory status: spontaneous breathing, nonlabored ventilation and respiratory function stable Cardiovascular status: blood pressure returned to baseline and stable Postop Assessment: no apparent nausea or vomiting Anesthetic complications: no   No notable events documented.  Last Vitals:  Vitals:   11/12/21 1341 11/12/21 1345  BP: (!) 142/81 131/71  Pulse: 66 63  Resp: 18 17  Temp: (!) 36.2 C   SpO2: 100% 100%    Last Pain:  Vitals:   11/12/21 1341  PainSc: Asleep                 Lannie Fields

## 2021-11-12 NOTE — Anesthesia Procedure Notes (Signed)
Anesthesia Regional Block: Interscalene brachial plexus block   Pre-Anesthetic Checklist: , timeout performed,  Correct Patient, Correct Site, Correct Laterality,  Correct Procedure, Correct Position, site marked,  Risks and benefits discussed,  Surgical consent,  Pre-op evaluation,  At surgeon's request and post-op pain management  Laterality: Right  Prep: Maximum Sterile Barrier Precautions used, chloraprep       Needles:  Injection technique: Single-shot  Needle Type: Echogenic Stimulator Needle     Needle Length: 9cm  Needle Gauge: 22     Additional Needles:   Procedures:,,,, ultrasound used (permanent image in chart),,    Narrative:  Start time: 11/12/2021 11:15 AM End time: 11/12/2021 11:20 AM Injection made incrementally with aspirations every 5 mL.  Performed by: Personally  Anesthesiologist: Lannie Fields, DO  Additional Notes: Monitors applied. No increased pain on injection. No increased resistance to injection. Injection made in 5cc increments. Good needle visualization. Patient tolerated procedure well.

## 2021-11-12 NOTE — Anesthesia Preprocedure Evaluation (Addendum)
Anesthesia Evaluation  Patient identified by MRN, date of birth, ID band Patient awake    Reviewed: Allergy & Precautions, NPO status , Patient's Chart, lab work & pertinent test results  Airway Mallampati: II  TM Distance: >3 FB Neck ROM: Full    Dental  (+) Edentulous Upper, Partial Lower   Pulmonary asthma (rescue inhlaer twice daily ) , Current Smoker,  1 pack every 4 days    Pulmonary exam normal breath sounds clear to auscultation       Cardiovascular hypertension (140/69 in preop), Pt. on medications +CHF (grade 1 diastolic dysfunction)  Normal cardiovascular exam+ Valvular Problems/Murmurs (mild MR) MR  Rhythm:Regular Rate:Normal  Echo 2014 - Left ventricle: The cavity size was normal. There was mild  concentric hypertrophy. Systolic function was vigorous.  The estimated ejection fraction was in the range of 65% to  70%. Wall motion was normal; there were no regional wall  motion abnormalities. Doppler parameters are consistent  with abnormal left ventricular relaxation (grade 1  diastolic dysfunction).  - Mitral valve: Mild regurgitation.     Neuro/Psych  Headaches, PSYCHIATRIC DISORDERS Anxiety Depression    GI/Hepatic Neg liver ROS, GERD  Medicated and Controlled,  Endo/Other  negative endocrine ROS  Renal/GU CRFRenal diseaseCKD 3  negative genitourinary   Musculoskeletal  (+) Arthritis , Osteoarthritis,    Abdominal   Peds  Hematology negative hematology ROS (+)   Anesthesia Other Findings   Reproductive/Obstetrics negative OB ROS                            Anesthesia Physical Anesthesia Plan  ASA: 3  Anesthesia Plan: General and Regional   Post-op Pain Management: Regional block* and Tylenol PO (pre-op)*   Induction: Intravenous  PONV Risk Score and Plan: 2 and Ondansetron, Dexamethasone, Midazolam and Treatment may vary due to age or medical  condition  Airway Management Planned: Oral ETT  Additional Equipment: None  Intra-op Plan:   Post-operative Plan: Extubation in OR  Informed Consent: I have reviewed the patients History and Physical, chart, labs and discussed the procedure including the risks, benefits and alternatives for the proposed anesthesia with the patient or authorized representative who has indicated his/her understanding and acceptance.     Dental advisory given  Plan Discussed with: CRNA  Anesthesia Plan Comments:        Anesthesia Quick Evaluation

## 2021-11-13 ENCOUNTER — Encounter (HOSPITAL_BASED_OUTPATIENT_CLINIC_OR_DEPARTMENT_OTHER): Payer: Self-pay | Admitting: Orthopaedic Surgery

## 2021-11-21 NOTE — Op Note (Signed)
Orthopaedic Surgery Operative Note (CSN: [id number])  Whitney Parrish  1955-08-16 Date of Surgery: 11/12/2021   DIAGNOSES: Right shoulder, acute on chronic rotator cuff tear, SLAP tear, biceps tendinitis, AC arthritis, and subacromial impingement.  POST-OPERATIVE DIAGNOSIS: same  PROCEDURE: Arthroscopic extensive debridement - 29823 Subdeltoid Bursa, Supraspinatus Tendon, Anterior Labrum, and Superior Labrum Arthroscopic distal clavicle excision - 18563 Arthroscopic subacromial decompression - 14970 Arthroscopic rotator cuff repair - 26378 Arthroscopic biceps tenodesis - 58850   OPERATIVE FINDING: Exam under anesthesia: Normal Articular space: Normal Chondral surfaces: Normal Biceps:  Type 2 slap tear w biceps tearing Subscapularis: Intact  Supraspinatus: Complete tear small leading edge tear repaired with 1 anchor speed fix technique Infraspinatus: Intact      Post-operative plan: The patient will be non-weightbearing in a sling 6 weeks.  The patient will be discharged home.  DVT prophylaxis not indicated in ambulatory upper extremity patient without known risk factors.   Pain control with PRN pain medication preferring oral medicines.  Follow up plan will be scheduled in approximately 7 days for incision check and XR.  Surgeons:Primary: Bjorn Pippin, MD Assistants:Caroline McBane PA-C Location: [facility] OR ROOM 6 Anesthesia: General with Exparel interscalene block Antibiotics: Ancef 2 g Tourniquet time: None Estimated Blood Loss: Minimal Complications: None Specimens: None Implants: Implant Name Type Inv. Item Serial No. Manufacturer Lot No. LRB No. Used Action  ANCHOR SUT 1.8 FIBERTAK SB KL - [id number] Anchor ANCHOR SUT 1.8 FIBERTAK SB KL  [employer] [id number] Right 1 Implanted  ANCHOR SUT BIO SW 4.75X19.1 - [id number] Anchor ANCHOR SUT BIO SW 4.75X19.1  [employer] [id number] Right 1 Implanted  ANCHOR SUT 1.8 FIBERTAK SB KL - [id number] Anchor ANCHOR SUT 1.8 [employer] [id number] Right 1 Implanted    Indications for Surgery:   Whitney Parrish is a 66 y.o. female with continued shoulder pain refractory to nonoperative measures for extended period of time.    The risks and benefits were explained at length including but not limited to continued pain, cuff failure, biceps tenodesis failure, stiffness, need for further surgery and infection.   Procedure:   Patient was correctly identified in the preoperative holding area and operative site marked.  Patient brought to OR and positioned beachchair on an [place] table ensuring that all bony prominences were padded and the head was in an appropriate location.  Anesthesia was induced and the operative shoulder was prepped and draped in the usual sterile fashion.  Timeout was called preincision.  A standard posterior viewing portal was made after localizing the portal with a spinal needle.  An anterior accessory portal was also made.  After clearing the articular space the camera was positioned in the subacromial space.  Findings above.    Extensive debridement was performed of the anterior interval tissue, labral fraying and the bursa.  Subacromial decompression: We made a lateral portal with spinal needle guidance. We then proceeded to debride bursal tissue extensively with a shaver and arthrocare device. At that point we continued to identify the borders of the acromion and identify the spur. We then carefully preserved the deltoid fascia and used a burr to convert the acromion to a Type 1 flat acromion without issue.  Biceps tenodesis: We marked the tendon and then performed a tenotomy and debridement of the stump in the articular space. We then identified the biceps tendon in its groove suprapec with the arthroscope in the lateral portal taking care to move from lateral to medial to avoid  injury to the subscapularis. At that point we unroofed the tendon itself and mobilized it. An accessory anterior portal was  made in line with the tendon and we grasped it from the anterior superior portal and worked from the accessory anterior portal. Two [employer] 1.70mm knotless anchors were placed in the groove and the tendon was secured in a luggage loop style fashion with a pass of the limb of suture through the tendon using a scorpion device to avoid pull-through.  Repair was completed with good tension on the tendon.  Residual stump of the tendon was removed after being resected with a RF ablator.  Distal Clavicle resection:  The scope was placed in the subacromial space from the posterior portal.  A hemostat was placed through the anterior portal and we spread at the [facility] joint.  A burr was then inserted and 10 mm of distal clavicle was resected taking care to avoid damage to the capsule around the joint and avoiding overhanging bone posteriorly.    Rotator cuff repair - we identified the tear in the subacromial space.  The tuberosity was prepped with a burr.  We then used a scorpion device to pass a suture tape in a horizontal mattress fashion and anchor the tendon to bone with a swivel lock 4.75 biocomposite anchor 8-10 mm off the tuberosity edge. Robust repair noted.  The incisions were closed with absorbable monocryl and steri strips.  A sterile dressing was placed along with a sling. The patient was awoken from general anesthesia and taken to the PACU in stable condition without complication.   Alfonse Alpers, PA-C, present and scrubbed throughout the case, critical for completion in a timely fashion, and for retraction, instrumentation, closure.

## 2022-05-21 ENCOUNTER — Encounter (HOSPITAL_COMMUNITY): Payer: Self-pay | Admitting: Emergency Medicine

## 2022-05-21 ENCOUNTER — Ambulatory Visit (HOSPITAL_COMMUNITY)
Admission: EM | Admit: 2022-05-21 | Discharge: 2022-05-21 | Disposition: A | Discharge status: Home / Self Care | Payer: 59 | Attending: Internal Medicine | Admitting: Internal Medicine

## 2022-05-21 DIAGNOSIS — J019 Acute sinusitis, unspecified: Secondary | ICD-10-CM

## 2022-05-21 DIAGNOSIS — R42 Dizziness and giddiness: Secondary | ICD-10-CM

## 2022-05-21 DIAGNOSIS — R051 Acute cough: Secondary | ICD-10-CM

## 2022-05-21 DIAGNOSIS — B9689 Other specified bacterial agents as the cause of diseases classified elsewhere: Secondary | ICD-10-CM

## 2022-05-21 MED ORDER — DOXYCYCLINE HYCLATE 100 MG PO CAPS: 100.0000 mg | ORAL_CAPSULE | Freq: Two times a day (BID) | ORAL | 0 refills | Status: AC

## 2022-05-21 MED ORDER — BENZONATATE 100 MG PO CAPS: 100.0000 mg | ORAL_CAPSULE | Freq: Three times a day (TID) | ORAL | 0 refills | Status: DC

## 2022-05-21 MED ORDER — ONDANSETRON 4 MG PO TBDP: 4.0000 mg | ORAL_TABLET | Freq: Three times a day (TID) | ORAL | 0 refills | Status: DC | PRN

## 2022-05-21 NOTE — Discharge Instructions (Addendum)
Your lung sounds are great today.  Start taking doxycycline antibiotic twice daily (once in the morning with breakfast and once at dinnertime) for the next 7 days.  This will help with your sinus infection.   Take Tessalon Perles every 8 hours as needed for cough.  Take Zofran 4 mg underneath the tongue every 8 hours as needed for nausea and vomiting. Purchase Ensure protein shakes to increase calorie consumption while you are undergoing a lot of stress and grief. I would like for you to schedule a follow-up appointment with your primary care provider for further evaluation of your recent weight loss.   Drink plenty of water to stay well-hydrated.  If you develop any new or worsening symptoms or do not improve in the next 2 to 3 days, please return.  If your symptoms are severe, please go to the emergency room.  Follow-up with your primary care provider for further evaluation and management of your symptoms as well as ongoing wellness visits.  I hope you feel better!

## 2022-05-21 NOTE — ED Triage Notes (Signed)
Pt reports had issues with dizzy for 3 days, states has vertigo.  Pt reports had for couple weeks and has pain in chest when coughs and deep breaths. Reports cough is now dry.

## 2022-05-21 NOTE — ED Provider Notes (Signed)
[facility]    CSN: [id number] Arrival date & time: 05/21/22  1814      History   Chief Complaint Chief Complaint  Patient presents with   Dizziness   Cough    HPI Whitney Parrish is a 67 y.o. female.   Patient with history of asthma, CKD stage 3, BPV, migraines, and HTN presents to urgent care for evaluation of cough and nasal congestion for the last 2-3 weeks. Symptoms started with sore throat, productive cough, body aches, and fever/chills. She is no longer experiencing fever/chills, but cough and nasal congestion have persisted. She also reports slight facial pain to the cheeks. Husband also states she has had decreased appetite for the last several weeks and has lost an unknown amount of weight. Patient reports slight nausea without vomiting, diarrhea, or abdominal pain. Cough is dry and nonproductive. She is a current everyday smoker and states a pack of cigarettes lasts her "3-4 days". Patient also reports ongoing dizziness. She has a history of vertigo and takes meclizine regularly with relief of dizziness. She denies heart palpitations, vision changes, one-sided limb weakness, headache, recent falls, and urinary symptoms. She was experiencing fever at the beginning of symptoms, but is no longer having fever/chills. No recent antibiotic or steroid use. Using OTC medications for cough without relief.    Dizziness Cough   Past Medical History:  Diagnosis Date   Anxiety    Arthritis    shoulders and hands   Asthma    Benign positional vertigo    Chronically dry eyes    CKD (chronic kidney disease), stage III (HCC)    Depression    Dyspnea on exertion    GERD (gastroesophageal reflux disease)    Hematuria    History of acute pyelonephritis    History of Bell's palsy    1980's left side-- residual lip numbness intermittant   History of chest pain    non-cardiac per dr Algie Coffer notes (pt's pcp)   History of panic attacks    Hypertension    Left ureteral stone     Loose bowel movements    chronic-per pt residual from injury during abdominal surgery in 1986   Migraines    Nephrolithiasis    left    Psoriasis    Urgency of urination    Wears dentures    has upper and lower but only wears upper    Patient Active Problem List   Diagnosis Date Noted   Acute pyelonephritis 07/08/2020   Sepsis (HCC) 07/08/2020   CKD (chronic kidney disease) stage 3, GFR 30-59 ml/min (HCC) 07/08/2020   Right lower lobe pulmonary nodule 07/08/2020   Colon cancer screening 06/26/2020   Dysphagia 06/26/2020   Gastroesophageal reflux disease 06/26/2020   Acute pyonephrosis 09/09/2015   Chronic renal insufficiency 09/09/2015   Acute renal insufficiency 09/09/2015   Obstructive uropathy 09/08/2015   Left nephrolithiasis 09/08/2015   Chest pain at rest 11/15/2011   Dizziness 11/15/2011   Breast abscess 10/06/2011    Past Surgical History:  Procedure Laterality Date   BREAST CYST EXCISION  12/15/2011   Procedure: CYST EXCISION BREAST;  Surgeon: Shelly Rubenstein, MD;  Location: [facility];  Service: General;  Laterality: Left;  Excision of a Chronic Left Breast Sebaceous Cyst   CARDIOVASCULAR STRESS TEST  12-28-2012   Low risk nuclear study w/ small LV cavity which may be due LVH/  no ischemia or infarct/  normal wall motion, ef 67%   CYSTOSCOPY WITH STENT PLACEMENT  Left 09/08/2015   Procedure: CYSTOSCOPY WITH LEFT STENT PLACEMENT;  Surgeon: Irine Seal, MD;  Location: [facility];  Service: Urology;  Laterality: Left;   CYSTOSCOPY WITH URETEROSCOPY AND STENT PLACEMENT Left 09/25/2015   Procedure: LEFT  URETEROSCOPY STONE EXTRACTION;  Surgeon: Irine Seal, MD;  Location: [facility];  Service: Urology;  Laterality: Left;   EXCISION AND REVISION SCAR ABDOMINAL WALL  01-22-2002   EXPLORATORY LAPAROTOMY  1986   w/ Abdominal Myomectomy  (per pt bowel muscle cut (injury))   HOLMIUM LASER APPLICATION Left 1/88/4166   Procedure: HOLMIUM LASER APPLICATION;  Surgeon:  Irine Seal, MD;  Location: [facility];  Service: Urology;  Laterality: Left;   I & D LEFT BUTTOCK ABSCESS   07-01-2010   x2 area's   NEGATIVE SLEEP STUDY  08-10-2005   SHOULDER ARTHROSCOPY WITH SUBACROMIAL DECOMPRESSION, ROTATOR CUFF REPAIR AND BICEP TENDON REPAIR Right 11/12/2021   Procedure: SHOULDER ARTHROSCOPY WITH SUBACROMIAL DECOMPRESSION, ROTATOR CUFF REPAIR AND BICEP TENDON REPAIR;  Surgeon: Hiram Gash, MD;  Location: [place];  Service: Orthopedics;  Laterality: Right;   TRANSTHORACIC ECHOCARDIOGRAM  12-28-2012   grade 1 diastolic dysfunction, mild concentric LVH, ef 65-70%/  mild MR/  trivial PR and TR    OB History   No obstetric history on file.      Home Medications    Prior to Admission medications   Medication Sig Start Date End Date Taking? Authorizing Provider  benzonatate (TESSALON) 100 MG capsule Take 1 capsule (100 mg total) by mouth every 8 (eight) hours. 05/21/22  Yes Talbot Grumbling, FNP  doxycycline (VIBRAMYCIN) 100 MG capsule Take 1 capsule (100 mg total) by mouth 2 (two) times daily for 7 days. 05/21/22 05/28/22 Yes Maven Rosander, Stasia Cavalier, FNP  ondansetron (ZOFRAN-ODT) 4 MG disintegrating tablet Take 1 tablet (4 mg total) by mouth every 8 (eight) hours as needed for nausea or vomiting. 05/21/22  Yes Talbot Grumbling, FNP  albuterol (PROVENTIL HFA;VENTOLIN HFA) 108 (90 Base) MCG/ACT inhaler Inhale 2 puffs into the lungs 3 (three) times daily.    [provider]  ALPRAZolam Duanne Moron) 0.25 MG tablet Take 0.25 mg by mouth at bedtime as needed for anxiety or sleep.     [provider]  amLODipine (NORVASC) 2.5 MG tablet Take 2.5 mg by mouth daily.    [provider]  budesonide-formoterol (SYMBICORT) 160-4.5 MCG/ACT inhaler Inhale 2 puffs into the lungs 2 (two) times daily.    [provider]  cycloSPORINE (RESTASIS) 0.05 % ophthalmic emulsion Place 1 drop into both eyes daily.    [provider]  diclofenac (VOLTAREN) 75 MG EC tablet Take 1 tablet (75 mg total) by mouth 2 (two) times daily. 11/12/21   McBane, Maylene Roes, PA-C  dicyclomine (BENTYL) 10 MG capsule Take 10 mg by mouth in the morning and at bedtime.    [provider]  gabapentin (NEURONTIN) 300 MG capsule Take 300 mg by mouth at bedtime. 06/26/19   [provider]  Guselkumab 100 MG/ML SOPN Inject 100 mg into the skin See admin instructions. Every 8 weeks  Please resume only after talking with your primary care MD 07/12/20   Jonetta Osgood, MD  meclizine (ANTIVERT) 25 MG tablet Take 25 mg by mouth 2 (two) times daily.    [provider]  Multiple Vitamins-Minerals (CENTRUM SILVER 50+WOMEN PO) Take 1 tablet by mouth daily.    [provider]  nitroGLYCERIN (NITROSTAT) 0.4 MG SL tablet  Place 1 tablet (0.4 mg total) under the tongue every 5 (five) minutes x 3 doses as needed for chest pain. Patient taking differently: Place 0.4 mg under the tongue every 5 (five) minutes x 3 doses as needed for chest pain. Per patient hasn't used in over a year 12/29/12   [name], MD  omeprazole (PRILOSEC) 20 MG capsule Take 40 mg by mouth daily. 06/07/20   [provider]    Family History Family History  Problem Relation Age of Onset   Cancer Mother        Stomach   Cancer Father        Unknown    Social History Social History   Tobacco Use   Smoking status: Every Day    Packs/day: 0.25    Years: 30.00    Total pack years: 7.50    Types: Cigarettes   Smokeless tobacco: Never   Tobacco comments:    " QUITTING ON HER OWN "  down to 5 cig daily  Substance Use Topics   Alcohol use: No   Drug use: No     Allergies   Sulfa antibiotics   Review of Systems Review of Systems  Respiratory:  Positive for cough.   Neurological:  Positive for dizziness.  Per HPI   Physical Exam Triage Vital Signs ED Triage Vitals  Enc Vitals Group     BP 05/21/22 1946 (!)  145/84     Pulse Rate 05/21/22 1946 69     Resp 05/21/22 1946 18     Temp 05/21/22 1946 98 F (36.7 C)     Temp Source 05/21/22 1946 Oral     SpO2 05/21/22 1946 97 %     Weight 05/21/22 1951 97 lb (44 kg)     Height --      Head Circumference --      Peak Flow --      Pain Score 05/21/22 1945 7     Pain Loc --      Pain Edu? --      Excl. in GC? --    No data found.  Updated Vital Signs BP (!) 145/84 (BP Location: Left Arm)   Pulse 69   Temp 98 F (36.7 C) (Oral)   Resp 18   Wt 97 lb (44 kg)   SpO2 97%   BMI 17.74 kg/m   Visual Acuity Right Eye Distance:   Left Eye Distance:   Bilateral Distance:    Right Eye Near:   Left Eye Near:    Bilateral Near:     Physical Exam Vitals and nursing note reviewed.  Constitutional:      Appearance: Normal appearance. She is not ill-appearing or toxic-appearing.  HENT:     Head: Normocephalic and atraumatic.     Right Ear: Hearing, tympanic membrane, ear canal and external ear normal.     Left Ear: Hearing, tympanic membrane, ear canal and external ear normal.     Nose:     Right Sinus: Maxillary sinus tenderness and frontal sinus tenderness present.     Left Sinus: Maxillary sinus tenderness and frontal sinus tenderness present.     Mouth/Throat:     Lips: Pink.     Mouth: Mucous membranes are moist.     Pharynx: Oropharynx is clear. Uvula midline. No posterior oropharyngeal erythema or uvula swelling.     Tonsils: No tonsillar exudate or tonsillar abscesses.     Comments: Moderate amount of clear/purulent postnasal drainage visualized to  the posterior oropharynx.  Eyes:     General: Lids are normal. Vision grossly intact. Gaze aligned appropriately.        Right eye: No discharge.        Left eye: No discharge.     Extraocular Movements: Extraocular movements intact.     Conjunctiva/sclera: Conjunctivae normal.  Cardiovascular:     Rate and Rhythm: Normal rate and regular rhythm.     Heart sounds: Normal heart  sounds, S1 normal and S2 normal.  Pulmonary:     Effort: Pulmonary effort is normal. No respiratory distress.     Breath sounds: Normal breath sounds and air entry. No stridor. No wheezing, rhonchi or rales.     Comments: No adventitious breath sounds heard to auscultation. No respiratory distress.  Chest:     Chest wall: No tenderness.  Abdominal:     General: Bowel sounds are normal.     Palpations: Abdomen is soft.     Tenderness: There is no abdominal tenderness. There is no right CVA tenderness, left CVA tenderness or guarding.  Musculoskeletal:     Cervical back: Neck supple.     Right lower leg: No edema.     Left lower leg: No edema.  Lymphadenopathy:     Cervical: Cervical adenopathy present.  Skin:    General: Skin is warm and dry.     Capillary Refill: Capillary refill takes less than 2 seconds.     Findings: No rash.  Neurological:     General: No focal deficit present.     Mental Status: She is alert and oriented to person, place, and time. Mental status is at baseline.     Cranial Nerves: Cranial nerves 2-12 are intact. No dysarthria or facial asymmetry.     Sensory: Sensation is intact.     Motor: Motor function is intact. No weakness, abnormal muscle tone or pronator drift.     Coordination: Coordination is intact. Romberg sign negative. Coordination normal. Finger-Nose-Finger Test normal.     Gait: Gait is intact. Gait normal.     Comments: Non focal neuro exam. Moves all 4 extremities with normal coordination voluntarily.   Psychiatric:        Mood and Affect: Mood normal.        Speech: Speech normal.        Behavior: Behavior normal.        Thought Content: Thought content normal.        Judgment: Judgment normal.      UC Treatments / Results  Labs (all labs ordered are listed, but only abnormal results are displayed) Labs Reviewed - No data to display  EKG   Radiology No results found.  Procedures Procedures (including critical care  time)  Medications Ordered in UC Medications - No data to display  Initial Impression / Assessment and Plan / UC Course  I have reviewed the triage vital signs and the nursing notes.  Pertinent labs & imaging results that were available during my care of the patient were reviewed by me and considered in my medical decision making (see chart for details).   1. Acute bacterial sinusitis, acute cough, dizziness Presentation is consistent with acute bacterial sinusitis. No clinical indication for imaging based on stable cardiopulmonary exam and hemodynamically stable vital signs. She is nontoxic in appearance and appears well hydrated. Neurologic exam is without focal finding. Doxycycline antibiotic to treat acute bacterial sinusitis to be taken twice daily for 7 days. No renal dosing necessary with  doxycycline. Tessalon perles every 8 hours as needed for cough. Zofran 4mg  ODT may be used every 8 hours as needed for nausea. Encouraged patient to purchase ensure nutrition supplement to promote weight maintenance. Encouraged patient to schedule an appointment with PCP for follow-up in the next 5-7 days to ensure symptoms are improving and to discuss ongoing management of recent weight loss. Patient and husband voice understanding and agreement with plan.   Discussed physical exam and available lab work findings in clinic with patient.  Counseled patient regarding appropriate use of medications and potential side effects for all medications recommended or prescribed today. Discussed red flag signs and symptoms of worsening condition,when to call the PCP office, return to urgent care, and when to seek higher level of care in the emergency department. Patient verbalizes understanding and agreement with plan. All questions answered. Patient discharged in stable condition.    Final Clinical Impressions(s) / UC Diagnoses   Final diagnoses:  Acute bacterial sinusitis  Acute cough  Dizziness      Discharge Instructions      Your lung sounds are great today.  Start taking doxycycline antibiotic twice daily (once in the morning with breakfast and once at dinnertime) for the next 7 days.  This will help with your sinus infection.   Take Tessalon Perles every 8 hours as needed for cough.  Take Zofran 4 mg underneath the tongue every 8 hours as needed for nausea and vomiting. Purchase Ensure protein shakes to increase calorie consumption while you are undergoing a lot of stress and grief. I would like for you to schedule a follow-up appointment with your primary care provider for further evaluation of your recent weight loss.   Drink plenty of water to stay well-hydrated.  If you develop any new or worsening symptoms or do not improve in the next 2 to 3 days, please return.  If your symptoms are severe, please go to the emergency room.  Follow-up with your primary care provider for further evaluation and management of your symptoms as well as ongoing wellness visits.  I hope you feel better!    ED Prescriptions     Medication Sig Dispense Auth. Provider   doxycycline (VIBRAMYCIN) 100 MG capsule Take 1 capsule (100 mg total) by mouth 2 (two) times daily for 7 days. 14 capsule Joella Prince M, FNP   benzonatate (TESSALON) 100 MG capsule Take 1 capsule (100 mg total) by mouth every 8 (eight) hours. 21 capsule Joella Prince M, FNP   ondansetron (ZOFRAN-ODT) 4 MG disintegrating tablet Take 1 tablet (4 mg total) by mouth every 8 (eight) hours as needed for nausea or vomiting. 20 tablet Talbot Grumbling, FNP      PDMP not reviewed this encounter.   Talbot Grumbling, [place] 05/24/22 2107

## 2022-06-28 ENCOUNTER — Other Ambulatory Visit: Payer: Self-pay | Admitting: Cardiovascular Disease

## 2022-06-28 ENCOUNTER — Ambulatory Visit
Admission: RE | Admit: 2022-06-28 | Discharge: 2022-06-28 | Disposition: A | Discharge status: Home / Self Care | Payer: 59 | Source: Ambulatory Visit | Attending: Cardiovascular Disease | Admitting: Cardiovascular Disease

## 2022-06-28 DIAGNOSIS — R058 Other specified cough: Secondary | ICD-10-CM

## 2022-10-27 ENCOUNTER — Other Ambulatory Visit: Payer: Self-pay | Admitting: Cardiovascular Disease

## 2022-10-27 ENCOUNTER — Ambulatory Visit
Admission: RE | Admit: 2022-10-27 | Discharge: 2022-10-27 | Disposition: A | Discharge status: Home / Self Care | Payer: 59 | Source: Ambulatory Visit | Attending: Cardiovascular Disease | Admitting: Cardiovascular Disease

## 2022-10-27 DIAGNOSIS — R059 Cough, unspecified: Secondary | ICD-10-CM

## 2022-10-27 DIAGNOSIS — R0789 Other chest pain: Secondary | ICD-10-CM

## 2022-11-03 ENCOUNTER — Other Ambulatory Visit: Payer: Self-pay

## 2022-11-03 ENCOUNTER — Inpatient Hospital Stay (HOSPITAL_COMMUNITY)
Admission: AD | Admit: 2022-11-03 | Discharge: 2022-11-08 | DRG: 193 | Disposition: A | Discharge status: Home / Self Care | Payer: 59 | Source: Ambulatory Visit | Attending: Cardiovascular Disease | Admitting: Cardiovascular Disease

## 2022-11-03 ENCOUNTER — Encounter (HOSPITAL_COMMUNITY): Payer: Self-pay

## 2022-11-03 ENCOUNTER — Inpatient Hospital Stay (HOSPITAL_COMMUNITY): Payer: 59

## 2022-11-03 ENCOUNTER — Encounter (HOSPITAL_COMMUNITY): Payer: Self-pay | Admitting: Cardiovascular Disease

## 2022-11-03 ENCOUNTER — Other Ambulatory Visit: Payer: Self-pay | Admitting: Cardiovascular Disease

## 2022-11-03 DIAGNOSIS — N1832 Chronic kidney disease, stage 3b: Secondary | ICD-10-CM | POA: Diagnosis present

## 2022-11-03 DIAGNOSIS — Z79899 Other long term (current) drug therapy: Secondary | ICD-10-CM

## 2022-11-03 DIAGNOSIS — Z87442 Personal history of urinary calculi: Secondary | ICD-10-CM

## 2022-11-03 DIAGNOSIS — J189 Pneumonia, unspecified organism: Principal | ICD-10-CM | POA: Diagnosis present

## 2022-11-03 DIAGNOSIS — J45909 Unspecified asthma, uncomplicated: Secondary | ICD-10-CM | POA: Diagnosis present

## 2022-11-03 DIAGNOSIS — M19042 Primary osteoarthritis, left hand: Secondary | ICD-10-CM | POA: Diagnosis present

## 2022-11-03 DIAGNOSIS — F17210 Nicotine dependence, cigarettes, uncomplicated: Secondary | ICD-10-CM | POA: Diagnosis present

## 2022-11-03 DIAGNOSIS — M19012 Primary osteoarthritis, left shoulder: Secondary | ICD-10-CM | POA: Diagnosis present

## 2022-11-03 DIAGNOSIS — K219 Gastro-esophageal reflux disease without esophagitis: Secondary | ICD-10-CM | POA: Diagnosis present

## 2022-11-03 DIAGNOSIS — L4050 Arthropathic psoriasis, unspecified: Secondary | ICD-10-CM | POA: Diagnosis present

## 2022-11-03 DIAGNOSIS — M19041 Primary osteoarthritis, right hand: Secondary | ICD-10-CM | POA: Diagnosis present

## 2022-11-03 DIAGNOSIS — Z681 Body mass index (BMI) 19 or less, adult: Secondary | ICD-10-CM

## 2022-11-03 DIAGNOSIS — F418 Other specified anxiety disorders: Secondary | ICD-10-CM | POA: Diagnosis present

## 2022-11-03 DIAGNOSIS — Z1152 Encounter for screening for COVID-19: Secondary | ICD-10-CM

## 2022-11-03 DIAGNOSIS — Z7951 Long term (current) use of inhaled steroids: Secondary | ICD-10-CM

## 2022-11-03 DIAGNOSIS — E860 Dehydration: Secondary | ICD-10-CM | POA: Diagnosis present

## 2022-11-03 DIAGNOSIS — M19011 Primary osteoarthritis, right shoulder: Secondary | ICD-10-CM | POA: Diagnosis present

## 2022-11-03 DIAGNOSIS — E43 Unspecified severe protein-calorie malnutrition: Secondary | ICD-10-CM | POA: Diagnosis present

## 2022-11-03 DIAGNOSIS — R0602 Shortness of breath: Secondary | ICD-10-CM | POA: Diagnosis present

## 2022-11-03 DIAGNOSIS — I129 Hypertensive chronic kidney disease with stage 1 through stage 4 chronic kidney disease, or unspecified chronic kidney disease: Secondary | ICD-10-CM | POA: Diagnosis present

## 2022-11-03 DIAGNOSIS — E876 Hypokalemia: Secondary | ICD-10-CM | POA: Diagnosis present

## 2022-11-03 DIAGNOSIS — Z800 Family history of malignant neoplasm of digestive organs: Secondary | ICD-10-CM

## 2022-11-03 DIAGNOSIS — Z882 Allergy status to sulfonamides status: Secondary | ICD-10-CM

## 2022-11-03 DIAGNOSIS — Z809 Family history of malignant neoplasm, unspecified: Secondary | ICD-10-CM | POA: Diagnosis not present

## 2022-11-03 LAB — COMPREHENSIVE METABOLIC PANEL
ALT: 13 U/L (ref 0–44)
AST: 12 U/L — ABNORMAL LOW (ref 15–41)
Albumin: 2.6 g/dL — ABNORMAL LOW (ref 3.5–5.0)
Alkaline Phosphatase: 77 U/L (ref 38–126)
Anion gap: 11 (ref 5–15)
BUN: 20 mg/dL (ref 8–23)
CO2: 21 mmol/L — ABNORMAL LOW (ref 22–32)
Calcium: 8.8 mg/dL — ABNORMAL LOW (ref 8.9–10.3)
Chloride: 108 mmol/L (ref 98–111)
Creatinine, Ser: 2.47 mg/dL — ABNORMAL HIGH (ref 0.44–1.00)
GFR, Estimated: 21 mL/min — ABNORMAL LOW (ref >60)
Glucose, Bld: 123 mg/dL — ABNORMAL HIGH (ref 70–99)
Potassium: 2 mmol/L — CL (ref 3.5–5.1)
Sodium: 140 mmol/L (ref 135–145)
Total Bilirubin: 0.6 mg/dL (ref 0.3–1.2)
Total Protein: 7.7 g/dL (ref 6.5–8.1)

## 2022-11-03 LAB — RESP PANEL BY RT-PCR (RSV, FLU A&B, COVID)  RVPGX2
Influenza A by PCR: NEGATIVE
Influenza B by PCR: NEGATIVE
Resp Syncytial Virus by PCR: NEGATIVE
SARS Coronavirus 2 by RT PCR: NEGATIVE

## 2022-11-03 LAB — CBC WITH DIFFERENTIAL/PLATELET
Abs Immature Granulocytes: 0.28 10*3/uL — ABNORMAL HIGH (ref 0.00–0.07)
Basophils Absolute: 0.1 10*3/uL (ref 0.0–0.1)
Basophils Relative: 1 %
Eosinophils Absolute: 0.3 10*3/uL (ref 0.0–0.5)
Eosinophils Relative: 2 %
HCT: 27.4 % — ABNORMAL LOW (ref 36.0–46.0)
Hemoglobin: 8.9 g/dL — ABNORMAL LOW (ref 12.0–15.0)
Immature Granulocytes: 1 %
Lymphocytes Relative: 12 %
Lymphs Abs: 2.4 10*3/uL (ref 0.7–4.0)
MCH: 29.2 pg (ref 26.0–34.0)
MCHC: 32.5 g/dL (ref 30.0–36.0)
MCV: 89.8 fL (ref 80.0–100.0)
Monocytes Absolute: 0.7 10*3/uL (ref 0.1–1.0)
Monocytes Relative: 4 %
Neutro Abs: 15.7 10*3/uL — ABNORMAL HIGH (ref 1.7–7.7)
Neutrophils Relative %: 80 %
Platelets: 747 10*3/uL — ABNORMAL HIGH (ref 150–400)
RBC: 3.05 MIL/uL — ABNORMAL LOW (ref 3.87–5.11)
RDW: 15.5 % (ref 11.5–15.5)
WBC: 19.4 10*3/uL — ABNORMAL HIGH (ref 4.0–10.5)
nRBC: 0 % (ref 0.0–0.2)

## 2022-11-03 LAB — HIV ANTIBODY (ROUTINE TESTING W REFLEX): HIV Screen 4th Generation wRfx: NONREACTIVE

## 2022-11-03 MED ORDER — SODIUM CHLORIDE 0.9 % IV SOLN
1.0000 g | INTRAVENOUS | Status: DC
  Administered 2022-11-03 – 2022-11-07 (×5): 1 g via INTRAVENOUS
  Filled 2022-11-03 (×6): qty 10

## 2022-11-03 MED ORDER — CYCLOSPORINE 0.05 % OP EMUL: 1.0000 [drp] | Freq: Every day | OPHTHALMIC | Status: DC

## 2022-11-03 MED ORDER — ENSURE ENLIVE PO LIQD
237.0000 mL | Freq: Two times a day (BID) | ORAL | Status: DC
  Administered 2022-11-04 – 2022-11-08 (×7): 237 mL via ORAL
  Filled 2022-11-03 (×2): qty 237

## 2022-11-03 MED ORDER — BENZONATATE 100 MG PO CAPS: 100.0000 mg | ORAL_CAPSULE | Freq: Three times a day (TID) | ORAL | Status: DC

## 2022-11-03 MED ORDER — DICYCLOMINE HCL 10 MG PO CAPS
10.0000 mg | ORAL_CAPSULE | Freq: Three times a day (TID) | ORAL | Status: DC
  Administered 2022-11-04 – 2022-11-08 (×13): 10 mg via ORAL
  Filled 2022-11-03 (×14): qty 1

## 2022-11-03 MED ORDER — SODIUM CHLORIDE 0.9 % IV SOLN: INTRAVENOUS | Status: DC

## 2022-11-03 MED ORDER — ONDANSETRON 4 MG PO TBDP: 4.0000 mg | ORAL_TABLET | Freq: Three times a day (TID) | ORAL | Status: DC | PRN

## 2022-11-03 MED ORDER — AMLODIPINE BESYLATE 5 MG PO TABS
2.5000 mg | ORAL_TABLET | Freq: Every day | ORAL | Status: DC
  Administered 2022-11-04 – 2022-11-08 (×5): 2.5 mg via ORAL
  Filled 2022-11-03 (×5): qty 1

## 2022-11-03 MED ORDER — MECLIZINE HCL 25 MG PO TABS
25.0000 mg | ORAL_TABLET | Freq: Two times a day (BID) | ORAL | Status: DC
  Administered 2022-11-03 – 2022-11-08 (×9): 25 mg via ORAL
  Filled 2022-11-03 (×11): qty 1

## 2022-11-03 MED ORDER — ALBUTEROL SULFATE (2.5 MG/3ML) 0.083% IN NEBU
2.5000 mg | INHALATION_SOLUTION | Freq: Three times a day (TID) | RESPIRATORY_TRACT | Status: DC
  Administered 2022-11-03 – 2022-11-06 (×8): 2.5 mg via RESPIRATORY_TRACT
  Filled 2022-11-03 (×8): qty 3

## 2022-11-03 MED ORDER — PNEUMOCOCCAL 20-VAL CONJ VACC 0.5 ML IM SUSY
0.5000 mL | PREFILLED_SYRINGE | INTRAMUSCULAR | Status: DC
  Filled 2022-11-03: qty 0.5

## 2022-11-03 MED ORDER — ALPRAZOLAM 0.5 MG PO TABS
0.2500 mg | ORAL_TABLET | Freq: Three times a day (TID) | ORAL | Status: DC
  Administered 2022-11-03 – 2022-11-04 (×2): 0.25 mg via ORAL
  Filled 2022-11-03 (×2): qty 1

## 2022-11-03 MED ORDER — HEPARIN SODIUM (PORCINE) 5000 UNIT/ML IJ SOLN
5000.0000 [IU] | Freq: Three times a day (TID) | INTRAMUSCULAR | Status: DC
  Filled 2022-11-03: qty 1

## 2022-11-03 MED ORDER — MOMETASONE FURO-FORMOTEROL FUM 200-5 MCG/ACT IN AERO: 2.0000 | INHALATION_SPRAY | Freq: Two times a day (BID) | RESPIRATORY_TRACT | Status: DC

## 2022-11-03 MED ORDER — PANTOPRAZOLE SODIUM 40 MG PO TBEC
40.0000 mg | DELAYED_RELEASE_TABLET | Freq: Every day | ORAL | Status: DC
  Administered 2022-11-04 – 2022-11-08 (×5): 40 mg via ORAL
  Filled 2022-11-03 (×5): qty 1

## 2022-11-03 MED ORDER — ADULT MULTIVITAMIN W/MINERALS CH
1.0000 | ORAL_TABLET | Freq: Every day | ORAL | Status: DC
  Administered 2022-11-04 – 2022-11-08 (×5): 1 via ORAL
  Filled 2022-11-03 (×5): qty 1

## 2022-11-03 MED ORDER — OXYCODONE HCL 5 MG PO TABS
5.0000 mg | ORAL_TABLET | Freq: Four times a day (QID) | ORAL | Status: DC | PRN
  Administered 2022-11-03 – 2022-11-06 (×3): 5 mg via ORAL
  Filled 2022-11-03 (×3): qty 1

## 2022-11-03 MED ORDER — GABAPENTIN 300 MG PO CAPS
300.0000 mg | ORAL_CAPSULE | Freq: Every day | ORAL | Status: DC
  Administered 2022-11-03 – 2022-11-07 (×5): 300 mg via ORAL
  Filled 2022-11-03 (×5): qty 1

## 2022-11-03 MED ORDER — ALPRAZOLAM 0.5 MG PO TABS: 0.2500 mg | ORAL_TABLET | Freq: Every evening | ORAL | Status: DC | PRN

## 2022-11-03 MED ORDER — GUAIFENESIN-DM 100-10 MG/5ML PO SYRP
5.0000 mL | ORAL_SOLUTION | ORAL | Status: DC | PRN
  Administered 2022-11-03 – 2022-11-04 (×2): 5 mL via ORAL
  Filled 2022-11-03 (×2): qty 10

## 2022-11-03 MED ORDER — MAGNESIUM SULFATE 2 GM/50ML IV SOLN
2.0000 g | Freq: Once | INTRAVENOUS | Status: AC
  Administered 2022-11-03: 2 g via INTRAVENOUS
  Filled 2022-11-03: qty 50

## 2022-11-03 MED ORDER — POTASSIUM CHLORIDE 10 MEQ/100ML IV SOLN
10.0000 meq | INTRAVENOUS | Status: AC
  Administered 2022-11-03 – 2022-11-04 (×3): 10 meq via INTRAVENOUS
  Filled 2022-11-03 (×3): qty 100

## 2022-11-03 NOTE — Progress Notes (Signed)
Myself and another RN attempted IV insertion 2 times each. IV team consult order placed.

## 2022-11-03 NOTE — H&P (Signed)
Referring Physician:  [name] is an 67 y.o. female.                       Chief Complaint: Chest pain, cough and shortness of breath  HPI: 67 years old female has cough, chest pain and shortness of breath x 1 week. She also feels weakness. She has PMH of positional vertigo, Anxiety, headaches and arthritis. Chest x-ray was positive for right lung pneumonia.  Past Medical History:  Diagnosis Date   Anxiety    Arthritis    shoulders and hands   Asthma    Benign positional vertigo    Chronically dry eyes    CKD (chronic kidney disease), stage III (HCC)    Depression    Dyspnea on exertion    GERD (gastroesophageal reflux disease)    Hematuria    History of acute pyelonephritis    History of Bell's palsy    1980's left side-- residual lip numbness intermittant   History of chest pain    non-cardiac per dr Whitney Parrish notes (pt's pcp)   History of panic attacks    Hypertension    Left ureteral stone    Loose bowel movements    chronic-per pt residual from injury during abdominal surgery in 1986   Migraines    Nephrolithiasis    left    Psoriasis    Urgency of urination    Wears dentures    has upper and lower but only wears upper      Past Surgical History:  Procedure Laterality Date   BREAST CYST EXCISION  12/15/2011   Procedure: CYST EXCISION BREAST;  Surgeon: Whitney Rubenstein, MD;  Location: [facility];  Service: General;  Laterality: Left;  Excision of a Chronic Left Breast Sebaceous Cyst   CARDIOVASCULAR STRESS TEST  12-28-2012   Low risk nuclear study w/ small LV cavity which may be due LVH/  no ischemia or infarct/  normal wall motion, ef 67%   CYSTOSCOPY WITH STENT PLACEMENT Left 09/08/2015   Procedure: CYSTOSCOPY WITH LEFT STENT PLACEMENT;  Surgeon: Whitney Pippin, MD;  Location: [facility];  Service: Urology;  Laterality: Left;   CYSTOSCOPY WITH URETEROSCOPY AND STENT PLACEMENT Left 09/25/2015   Procedure: LEFT  URETEROSCOPY STONE EXTRACTION;  Surgeon: Whitney Pippin, MD;   Location: [facility];  Service: Urology;  Laterality: Left;   EXCISION AND REVISION SCAR ABDOMINAL WALL  01-22-2002   EXPLORATORY LAPAROTOMY  1986   w/ Abdominal Myomectomy  (per pt bowel muscle cut (injury))   HOLMIUM LASER APPLICATION Left 09/25/2015   Procedure: HOLMIUM LASER APPLICATION;  Surgeon: Whitney Pippin, MD;  Location: [facility];  Service: Urology;  Laterality: Left;   I & D LEFT BUTTOCK ABSCESS   07-01-2010   x2 area's   NEGATIVE SLEEP STUDY  08-10-2005   SHOULDER ARTHROSCOPY WITH SUBACROMIAL DECOMPRESSION, ROTATOR CUFF REPAIR AND BICEP TENDON REPAIR Right 11/12/2021   Procedure: SHOULDER ARTHROSCOPY WITH SUBACROMIAL DECOMPRESSION, ROTATOR CUFF REPAIR AND BICEP TENDON REPAIR;  Surgeon: Whitney Pippin, MD;  [facility];  Service: Orthopedics;  Laterality: Right;   TRANSTHORACIC ECHOCARDIOGRAM  12-28-2012   grade 1 diastolic dysfunction, mild concentric LVH, ef 65-70%/  mild MR/  trivial PR and TR    Family History  Problem Relation Age of Onset   Cancer Mother        Stomach   Cancer Father        Unknown  Social History:  reports that she has been smoking cigarettes. She has a 7.50 pack-year smoking history. She has never used smokeless tobacco. She reports that she does not drink alcohol and does not use drugs.  Allergies:  Allergies  Allergen Reactions   Sulfa Antibiotics Other (See Comments)    hallucinations    Medications Prior to Admission  Medication Sig Dispense Refill   albuterol (PROVENTIL HFA;VENTOLIN HFA) 108 (90 Base) MCG/ACT inhaler Inhale 2 puffs into the lungs 3 (three) times daily.     ALPRAZolam (XANAX) 0.25 MG tablet Take 0.25 mg by mouth at bedtime as needed for anxiety or sleep.      amLODipine (NORVASC) 2.5 MG tablet Take 2.5 mg by mouth daily.     benzonatate (TESSALON) 100 MG capsule Take 1 capsule (100 mg total) by mouth every 8 (eight) hours. 21 capsule 0   budesonide-formoterol (SYMBICORT)  160-4.5 MCG/ACT inhaler Inhale 2 puffs into the lungs 2 (two) times daily.     cycloSPORINE (RESTASIS) 0.05 % ophthalmic emulsion Place 1 drop into both eyes daily.     diclofenac (VOLTAREN) 75 MG EC tablet Take 1 tablet (75 mg total) by mouth 2 (two) times daily. 60 tablet 0   dicyclomine (BENTYL) 10 MG capsule Take 10 mg by mouth in the morning and at bedtime.     gabapentin (NEURONTIN) 300 MG capsule Take 300 mg by mouth at bedtime.     Guselkumab 100 MG/ML SOPN Inject 100 mg into the skin See admin instructions. Every 8 weeks  Please resume only after talking with your primary care MD 0.28 mL    meclizine (ANTIVERT) 25 MG tablet Take 25 mg by mouth 2 (two) times daily.     Multiple Vitamins-Minerals (CENTRUM SILVER 50+WOMEN PO) Take 1 tablet by mouth daily.     nitroGLYCERIN (NITROSTAT) 0.4 MG SL tablet Place 1 tablet (0.4 mg total) under the tongue every 5 (five) minutes x 3 doses as needed for chest pain. (Patient taking differently: Place 0.4 mg under the tongue every 5 (five) minutes x 3 doses as needed for chest pain. Per patient hasn't used in over a year) 25 tablet 2   omeprazole (PRILOSEC) 20 MG capsule Take 40 mg by mouth daily.     ondansetron (ZOFRAN-ODT) 4 MG disintegrating tablet Take 1 tablet (4 mg total) by mouth every 8 (eight) hours as needed for nausea or vomiting. 20 tablet 0    No results found for this or any previous visit (from the past 48 hour(s)). No results found.  Review Of Systems Constitutional: No fever, chills, positive  weight loss. Eyes: No vision change, wears glasses. No discharge or pain. Ears: No hearing loss, Positive tinnitus. Respiratory: No asthma, COPD, positive pneumonias, shortness of breath. No hemoptysis. Cardiovascular: Positive chest pain, no palpitation, no leg edema. Gastrointestinal: Posiotive nausea, vomiting, diarrhea, constipation. No GI bleed. No hepatitis. Genitourinary: No dysuria, hematuria, kidney stone. No  incontinance. Neurological: Positive headache, no stroke, seizures.  Psychiatry: No psych facility admission for anxiety, depression, suicide. No detox. Skin: No rash. Musculoskeletal: Positive joint pain, positive fibromyalgia. Positive neck pain, back pain. Lymphadenopathy: No lymphadenopathy. Hematology: No anemia or easy bruising.   There were no vitals taken for this visit. There is no height or weight on file to calculate BMI. General appearance: alert, cooperative, appears stated age and mild to moderate respiratory distress Head: Normocephalic, atraumatic. Eyes: Brown eyes, pink conjunctiva, corneas clear.  Neck: No adenopathy, no carotid bruit, no JVD, supple, symmetrical,  trachea midline and thyroid not enlarged. Resp: Coarse to auscultation bilaterally. Cardio: Regular rate and rhythm, S1, S2 normal, II/VI systolic murmur, no click, rub or gallop GI: Soft, non-tender; bowel sounds normal; no organomegaly. Extremities: No edema, cyanosis or clubbing. Skin: Warm and dry.  Neurologic: Alert and oriented X 3, normal strength. Normal coordination and slow gait.  Assessment/Plan Community acquired pneumonia Weakness Dizziness Chronic myalgia Psoriatic arthritis Chronic anxiety and depression CKD, II  Plan: Admit IV antibiotics. IV fluids. Home medications  Time spent: Review of old records, Lab, x-rays, EKG, other cardiac tests, examination, discussion with patient over 70 minutes.  Ricki Rodriguez, MD  11/03/2022, 5:24 PM

## 2022-11-03 NOTE — Progress Notes (Addendum)
Patient refused heparin at this time, stated that she will discuss with the doctor in the AM.

## 2022-11-04 LAB — CULTURE, BLOOD (ROUTINE X 2): Special Requests: ADEQUATE

## 2022-11-04 LAB — BASIC METABOLIC PANEL
Anion gap: 10 (ref 5–15)
BUN: 19 mg/dL (ref 8–23)
CO2: 20 mmol/L — ABNORMAL LOW (ref 22–32)
Calcium: 7.9 mg/dL — ABNORMAL LOW (ref 8.9–10.3)
Chloride: 111 mmol/L (ref 98–111)
Creatinine, Ser: 2.25 mg/dL — ABNORMAL HIGH (ref 0.44–1.00)
GFR, Estimated: 23 mL/min — ABNORMAL LOW (ref >60)
Glucose, Bld: 97 mg/dL (ref 70–99)
Potassium: 2.7 mmol/L — CL (ref 3.5–5.1)
Sodium: 141 mmol/L (ref 135–145)

## 2022-11-04 LAB — MAGNESIUM: Magnesium: 2.2 mg/dL (ref 1.7–2.4)

## 2022-11-04 MED ORDER — MIRTAZAPINE 15 MG PO TABS
7.5000 mg | ORAL_TABLET | Freq: Every day | ORAL | Status: DC
  Administered 2022-11-04 – 2022-11-08 (×5): 7.5 mg via ORAL
  Filled 2022-11-04 (×5): qty 1

## 2022-11-04 MED ORDER — POTASSIUM CHLORIDE 10 MEQ/100ML IV SOLN
10.0000 meq | INTRAVENOUS | Status: AC
  Administered 2022-11-04 (×6): 10 meq via INTRAVENOUS
  Filled 2022-11-04 (×3): qty 100

## 2022-11-04 MED ORDER — BUSPIRONE HCL 10 MG PO TABS
10.0000 mg | ORAL_TABLET | Freq: Two times a day (BID) | ORAL | Status: DC
  Administered 2022-11-04 – 2022-11-08 (×9): 10 mg via ORAL
  Filled 2022-11-04 (×9): qty 1

## 2022-11-04 MED ORDER — QUETIAPINE FUMARATE 25 MG PO TABS
25.0000 mg | ORAL_TABLET | Freq: Every day | ORAL | Status: DC
  Administered 2022-11-04 – 2022-11-07 (×4): 25 mg via ORAL
  Filled 2022-11-04 (×4): qty 1

## 2022-11-04 MED ORDER — GUAIFENESIN-DM 100-10 MG/5ML PO SYRP
5.0000 mL | ORAL_SOLUTION | ORAL | Status: DC
  Administered 2022-11-04 – 2022-11-08 (×19): 5 mL via ORAL
  Filled 2022-11-04 (×19): qty 10

## 2022-11-04 MED ORDER — POTASSIUM CHLORIDE CRYS ER 10 MEQ PO TBCR
10.0000 meq | EXTENDED_RELEASE_TABLET | Freq: Two times a day (BID) | ORAL | Status: DC
  Administered 2022-11-04 – 2022-11-05 (×3): 10 meq via ORAL
  Filled 2022-11-04 (×3): qty 1

## 2022-11-04 MED ORDER — MOMETASONE FURO-FORMOTEROL FUM 200-5 MCG/ACT IN AERO
1.0000 | INHALATION_SPRAY | Freq: Every day | RESPIRATORY_TRACT | Status: DC
  Administered 2022-11-05 – 2022-11-08 (×4): 1 via RESPIRATORY_TRACT
  Filled 2022-11-04: qty 8.8

## 2022-11-04 MED ORDER — METOCLOPRAMIDE HCL 5 MG PO TABS
5.0000 mg | ORAL_TABLET | Freq: Three times a day (TID) | ORAL | Status: DC
  Administered 2022-11-04 – 2022-11-08 (×12): 5 mg via ORAL
  Filled 2022-11-04 (×12): qty 1

## 2022-11-04 MED ORDER — POTASSIUM CHLORIDE 10 MEQ/100ML IV SOLN
10.0000 meq | INTRAVENOUS | Status: AC
  Administered 2022-11-04: 10 meq via INTRAVENOUS
  Filled 2022-11-04: qty 100

## 2022-11-04 NOTE — Progress Notes (Signed)
Ref: [name], MD   Subjective:  Feels little better. Cough and chest pain continues. T max 99.1 degree F. Monitor- sinus rhythm. Hypokalemia continues. Magnesium level is normal.  Objective:  Vital Signs in the last 24 hours: Temp:  [98 F (36.7 C)-99.1 F (37.3 C)] 98 F (36.7 C) (07/04 0533) Pulse Rate:  [71-85] 72 (07/04 0742) Cardiac Rhythm: Normal sinus rhythm (07/04 0700) Resp:  [15-16] 16 (07/04 0533) BP: (106-132)/(59-87) 128/67 (07/04 0742) SpO2:  [99 %-100 %] 99 % (07/04 0748)  Physical Exam: BP Readings from Last 1 Encounters:  11/04/22 128/67     Wt Readings from Last 1 Encounters:  05/21/22 44 kg    Weight change:  There is no height or weight on file to calculate BMI. HEENT: [place]/AT, Eyes-Brown, Conjunctiva-Pink, Sclera-Non-icteric Neck: No JVD, No bruit, Trachea midline. Lungs:  Coarse crackles, Bilateral. Cardiac:  Regular rhythm, normal S1 and S2, no S3. II/VI systolic murmur. Abdomen:  Soft, non-tender. BS present. Extremities:  No edema present. No cyanosis. No clubbing. CNS: AxOx3, Cranial nerves grossly intact, moves all 4 extremities.  Skin: Warm and dry.   Intake/Output from previous day: 07/03 0701 - 07/04 0700 In: 902.3 [I.V.:373; IV Piggyback:529.3] Out: -     Lab Results: BMET    Component Value Date/Time   NA 141 11/04/2022 0223   NA 140 11/03/2022 1753   NA 142 07/12/2020 0141   K 2.7 (LL) 11/04/2022 0223   K 2.0 (LL) 11/03/2022 1753   K 4.0 07/12/2020 0141   CL 111 11/04/2022 0223   CL 108 11/03/2022 1753   CL 112 (H) 07/12/2020 0141   CO2 20 (L) 11/04/2022 0223   CO2 21 (L) 11/03/2022 1753   CO2 23 07/12/2020 0141   GLUCOSE 97 11/04/2022 0223   GLUCOSE 123 (H) 11/03/2022 1753   GLUCOSE 120 (H) 07/12/2020 0141   BUN 19 11/04/2022 0223   BUN 20 11/03/2022 1753   BUN 8 07/12/2020 0141   CREATININE 2.25 (H) 11/04/2022 0223   CREATININE 2.47 (H) 11/03/2022 1753   CREATININE 1.50 (H) 07/12/2020 0141   CALCIUM 7.9 (L)  11/04/2022 0223   CALCIUM 8.8 (L) 11/03/2022 1753   CALCIUM 9.2 07/12/2020 0141   GFRNONAA 23 (L) 11/04/2022 0223   GFRNONAA 21 (L) 11/03/2022 1753   GFRNONAA 38 (L) 07/12/2020 0141   GFRAA 29 (L) 04/16/2018 1750   GFRAA 32 (L) 04/11/2018 0931   GFRAA 19 (L) 09/23/2017 1200   CBC    Component Value Date/Time   WBC 19.4 (H) 11/03/2022 1753   RBC 3.05 (L) 11/03/2022 1753   HGB 8.9 (L) 11/03/2022 1753   HCT 27.4 (L) 11/03/2022 1753   PLT 747 (H) 11/03/2022 1753   MCV 89.8 11/03/2022 1753   MCH 29.2 11/03/2022 1753   MCHC 32.5 11/03/2022 1753   RDW 15.5 11/03/2022 1753   LYMPHSABS 2.4 11/03/2022 1753   MONOABS 0.7 11/03/2022 1753   EOSABS 0.3 11/03/2022 1753   BASOSABS 0.1 11/03/2022 1753   HEPATIC Function Panel Recent Labs    11/03/22 1753  PROT 7.7  ALBUMIN 2.6*  AST 12*  ALT 13  ALKPHOS 77   HEMOGLOBIN A1C No results found for: "MPG" CARDIAC ENZYMES Lab Results  Component Value Date   CKTOTAL 364 (H) 11/16/2011   CKMB 8.0 (HH) 11/16/2011   TROPONINI <0.30 12/27/2012   TROPONINI <0.30 12/26/2012   TROPONINI <0.30 12/26/2012   BNP No results for input(s): "PROBNP" in the last 8760 hours. TSH No  results for input(s): "TSH" in the last 8760 hours. CHOLESTEROL No results for input(s): "CHOL" in the last 8760 hours.  Scheduled Meds:  albuterol  2.5 mg Inhalation TID   amLODipine  2.5 mg Oral Daily   busPIRone  10 mg Oral BID   dicyclomine  10 mg Oral TID AC   feeding supplement  237 mL Oral BID BM   gabapentin  300 mg Oral QHS   guaiFENesin-dextromethorphan  5 mL Oral Q4H while awake   meclizine  25 mg Oral BID   metoCLOPramide  5 mg Oral TID AC   mirtazapine  7.5 mg Oral Daily   mometasone-formoterol  1 puff Inhalation Daily   multivitamin with minerals  1 tablet Oral Daily   pantoprazole  40 mg Oral Daily   pneumococcal 20-valent conjugate vaccine  0.5 mL Intramuscular Tomorrow-1000   potassium chloride  10 mEq Oral BID   QUEtiapine  25 mg Oral QHS    Continuous Infusions:  sodium chloride 50 mL/hr at 11/04/22 0430   cefTRIAXone (ROCEPHIN)  IV Stopped (11/04/22 0430)   potassium chloride 10 mEq (11/04/22 0912)   PRN Meds:.ondansetron, oxyCODONE  Assessment/Plan: Community acquired pneumonia Weakness Dizziness Chronic myalgia Psoriatic arthritis Chronic anxiety and depression CKD, IV Dehydration Severe hypokalemia  Plan: Continue IV antibiotics. Change Xanax to Buspar. Continue potassium supplements   LOS: 1 day   Time spent including chart review, lab review, examination, discussion with patient/Pharmacy/Nurse/Family : 30 min   [name]  MD  11/04/2022, 9:37 AM

## 2022-11-05 ENCOUNTER — Inpatient Hospital Stay (HOSPITAL_COMMUNITY): Payer: 59

## 2022-11-05 LAB — BASIC METABOLIC PANEL
Anion gap: 6 (ref 5–15)
BUN: 19 mg/dL (ref 8–23)
CO2: 23 mmol/L (ref 22–32)
Calcium: 7.9 mg/dL — ABNORMAL LOW (ref 8.9–10.3)
Chloride: 116 mmol/L — ABNORMAL HIGH (ref 98–111)
Creatinine, Ser: 1.86 mg/dL — ABNORMAL HIGH (ref 0.44–1.00)
GFR, Estimated: 29 mL/min — ABNORMAL LOW (ref >60)
Glucose, Bld: 78 mg/dL (ref 70–99)
Potassium: 3.2 mmol/L — ABNORMAL LOW (ref 3.5–5.1)
Sodium: 145 mmol/L (ref 135–145)

## 2022-11-05 LAB — CULTURE, BLOOD (ROUTINE X 2): Culture: NO GROWTH

## 2022-11-05 MED ORDER — POTASSIUM CHLORIDE CRYS ER 10 MEQ PO TBCR
10.0000 meq | EXTENDED_RELEASE_TABLET | Freq: Three times a day (TID) | ORAL | Status: DC
  Administered 2022-11-05 – 2022-11-07 (×6): 10 meq via ORAL
  Filled 2022-11-05 (×6): qty 1

## 2022-11-05 NOTE — Plan of Care (Signed)

## 2022-11-05 NOTE — Progress Notes (Signed)
   11/05/22 1100  Spiritual Encounters  Type of Visit Initial  Care provided to: Patient  Conversation partners present during encounter Nurse  Referral source Patient request  Reason for visit Advance directives  OnCall Visit No   Ch responded to request for AD. There was no family present. Ch assisted pt with AD education. Pt will page Ch when AD is completed. No follow-up needed at this time.

## 2022-11-05 NOTE — Progress Notes (Signed)
  Echocardiogram 2D Echocardiogram has been performed.  Whitney Parrish 11/05/2022, 5:45 PM

## 2022-11-05 NOTE — Progress Notes (Signed)
Ref: Whitney Cobb, MD   Subjective:  Cough and chest pain continues. VS stable. Hypokalemia improving.  Objective:  Vital Signs in the last 24 hours: Temp:  [98.5 F (36.9 C)-98.7 F (37.1 C)] 98.5 F (36.9 C) (07/05 0735) Pulse Rate:  [77-90] 77 (07/05 0750) Cardiac Rhythm: Normal sinus rhythm (07/05 0700) Resp:  [18] 18 (07/05 0750) BP: (117-121)/(69-81) 117/71 (07/05 0735) SpO2:  [98 %-100 %] 98 % (07/05 0750)  Physical Exam: BP Readings from Last 1 Encounters:  11/05/22 117/71     Wt Readings from Last 1 Encounters:  05/21/22 44 kg    Weight change:  There is no height or weight on file to calculate BMI. HEENT: [place]/AT, Eyes-Brown, Conjunctiva-Pink, Sclera-Non-icteric Neck: No JVD, No bruit, Trachea midline. Lungs:  Clear, Bilateral. Cardiac:  Regular rhythm, normal S1 and S2, no S3. II/VI systolic murmur. Abdomen:  Soft, non-tender. BS present. Extremities:  No edema present. No cyanosis. No clubbing. CNS: AxOx3, Cranial nerves grossly intact, moves all 4 extremities.  Skin: Warm and dry.   Intake/Output from previous day: 07/04 0701 - 07/05 0700 In: 340 [P.O.:240; IV Piggyback:100] Out: -     Lab Results: BMET    Component Value Date/Time   NA 145 11/05/2022 0357   NA 141 11/04/2022 0223   NA 140 11/03/2022 1753   K 3.2 (L) 11/05/2022 0357   K 2.7 (LL) 11/04/2022 0223   K 2.0 (LL) 11/03/2022 1753   CL 116 (H) 11/05/2022 0357   CL 111 11/04/2022 0223   CL 108 11/03/2022 1753   CO2 23 11/05/2022 0357   CO2 20 (L) 11/04/2022 0223   CO2 21 (L) 11/03/2022 1753   GLUCOSE 78 11/05/2022 0357   GLUCOSE 97 11/04/2022 0223   GLUCOSE 123 (H) 11/03/2022 1753   BUN 19 11/05/2022 0357   BUN 19 11/04/2022 0223   BUN 20 11/03/2022 1753   CREATININE 1.86 (H) 11/05/2022 0357   CREATININE 2.25 (H) 11/04/2022 0223   CREATININE 2.47 (H) 11/03/2022 1753   CALCIUM 7.9 (L) 11/05/2022 0357   CALCIUM 7.9 (L) 11/04/2022 0223   CALCIUM 8.8 (L) 11/03/2022 1753   GFRNONAA  29 (L) 11/05/2022 0357   GFRNONAA 23 (L) 11/04/2022 0223   GFRNONAA 21 (L) 11/03/2022 1753   GFRAA 29 (L) 04/16/2018 1750   GFRAA 32 (L) 04/11/2018 0931   GFRAA 19 (L) 09/23/2017 1200   CBC    Component Value Date/Time   WBC 19.4 (H) 11/03/2022 1753   RBC 3.05 (L) 11/03/2022 1753   HGB 8.9 (L) 11/03/2022 1753   HCT 27.4 (L) 11/03/2022 1753   PLT 747 (H) 11/03/2022 1753   MCV 89.8 11/03/2022 1753   MCH 29.2 11/03/2022 1753   MCHC 32.5 11/03/2022 1753   RDW 15.5 11/03/2022 1753   LYMPHSABS 2.4 11/03/2022 1753   MONOABS 0.7 11/03/2022 1753   EOSABS 0.3 11/03/2022 1753   BASOSABS 0.1 11/03/2022 1753   HEPATIC Function Panel Recent Labs    11/03/22 1753  PROT 7.7  ALBUMIN 2.6*  AST 12*  ALT 13  ALKPHOS 77   HEMOGLOBIN A1C No results found for: "MPG" CARDIAC ENZYMES Lab Results  Component Value Date   CKTOTAL 364 (H) 11/16/2011   CKMB 8.0 (HH) 11/16/2011   TROPONINI <0.30 12/27/2012   TROPONINI <0.30 12/26/2012   TROPONINI <0.30 12/26/2012   BNP No results for input(s): "PROBNP" in the last 8760 hours. TSH No results for input(s): "TSH" in the last 8760 hours. CHOLESTEROL No results for  input(s): "CHOL" in the last 8760 hours.  Scheduled Meds:  albuterol  2.5 mg Inhalation TID   amLODipine  2.5 mg Oral Daily   busPIRone  10 mg Oral BID   dicyclomine  10 mg Oral TID AC   feeding supplement  237 mL Oral BID BM   gabapentin  300 mg Oral QHS   guaiFENesin-dextromethorphan  5 mL Oral Q4H while awake   meclizine  25 mg Oral BID   metoCLOPramide  5 mg Oral TID AC   mirtazapine  7.5 mg Oral Daily   mometasone-formoterol  1 puff Inhalation Daily   multivitamin with minerals  1 tablet Oral Daily   pantoprazole  40 mg Oral Daily   pneumococcal 20-valent conjugate vaccine  0.5 mL Intramuscular Tomorrow-1000   potassium chloride  10 mEq Oral TID   QUEtiapine  25 mg Oral QHS   Continuous Infusions:  sodium chloride 50 mL/hr at 11/04/22 0430   cefTRIAXone (ROCEPHIN)   IV Stopped (11/04/22 1837)   PRN Meds:.ondansetron, oxyCODONE  Assessment/Plan: Community acquired pneumonia Weakness Dizziness Chronic myalgia Psoriatic arthritis Chronic anxiety and depression CKD, IV Dehydration Severe hypokalemia, improving  Plan: Continue IV antibiotics. Continue potassium supplement. Increase activity.   LOS: 2 days   Time spent including chart review, lab review, examination, discussion with patient/Nurse : 30 min   Whitney Cobb  MD  11/05/2022, 11:07 AM

## 2022-11-06 LAB — ECHOCARDIOGRAM COMPLETE
Area-P 1/2: 2.95 cm2
S' Lateral: 2.3 cm

## 2022-11-06 LAB — BASIC METABOLIC PANEL
Anion gap: 8 (ref 5–15)
BUN: 14 mg/dL (ref 8–23)
CO2: 17 mmol/L — ABNORMAL LOW (ref 22–32)
Calcium: 7.8 mg/dL — ABNORMAL LOW (ref 8.9–10.3)
Chloride: 118 mmol/L — ABNORMAL HIGH (ref 98–111)
Creatinine, Ser: 1.66 mg/dL — ABNORMAL HIGH (ref 0.44–1.00)
GFR, Estimated: 34 mL/min — ABNORMAL LOW (ref >60)
Glucose, Bld: 88 mg/dL (ref 70–99)
Potassium: 3.3 mmol/L — ABNORMAL LOW (ref 3.5–5.1)
Sodium: 143 mmol/L (ref 135–145)

## 2022-11-06 LAB — CULTURE, BLOOD (ROUTINE X 2)

## 2022-11-06 MED ORDER — ALBUTEROL SULFATE (2.5 MG/3ML) 0.083% IN NEBU
2.5000 mg | INHALATION_SOLUTION | Freq: Two times a day (BID) | RESPIRATORY_TRACT | Status: DC
  Administered 2022-11-07 – 2022-11-08 (×3): 2.5 mg via RESPIRATORY_TRACT
  Filled 2022-11-06 (×3): qty 3

## 2022-11-06 MED ORDER — NICOTINE 21 MG/24HR TD PT24
21.0000 mg | MEDICATED_PATCH | Freq: Every day | TRANSDERMAL | Status: DC
  Administered 2022-11-06 – 2022-11-08 (×3): 21 mg via TRANSDERMAL
  Filled 2022-11-06 (×3): qty 1

## 2022-11-06 NOTE — Plan of Care (Signed)
A/Ox4 and on room air. No complaints of pain this shift. Self care. Continues to have a nonproductive cough. Fiance at bedside and helpful in care. No overnight issues.    Problem: Activity: Goal: Ability to tolerate increased activity will improve Outcome: Progressing   Problem: Clinical Measurements: Goal: Ability to maintain a body temperature in the normal range will improve Outcome: Progressing   Problem: Respiratory: Goal: Ability to maintain adequate ventilation will improve Outcome: Progressing Goal: Ability to maintain a clear airway will improve Outcome: Progressing   Problem: Education: Goal: Knowledge of General Education information will improve Description: Including pain rating scale, medication(s)/side effects and non-pharmacologic comfort measures Outcome: Progressing   Problem: Health Behavior/Discharge Planning: Goal: Ability to manage health-related needs will improve Outcome: Progressing   Problem: Clinical Measurements: Goal: Ability to maintain clinical measurements within normal limits will improve Outcome: Progressing Goal: Will remain free from infection Outcome: Progressing Goal: Diagnostic test results will improve Outcome: Progressing Goal: Respiratory complications will improve Outcome: Progressing Goal: Cardiovascular complication will be avoided Outcome: Progressing   Problem: Activity: Goal: Risk for activity intolerance will decrease Outcome: Progressing   Problem: Nutrition: Goal: Adequate nutrition will be maintained Outcome: Progressing   Problem: Coping: Goal: Level of anxiety will decrease Outcome: Progressing   Problem: Elimination: Goal: Will not experience complications related to bowel motility Outcome: Progressing Goal: Will not experience complications related to urinary retention Outcome: Progressing   Problem: Pain Managment: Goal: General experience of comfort will improve Outcome: Progressing   Problem:  Safety: Goal: Ability to remain free from injury will improve Outcome: Progressing   Problem: Skin Integrity: Goal: Risk for impaired skin integrity will decrease Outcome: Progressing

## 2022-11-06 NOTE — Progress Notes (Signed)
Ref: [name], MD   Subjective:  Awake. Slow and steady improvement. Cough persist. Mild respiratory distress continues.  Objective:  Vital Signs in the last 24 hours: Temp:  [97.9 F (36.6 C)-98.5 F (36.9 C)] 98.5 F (36.9 C) (07/06 0720) Pulse Rate:  [73-88] 80 (07/06 0720) Resp:  [18] 18 (07/06 0720) BP: (120-130)/(72-84) 120/83 (07/06 0720) SpO2:  [97 %-100 %] 97 % (07/06 0816)  Physical Exam: BP Readings from Last 1 Encounters:  11/06/22 120/83     Wt Readings from Last 1 Encounters:  05/21/22 44 kg    Weight change:  There is no height or weight on file to calculate BMI. HEENT: [place]/AT, Eyes-Brown,  Conjunctiva-Pink, Sclera-Non-icteric Neck: No JVD, No bruit, Trachea midline. Lungs:  Clearing, Bilateral. Cardiac:  Regular rhythm, normal S1 and S2, no S3. II/VI systolic murmur. Abdomen:  Soft, non-tender. BS present. Extremities:  No edema present. No cyanosis. No clubbing. CNS: AxOx3, Cranial nerves grossly intact, moves all 4 extremities.  Skin: Warm and dry.   Intake/Output from previous day: No intake/output data recorded.    Lab Results: BMET    Component Value Date/Time   NA 143 11/06/2022 0419   NA 145 11/05/2022 0357   NA 141 11/04/2022 0223   K 3.3 (L) 11/06/2022 0419   K 3.2 (L) 11/05/2022 0357   K 2.7 (LL) 11/04/2022 0223   CL 118 (H) 11/06/2022 0419   CL 116 (H) 11/05/2022 0357   CL 111 11/04/2022 0223   CO2 17 (L) 11/06/2022 0419   CO2 23 11/05/2022 0357   CO2 20 (L) 11/04/2022 0223   GLUCOSE 88 11/06/2022 0419   GLUCOSE 78 11/05/2022 0357   GLUCOSE 97 11/04/2022 0223   BUN 14 11/06/2022 0419   BUN 19 11/05/2022 0357   BUN 19 11/04/2022 0223   CREATININE 1.66 (H) 11/06/2022 0419   CREATININE 1.86 (H) 11/05/2022 0357   CREATININE 2.25 (H) 11/04/2022 0223   CALCIUM 7.8 (L) 11/06/2022 0419   CALCIUM 7.9 (L) 11/05/2022 0357   CALCIUM 7.9 (L) 11/04/2022 0223   GFRNONAA 34 (L) 11/06/2022 0419   GFRNONAA 29 (L) 11/05/2022 0357    GFRNONAA 23 (L) 11/04/2022 0223   GFRAA 29 (L) 04/16/2018 1750   GFRAA 32 (L) 04/11/2018 0931   GFRAA 19 (L) 09/23/2017 1200   CBC    Component Value Date/Time   WBC 19.4 (H) 11/03/2022 1753   RBC 3.05 (L) 11/03/2022 1753   HGB 8.9 (L) 11/03/2022 1753   HCT 27.4 (L) 11/03/2022 1753   PLT 747 (H) 11/03/2022 1753   MCV 89.8 11/03/2022 1753   MCH 29.2 11/03/2022 1753   MCHC 32.5 11/03/2022 1753   RDW 15.5 11/03/2022 1753   LYMPHSABS 2.4 11/03/2022 1753   MONOABS 0.7 11/03/2022 1753   EOSABS 0.3 11/03/2022 1753   BASOSABS 0.1 11/03/2022 1753   HEPATIC Function Panel Recent Labs    11/03/22 1753  PROT 7.7  ALBUMIN 2.6*  AST 12*  ALT 13  ALKPHOS 77   HEMOGLOBIN A1C No results found for: "MPG" CARDIAC ENZYMES Lab Results  Component Value Date   CKTOTAL 364 (H) 11/16/2011   CKMB 8.0 (HH) 11/16/2011   TROPONINI <0.30 12/27/2012   TROPONINI <0.30 12/26/2012   TROPONINI <0.30 12/26/2012   BNP No results for input(s): "PROBNP" in the last 8760 hours. TSH No results for input(s): "TSH" in the last 8760 hours. CHOLESTEROL No results for input(s): "CHOL" in the last 8760 hours.  Scheduled Meds:  albuterol  2.5 mg Inhalation TID   amLODipine  2.5 mg Oral Daily   busPIRone  10 mg Oral BID   dicyclomine  10 mg Oral TID AC   feeding supplement  237 mL Oral BID BM   gabapentin  300 mg Oral QHS   guaiFENesin-dextromethorphan  5 mL Oral Q4H while awake   meclizine  25 mg Oral BID   metoCLOPramide  5 mg Oral TID AC   mirtazapine  7.5 mg Oral Daily   mometasone-formoterol  1 puff Inhalation Daily   multivitamin with minerals  1 tablet Oral Daily   nicotine  21 mg Transdermal Daily   pantoprazole  40 mg Oral Daily   pneumococcal 20-valent conjugate vaccine  0.5 mL Intramuscular Tomorrow-1000   potassium chloride  10 mEq Oral TID   QUEtiapine  25 mg Oral QHS   Continuous Infusions:  sodium chloride 50 mL/hr at 11/05/22 1704   cefTRIAXone (ROCEPHIN)  IV 1 g (11/05/22 1808)    PRN Meds:.ondansetron, oxyCODONE  Assessment/Plan: Community acquired pneumonia Weakness Dizziness Chronic myalgia Psoriatic arthritis Chronic anxiety and depression CKD, IV Dehydration Severe hypokalemia, improving Tobacco use disorder  Plan: Continue IV antibiotics. Increase activity. Nicotine patch for tobacco use disorder and anxiety. Continue potassium supplement.   LOS: 3 days   Time spent including chart review, lab review, examination, discussion with patient/Nurse : 30 min   [name]  MD  11/06/2022, 11:30 AM

## 2022-11-07 LAB — BASIC METABOLIC PANEL
Anion gap: 16 — ABNORMAL HIGH (ref 5–15)
BUN: 12 mg/dL (ref 8–23)
CO2: 17 mmol/L — ABNORMAL LOW (ref 22–32)
Calcium: 8.4 mg/dL — ABNORMAL LOW (ref 8.9–10.3)
Chloride: 112 mmol/L — ABNORMAL HIGH (ref 98–111)
Creatinine, Ser: 1.73 mg/dL — ABNORMAL HIGH (ref 0.44–1.00)
GFR, Estimated: 32 mL/min — ABNORMAL LOW (ref >60)
Glucose, Bld: 81 mg/dL (ref 70–99)
Potassium: 3.1 mmol/L — ABNORMAL LOW (ref 3.5–5.1)
Sodium: 145 mmol/L (ref 135–145)

## 2022-11-07 MED ORDER — POTASSIUM CHLORIDE 10 MEQ/100ML IV SOLN
10.0000 meq | INTRAVENOUS | Status: AC
  Administered 2022-11-07 (×2): 10 meq via INTRAVENOUS
  Filled 2022-11-07 (×2): qty 100

## 2022-11-07 MED ORDER — POTASSIUM CHLORIDE CRYS ER 20 MEQ PO TBCR
20.0000 meq | EXTENDED_RELEASE_TABLET | Freq: Three times a day (TID) | ORAL | Status: DC
  Administered 2022-11-07 – 2022-11-08 (×3): 20 meq via ORAL
  Filled 2022-11-07 (×3): qty 1

## 2022-11-07 NOTE — Progress Notes (Signed)
Ref: [name], MD   Subjective:  Awake. Mild respiratory distress and cough. Hypokalemia persist. Appetite is improving per patient and friend.  Objective:  Vital Signs in the last 24 hours: Temp:  [98.4 F (36.9 C)-98.6 F (37 C)] 98.5 F (36.9 C) (07/07 0533) Pulse Rate:  [78-87] 78 (07/07 0533) Resp:  [18-20] 20 (07/07 0533) BP: (124-129)/(74-94) 124/74 (07/07 0533) SpO2:  [99 %-100 %] 99 % (07/07 0747)  Physical Exam: BP Readings from Last 1 Encounters:  11/07/22 124/74     Wt Readings from Last 1 Encounters:  05/21/22 44 kg    Weight change:  There is no height or weight on file to calculate BMI. HEENT: [place]/AT, Eyes-Brown,  Conjunctiva-Pink, Sclera-Non-icteric Neck: No JVD, No bruit, Trachea midline. Lungs:  Clearing, Bilateral. Cardiac:  Regular rhythm, normal S1 and S2, no S3. II/VI systolic murmur. Abdomen:  Soft, non-tender. BS present. Extremities:  No edema present. No cyanosis. No clubbing. CNS: AxOx3, Cranial nerves grossly intact, moves all 4 extremities.  Skin: Warm and dry.   Intake/Output from previous day: No intake/output data recorded.    Lab Results: BMET    Component Value Date/Time   NA 145 11/07/2022 1118   NA 143 11/06/2022 0419   NA 145 11/05/2022 0357   K 3.1 (L) 11/07/2022 1118   K 3.3 (L) 11/06/2022 0419   K 3.2 (L) 11/05/2022 0357   CL 112 (H) 11/07/2022 1118   CL 118 (H) 11/06/2022 0419   CL 116 (H) 11/05/2022 0357   CO2 17 (L) 11/07/2022 1118   CO2 17 (L) 11/06/2022 0419   CO2 23 11/05/2022 0357   GLUCOSE 81 11/07/2022 1118   GLUCOSE 88 11/06/2022 0419   GLUCOSE 78 11/05/2022 0357   BUN 12 11/07/2022 1118   BUN 14 11/06/2022 0419   BUN 19 11/05/2022 0357   CREATININE 1.73 (H) 11/07/2022 1118   CREATININE 1.66 (H) 11/06/2022 0419   CREATININE 1.86 (H) 11/05/2022 0357   CALCIUM 8.4 (L) 11/07/2022 1118   CALCIUM 7.8 (L) 11/06/2022 0419   CALCIUM 7.9 (L) 11/05/2022 0357   GFRNONAA 32 (L) 11/07/2022 1118   GFRNONAA  34 (L) 11/06/2022 0419   GFRNONAA 29 (L) 11/05/2022 0357   GFRAA 29 (L) 04/16/2018 1750   GFRAA 32 (L) 04/11/2018 0931   GFRAA 19 (L) 09/23/2017 1200   CBC    Component Value Date/Time   WBC 19.4 (H) 11/03/2022 1753   RBC 3.05 (L) 11/03/2022 1753   HGB 8.9 (L) 11/03/2022 1753   HCT 27.4 (L) 11/03/2022 1753   PLT 747 (H) 11/03/2022 1753   MCV 89.8 11/03/2022 1753   MCH 29.2 11/03/2022 1753   MCHC 32.5 11/03/2022 1753   RDW 15.5 11/03/2022 1753   LYMPHSABS 2.4 11/03/2022 1753   MONOABS 0.7 11/03/2022 1753   EOSABS 0.3 11/03/2022 1753   BASOSABS 0.1 11/03/2022 1753   HEPATIC Function Panel Recent Labs    11/03/22 1753  PROT 7.7  ALBUMIN 2.6*  AST 12*  ALT 13  ALKPHOS 77   HEMOGLOBIN A1C No results found for: "MPG" CARDIAC ENZYMES Lab Results  Component Value Date   CKTOTAL 364 (H) 11/16/2011   CKMB 8.0 (HH) 11/16/2011   TROPONINI <0.30 12/27/2012   TROPONINI <0.30 12/26/2012   TROPONINI <0.30 12/26/2012   BNP No results for input(s): "PROBNP" in the last 8760 hours. TSH No results for input(s): "TSH" in the last 8760 hours. CHOLESTEROL No results for input(s): "CHOL" in the last 8760 hours.  Scheduled Meds:  albuterol  2.5 mg Inhalation BID   amLODipine  2.5 mg Oral Daily   busPIRone  10 mg Oral BID   dicyclomine  10 mg Oral TID AC   feeding supplement  237 mL Oral BID BM   gabapentin  300 mg Oral QHS   guaiFENesin-dextromethorphan  5 mL Oral Q4H while awake   meclizine  25 mg Oral BID   metoCLOPramide  5 mg Oral TID AC   mirtazapine  7.5 mg Oral Daily   mometasone-formoterol  1 puff Inhalation Daily   multivitamin with minerals  1 tablet Oral Daily   nicotine  21 mg Transdermal Daily   pantoprazole  40 mg Oral Daily   pneumococcal 20-valent conjugate vaccine  0.5 mL Intramuscular Tomorrow-1000   potassium chloride  20 mEq Oral TID   QUEtiapine  25 mg Oral QHS   Continuous Infusions:  sodium chloride 50 mL/hr at 11/07/22 1233   cefTRIAXone  (ROCEPHIN)  IV 1 g (11/06/22 1843)   potassium chloride     PRN Meds:.ondansetron, oxyCODONE  Assessment/Plan: Community acquired pneumonia Weakness Dizziness Chronic myalgia Psoriatic arthritis Chronic anxiety and depression CKD, IV Dehydration Severe hypokalemia, improving Tobacco use disorder  Plan: Increase potassium intake/supplement Continue IV antibiotics.   LOS: 4 days   Time spent including chart review, lab review, examination, discussion with patient/Family/Nurse : 30 min   [name]  MD  11/07/2022, 1:36 PM

## 2022-11-07 NOTE — Plan of Care (Signed)
A/Ox4 and on room air. Had some chest soreness from excessive cough, but patient able to sleep off and on during the night. Took meds as ordered. NS running at 50. Fiance at bedside.    Problem: Activity: Goal: Ability to tolerate increased activity will improve Outcome: Progressing   Problem: Clinical Measurements: Goal: Ability to maintain a body temperature in the normal range will improve Outcome: Progressing   Problem: Respiratory: Goal: Ability to maintain adequate ventilation will improve Outcome: Progressing Goal: Ability to maintain a clear airway will improve Outcome: Progressing   Problem: Education: Goal: Knowledge of General Education information will improve Description: Including pain rating scale, medication(s)/side effects and non-pharmacologic comfort measures Outcome: Progressing   Problem: Health Behavior/Discharge Planning: Goal: Ability to manage health-related needs will improve Outcome: Progressing   Problem: Clinical Measurements: Goal: Ability to maintain clinical measurements within normal limits will improve Outcome: Progressing Goal: Will remain free from infection Outcome: Progressing Goal: Diagnostic test results will improve Outcome: Progressing Goal: Respiratory complications will improve Outcome: Progressing Goal: Cardiovascular complication will be avoided Outcome: Progressing   Problem: Activity: Goal: Risk for activity intolerance will decrease Outcome: Progressing   Problem: Nutrition: Goal: Adequate nutrition will be maintained Outcome: Progressing   Problem: Coping: Goal: Level of anxiety will decrease Outcome: Progressing   Problem: Elimination: Goal: Will not experience complications related to bowel motility Outcome: Progressing Goal: Will not experience complications related to urinary retention Outcome: Progressing   Problem: Pain Managment: Goal: General experience of comfort will improve Outcome: Progressing    Problem: Safety: Goal: Ability to remain free from injury will improve Outcome: Progressing   Problem: Skin Integrity: Goal: Risk for impaired skin integrity will decrease Outcome: Progressing

## 2022-11-08 LAB — BASIC METABOLIC PANEL
Anion gap: 11 (ref 5–15)
BUN: 13 mg/dL (ref 8–23)
CO2: 16 mmol/L — ABNORMAL LOW (ref 22–32)
Calcium: 8.3 mg/dL — ABNORMAL LOW (ref 8.9–10.3)
Chloride: 119 mmol/L — ABNORMAL HIGH (ref 98–111)
Creatinine, Ser: 1.58 mg/dL — ABNORMAL HIGH (ref 0.44–1.00)
GFR, Estimated: 36 mL/min — ABNORMAL LOW (ref >60)
Glucose, Bld: 68 mg/dL — ABNORMAL LOW (ref 70–99)
Potassium: 4.1 mmol/L (ref 3.5–5.1)
Sodium: 146 mmol/L — ABNORMAL HIGH (ref 135–145)

## 2022-11-08 LAB — CULTURE, BLOOD (ROUTINE X 2)
Culture: NO GROWTH
Special Requests: ADEQUATE

## 2022-11-08 MED ORDER — AZITHROMYCIN 500 MG PO TABS
500.0000 mg | ORAL_TABLET | Freq: Every day | ORAL | Status: AC
  Administered 2022-11-08: 500 mg via ORAL
  Filled 2022-11-08: qty 1

## 2022-11-08 MED ORDER — GUAIFENESIN-DM 100-10 MG/5ML PO SYRP: 5.0000 mL | ORAL_SOLUTION | ORAL | 0 refills | Status: DC

## 2022-11-08 MED ORDER — ENSURE ENLIVE PO LIQD: 237.0000 mL | Freq: Every day | ORAL | 3 refills | Status: DC

## 2022-11-08 MED ORDER — POTASSIUM CHLORIDE CRYS ER 10 MEQ PO TBCR: 10.0000 meq | EXTENDED_RELEASE_TABLET | Freq: Two times a day (BID) | ORAL | 3 refills | Status: DC

## 2022-11-08 MED ORDER — POTASSIUM CHLORIDE CRYS ER 10 MEQ PO TBCR: 10.0000 meq | EXTENDED_RELEASE_TABLET | Freq: Two times a day (BID) | ORAL | Status: DC

## 2022-11-08 MED ORDER — AZITHROMYCIN 250 MG PO TABS: 500.0000 mg | ORAL_TABLET | Freq: Every day | ORAL | 0 refills | Status: DC

## 2022-11-08 MED ORDER — AZITHROMYCIN 500 MG PO TABS: 250.0000 mg | ORAL_TABLET | Freq: Every day | ORAL | Status: DC

## 2022-11-08 MED ORDER — NICOTINE 21 MG/24HR TD PT24: 21.0000 mg | MEDICATED_PATCH | Freq: Every day | TRANSDERMAL | 0 refills | Status: DC

## 2022-11-08 MED ORDER — BUSPIRONE HCL 10 MG PO TABS: 10.0000 mg | ORAL_TABLET | Freq: Every day | ORAL | 2 refills | Status: DC

## 2022-11-08 NOTE — Care Management Important Message (Signed)
Important Message  Patient Details  Name: SCARLETTE GAMMILL MRN: [id number] Date of Birth: [birth year]   Medicare Important Message Given:  Yes  Patient left prior to IM delivery will mail a copy to the patients home address.    Darrill Vreeland 11/08/2022, 1:15 PM

## 2022-11-08 NOTE — Discharge Summary (Signed)
Physician Discharge Summary  Patient ID: Whitney Parrish MRN: [id number] DOB/AGE: 1955/09/10 67 y.o.  Admit date: 11/03/2022 Discharge date: 11/08/2022  Admission Diagnoses: Community acquired pneumonia Weakness Dizziness Chronic myalgia Psoriatic arthritis Chronic anxiety and depression CKD, II  Discharge Diagnoses:  Principal Problem:   Community acquired pneumonia Active problems: Weakness Dizziness Chronic myalgia Psoriatic arthritis Chronic anxiety and depression CKD, IV Dehydration, improving Severe hypokalemia, improved Protein calorie malnutrition Tobacco use disorder  Discharged Condition: fair  Hospital Course: 67 years old female had cough, chest pain and shortness of breath x 2 weeks. She also feels weakness. She has PMH of positional vertigo, Anxiety, headaches and arthritis. Chest x-ray was positive for right lung pneumonia. She improved with IV Rocephin x 5 days followed by Z-pak as OP. Her appetite improved with no smoking with Buspar and nicotine patch use. She was discharged home with f/u by me in 1 week.  Consults: cardiology  Significant Diagnostic Studies: labs: Elevated WBC count and low Hgb of 8.9 gm. CMET showed severe hypokalemia which normalized post oral and IV supplements. Creatinine stabilized at 1.58 mg. post hydration. Blood cultures neg x 2.  Chest x-ray: Right lung pneumonia  Echocardiogram: Hyperdynamic LV systolic function with mild MR.  Treatments: antibiotics: ceftriaxone and azithromycin. Potassium supplements. Smoking cessation and Buspar + 21 mg. Nicotine patch.  Discharge Exam: Blood pressure 134/70, pulse 75, temperature 98.4 F (36.9 C), resp. rate 18, weight 43.2 kg, SpO2 94 %. General appearance: alert, cooperative and appears stated age. Head: Normocephalic, atraumatic. Eyes: Brown eyes, pink conjunctiva, corneas clear.   Neck: No adenopathy, no carotid bruit, no JVD, supple, symmetrical, trachea midline and thyroid  not enlarged. Resp: Clearing wuth scattered rhonchi to auscultation bilaterally. Cardio: Regular rate and rhythm, S1, S2 normal, II/VI systolic murmur, no click, rub or gallop. GI: Soft, non-tender; bowel sounds normal; no organomegaly. Extremities: No edema, cyanosis or clubbing. Skin: Warm and dry.  Neurologic: Alert and oriented X 3, normal strength and tone. Normal coordination and gait.  Disposition: Discharge disposition: 01-Home or Self Care        Allergies as of 11/08/2022       Reactions   Sulfa Antibiotics Other (See Comments)   hallucinations        Medication List     TAKE these medications    albuterol 108 (90 Base) MCG/ACT inhaler Commonly known as: VENTOLIN HFA Inhale 2 puffs into the lungs 3 (three) times daily.   amLODipine 2.5 MG tablet Commonly known as: NORVASC Take 2.5 mg by mouth daily.   azithromycin 250 MG tablet Commonly known as: ZITHROMAX Take 2 tablets (500 mg total) by mouth daily. For first day then 1 daily x 4 days   busPIRone 10 MG tablet Commonly known as: BUSPAR Take 1 tablet (10 mg total) by mouth daily at 6 (six) AM.   CENTRUM SILVER 50+WOMEN PO Take 1 tablet by mouth daily.   cycloSPORINE 0.05 % ophthalmic emulsion Commonly known as: RESTASIS Place 1 drop into both eyes daily.   dicyclomine 10 MG capsule Commonly known as: BENTYL Take 10 mg by mouth in the morning and at bedtime.   Dulera 200-5 MCG/ACT Aero Generic drug: mometasone-formoterol Inhale 1 puff into the lungs daily.   feeding supplement Liqd Take 237 mLs by mouth daily at 6 (six) AM.   gabapentin 300 MG capsule Commonly known as: NEURONTIN Take 300 mg by mouth at bedtime.   guaiFENesin-dextromethorphan 100-10 MG/5ML syrup Commonly known as: ROBITUSSIN DM Take 5 mLs  by mouth every 4 (four) hours while awake.   guselkumab 100 MG/ML pen Commonly known as: TREMFYA Inject 100 mg into the skin See admin instructions. Every 8 weeks  Please resume only  after talking with your primary care MD   meclizine 25 MG tablet Commonly known as: ANTIVERT Take 25 mg by mouth 3 (three) times daily as needed for dizziness.   metoCLOPramide 5 MG tablet Commonly known as: REGLAN Take 5 mg by mouth in the morning and at bedtime.   mirtazapine 7.5 MG tablet Commonly known as: REMERON Take 7.5 mg by mouth daily.   nicotine 21 mg/24hr patch Commonly known as: NICODERM CQ - dosed in mg/24 hours Place 1 patch (21 mg total) onto the skin daily. Start taking on: November 09, 2022   nitroGLYCERIN 0.4 MG SL tablet Commonly known as: NITROSTAT Place 1 tablet (0.4 mg total) under the tongue every 5 (five) minutes x 3 doses as needed for chest pain.   omeprazole 20 MG capsule Commonly known as: PRILOSEC Take 40 mg by mouth daily.   potassium chloride 10 MEQ tablet Commonly known as: KLOR-CON M Take 1 tablet (10 mEq total) by mouth 2 (two) times daily.   QUEtiapine 25 MG tablet Commonly known as: SEROQUEL Take 25 mg by mouth at bedtime.         Time spent: Review of old chart, current chart, lab, x-ray, cardiac tests and discussion with patient over 60 minutes.  Signed: Ricki Rodriguez 11/08/2022, 9:37 AM

## 2022-11-08 NOTE — Plan of Care (Signed)
A/ox4 and on room air. No complaints of pain this shift. Took meds as ordered. New PIV placed for ongoing maintenance fluids. Fiance at bedside and active in care throughout the night.    Problem: Activity: Goal: Ability to tolerate increased activity will improve Outcome: Progressing   Problem: Clinical Measurements: Goal: Ability to maintain a body temperature in the normal range will improve Outcome: Progressing   Problem: Respiratory: Goal: Ability to maintain adequate ventilation will improve Outcome: Progressing Goal: Ability to maintain a clear airway will improve Outcome: Progressing   Problem: Education: Goal: Knowledge of General Education information will improve Description: Including pain rating scale, medication(s)/side effects and non-pharmacologic comfort measures Outcome: Progressing   Problem: Health Behavior/Discharge Planning: Goal: Ability to manage health-related needs will improve Outcome: Progressing   Problem: Clinical Measurements: Goal: Ability to maintain clinical measurements within normal limits will improve Outcome: Progressing Goal: Will remain free from infection Outcome: Progressing Goal: Diagnostic test results will improve Outcome: Progressing Goal: Respiratory complications will improve Outcome: Progressing Goal: Cardiovascular complication will be avoided Outcome: Progressing   Problem: Activity: Goal: Risk for activity intolerance will decrease Outcome: Progressing   Problem: Nutrition: Goal: Adequate nutrition will be maintained Outcome: Progressing   Problem: Coping: Goal: Level of anxiety will decrease Outcome: Progressing   Problem: Elimination: Goal: Will not experience complications related to bowel motility Outcome: Progressing Goal: Will not experience complications related to urinary retention Outcome: Progressing   Problem: Pain Managment: Goal: General experience of comfort will improve Outcome: Progressing    Problem: Safety: Goal: Ability to remain free from injury will improve Outcome: Progressing   Problem: Skin Integrity: Goal: Risk for impaired skin integrity will decrease Outcome: Progressing

## 2024-02-18 ENCOUNTER — Other Ambulatory Visit: Payer: Self-pay

## 2024-02-18 ENCOUNTER — Inpatient Hospital Stay (HOSPITAL_COMMUNITY)
Admission: EM | Admit: 2024-02-18 | Discharge: 2024-02-25 | DRG: 682 | Disposition: A | Discharge status: Home / Self Care

## 2024-02-18 ENCOUNTER — Emergency Department (HOSPITAL_COMMUNITY)

## 2024-02-18 ENCOUNTER — Encounter (HOSPITAL_COMMUNITY): Payer: Self-pay

## 2024-02-18 DIAGNOSIS — N179 Acute kidney failure, unspecified: Principal | ICD-10-CM | POA: Diagnosis present

## 2024-02-18 DIAGNOSIS — R197 Diarrhea, unspecified: Secondary | ICD-10-CM | POA: Diagnosis not present

## 2024-02-18 DIAGNOSIS — N390 Urinary tract infection, site not specified: Secondary | ICD-10-CM | POA: Diagnosis present

## 2024-02-18 DIAGNOSIS — I129 Hypertensive chronic kidney disease with stage 1 through stage 4 chronic kidney disease, or unspecified chronic kidney disease: Secondary | ICD-10-CM | POA: Diagnosis present

## 2024-02-18 DIAGNOSIS — F17200 Nicotine dependence, unspecified, uncomplicated: Secondary | ICD-10-CM | POA: Diagnosis not present

## 2024-02-18 DIAGNOSIS — E43 Unspecified severe protein-calorie malnutrition: Secondary | ICD-10-CM | POA: Diagnosis present

## 2024-02-18 DIAGNOSIS — L409 Psoriasis, unspecified: Secondary | ICD-10-CM | POA: Diagnosis present

## 2024-02-18 DIAGNOSIS — Z1152 Encounter for screening for COVID-19: Secondary | ICD-10-CM | POA: Diagnosis not present

## 2024-02-18 DIAGNOSIS — K219 Gastro-esophageal reflux disease without esophagitis: Secondary | ICD-10-CM | POA: Diagnosis present

## 2024-02-18 DIAGNOSIS — Z882 Allergy status to sulfonamides status: Secondary | ICD-10-CM

## 2024-02-18 DIAGNOSIS — Z792 Long term (current) use of antibiotics: Secondary | ICD-10-CM | POA: Diagnosis not present

## 2024-02-18 DIAGNOSIS — E870 Hyperosmolality and hypernatremia: Secondary | ICD-10-CM | POA: Diagnosis present

## 2024-02-18 DIAGNOSIS — D631 Anemia in chronic kidney disease: Secondary | ICD-10-CM | POA: Diagnosis present

## 2024-02-18 DIAGNOSIS — N189 Chronic kidney disease, unspecified: Secondary | ICD-10-CM | POA: Diagnosis not present

## 2024-02-18 DIAGNOSIS — N10 Acute tubulo-interstitial nephritis: Secondary | ICD-10-CM

## 2024-02-18 DIAGNOSIS — R531 Weakness: Secondary | ICD-10-CM

## 2024-02-18 DIAGNOSIS — R627 Adult failure to thrive: Secondary | ICD-10-CM | POA: Diagnosis present

## 2024-02-18 DIAGNOSIS — E8729 Other acidosis: Secondary | ICD-10-CM | POA: Diagnosis present

## 2024-02-18 DIAGNOSIS — R55 Syncope and collapse: Secondary | ICD-10-CM | POA: Diagnosis present

## 2024-02-18 DIAGNOSIS — K529 Noninfective gastroenteritis and colitis, unspecified: Secondary | ICD-10-CM

## 2024-02-18 DIAGNOSIS — B351 Tinea unguium: Secondary | ICD-10-CM | POA: Diagnosis present

## 2024-02-18 DIAGNOSIS — J188 Other pneumonia, unspecified organism: Secondary | ICD-10-CM | POA: Diagnosis not present

## 2024-02-18 DIAGNOSIS — Z7951 Long term (current) use of inhaled steroids: Secondary | ICD-10-CM

## 2024-02-18 DIAGNOSIS — E049 Nontoxic goiter, unspecified: Secondary | ICD-10-CM | POA: Diagnosis present

## 2024-02-18 DIAGNOSIS — Z79899 Other long term (current) drug therapy: Secondary | ICD-10-CM

## 2024-02-18 DIAGNOSIS — E040 Nontoxic diffuse goiter: Secondary | ICD-10-CM | POA: Diagnosis not present

## 2024-02-18 DIAGNOSIS — Z720 Tobacco use: Secondary | ICD-10-CM | POA: Diagnosis not present

## 2024-02-18 DIAGNOSIS — J159 Unspecified bacterial pneumonia: Secondary | ICD-10-CM | POA: Diagnosis present

## 2024-02-18 DIAGNOSIS — R918 Other nonspecific abnormal finding of lung field: Secondary | ICD-10-CM | POA: Diagnosis not present

## 2024-02-18 DIAGNOSIS — F32A Depression, unspecified: Secondary | ICD-10-CM | POA: Diagnosis present

## 2024-02-18 DIAGNOSIS — R64 Cachexia: Secondary | ICD-10-CM | POA: Diagnosis present

## 2024-02-18 DIAGNOSIS — N184 Chronic kidney disease, stage 4 (severe): Secondary | ICD-10-CM | POA: Diagnosis present

## 2024-02-18 DIAGNOSIS — Z6821 Body mass index (BMI) 21.0-21.9, adult: Secondary | ICD-10-CM

## 2024-02-18 DIAGNOSIS — J45909 Unspecified asthma, uncomplicated: Secondary | ICD-10-CM | POA: Diagnosis not present

## 2024-02-18 DIAGNOSIS — E876 Hypokalemia: Secondary | ICD-10-CM | POA: Diagnosis present

## 2024-02-18 DIAGNOSIS — J180 Bronchopneumonia, unspecified organism: Secondary | ICD-10-CM | POA: Diagnosis present

## 2024-02-18 DIAGNOSIS — E861 Hypovolemia: Secondary | ICD-10-CM | POA: Diagnosis present

## 2024-02-18 DIAGNOSIS — R0682 Tachypnea, not elsewhere classified: Secondary | ICD-10-CM | POA: Diagnosis present

## 2024-02-18 DIAGNOSIS — D649 Anemia, unspecified: Secondary | ICD-10-CM | POA: Diagnosis not present

## 2024-02-18 DIAGNOSIS — N200 Calculus of kidney: Secondary | ICD-10-CM | POA: Diagnosis present

## 2024-02-18 DIAGNOSIS — E860 Dehydration: Secondary | ICD-10-CM | POA: Diagnosis present

## 2024-02-18 DIAGNOSIS — F17210 Nicotine dependence, cigarettes, uncomplicated: Secondary | ICD-10-CM | POA: Diagnosis present

## 2024-02-18 DIAGNOSIS — R7989 Other specified abnormal findings of blood chemistry: Secondary | ICD-10-CM

## 2024-02-18 LAB — CBC
HCT: 31 % — ABNORMAL LOW (ref 36.0–46.0)
Hemoglobin: 10.6 g/dL — ABNORMAL LOW (ref 12.0–15.0)
MCH: 33.7 pg (ref 26.0–34.0)
MCHC: 34.2 g/dL (ref 30.0–36.0)
MCV: 98.4 fL (ref 80.0–100.0)
Platelets: 503 K/uL — ABNORMAL HIGH (ref 150–400)
RBC: 3.15 MIL/uL — ABNORMAL LOW (ref 3.87–5.11)
RDW: 14.9 % (ref 11.5–15.5)
WBC: 16.5 K/uL — ABNORMAL HIGH (ref 4.0–10.5)
nRBC: 0.7 % — ABNORMAL HIGH (ref 0.0–0.2)

## 2024-02-18 LAB — COMPREHENSIVE METABOLIC PANEL WITH GFR
ALT: 9 U/L (ref 0–44)
AST: 20 U/L (ref 15–41)
Albumin: 2.9 g/dL — ABNORMAL LOW (ref 3.5–5.0)
Alkaline Phosphatase: 78 U/L (ref 38–126)
Anion gap: 14 (ref 5–15)
BUN: 26 mg/dL — ABNORMAL HIGH (ref 8–23)
CO2: 11 mmol/L — ABNORMAL LOW (ref 22–32)
Calcium: 8.4 mg/dL — ABNORMAL LOW (ref 8.9–10.3)
Chloride: 114 mmol/L — ABNORMAL HIGH (ref 98–111)
Creatinine, Ser: 4 mg/dL — ABNORMAL HIGH (ref 0.44–1.00)
GFR, Estimated: 12 mL/min — ABNORMAL LOW (ref >60)
Glucose, Bld: 100 mg/dL — ABNORMAL HIGH (ref 70–99)
Potassium: 2.9 mmol/L — ABNORMAL LOW (ref 3.5–5.1)
Sodium: 139 mmol/L (ref 135–145)
Total Bilirubin: 1.1 mg/dL (ref 0.0–1.2)
Total Protein: 7 g/dL (ref 6.5–8.1)

## 2024-02-18 LAB — CBG MONITORING, ED: Glucose-Capillary: 113 mg/dL — ABNORMAL HIGH (ref 70–99)

## 2024-02-18 LAB — LIPASE, BLOOD: Lipase: 53 U/L — ABNORMAL HIGH (ref 11–51)

## 2024-02-18 LAB — RESP PANEL BY RT-PCR (RSV, FLU A&B, COVID)  RVPGX2
Influenza A by PCR: NEGATIVE
Influenza B by PCR: NEGATIVE
Resp Syncytial Virus by PCR: NEGATIVE
SARS Coronavirus 2 by RT PCR: NEGATIVE

## 2024-02-18 LAB — CK: Total CK: 138 U/L (ref 38–234)

## 2024-02-18 LAB — TROPONIN I (HIGH SENSITIVITY)
Troponin I (High Sensitivity): 15 ng/L (ref <18)
Troponin I (High Sensitivity): 15 ng/L (ref <18)

## 2024-02-18 LAB — MAGNESIUM: Magnesium: 1.2 mg/dL — ABNORMAL LOW (ref 1.7–2.4)

## 2024-02-18 LAB — BRAIN NATRIURETIC PEPTIDE: B Natriuretic Peptide: 2311.1 pg/mL — ABNORMAL HIGH (ref 0.0–100.0)

## 2024-02-18 MED ORDER — SODIUM CHLORIDE 0.9 % IV SOLN
1.0000 g | Freq: Once | INTRAVENOUS | Status: AC
  Administered 2024-02-18: 1 g via INTRAVENOUS
  Filled 2024-02-18: qty 10

## 2024-02-18 MED ORDER — NICOTINE POLACRILEX 2 MG MT GUM: 2.0000 mg | CHEWING_GUM | OROMUCOSAL | Status: DC | PRN

## 2024-02-18 MED ORDER — BUDESON-GLYCOPYRROL-FORMOTEROL 160-9-4.8 MCG/ACT IN AERO
2.0000 | INHALATION_SPRAY | Freq: Two times a day (BID) | RESPIRATORY_TRACT | Status: DC
  Administered 2024-02-18 – 2024-02-25 (×14): 2 via RESPIRATORY_TRACT
  Filled 2024-02-18 (×2): qty 5.9

## 2024-02-18 MED ORDER — MAGNESIUM SULFATE 2 GM/50ML IV SOLN
2.0000 g | Freq: Once | INTRAVENOUS | Status: AC
  Administered 2024-02-18: 2 g via INTRAVENOUS
  Filled 2024-02-18: qty 50

## 2024-02-18 MED ORDER — PANTOPRAZOLE SODIUM 40 MG PO TBEC
40.0000 mg | DELAYED_RELEASE_TABLET | Freq: Every day | ORAL | Status: DC
  Administered 2024-02-18 – 2024-02-19 (×2): 40 mg via ORAL
  Filled 2024-02-18 (×2): qty 1

## 2024-02-18 MED ORDER — SODIUM CHLORIDE 0.9 % IV SOLN
500.0000 mg | Freq: Once | INTRAVENOUS | Status: DC
  Filled 2024-02-18: qty 5

## 2024-02-18 MED ORDER — SODIUM CHLORIDE 0.9 % IV SOLN: 2.0000 g | INTRAVENOUS | Status: DC

## 2024-02-18 MED ORDER — NICOTINE 14 MG/24HR TD PT24
14.0000 mg | MEDICATED_PATCH | Freq: Every day | TRANSDERMAL | Status: DC
  Administered 2024-02-19 – 2024-02-25 (×7): 14 mg via TRANSDERMAL
  Filled 2024-02-18 (×7): qty 1

## 2024-02-18 MED ORDER — SODIUM CHLORIDE 0.9 % IV SOLN: 500.0000 mg | INTRAVENOUS | Status: DC

## 2024-02-18 MED ORDER — POTASSIUM CHLORIDE 10 MEQ/100ML IV SOLN
10.0000 meq | INTRAVENOUS | Status: AC
  Administered 2024-02-18 – 2024-02-19 (×3): 10 meq via INTRAVENOUS
  Filled 2024-02-18 (×3): qty 100

## 2024-02-18 MED ORDER — MIRTAZAPINE 15 MG PO TABS: 7.5000 mg | ORAL_TABLET | Freq: Every day | ORAL | Status: DC

## 2024-02-18 MED ORDER — QUETIAPINE FUMARATE 25 MG PO TABS: 25.0000 mg | ORAL_TABLET | Freq: Every day | ORAL | Status: DC

## 2024-02-18 MED ORDER — ONDANSETRON HCL 4 MG/2ML IJ SOLN
4.0000 mg | Freq: Once | INTRAMUSCULAR | Status: AC
  Administered 2024-02-18: 4 mg via INTRAVENOUS
  Filled 2024-02-18: qty 2

## 2024-02-18 MED ORDER — MECLIZINE HCL 25 MG PO TABS: 25.0000 mg | ORAL_TABLET | Freq: Three times a day (TID) | ORAL | Status: DC | PRN

## 2024-02-18 MED ORDER — LACTATED RINGERS IV BOLUS
1000.0000 mL | Freq: Once | INTRAVENOUS | Status: AC
Start: 2024-02-18 — End: 2024-02-19
  Administered 2024-02-18: 1000 mL via INTRAVENOUS

## 2024-02-18 MED ORDER — HEPARIN SODIUM (PORCINE) 5000 UNIT/ML IJ SOLN
5000.0000 [IU] | Freq: Three times a day (TID) | INTRAMUSCULAR | Status: DC
  Administered 2024-02-18 – 2024-02-25 (×21): 5000 [IU] via SUBCUTANEOUS
  Filled 2024-02-18 (×22): qty 1

## 2024-02-18 MED ORDER — SODIUM CHLORIDE 0.9 % IV BOLUS
1000.0000 mL | Freq: Once | INTRAVENOUS | Status: AC
  Administered 2024-02-18: 1000 mL via INTRAVENOUS

## 2024-02-18 NOTE — ED Notes (Signed)
 Patient transported to CT

## 2024-02-18 NOTE — ED Notes (Signed)
 CCMD called for cardiac monitoring.

## 2024-02-18 NOTE — Hospital Course (Addendum)
#   Acute kidney injury on CKD 4 # Syncope #Hypokalemia #Metabolic acidosis #Chronic diarrhea    #Anemia

## 2024-02-18 NOTE — ED Notes (Signed)
 2 unsuccessful attempts at blood draw for patient. ED phlebotomist asked to see patient for lab work. EDP notified of delay.

## 2024-02-18 NOTE — ED Notes (Signed)
 Boyfriend Janalyn Barter [phone] would like an update asap

## 2024-02-18 NOTE — ED Notes (Signed)
 Repeat EKG exported. [place], MD notified of repeat. Continued difficulty obtaining ordered labs. Phlebotomy asked to see patient for blood work.

## 2024-02-18 NOTE — H&P (Cosign Needed Addendum)
 Date: 02/18/2024               Patient Name:  Whitney Parrish MRN: [id number]  DOB: [birth year] Age / Sex: 68 y.o., female   PCP: Claudene Pacific, MD         Medical Service: Internal Medicine Teaching Service         Attending Physician: Dr. Mliss Pouch      First Contact: Sallyanne Primas, DO    Second Contact: Dr. Toma Edwards, DO         Pager Information: First Contact Pager: [phone]   Second Contact Pager: [phone]   SUBJECTIVE   Chief Complaint: Syncope  History of Present Illness  Whitney Parrish is a 68 y.o. female with PMHx of CKD4, hypokalemia, asthma, GERD, psoriasis, TUD, depression, and insomnia who was brought to the hospital by EMS for evaluation of syncopal episode.  When asked, the patient only mentioned syncopal episode about 4 days ago, however ED provider mentions an episode today as well which the patient thinks is due to dehydration.  The patient states that she has not been eating or drinking very well in the past few months.  For the episode 4 days ago, the patient states that she was turning in the hallway to open a door and felt her legs give out.  She states that she passed out for an unknown duration which was unwitnessed and when she awoke she was a little confused.  Before passing out, she states that she felt a warm sensation.  Mentions palpitations 1 month ago but none with recent syncopal events.  She also notes a marked weight loss over the past few months but was unable to confirm her baseline weight.  She also endorses intermittent abdominal cramps in the RLQ that last for hours but will reside when she lies down.  Abdominal cramping not related to eating.  When she urinates, she states she has a sharp midline pain but this is now resolved.  She has been consistently having loose stools for the last month.  Some of her stools have been dark, however this has not been recently and she denies NSAID use or Goody powder. Of note, the patient has also been  feeling short of breath with a nonproductive cough for the past 3 weeks.  She states that her shortness of breath is significantly worsened today.  Also endorsing fevers and chills.   Meds - patient reported follow medications: Bentyl  10mg   Gabapentin  300mg  at night Meclizine  25mg  TID  Tremfya  was about 2 months ago  Remeron  7.5mg  daily  Dulera  200 Omeprazole 20mg   Seroquel    Allergies  Allergies as of 02/18/2024 - Review Complete 02/18/2024  Allergen Reaction Noted   Sulfa antibiotics Other (See Comments) 10/06/2011   Past Medical History CKD 4 Asthma TUD Depression Insomnia Hypokalemia GERD Psoriasis  Past Surgical History Past Surgical History:  Procedure Laterality Date   BREAST CYST EXCISION  12/15/2011   Procedure: CYST EXCISION BREAST;  Surgeon: Vicenta DELENA Poli, MD;  Location: [facility];  Service: General;  Laterality: Left;  Excision of a Chronic Left Breast Sebaceous Cyst   CARDIOVASCULAR STRESS TEST  12-28-2012   Low risk nuclear study w/ small LV cavity which may be due LVH/  no ischemia or infarct/  normal wall motion, ef 67%   CYSTOSCOPY WITH STENT PLACEMENT Left 09/08/2015   Procedure: CYSTOSCOPY WITH LEFT STENT PLACEMENT;  Surgeon: Norleen Seltzer, MD;  Location: [facility];  Service: Urology;  Laterality:  Left;   CYSTOSCOPY WITH URETEROSCOPY AND STENT PLACEMENT Left 09/25/2015   Procedure: LEFT  URETEROSCOPY STONE EXTRACTION;  Surgeon: Norleen Seltzer, MD;  Location: [facility];  Service: Urology;  Laterality: Left;   EXCISION AND REVISION SCAR ABDOMINAL WALL  01-22-2002   EXPLORATORY LAPAROTOMY  1986   w/ Abdominal Myomectomy  (per pt bowel muscle cut (injury))   HOLMIUM LASER APPLICATION Left 09/25/2015   Procedure: HOLMIUM LASER APPLICATION;  Surgeon: Norleen Seltzer, MD;  Location: [facility];  Service: Urology;  Laterality: Left;   I & D LEFT BUTTOCK ABSCESS   07-01-2010   x2 area's   NEGATIVE SLEEP STUDY  08-10-2005   SHOULDER ARTHROSCOPY  WITH SUBACROMIAL DECOMPRESSION, ROTATOR CUFF REPAIR AND BICEP TENDON REPAIR Right 11/12/2021   Procedure: SHOULDER ARTHROSCOPY WITH SUBACROMIAL DECOMPRESSION, ROTATOR CUFF REPAIR AND BICEP TENDON REPAIR;  Surgeon: Cristy Bonner DASEN, MD;  Location: [facility];  Service: Orthopedics;  Laterality: Right;   TRANSTHORACIC ECHOCARDIOGRAM  12-28-2012   grade 1 diastolic dysfunction, mild concentric LVH, ef 65-70%/  mild MR/  trivial PR and TR     Social History  Living Situation: Lives in a house in town alone. Occupation: Retired. Support: Has a friend that will check in on her and help her take her medications, otherwise she is independent. Level of Function: Independent, able to complete all ADLs and IADLs.  Uses cane for ambulation assistance. PCP: Claudene Pacific, MD  Substances: -Tobacco: 1 pack/week since she was a teenager -Alcohol: Denies -Recreational Drug: Used to smoke marijuana when she was in high school.  Has not used recreational drugs past.  Family History  Family History  Problem Relation Age of Onset   Cancer Mother        Stomach   Cancer Father        Unknown     Review of Systems  A complete ROS was negative except as per HPI.   OBJECTIVE:   Physical Exam: Blood pressure (!) 104/57, pulse 94, temperature (!) 97.1 F (36.2 C), temperature source Oral, resp. rate 16, height 5' 2 (1.575 m), weight 54.4 kg, SpO2 99%.  Constitutional: Chronically ill-appearing, cachectic, in no acute distress HENT: normocephalic atraumatic, mucous membranes moist Eyes: conjunctiva non-erythematous, PERRL, no scleral icterus Cardiovascular: regular rate and rhythm, no m/r/g Pulmonary/Chest: normal work of breathing on room air, decreased breath sounds diffusely  Abdominal: soft, mildly tender to palpation in the right lower quadrant, non-distended, bowel sounds normal MSK: normal bulk and tone Neurological: alert & oriented x3, able to pull herself up in bed using  rails Skin: warm and dry, hyperpigmented plaques on bilateral feet. Severe toenail thickening bilaterally. See picture below. Extremities: no edema or cyanosis; peripheral pulses intact Psych: normal mood and affect, thought content normal       Labs: CBC    Component Value Date/Time   WBC 16.5 (H) 02/18/2024 1518   RBC 3.15 (L) 02/18/2024 1518   HGB 10.6 (L) 02/18/2024 1518   HCT 31.0 (L) 02/18/2024 1518   PLT 503 (H) 02/18/2024 1518   MCV 98.4 02/18/2024 1518   MCH 33.7 02/18/2024 1518   MCHC 34.2 02/18/2024 1518   RDW 14.9 02/18/2024 1518   LYMPHSABS 2.4 11/03/2022 1753   MONOABS 0.7 11/03/2022 1753   EOSABS 0.3 11/03/2022 1753   BASOSABS 0.1 11/03/2022 1753     CMP     Component Value Date/Time   NA 139 02/18/2024 1518   K 2.9 (L) 02/18/2024 1518  CL 114 (H) 02/18/2024 1518   CO2 11 (L) 02/18/2024 1518   GLUCOSE 100 (H) 02/18/2024 1518   BUN 26 (H) 02/18/2024 1518   CREATININE 4.00 (H) 02/18/2024 1518   CALCIUM 8.4 (L) 02/18/2024 1518   PROT 7.0 02/18/2024 1518   ALBUMIN 2.9 (L) 02/18/2024 1518   AST 20 02/18/2024 1518   ALT 9 02/18/2024 1518   ALKPHOS 78 02/18/2024 1518   BILITOT 1.1 02/18/2024 1518   GFRNONAA 12 (L) 02/18/2024 1518   GFRAA 29 (L) 04/16/2018 1750    Imaging: CT CHEST ABDOMEN PELVIS WO CONTRAST Result Date: 02/18/2024 EXAM: CT CHEST, ABDOMEN AND PELVIS WITHOUT CONTRAST 02/18/2024 07:12:41 PM TECHNIQUE: CT of the chest, abdomen and pelvis was performed without the administration of intravenous contrast. Multiplanar reformatted images are provided for review. Automated exposure control, iterative reconstruction, and/or weight based adjustment of the mA/kV was utilized to reduce the radiation dose to as low as reasonably achievable. COMPARISON: None available. CLINICAL HISTORY: emesis, cp, abd pain. FINDINGS: CHEST: MEDIASTINUM AND LYMPH NODES: Heart and pericardium are unremarkable. Coronary artery calcifications. The central airways are clear.  No gross hilar adenopathy with limited evaluation on this noncontrast study. No mediastinal or axillary lymphadenopathy. LUNGS AND PLEURA: Right lower and upper lobe patchy peribronchovascular nodular-like consolidations with question central cavitations along a couple of the nodules of the right upper lobe . Associated centrilobular nodules along the right lower lobe with associated mucus plugging and mild bronchial wall thickening. No pleural effusion or pneumothorax. ABDOMEN AND PELVIS: LIVER: The liver is unremarkable. GALLBLADDER AND BILE DUCTS: Gallbladder is unremarkable. No biliary ductal dilatation. SPLEEN: No acute abnormality. PANCREAS: No acute abnormality. ADRENAL GLANDS: No acute abnormality. KIDNEYS, URETERS AND BLADDER: Left nephrolithiasis measuring up to 1.2 cm. Right nephrolithiasis measuring up to 0.2cm. No ureterolithiasis bilaterally. No hydroureteronephrosis bilaterally. No perinephric or periureteral stranding. Urinary bladder is unremarkable. GI AND BOWEL: Stomach demonstrates no acute abnormality. Status post appendectomy. Grossly unremarkable colon that is underdistended. No small bowel dilatation. No definite small bowel wall thickening with limited evaluation on this noncontrast study. There is no bowel obstruction. REPRODUCTIVE ORGANS: Status post hysterectomy. No adnexal masses. PERITONEUM AND RETROPERITONEUM: No ascites. No free air. VASCULATURE: Aorta is normal in caliber. Moderate atherosclerotic plaque. ABDOMINAL AND PELVIS LYMPH NODES: No lymphadenopathy. BONES AND SOFT TISSUES: No acute osseous abnormality. No focal soft tissue abnormality. IMPRESSION: 1. Bronchopneumonia of the right upper and lower lobes with possible developing cavitary lesion in the right upper lobe. No gross hilar adenopathy with limited evaluation on this noncontrast study. Recommend follow-up CT in 3 months to evaluate for complete resolution. 2. Nonobstructive bilateral nephrolithiasis measuring up to 12  mm on the left and 2 mm on the right. 3. Atherosclerotic plaque including coronary artery calcification. 4. Status post hysterectomy and appendectomy. Electronically signed by: Morgane Naveau MD 02/18/2024 07:27 PM EDT RP Workstation: [id number]   CT Cervical Spine Wo Contrast Result Date: 02/18/2024 EXAM: CT CERVICAL SPINE WITHOUT CONTRAST 02/18/2024 07:12:41 PM TECHNIQUE: CT of the cervical spine was performed without the administration of intravenous contrast. Multiplanar reformatted images are provided for review. Automated exposure control, iterative reconstruction, and/or weight based adjustment of the mA/kV was utilized to reduce the radiation dose to as low as reasonably achievable. COMPARISON: None available. CLINICAL HISTORY: Neck trauma, midline tenderness (Age 35-64y). FINDINGS: CERVICAL SPINE: BONES AND ALIGNMENT: No acute fracture or traumatic malalignment. DEGENERATIVE CHANGES: Multilevel moderate degenerative changes of the spine most prominent at the C5-C6 levels. No associated severe osseous  neural foraminal or central canal stenosis. SOFT TISSUES: No prevertebral soft tissue swelling. Heterogeneous enlarged left thyroid  gland with underlying nodules that are difficult to measure but likely measuring up to 1.6 cm. LUNGS: Trace centrilobular and paraseptal and sinus changes. Biapical pleural and pulmonary scarring. IMPRESSION: 1. No acute abnormality of the cervical spine related to the reported neck trauma. 2. Heterogeneous enlarged left thyroid  gland with nodules measuring up to approximately 1.6 cm. Recommend outpatient thyroid  gland ultrasound for further evaluation. 3. Emphysema. Electronically signed by: Morgane Naveau MD 02/18/2024 07:19 PM EDT RP Workstation: [id number]   CT Head Wo Contrast Result Date: 02/18/2024 EXAM: CT HEAD WITHOUT CONTRAST 02/18/2024 07:12:41 PM TECHNIQUE: CT of the head was performed without the administration of intravenous contrast. Automated exposure  control, iterative reconstruction, and/or weight based adjustment of the mA/kV was utilized to reduce the radiation dose to as low as reasonably achievable. COMPARISON: None available. CLINICAL HISTORY: Head trauma, moderate-severe; fall, syncope. FINDINGS: BRAIN AND VENTRICLES: Patchy and confluent areas of decreased attenuation are noted throughout the deep and periventricular white matter of the cerebral hemispheres bilaterally suggestive of chronic microvascular ischemic changes. No acute hemorrhage. No evidence of acute infarct. No hydrocephalus. No extra-axial collection. No mass effect or midline shift. ORBITS: Bilateral lens replacement. No acute abnormality. SINUSES: Right maxillary sinus mucosal thickening. SOFT TISSUES AND SKULL: No acute soft tissue abnormality. No skull fracture. IMPRESSION: 1. No acute intracranial abnormality. Electronically signed by: Morgane Naveau MD 02/18/2024 07:16 PM EDT RP Workstation: [id number]   DG Tibia/Fibula Right Result Date: 02/18/2024 CLINICAL DATA:  Clemens, pain EXAM: RIGHT TIBIA AND FIBULA - 2 VIEW COMPARISON:  11/20/2019 FINDINGS: Frontal and lateral views of the right tibia and fibula are obtained. There are no acute displaced fractures. Alignment of the right knee and ankle is anatomic. Soft tissues are normal. IMPRESSION: 1. No acute displaced fracture. Electronically Signed   By: Ozell Daring M.D.   On: 02/18/2024 16:18   DG Pelvis 1-2 Views Result Date: 02/18/2024 CLINICAL DATA:  Clemens, weakness EXAM: PELVIS - 1-2 VIEW COMPARISON:  02/01/2018 FINDINGS: Supine frontal view of the pelvis includes both hips. No acute displaced fracture, subluxation, or dislocation. Joint spaces are well preserved. Sacroiliac joints are unremarkable. IMPRESSION: 1. No acute displaced fracture. Electronically Signed   By: Ozell Daring M.D.   On: 02/18/2024 16:17   DG Chest Portable 1 View Result Date: 02/18/2024 CLINICAL DATA:  Dizziness, fell, decreased appetite EXAM:  PORTABLE CHEST 1 VIEW COMPARISON:  11/03/2022 FINDINGS: Single frontal view of the chest demonstrates an unremarkable cardiac silhouette. Scattered nodular opacities are seen within the right mid and lower lung zones, which could be inflammatory or infectious. Close follow-up is recommended to assess for resolution and exclude underlying pulmonary nodules. No effusion or pneumothorax. No acute bony abnormalities. IMPRESSION: 1. Patchy nodular consolidation within the right mid and lower lung zones, which could be inflammatory or infectious. Followup PA and lateral chest X-ray is recommended in 3-4 weeks following trial of antibiotic therapy to ensure resolution and exclude underlying parenchymal lung nodules. Electronically Signed   By: Ozell Daring M.D.   On: 02/18/2024 16:15    EKG: personally reviewed my interpretation is NSR with prolonged Qtc.   ASSESSMENT & PLAN:   Assessment & Plan by Problem: Principal Problem:   Bronchopneumonia   VIRDIA ZIESMER is a female living with a history of CKD4, hypokalemia, asthma, GERD, psoriasis, TUD, depression, and insomnia who presented with syncope and was admitted for syncopal workup,  AKI, and multifocal pneumonia on hospital day 0.  #Multifocal PNA Patient presenting with shortness of breath, fever, nonproductive cough.  Exam showing decreased breath sounds.  She is tachypneic, but afebrile with normal heart rate and blood pressure. Initial oxygen saturations wnl. Leukocytosis.  Influenza/COVID-negative.  CT showing pneumonia in the right upper and lower lobes with possible cavitary lesion in right upper lobe without hilar adenopathy.  Patient was previously on IL-23 inhibitor for psoriasis which does increase infection risk slightly, however does not severely increase risk for mycobacterial infections.  Etiology of pneumonia most likely bacterial given unilateral with cavitary lesion.  ED initiated empiric antibiotics with ceftriaxone  and azithromycin  as  well as IV fluid bolus.  Due to patient's prolonged QTc will hold azithromycin  at this time. Will consider adding doxycycline  if QTc consistently prolonged. - Continue IV ceftriaxone  - Hold azithromycin  - RVP pending - Procalcitonin pending - HIV antibody pending  #Syncope Patient with poor p.o. intake, weakness, and reported syncopal episodes in the past week.  Reported history of palpitations 1 month ago, but none with recent syncopal events.  She did endorse warm sensation before she gave out.  Vitals positive for tachypnea.  Given patient's reported history, likely due to vasovagal response versus orthostasis.  CK normal indicating that patient was likely not immobilized for prolonged period of time.  Less likely seizure/stroke as there are no focal neurologic deficits present and history not consistent with postictal phase.  We will keep the patient on cardiac monitoring given electrolyte deficiencies. - Cardiac monitoring - TSH pending - Orthostatic vitals pending  #Prolonged QTc on EKG Initial EKG with sinus rhythm and prolonged QTc to 670.  Will hold QT prolonging medications and keep patient on cardiac monitoring while waiting on magnesium  level. - Hold azithromycin  - Hold home quetiapine  - Repeat EKG pending - Cardiac monitor - Magnesium  pending  #AKI #CKD4 Cr of 4.0 with baseline of ~1.6-1.8.  AKI likely in the setting of poor p.o. intake and poor perfusion.  Elevated BNP is likely in the setting of acute kidney injury as patient is not volume overloaded.  CT imaging with nonobstructive bilateral nephrolithiasis up to 12 mm on the left.  Received fluid bolus in the ED. Heparin  for DVT ppx due to renal dysfunction.  #Hypokalemia Per last admission note, the patient has chronic hypokalemia.  On this admission potassium down to 2.9.  Will recheck BMP after potassium supplementation by ED. - BMP pending  #Nephrolithiasis Patient endorsing sharp, midline abdominal pain with  urination recently which is now resolved.  CT imaging with evidence of nonobstructive nephrolithiasis bilaterally.  Will check urinalysis and continue to monitor patient symptoms. - Urinalysis pending  #Failure to thrive Patient is visibly cachectic and noting poor p.o. intake.  Patient reporting subjective weight loss but per chart review her weight has been consistent for the past few months. she is prescribed Ensure supplements at home.  Albumin low at 2.9.  Will consult nutrition for further recommendations. - Follow-up nutrition recs - Ensure protein supplements twice daily - Continue home mirtazapine   #NAGMA #Diarrhea Patient with hyperchloremic non-anion gap metabolic acidosis.  Occasional dark stools noted but none recently, low concern for acute GI bleed. Exam showing mild TTP of RLQ. CT imaging without findings of bowel obstruction or obvious etiology of symptoms. ED ordered C diff PCR which is pending.  NAGMA may be caused by diarrhea, however could also be due to renal tubular acidosis.  Urinalysis for urine pH or urine anion gap may  be helpful in further elucidating this.   - C. difficile PCR pending - GI panel pending - Urinalysis pending - Urine Cr, Na, K, Cl pending  #Psoriasis Patient with active psoriasis lesions on bilateral lower extremities which has reportedly been worsening.  Previously on biologic therapy (Guselkumab ).  Should follow-up with her dermatologist for further evaluation in the outpatient setting. Screening for HIV which is associated with exacerbation of psoriasis. - HIV antibody test pending - follow up outpatient  #Asthma Per chart review, the patient has history of asthma.  Home medication is Dulera  which is not on formulary so we will use Breztri while inpatient.  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  - Start Breztri daily  #Enlarged thyroid  gland on  imaging Incidental imaging finding of heterogenously enlarged left thyroid  gland with nodules measuring up to 1.6 cm.  Per radiology, patient should receive outpatient thyroid  gland ultrasound for further evaluation.  We are checking TSH as well. - TSH pending - Outpatient ultrasound follow-up  #TUD Patient notes she would like to quit.  She would likely benefit from access to a primary care physician. - Nicotine  patch daily - Nicotine  gum as needed  #GERD - Start PO pantoprazole  40 mg daily  #Depression - Hold home Seroquel  due to prolonged QTc - Continue home mirtazapine   Best practice: Diet: Normal VTE: Heparin  IVF: NS,Bolus Code: Full  Disposition planning: Prior to Admission Living Arrangement: Home Anticipated Discharge Location: Home  Dispo: Admit patient to Inpatient with expected length of stay greater than 2 midnights.  Signed: Letha Cheadle, MD [facility]  PGY-1 02/18/2024, 9:58 PM  Please contact IM Residency On-Call Pager at: [phone] or [phone].

## 2024-02-18 NOTE — ED Triage Notes (Signed)
 Pt bib [facility] coming from home after neighbors called out EMS for pt. Pt reports abdominal pain for a week, dizziness for the past 4 days, and decreased appetite for weeks. Pt reports n/v/d as well. GCS 15.   EMS VS: 116/66 70 HR 164 CBG 97% RA

## 2024-02-18 NOTE — ED Provider Notes (Signed)
 [place] EMERGENCY DEPARTMENT AT [facility] Provider Note   CSN: [id number] Arrival date & time: 02/18/24  1507     Patient presents with: Weakness   Whitney Parrish is a 68 y.o. female here for evaluation of weakness and syncope.  Patient states she has had overall decreased appetite over the last 2 to 3 weeks.  She feels like she has lost weight however is unable to weigh herself.  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  She denies any blood in her stool.  No recent antibiotics or travel.  She has had some diffuse chest pain, shortness of breath, cough and nausea over the last week.  She denies any sick contacts.  No fever.  Today she was walking when she had a syncopal episode.  She denies any prodrome of symptoms.  She feels like she is dehydrated.  She states she was walking next and she knew she was on the ground.  She denies any tongue injury, bowel or bladder incontinence, seizure-like activity.  She states she had some people that typically and come and check on her daily they had to help her up off the ground.  She has some pain to her right lower extremity.  Family member comes out daily to check on her mostly in the evenings.  They are concerned as she has had progressive increased gen weakness.   HPI     Prior to Admission medications   Medication Sig Start Date End Date Taking? Authorizing Provider  albuterol  (PROVENTIL  HFA;VENTOLIN  HFA) 108 (90 Base) MCG/ACT inhaler Inhale 2 puffs into the lungs 3 (three) times daily.    [provider]  amLODipine  (NORVASC ) 2.5 MG tablet Take 2.5 mg by mouth daily.    [provider]  azithromycin  (ZITHROMAX ) 250 MG tablet Take 2 tablets (500 mg total) by mouth daily. For first day then 1 daily x 4 days 11/08/22   Claudene Pacific, MD  busPIRone  (BUSPAR ) 10 MG tablet Take 1 tablet (10 mg total) by mouth daily at 6 (six) AM. 11/08/22   Claudene Pacific, MD  cycloSPORINE  (RESTASIS ) 0.05 % ophthalmic emulsion Place 1 drop into both eyes daily. Patient not taking: Reported on 11/03/2022    [provider]  dicyclomine  (BENTYL ) 10 MG capsule Take 10 mg by mouth in the morning and at bedtime.    [provider]  feeding supplement (ENSURE ENLIVE / ENSURE PLUS) LIQD Take 237 mLs by mouth daily at 6 (six) AM. 11/08/22   Claudene Pacific, MD  gabapentin  (NEURONTIN ) 300 MG capsule Take 300 mg by mouth at bedtime. 06/26/19   [provider]  guaiFENesin -dextromethorphan  (ROBITUSSIN DM) 100-10 MG/5ML syrup Take 5 mLs by mouth every 4 (four) hours while awake. 11/08/22   Claudene Pacific, MD  Guselkumab  100 MG/ML SOPN Inject 100 mg into the skin See admin instructions. Every 8 weeks  Please resume only after talking with your primary care MD 07/12/20   Raenelle Donalda HERO, MD  meclizine  (ANTIVERT ) 25 MG tablet Take 25 mg by mouth 3 (three) times daily as needed for dizziness.    [provider]  metoCLOPramide  (REGLAN ) 5 MG tablet Take 5 mg by mouth in the morning and at bedtime.    [provider]  mirtazapine  (REMERON ) 7.5 MG tablet Take 7.5 mg by mouth daily.    [provider]  mometasone -formoterol  (DULERA ) 200-5 MCG/ACT AERO Inhale 1 puff into  the lungs daily.    [provider]  Multiple Vitamins-Minerals (CENTRUM SILVER 50+WOMEN PO) Take 1 tablet by mouth daily.    [provider]  nicotine  (NICODERM CQ  - DOSED IN MG/24 HOURS) 21 mg/24hr patch Place 1 patch (21 mg total) onto the skin daily. 11/09/22   Claudene Pacific, MD  nitroGLYCERIN  (NITROSTAT ) 0.4 MG SL tablet Place 1 tablet (0.4 mg total) under the tongue every 5 (five) minutes x 3 doses as needed for chest pain. 12/29/12   Claudene Pacific, MD  omeprazole (PRILOSEC) 20 MG capsule Take 40 mg by mouth daily. 06/07/20   [provider]  potassium chloride  (KLOR-CON  M) 10 MEQ tablet Take 1 tablet (10 mEq total) by mouth 2 (two) times  daily. 11/08/22   Claudene Pacific, MD  QUEtiapine  (SEROQUEL ) 25 MG tablet Take 25 mg by mouth at bedtime.    [provider]    Allergies: Sulfa antibiotics    Review of Systems  Constitutional:  Positive for activity change, appetite change, fatigue and unexpected weight change.  HENT: Negative.    Respiratory:  Positive for cough and shortness of breath.   Cardiovascular:  Positive for chest pain. Negative for palpitations and leg swelling.  Gastrointestinal:  Positive for abdominal pain, diarrhea and nausea. Negative for abdominal distention, anal bleeding, blood in stool, constipation, rectal pain and vomiting.  Genitourinary: Negative.   Musculoskeletal:  Positive for myalgias.  Skin: Negative.   Neurological:  Positive for weakness, light-headedness and headaches. Negative for dizziness, tremors, seizures, syncope, facial asymmetry, speech difficulty and numbness.  All other systems reviewed and are negative.   Updated Vital Signs BP (!) 104/57   Pulse 94   Temp (!) 97.1 F (36.2 C) (Oral)   Resp 16   Ht 5' 2 (1.575 m)   Wt 54.4 kg   SpO2 99%   BMI 21.95 kg/m   Physical Exam Vitals and nursing note reviewed.  Constitutional:      General: She is not in acute distress.    Appearance: She is well-developed. She is not ill-appearing or toxic-appearing.     Comments: Thin, cachectic, chronically ill-appearing  HENT:     Head: Normocephalic and atraumatic.     Nose: Nose normal.     Mouth/Throat:     Mouth: Mucous membranes are dry.  Eyes:     Pupils: Pupils are equal, round, and reactive to light.  Cardiovascular:     Rate and Rhythm: Normal rate.     Pulses: Normal pulses.          Radial pulses are 2+ on the right side and 2+ on the left side.       Dorsalis pedis pulses are 2+ on the right side and 2+ on the left side.     Heart sounds: Normal heart sounds.  Pulmonary:     Effort: Pulmonary effort is normal. No respiratory distress.     Breath sounds:  Normal breath sounds.  Chest:     Comments: Diffuse tenderness chest wall.  No crepitus or step-off Abdominal:     General: Bowel sounds are normal. There is no distension.     Palpations: Abdomen is soft.     Tenderness: There is abdominal tenderness. There is no right CVA tenderness, left CVA tenderness, guarding or rebound.  Musculoskeletal:        General: Normal range of motion.     Cervical back: Normal range of motion.     Comments: No midline C/T/L tenderness.  Nontender about upper extremities.  Nontender left lower extremity.  Tenderness midshaft, proximal tib-fib right lower extremity.  Nontender right hip, femur, foot.  Skin:    General: Skin is warm and dry.     Capillary Refill: Capillary refill takes less than 2 seconds.     Comments: No obvious contusions, abrasions  Neurological:     General: No focal deficit present.     Mental Status: She is alert.     Cranial Nerves: Cranial nerves 2-12 are intact.     Sensory: Sensation is intact.     Motor: Motor function is intact.     Coordination: Coordination is intact.  Psychiatric:        Mood and Affect: Mood normal.      (all labs ordered are listed, but only abnormal results are displayed) Labs Reviewed  COMPREHENSIVE METABOLIC PANEL WITH GFR - Abnormal; Notable for the following components:      Result Value   Potassium 2.9 (*)    Chloride 114 (*)    CO2 11 (*)    Glucose, Bld 100 (*)    BUN 26 (*)    Creatinine, Ser 4.00 (*)    Calcium 8.4 (*)    Albumin 2.9 (*)    GFR, Estimated 12 (*)    All other components within normal limits  CBC - Abnormal; Notable for the following components:   WBC 16.5 (*)    RBC 3.15 (*)    Hemoglobin 10.6 (*)    HCT 31.0 (*)    Platelets 503 (*)    nRBC 0.7 (*)    All other components within normal limits  LIPASE, BLOOD - Abnormal; Notable for the following components:   Lipase 53 (*)    All other components within normal limits  MAGNESIUM  - Abnormal; Notable for the  following components:   Magnesium  1.2 (*)    All other components within normal limits  BRAIN NATRIURETIC PEPTIDE - Abnormal; Notable for the following components:   B Natriuretic Peptide 2,311.1 (*)    All other components within normal limits  CBG MONITORING, ED - Abnormal; Notable for the following components:   Glucose-Capillary 113 (*)    All other components within normal limits  RESP PANEL BY RT-PCR (RSV, FLU A&B, COVID)  [id number]  GASTROINTESTINAL PANEL BY PCR, STOOL (REPLACES STOOL CULTURE)  C DIFFICILE QUICK SCREEN W PCR REFLEX    CK  URINALYSIS, ROUTINE W REFLEX MICROSCOPIC  TROPONIN I (HIGH SENSITIVITY)  TROPONIN I (HIGH SENSITIVITY)    EKG: EKG Interpretation Date/Time:  Saturday February 18 2024 15:16:09 EDT Ventricular Rate:  74 PR Interval:  137 QRS Duration:  85 QT Interval:  603 QTC Calculation: 670 R Axis:   48  Text Interpretation: Sinus rhythm Confirmed by Ruthe Cornet [phone]) on 02/18/2024 4:27:30 PM  Radiology: CT CHEST ABDOMEN PELVIS WO CONTRAST Result Date: 02/18/2024 EXAM: CT CHEST, ABDOMEN AND PELVIS WITHOUT CONTRAST 02/18/2024 07:12:41 PM TECHNIQUE: CT of the chest, abdomen and pelvis was performed without the administration of intravenous contrast. Multiplanar reformatted images are provided for review. Automated exposure control, iterative reconstruction, and/or weight based adjustment of the mA/kV was utilized to reduce the radiation dose to as low as reasonably achievable. COMPARISON: None available. CLINICAL HISTORY: emesis, cp, abd pain. FINDINGS: CHEST: MEDIASTINUM AND LYMPH NODES: Heart and pericardium are unremarkable. Coronary artery calcifications. The central airways are clear. No gross hilar adenopathy with limited evaluation on this noncontrast study. No mediastinal or axillary lymphadenopathy. LUNGS AND PLEURA: Right  lower and upper lobe patchy peribronchovascular nodular-like consolidations with question central cavitations along a couple of  the nodules of the right upper lobe . Associated centrilobular nodules along the right lower lobe with associated mucus plugging and mild bronchial wall thickening. No pleural effusion or pneumothorax. ABDOMEN AND PELVIS: LIVER: The liver is unremarkable. GALLBLADDER AND BILE DUCTS: Gallbladder is unremarkable. No biliary ductal dilatation. SPLEEN: No acute abnormality. PANCREAS: No acute abnormality. ADRENAL GLANDS: No acute abnormality. KIDNEYS, URETERS AND BLADDER: Left nephrolithiasis measuring up to 1.2 cm. Right nephrolithiasis measuring up to 0.2cm. No ureterolithiasis bilaterally. No hydroureteronephrosis bilaterally. No perinephric or periureteral stranding. Urinary bladder is unremarkable. GI AND BOWEL: Stomach demonstrates no acute abnormality. Status post appendectomy. Grossly unremarkable colon that is underdistended. No small bowel dilatation. No definite small bowel wall thickening with limited evaluation on this noncontrast study. There is no bowel obstruction. REPRODUCTIVE ORGANS: Status post hysterectomy. No adnexal masses. PERITONEUM AND RETROPERITONEUM: No ascites. No free air. VASCULATURE: Aorta is normal in caliber. Moderate atherosclerotic plaque. ABDOMINAL AND PELVIS LYMPH NODES: No lymphadenopathy. BONES AND SOFT TISSUES: No acute osseous abnormality. No focal soft tissue abnormality. IMPRESSION: 1. Bronchopneumonia of the right upper and lower lobes with possible developing cavitary lesion in the right upper lobe. No gross hilar adenopathy with limited evaluation on this noncontrast study. Recommend follow-up CT in 3 months to evaluate for complete resolution. 2. Nonobstructive bilateral nephrolithiasis measuring up to 12 mm on the left and 2 mm on the right. 3. Atherosclerotic plaque including coronary artery calcification. 4. Status post hysterectomy and appendectomy. Electronically signed by: Morgane Naveau MD 02/18/2024 07:27 PM EDT RP Workstation: [id number]   CT Cervical Spine Wo  Contrast Result Date: 02/18/2024 EXAM: CT CERVICAL SPINE WITHOUT CONTRAST 02/18/2024 07:12:41 PM TECHNIQUE: CT of the cervical spine was performed without the administration of intravenous contrast. Multiplanar reformatted images are provided for review. Automated exposure control, iterative reconstruction, and/or weight based adjustment of the mA/kV was utilized to reduce the radiation dose to as low as reasonably achievable. COMPARISON: None available. CLINICAL HISTORY: Neck trauma, midline tenderness (Age 67-64y). FINDINGS: CERVICAL SPINE: BONES AND ALIGNMENT: No acute fracture or traumatic malalignment. DEGENERATIVE CHANGES: Multilevel moderate degenerative changes of the spine most prominent at the C5-C6 levels. No associated severe osseous neural foraminal or central canal stenosis. SOFT TISSUES: No prevertebral soft tissue swelling. Heterogeneous enlarged left thyroid  gland with underlying nodules that are difficult to measure but likely measuring up to 1.6 cm. LUNGS: Trace centrilobular and paraseptal and sinus changes. Biapical pleural and pulmonary scarring. IMPRESSION: 1. No acute abnormality of the cervical spine related to the reported neck trauma. 2. Heterogeneous enlarged left thyroid  gland with nodules measuring up to approximately 1.6 cm. Recommend outpatient thyroid  gland ultrasound for further evaluation. 3. Emphysema. Electronically signed by: Morgane Naveau MD 02/18/2024 07:19 PM EDT RP Workstation: [id number]   CT Head Wo Contrast Result Date: 02/18/2024 EXAM: CT HEAD WITHOUT CONTRAST 02/18/2024 07:12:41 PM TECHNIQUE: CT of the head was performed without the administration of intravenous contrast. Automated exposure control, iterative reconstruction, and/or weight based adjustment of the mA/kV was utilized to reduce the radiation dose to as low as reasonably achievable. COMPARISON: None available. CLINICAL HISTORY: Head trauma, moderate-severe; fall, syncope. FINDINGS: BRAIN AND  VENTRICLES: Patchy and confluent areas of decreased attenuation are noted throughout the deep and periventricular white matter of the cerebral hemispheres bilaterally suggestive of chronic microvascular ischemic changes. No acute hemorrhage. No evidence of acute infarct. No hydrocephalus. No extra-axial collection. No mass effect  or midline shift. ORBITS: Bilateral lens replacement. No acute abnormality. SINUSES: Right maxillary sinus mucosal thickening. SOFT TISSUES AND SKULL: No acute soft tissue abnormality. No skull fracture. IMPRESSION: 1. No acute intracranial abnormality. Electronically signed by: Morgane Naveau MD 02/18/2024 07:16 PM EDT RP Workstation: [id number]   DG Tibia/Fibula Right Result Date: 02/18/2024 CLINICAL DATA:  Clemens, pain EXAM: RIGHT TIBIA AND FIBULA - 2 VIEW COMPARISON:  11/20/2019 FINDINGS: Frontal and lateral views of the right tibia and fibula are obtained. There are no acute displaced fractures. Alignment of the right knee and ankle is anatomic. Soft tissues are normal. IMPRESSION: 1. No acute displaced fracture. Electronically Signed   By: Ozell Daring M.D.   On: 02/18/2024 16:18   DG Pelvis 1-2 Views Result Date: 02/18/2024 CLINICAL DATA:  Clemens, weakness EXAM: PELVIS - 1-2 VIEW COMPARISON:  02/01/2018 FINDINGS: Supine frontal view of the pelvis includes both hips. No acute displaced fracture, subluxation, or dislocation. Joint spaces are well preserved. Sacroiliac joints are unremarkable. IMPRESSION: 1. No acute displaced fracture. Electronically Signed   By: Ozell Daring M.D.   On: 02/18/2024 16:17   DG Chest Portable 1 View Result Date: 02/18/2024 CLINICAL DATA:  Dizziness, fell, decreased appetite EXAM: PORTABLE CHEST 1 VIEW COMPARISON:  11/03/2022 FINDINGS: Single frontal view of the chest demonstrates an unremarkable cardiac silhouette. Scattered nodular opacities are seen within the right mid and lower lung zones, which could be inflammatory or infectious. Close  follow-up is recommended to assess for resolution and exclude underlying pulmonary nodules. No effusion or pneumothorax. No acute bony abnormalities. IMPRESSION: 1. Patchy nodular consolidation within the right mid and lower lung zones, which could be inflammatory or infectious. Followup PA and lateral chest X-ray is recommended in 3-4 weeks following trial of antibiotic therapy to ensure resolution and exclude underlying parenchymal lung nodules. Electronically Signed   By: Ozell Daring M.D.   On: 02/18/2024 16:15     .Critical Care  Performed by: Edie Rosebud LABOR, PA-C Authorized by: Edie Rosebud LABOR, PA-C   Critical care provider statement:    Critical care time (minutes):  35   Critical care was necessary to treat or prevent imminent or life-threatening deterioration of the following conditions:  Respiratory failure, dehydration and cardiac failure   Critical care was time spent personally by me on the following activities:  Development of treatment plan with patient or surrogate, discussions with consultants, evaluation of patient's response to treatment, examination of patient, ordering and review of laboratory studies, ordering and review of radiographic studies, ordering and performing treatments and interventions, pulse oximetry, re-evaluation of patient's condition and review of old charts    Medications Ordered in the ED  cefTRIAXone  (ROCEPHIN ) 1 g in sodium chloride  0.9 % 100 mL IVPB (1 g Intravenous New Bag/Given 02/18/24 1936)  azithromycin  (ZITHROMAX ) 500 mg in sodium chloride  0.9 % 250 mL IVPB (has no administration in time range)  magnesium  sulfate IVPB 2 g 50 mL (has no administration in time range)  potassium chloride  10 mEq in 100 mL IVPB (has no administration in time range)  sodium chloride  0.9 % bolus 1,000 mL (0 mLs Intravenous Stopped 02/18/24 1844)  ondansetron  (ZOFRAN ) injection 4 mg (4 mg Intravenous Given 02/18/24 813)   68 year old comes in from home via EMS  for failure to thrive.  She has had progressive decreased appetite, weakness over the last 2 weeks, over the last week she has had abdominal pain, cough, shortness of breath.  Today she was walking and had a  syncopal episode.  She denies any prodrome of symptoms.  She does have some people come in and check on her where they found her on the ground.  She states she was only on the ground for about 5 or 10 minutes.  She did have some weakness and was unable to stand on her own with the fall.  Here patient is afebrile, chronically ill-appearing.  She is thin, cachectic.  Mucous membranes appear dry.    Labs and imaging personally viewed and interpreted:  CBC leukocytosis 16.5, hemoglobin 10.6 Metabolic panel potassium 2.9, creatinine 4.00-baseline 1.5 Troponin 15 Magnesium  1.2 BNP 2311 CK 138 COVID flu RSV negative EKG without ischemic changes Lipase 53 X-ray right tib-fib without acute abnormality X-ray pelvis without acute abnormality X-ray chest right sided pneumonia  Patient reassessed.  Discussed labs and imaging.  CT scan changed to noncontrasted due to patient's new renal failure.  CT chest shows multifocal pneumonia with possible developing cavitary lesion.  Has already gotten Rocephin  and azithromycin .  Discussed results with patient.  She is agreeable for admission.  Discussed with IM teaching service who is agreeable to evaluate patient for admission.  The patient appears reasonably stabilized for admission considering the current resources, flow, and capabilities available in the ED at this time, and I doubt any other [facility] requiring further screening and/or treatment in the ED prior to admission.    Clinical Course as of 02/18/24 1947  Sat Feb 18, 2024  1946 IMTS to admit [BH]    Clinical Course User Index [BH] Khadijah Mastrianni A, PA-C                                 Medical Decision Making Amount and/or Complexity of Data Reviewed Independent Historian: EMS External  Data Reviewed: labs, radiology, ECG and notes. Labs: ordered. Decision-making details documented in ED Course. Radiology: ordered and independent interpretation performed. Decision-making details documented in ED Course. ECG/medicine tests: ordered and independent interpretation performed. Decision-making details documented in ED Course.  Risk OTC drugs. Prescription drug management. Parenteral controlled substances. Decision regarding hospitalization. Diagnosis or treatment significantly limited by social determinants of health.       Final diagnoses:  Weakness  Hypomagnesemia  Elevated brain natriuretic peptide (BNP) level  Acute renal failure, unspecified acute renal failure type  Multifocal pneumonia  Syncope, unspecified syncope type    ED Discharge Orders     None          Beata Beason A, PA-C 02/18/24 1947    Ruthe Cornet, DO 02/18/24 2013

## 2024-02-19 DIAGNOSIS — J188 Other pneumonia, unspecified organism: Secondary | ICD-10-CM

## 2024-02-19 DIAGNOSIS — E8729 Other acidosis: Secondary | ICD-10-CM | POA: Diagnosis not present

## 2024-02-19 DIAGNOSIS — N179 Acute kidney failure, unspecified: Secondary | ICD-10-CM

## 2024-02-19 DIAGNOSIS — N184 Chronic kidney disease, stage 4 (severe): Secondary | ICD-10-CM

## 2024-02-19 DIAGNOSIS — R918 Other nonspecific abnormal finding of lung field: Secondary | ICD-10-CM

## 2024-02-19 DIAGNOSIS — J45909 Unspecified asthma, uncomplicated: Secondary | ICD-10-CM

## 2024-02-19 DIAGNOSIS — E861 Hypovolemia: Secondary | ICD-10-CM | POA: Diagnosis not present

## 2024-02-19 DIAGNOSIS — N390 Urinary tract infection, site not specified: Secondary | ICD-10-CM

## 2024-02-19 DIAGNOSIS — Z79899 Other long term (current) drug therapy: Secondary | ICD-10-CM

## 2024-02-19 DIAGNOSIS — Z7951 Long term (current) use of inhaled steroids: Secondary | ICD-10-CM

## 2024-02-19 DIAGNOSIS — E876 Hypokalemia: Secondary | ICD-10-CM

## 2024-02-19 DIAGNOSIS — Z720 Tobacco use: Secondary | ICD-10-CM

## 2024-02-19 LAB — RENAL FUNCTION PANEL
Albumin: 2.7 g/dL — ABNORMAL LOW (ref 3.5–5.0)
Anion gap: 15 (ref 5–15)
BUN: 24 mg/dL — ABNORMAL HIGH (ref 8–23)
CO2: 11 mmol/L — ABNORMAL LOW (ref 22–32)
Calcium: 8.1 mg/dL — ABNORMAL LOW (ref 8.9–10.3)
Chloride: 115 mmol/L — ABNORMAL HIGH (ref 98–111)
Creatinine, Ser: 3.65 mg/dL — ABNORMAL HIGH (ref 0.44–1.00)
GFR, Estimated: 13 mL/min — ABNORMAL LOW (ref >60)
Glucose, Bld: 106 mg/dL — ABNORMAL HIGH (ref 70–99)
Phosphorus: 3.1 mg/dL (ref 2.5–4.6)
Potassium: 2.2 mmol/L — CL (ref 3.5–5.1)
Sodium: 141 mmol/L (ref 135–145)

## 2024-02-19 LAB — CREATININE, URINE, RANDOM: Creatinine, Urine: 54 mg/dL

## 2024-02-19 LAB — RESPIRATORY PANEL BY PCR

## 2024-02-19 LAB — BASIC METABOLIC PANEL WITH GFR
Anion gap: 13 (ref 5–15)
BUN: 23 mg/dL (ref 8–23)
BUN: 23 mg/dL (ref 8–23)
CO2: 10 mmol/L — ABNORMAL LOW (ref 22–32)
CO2: <7 mmol/L — ABNORMAL LOW (ref 22–32)
Calcium: 7.8 mg/dL — ABNORMAL LOW (ref 8.9–10.3)
Calcium: 7.8 mg/dL — ABNORMAL LOW (ref 8.9–10.3)
Chloride: 119 mmol/L — ABNORMAL HIGH (ref 98–111)
Chloride: 118 mmol/L — ABNORMAL HIGH (ref 98–111)
Creatinine, Ser: 3.39 mg/dL — ABNORMAL HIGH (ref 0.44–1.00)
Creatinine, Ser: 3.39 mg/dL — ABNORMAL HIGH (ref 0.44–1.00)
GFR, Estimated: 14 mL/min — ABNORMAL LOW (ref >60)
GFR, Estimated: 14 mL/min — ABNORMAL LOW (ref >60)
Glucose, Bld: 86 mg/dL (ref 70–99)
Glucose, Bld: 108 mg/dL — ABNORMAL HIGH (ref 70–99)
Potassium: 2.4 mmol/L — CL (ref 3.5–5.1)
Potassium: 3.1 mmol/L — ABNORMAL LOW (ref 3.5–5.1)
Sodium: 142 mmol/L (ref 135–145)
Sodium: 142 mmol/L (ref 135–145)

## 2024-02-19 LAB — CBC
HCT: 26.8 % — ABNORMAL LOW (ref 36.0–46.0)
Hemoglobin: 9.2 g/dL — ABNORMAL LOW (ref 12.0–15.0)
MCH: 33.9 pg (ref 26.0–34.0)
MCHC: 34.3 g/dL (ref 30.0–36.0)
MCV: 98.9 fL (ref 80.0–100.0)
Platelets: 426 K/uL — ABNORMAL HIGH (ref 150–400)
RBC: 2.71 MIL/uL — ABNORMAL LOW (ref 3.87–5.11)
RDW: 15 % (ref 11.5–15.5)
WBC: 17.3 K/uL — ABNORMAL HIGH (ref 4.0–10.5)
nRBC: 0.9 % — ABNORMAL HIGH (ref 0.0–0.2)

## 2024-02-19 LAB — GASTROINTESTINAL PANEL BY PCR, STOOL (REPLACES STOOL CULTURE)

## 2024-02-19 LAB — URINALYSIS, ROUTINE W REFLEX MICROSCOPIC
Bilirubin Urine: NEGATIVE
Glucose, UA: NEGATIVE mg/dL
Ketones, ur: NEGATIVE mg/dL
Nitrite: NEGATIVE
Protein, ur: 30 mg/dL — AB
Specific Gravity, Urine: 1.004 — ABNORMAL LOW (ref 1.005–1.030)
WBC, UA: >50 WBC/hpf (ref 0–5)
pH: 6 (ref 5.0–8.0)

## 2024-02-19 LAB — CHLORIDE, URINE, RANDOM: Chloride Urine: 36 mmol/L

## 2024-02-19 LAB — HIV ANTIBODY (ROUTINE TESTING W REFLEX): HIV Screen 4th Generation wRfx: NONREACTIVE

## 2024-02-19 LAB — NA AND K (SODIUM & POTASSIUM), RAND UR
Potassium Urine: <10 mmol/L
Sodium, Ur: <30 mmol/L

## 2024-02-19 LAB — C DIFFICILE QUICK SCREEN W PCR REFLEX
C Diff antigen: NEGATIVE
C Diff interpretation: NOT DETECTED
C Diff toxin: NEGATIVE

## 2024-02-19 LAB — LACTIC ACID, PLASMA
Lactic Acid, Venous: 2.7 mmol/L (ref 0.5–1.9)
Lactic Acid, Venous: 1.6 mmol/L (ref 0.5–1.9)

## 2024-02-19 LAB — PROCALCITONIN: Procalcitonin: 0.21 ng/mL

## 2024-02-19 LAB — TSH: TSH: 1.339 u[IU]/mL (ref 0.350–4.500)

## 2024-02-19 LAB — MAGNESIUM: Magnesium: 1.9 mg/dL (ref 1.7–2.4)

## 2024-02-19 LAB — MRSA NEXT GEN BY PCR, NASAL: MRSA by PCR Next Gen: NOT DETECTED

## 2024-02-19 MED ORDER — POTASSIUM CHLORIDE 20 MEQ PO PACK
40.0000 meq | PACK | Freq: Two times a day (BID) | ORAL | Status: DC
  Administered 2024-02-19: 40 meq via ORAL
  Filled 2024-02-19: qty 2

## 2024-02-19 MED ORDER — SODIUM CHLORIDE 0.9 % IV SOLN
2.0000 g | INTRAVENOUS | Status: AC
  Administered 2024-02-19 – 2024-02-22 (×4): 2 g via INTRAVENOUS
  Filled 2024-02-19 (×4): qty 20

## 2024-02-19 MED ORDER — SODIUM BICARBONATE 650 MG PO TABS
1300.0000 mg | ORAL_TABLET | Freq: Three times a day (TID) | ORAL | Status: AC
  Administered 2024-02-19 – 2024-02-20 (×3): 1300 mg via ORAL
  Filled 2024-02-19 (×3): qty 2

## 2024-02-19 MED ORDER — POTASSIUM CHLORIDE 20 MEQ PO PACK
40.0000 meq | PACK | Freq: Once | ORAL | Status: AC
  Administered 2024-02-19: 40 meq via ORAL
  Filled 2024-02-19: qty 2

## 2024-02-19 MED ORDER — SODIUM CHLORIDE 0.9 % IV SOLN
100.0000 mg | Freq: Two times a day (BID) | INTRAVENOUS | Status: DC
  Administered 2024-02-19 – 2024-02-20 (×4): 100 mg via INTRAVENOUS
  Filled 2024-02-19 (×5): qty 100

## 2024-02-19 MED ORDER — POTASSIUM CHLORIDE 10 MEQ/100ML IV SOLN
10.0000 meq | INTRAVENOUS | Status: DC
  Filled 2024-02-19: qty 100

## 2024-02-19 MED ORDER — POTASSIUM CHLORIDE 10 MEQ/100ML IV SOLN
10.0000 meq | INTRAVENOUS | Status: AC
  Administered 2024-02-19 (×4): 10 meq via INTRAVENOUS
  Filled 2024-02-19 (×4): qty 100

## 2024-02-19 MED ORDER — POTASSIUM CHLORIDE CRYS ER 20 MEQ PO TBCR
40.0000 meq | EXTENDED_RELEASE_TABLET | Freq: Two times a day (BID) | ORAL | Status: AC
Start: 2024-02-19 — End: 2024-02-20
  Administered 2024-02-19 – 2024-02-20 (×2): 40 meq via ORAL
  Filled 2024-02-19 (×2): qty 2

## 2024-02-19 MED ORDER — POTASSIUM CHLORIDE 10 MEQ/100ML IV SOLN
10.0000 meq | INTRAVENOUS | Status: AC
  Administered 2024-02-19 (×3): 10 meq via INTRAVENOUS
  Filled 2024-02-19 (×3): qty 100

## 2024-02-19 MED ORDER — ORAL CARE MOUTH RINSE: 15.0000 mL | OROMUCOSAL | Status: DC | PRN

## 2024-02-19 MED ORDER — POTASSIUM CHLORIDE CRYS ER 20 MEQ PO TBCR: 40.0000 meq | EXTENDED_RELEASE_TABLET | Freq: Once | ORAL | Status: DC

## 2024-02-19 MED ORDER — ENSURE PLUS HIGH PROTEIN PO LIQD
237.0000 mL | Freq: Two times a day (BID) | ORAL | Status: DC
  Administered 2024-02-19 – 2024-02-25 (×10): 237 mL via ORAL

## 2024-02-19 MED ORDER — LACTATED RINGERS IV BOLUS
1000.0000 mL | Freq: Once | INTRAVENOUS | Status: AC
  Administered 2024-02-19: 1000 mL via INTRAVENOUS

## 2024-02-19 NOTE — Progress Notes (Signed)
 PT Cancellation Note  Patient Details Name: Whitney Parrish MRN: [id number] DOB: Apr 08, 1956   Cancelled Treatment:    Reason Eval/Treat Not Completed: Medical issues which prohibited therapy (Acute PT to hold per department protocol due to potassium in critical range, 2.4. Will follow and re-attempt as appropriate.)  Emie Sommerfeld W, PT, DPT Secure Chat Preferred  Rehab Office [phone]  Kate BRAVO Wendolyn 02/19/2024, 7:44 AM

## 2024-02-19 NOTE — Progress Notes (Signed)
 OT Cancellation Note  Patient Details Name: KIMBERLY NIELAND MRN: [id number] DOB: 16-Feb-1956   Cancelled Treatment:    Reason Eval/Treat Not Completed: Medical issues which prohibited therapy. Noted K was 2.4 which is outside of therapeutic range. Will follow up as pt medically appropriate.   Elma JONETTA Lebron FREDERICK, OTR/L [facility] Acute Rehabilitation Office: [phone]   Elma JONETTA Lebron 02/19/2024, 7:43 AM

## 2024-02-19 NOTE — ED Notes (Addendum)
 Lonell, Secretary asked to page MD on call to report critical lab value to.

## 2024-02-19 NOTE — Progress Notes (Cosign Needed)
 HD#1 SUBJECTIVE:  Patient Summary: Whitney Parrish is a female living with a history of CKD4, hypokalemia, asthma, GERD, psoriasis, TUD, depression, and insomnia who presented with syncope and was admitted for syncopal workup, AKI, and multifocal pneumonia   Overnight Events: Admitted overnight   Interim History: Patient and boyfriend [name] seen bedside.  Patient consented to boyfriend hearing HPI.    Note: Patient slightly unreliable historian.  Possible component of delirium.  Current complaints include a right lateral neck sharp pain that occurred at 11:30 AM 10/18 and is still tender to palpation + occasional abdominal pain.  Reported that she has history of vertigo, however the fall that led to this hospitalization was different.  The room was spinning, she can normally sit herself down but this time she slid down and fell and passed out.  Her family placed her on a bench until EMS could arrive.  Endorses: Increased urinary frequency and discomfort, last bowel movement 10/19, improvement in breathing, runny nose, episodes of vertigo, hyperkeratotic plaques on the ventral surface of foot Denies: Burning with urination, hemoptysis, incontinent episode at time of fall that led to admission, joint pain  Boyfriend [name] helps provide further HPI: Stated that she is having decreased appetite and weakness at home over the past several weeks.  Reports that she had a PNA about a year ago where she also lost a significant amount of weight and was hospitalized, after treatment she did well.  And she reports that he believes she could be having some signs of dementia such as repeatedly asking the same questions, forgetting why she is somewhere i.e. at the store, and trouble remembering things which the patient corroborated.  Since the patient had shoulder surgery she no longer drives.  OBJECTIVE:  Vital Signs: Vitals:   02/18/24 2248 02/19/24 0026 02/19/24 0342 02/19/24 0908  BP:  99/87 (!) 113/57  113/69  Pulse:  69  (!) 55  Resp:  18 15 18   Temp: 97.6 F (36.4 C)   98 F (36.7 C)  TempSrc: Oral     SpO2:  100%  98%  Weight:      Height:       Supplemental O2: Room Air SpO2: 98 %  Filed Weights   02/18/24 1515  Weight: 54.4 kg     Intake/Output Summary (Last 24 hours) at 02/19/2024 1500 Last data filed at 02/19/2024 0500 Gross per 24 hour  Intake 240 ml  Output --  Net 240 ml   Net IO Since Admission: 240 mL [02/19/24 1500]  Physical Exam: Physical Exam Constitutional:      General: She is not in acute distress.    Appearance: She is not ill-appearing or toxic-appearing.  Neck:     Thyroid : Thyroid  tenderness present.     Comments: Tender to palpation along the right anterior neck near midline. Cardiovascular:     Rate and Rhythm: Normal rate and regular rhythm.     Pulses:          Dorsalis pedis pulses are 1+ on the right side and 1+ on the left side.     Heart sounds: No murmur heard.    No friction rub. No gallop.  Pulmonary:     Breath sounds: No decreased air movement. No wheezing, rhonchi or rales.  Abdominal:     General: Bowel sounds are normal.     Palpations: Abdomen is soft.     Tenderness: There is no abdominal tenderness.  Feet:     Comments:  hyperkeratotic plaques on the ventral surface of foot. onychomycosis present bilaterally Skin:    Comments: Decreased skin turgor   Neurological:     Mental Status: She is alert.  Psychiatric:        Attention and Perception: Attention normal.        Mood and Affect: Mood and affect normal.        Speech: Speech normal.        Thought Content: Thought content is delusional (Thought she had a surgery 2 years ago which was actually in 1980.  Stated that the admitting physicians were trying to sell her something).     Patient Lines/Drains/Airways Status     Active Line/Drains/Airways     Name Placement date Placement time Site Days   Peripheral IV 02/18/24 22 G 1.75 Anterior;Right Forearm  02/18/24  1657  Forearm  1            Pertinent labs and imaging:      Latest Ref Rng & Units 02/19/2024    6:03 AM 02/18/2024    3:18 PM 11/03/2022    5:53 PM  CBC  WBC 4.0 - 10.5 K/uL 17.3  16.5  19.4   Hemoglobin 12.0 - 15.0 g/dL 9.2  89.3  8.9   Hematocrit 36.0 - 46.0 % 26.8  31.0  27.4   Platelets 150 - 400 K/uL 426  503  747        Latest Ref Rng & Units 02/19/2024   11:47 AM 02/19/2024    6:03 AM 02/19/2024   12:27 AM  CMP  Glucose 70 - 99 mg/dL 891  86  893   BUN 8 - 23 mg/dL 23  23  24    Creatinine 0.44 - 1.00 mg/dL 6.60  6.60  6.34   Sodium 135 - 145 mmol/L 142  142  141   Potassium 3.5 - 5.1 mmol/L 3.1  2.4  2.2   Chloride 98 - 111 mmol/L 118  119  115   CO2 22 - 32 mmol/L 7  10  11    Calcium 8.9 - 10.3 mg/dL 7.8  7.8  8.1     CT CHEST ABDOMEN PELVIS WO CONTRAST Result Date: 02/18/2024 EXAM: CT CHEST, ABDOMEN AND PELVIS WITHOUT CONTRAST 02/18/2024 07:12:41 PM TECHNIQUE: CT of the chest, abdomen and pelvis was performed without the administration of intravenous contrast. Multiplanar reformatted images are provided for review. Automated exposure control, iterative reconstruction, and/or weight based adjustment of the mA/kV was utilized to reduce the radiation dose to as low as reasonably achievable. COMPARISON: None available. CLINICAL HISTORY: emesis, cp, abd pain. FINDINGS: CHEST: MEDIASTINUM AND LYMPH NODES: Heart and pericardium are unremarkable. Coronary artery calcifications. The central airways are clear. No gross hilar adenopathy with limited evaluation on this noncontrast study. No mediastinal or axillary lymphadenopathy. LUNGS AND PLEURA: Right lower and upper lobe patchy peribronchovascular nodular-like consolidations with question central cavitations along a couple of the nodules of the right upper lobe . Associated centrilobular nodules along the right lower lobe with associated mucus plugging and mild bronchial wall thickening. No pleural effusion or  pneumothorax. ABDOMEN AND PELVIS: LIVER: The liver is unremarkable. GALLBLADDER AND BILE DUCTS: Gallbladder is unremarkable. No biliary ductal dilatation. SPLEEN: No acute abnormality. PANCREAS: No acute abnormality. ADRENAL GLANDS: No acute abnormality. KIDNEYS, URETERS AND BLADDER: Left nephrolithiasis measuring up to 1.2 cm. Right nephrolithiasis measuring up to 0.2cm. No ureterolithiasis bilaterally. No hydroureteronephrosis bilaterally. No perinephric or periureteral stranding. Urinary bladder is unremarkable. GI AND BOWEL:  Stomach demonstrates no acute abnormality. Status post appendectomy. Grossly unremarkable colon that is underdistended. No small bowel dilatation. No definite small bowel wall thickening with limited evaluation on this noncontrast study. There is no bowel obstruction. REPRODUCTIVE ORGANS: Status post hysterectomy. No adnexal masses. PERITONEUM AND RETROPERITONEUM: No ascites. No free air. VASCULATURE: Aorta is normal in caliber. Moderate atherosclerotic plaque. ABDOMINAL AND PELVIS LYMPH NODES: No lymphadenopathy. BONES AND SOFT TISSUES: No acute osseous abnormality. No focal soft tissue abnormality. IMPRESSION: 1. Bronchopneumonia of the right upper and lower lobes with possible developing cavitary lesion in the right upper lobe. No gross hilar adenopathy with limited evaluation on this noncontrast study. Recommend follow-up CT in 3 months to evaluate for complete resolution. 2. Nonobstructive bilateral nephrolithiasis measuring up to 12 mm on the left and 2 mm on the right. 3. Atherosclerotic plaque including coronary artery calcification. 4. Status post hysterectomy and appendectomy. Electronically signed by: Morgane Naveau MD 02/18/2024 07:27 PM EDT RP Workstation: [id number]   CT Cervical Spine Wo Contrast Result Date: 02/18/2024 EXAM: CT CERVICAL SPINE WITHOUT CONTRAST 02/18/2024 07:12:41 PM TECHNIQUE: CT of the cervical spine was performed without the administration of intravenous  contrast. Multiplanar reformatted images are provided for review. Automated exposure control, iterative reconstruction, and/or weight based adjustment of the mA/kV was utilized to reduce the radiation dose to as low as reasonably achievable. COMPARISON: None available. CLINICAL HISTORY: Neck trauma, midline tenderness (Age 50-64y). FINDINGS: CERVICAL SPINE: BONES AND ALIGNMENT: No acute fracture or traumatic malalignment. DEGENERATIVE CHANGES: Multilevel moderate degenerative changes of the spine most prominent at the C5-C6 levels. No associated severe osseous neural foraminal or central canal stenosis. SOFT TISSUES: No prevertebral soft tissue swelling. Heterogeneous enlarged left thyroid  gland with underlying nodules that are difficult to measure but likely measuring up to 1.6 cm. LUNGS: Trace centrilobular and paraseptal and sinus changes. Biapical pleural and pulmonary scarring. IMPRESSION: 1. No acute abnormality of the cervical spine related to the reported neck trauma. 2. Heterogeneous enlarged left thyroid  gland with nodules measuring up to approximately 1.6 cm. Recommend outpatient thyroid  gland ultrasound for further evaluation. 3. Emphysema. Electronically signed by: Morgane Naveau MD 02/18/2024 07:19 PM EDT RP Workstation: [id number]   CT Head Wo Contrast Result Date: 02/18/2024 EXAM: CT HEAD WITHOUT CONTRAST 02/18/2024 07:12:41 PM TECHNIQUE: CT of the head was performed without the administration of intravenous contrast. Automated exposure control, iterative reconstruction, and/or weight based adjustment of the mA/kV was utilized to reduce the radiation dose to as low as reasonably achievable. COMPARISON: None available. CLINICAL HISTORY: Head trauma, moderate-severe; fall, syncope. FINDINGS: BRAIN AND VENTRICLES: Patchy and confluent areas of decreased attenuation are noted throughout the deep and periventricular white matter of the cerebral hemispheres bilaterally suggestive of chronic  microvascular ischemic changes. No acute hemorrhage. No evidence of acute infarct. No hydrocephalus. No extra-axial collection. No mass effect or midline shift. ORBITS: Bilateral lens replacement. No acute abnormality. SINUSES: Right maxillary sinus mucosal thickening. SOFT TISSUES AND SKULL: No acute soft tissue abnormality. No skull fracture. IMPRESSION: 1. No acute intracranial abnormality. Electronically signed by: Morgane Naveau MD 02/18/2024 07:16 PM EDT RP Workstation: [id number]   DG Tibia/Fibula Right Result Date: 02/18/2024 CLINICAL DATA:  Clemens, pain EXAM: RIGHT TIBIA AND FIBULA - 2 VIEW COMPARISON:  11/20/2019 FINDINGS: Frontal and lateral views of the right tibia and fibula are obtained. There are no acute displaced fractures. Alignment of the right knee and ankle is anatomic. Soft tissues are normal. IMPRESSION: 1. No acute displaced fracture. Electronically Signed   By:  Ozell Daring M.D.   On: 02/18/2024 16:18   DG Pelvis 1-2 Views Result Date: 02/18/2024 CLINICAL DATA:  Clemens, weakness EXAM: PELVIS - 1-2 VIEW COMPARISON:  02/01/2018 FINDINGS: Supine frontal view of the pelvis includes both hips. No acute displaced fracture, subluxation, or dislocation. Joint spaces are well preserved. Sacroiliac joints are unremarkable. IMPRESSION: 1. No acute displaced fracture. Electronically Signed   By: Ozell Daring M.D.   On: 02/18/2024 16:17   DG Chest Portable 1 View Result Date: 02/18/2024 CLINICAL DATA:  Dizziness, fell, decreased appetite EXAM: PORTABLE CHEST 1 VIEW COMPARISON:  11/03/2022 FINDINGS: Single frontal view of the chest demonstrates an unremarkable cardiac silhouette. Scattered nodular opacities are seen within the right mid and lower lung zones, which could be inflammatory or infectious. Close follow-up is recommended to assess for resolution and exclude underlying pulmonary nodules. No effusion or pneumothorax. No acute bony abnormalities. IMPRESSION: 1. Patchy nodular  consolidation within the right mid and lower lung zones, which could be inflammatory or infectious. Followup PA and lateral chest X-ray is recommended in 3-4 weeks following trial of antibiotic therapy to ensure resolution and exclude underlying parenchymal lung nodules. Electronically Signed   By: Ozell Daring M.D.   On: 02/18/2024 16:15    ASSESSMENT/PLAN:  Assessment: Principal Problem:   Bronchopneumonia  Plan: Multifocal PNA Cavitary Lesion  WBC 17.3 (increased from 16.5) + Lactic Acidosis (multifactorial), afebrile, lungs clear to auscultation. MRSA negative. Pro calcitonin 0.21. Respiratory viral panel 20 pathogens negative. Saturating 98% on RA. Cavitary lesions differential includes TB, bacterial, fungal, malignancy, autoimmune conditions (GPA, RA). Has history of autoimmune with psoriasis and has worsening kidney function. With pro-calcitonin low, unlikely typical bacterial pneumonia. Covering for atypical pneumonia with doxycycline , will continue ceftriaxone  for UTI as well. Keeping GPA on differential with history of sinusitis, pulmonary cavitation, hematuria, proteinuria, renal failure, and possible skin involvement. Consider C-ANCA. Caution if changing Abx with Qtc prolongation.  - IV ceftriaxone  2 g (start date: 02/19/2024) for 5 days - IV doxycycline  100 mg BID (start date: 02/19/2024) for 3 days   Syncope 2/2 hypovolemia  History of Vertigo  Patient is poor historian. History of vertigo, although patient stated this episode felt different than others. She endorsed feeling as if room was spinning however she then slumped to the ground and passed out. She denied incontinent episode, however according to EMS run sheet she had defecated on herself.  She is unsure if she was confused when she woke up. HR around 50s 10/19. Most recent EKG 10/18 2200: Sinus rhythm, 55 BPM, prolonged QT, flattened T waves. No evidence of heart block. No history of seizures, incontinent episode likely  due to GI concerns leading up to fall. Per EMS, she was hypovolemic and family has noted recurrent episodes of diarrhea + poor PO intake.  - Cardiac monitoring  NAGMA Multifactorial (GI bicarb loss from diarrhea vs Lactic Acidosis vs Type 1/2 RTA) Hypokalemia Differential Diagnosis as listed above: Treatments given as followed. - Lactic acidosis (Lactic Acid 2.7)--> received LR fluids due to hyperchloremia. Unlikely 2/2 sepsis (WBC 17.3, afebrile, SBP low 100s, HR 60s, If septic, being treated with fluids and Abx for UTI and bronchopneumonia), Likely hypoperfusion due to volume loss with diarrhea and poor PO intake as evident by history, AKI, decreased skin turgor, faint pedal pulses. - Suspected RTA Type 1 --> sodium bicarb 1300 mg TID ordered  - Possible RTA Type 2 --> Treat underlying disease - GI bicarb loss --> Supportive care. C. Diff and  GI panel negative. No colitis or bowel obstruction noted on imaging Per last admission note, the patient has chronic hypokalemia.   Labs 02/19/2024 Serum CO2 10 --> <7 Serum Chloride 119  --> 118 AG 14 Serum Na 142 Serum K 2.4 --> 3.1  Serum Mg 1.9 Serum Cr 3.39 Urine Cr 54 Urine Na <30 Urine K <10 Urine Chloride 36 - repleting potassium - sodium bicarb 1300 mg TID ordered  - recheck bicarb with BMP  - follow repeat Lactic Acid  - consult nephrologist if refractory to above treatments.   AKI  CKD4  Cr. On admission 4. Most recent documented from [facility] Cr.  2.1. 02/19/2024 A.m. Cr of 3.39, BUN 23, GFR 14. AKI likely 2/2 poor p.o. intake, GI loss, and poor perfusion.  Elevated BNP is likely in the setting of acute kidney injury as patient is not volume overloaded.  CT imaging with nonobstructive bilateral nephrolithiasis up to 12 mm on the left.  Received fluid bolus in the ED and receiving LR now. Heparin  for DVT ppx due to renal dysfunction. 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  -continue to trend    Nephrolithiasis Hematuria Proteinuria  UA + for moderate Hgb, 30 protein, negative ketones, large leukocytes, many bacteria. Endorsed sharp, midline abdominal pain with urination recently which is now resolved.  CT imaging with evidence of nonobstructive nephrolithiasis bilaterally. Differential dx: citrate stones from RTA vs calcium oxylate stones. Proteinuria could be 2/2 kidney disease, consideration also given to GPA given urine findings, possible rapid decline of urine function, and lung involvement - stable, non-obstructive stones. Urinating well.   UTI  UA + for moderate Hgb, 30 protein, negative ketones, large leukocytes, many bacteria. Endorsed generalized abdominal pain, but pain also along suprapubic palpation. Endorsed increased urinary frequency.   - IV Ceftriaxone  2 g daily ordered  - urine cultures pending   Poor Historian  Possible Dementia  Possible Delirium  Patient is a poor historian. Thought she had a surgery 2 years ago which was actually in 52s.  Stated that the admitting physicians were trying to sell her something. Boyfriend stated that he believes she could be having some signs of dementia such as repeatedly asking the same questions, forgetting why she is somewhere i.e. at the store, and trouble remembering things which the patient corroborated. CT head showed decreased attenuation noted throughout the deep and periventricular white matter of the cerebral hemispheres bilaterally suggestive of chronic microvascular ischemic changes.   - Delirium precautions ordered  Enlarged thyroid  gland on imaging Possible Acute Thyroiditis  Incidental imaging finding of heterogenously enlarged left thyroid  gland with nodules measuring up to 1.6 cm. TSH WNL. Patient complained of right lateral neck pain described as sharp that radiated to the right jaw. On physical exam, nodular thyroid  was noted on palpation with associated tenderness. Free T4 and total T3 ordered. If T4  elevated and still painful, consider treatment with steroids for possibly viral induced Everitt Hahn Thyroiditis (typical treatment usually NSAID and if refractory, steroids 15 mg for 1-2 weeks with taper recommended. NSAIDs CI in this patient with severe renal impairment) - Free T4 and total T3 ordered - Outpatient ultrasound follow-up   Decreased Oral Intake Boyfriend noted decreased oral intake since feeling ill. She is a frail appearing woman. Boyfriend noted that last time she was admitted for pneumonia treatment after she was better her oral intake increased. At home, she does not drink a lot of water, only sodas. Hypovolemic on  exam. Increased skin turgor, volume status likely contributing to over all clinical picture for this patient.  - Consult to registered dietitian, follow recommendations   - Ensure protein supplements twice daily - Continue home mirtazapine   Prolonged QTc on EKG Initial EKG with sinus rhythm and prolonged QTc to 670, repeat EKG 506. Magnesium  1.9 - Holding Qt prolonging home medications  - Cardiac monitor    Psoriasis Previously on biologic therapy (Guselkumab ). Denies joint point. Sees Dermatologist for plaque psoriasis, Dr Jackline, last saw two months ago. HIV non-reactive.  - follow up outpatient   Asthma Smoking History (1pk a week) Per chart review, the patient has history of asthma. Home medication is Dulera . Emphysematous changes seen on CT.  - Continue Breztri daily   Tobacco use disorder  - Nicotine  patch daily - Nicotine  gum as needed   Onychomycosis  onychomycosis present bilateral feet.  Patient denies pain. -Consider podiatry consult outpatient   Best Practice: Diet: Regular diet IVF: Fluids: LR bolus ordered with Lactic Acidosis  VTE: heparin  injection 5,000 Units Start: 02/18/24 2200 Code: Full  Disposition planning: Therapy Recs: None, DME: none Family Contact: boyfriend, at bedside. DISPO: Anticipated discharge pending clinical  discharge  Signature:  Aldahir Litaker [facility]  3:00 PM, 02/19/2024  On Call pager [phone]

## 2024-02-19 NOTE — Plan of Care (Signed)
  Problem: Health Behavior/Discharge Planning: Goal: Ability to manage health-related needs will improve Outcome: Progressing   Problem: Clinical Measurements: Goal: Cardiovascular complication will be avoided Outcome: Progressing   Problem: Activity: Goal: Risk for activity intolerance will decrease Outcome: Progressing   

## 2024-02-19 NOTE — Plan of Care (Signed)
  Problem: Education: Goal: Knowledge of General Education information will improve Description: Including pain rating scale, medication(s)/side effects and non-pharmacologic comfort measures Outcome: Progressing   Problem: Health Behavior/Discharge Planning: Goal: Ability to manage health-related needs will improve Outcome: Progressing   Problem: Clinical Measurements: Goal: Ability to maintain clinical measurements within normal limits will improve Outcome: Progressing Goal: Will remain free from infection Outcome: Progressing Goal: Diagnostic test results will improve Outcome: Progressing Goal: Respiratory complications will improve Outcome: Progressing Goal: Cardiovascular complication will be avoided Outcome: Progressing   Problem: Activity: Goal: Risk for activity intolerance will decrease Outcome: Progressing   Problem: Nutrition: Goal: Adequate nutrition will be maintained Outcome: Progressing   Problem: Coping: Goal: Level of anxiety will decrease Outcome: Progressing   Problem: Elimination: Goal: Will not experience complications related to bowel motility Outcome: Progressing Goal: Will not experience complications related to urinary retention Outcome: Progressing   Problem: Pain Managment: Goal: General experience of comfort will improve and/or be controlled Outcome: Progressing   Problem: Safety: Goal: Ability to remain free from injury will improve Outcome: Progressing   Problem: Skin Integrity: Goal: Risk for impaired skin integrity will decrease Outcome: Progressing   Problem: Education: Goal: Knowledge of condition and prescribed therapy will improve Outcome: Progressing   Problem: Cardiac: Goal: Will achieve and/or maintain adequate cardiac output Outcome: Progressing   Problem: Physical Regulation: Goal: Complications related to the disease process, condition or treatment will be avoided or minimized Outcome: Progressing   Problem:  Activity: Goal: Ability to tolerate increased activity will improve Outcome: Progressing   Problem: Clinical Measurements: Goal: Ability to maintain a body temperature in the normal range will improve Outcome: Progressing   Problem: Respiratory: Goal: Ability to maintain adequate ventilation will improve Outcome: Progressing Goal: Ability to maintain a clear airway will improve Outcome: Progressing

## 2024-02-20 DIAGNOSIS — N179 Acute kidney failure, unspecified: Secondary | ICD-10-CM | POA: Diagnosis not present

## 2024-02-20 DIAGNOSIS — J180 Bronchopneumonia, unspecified organism: Secondary | ICD-10-CM

## 2024-02-20 DIAGNOSIS — N189 Chronic kidney disease, unspecified: Secondary | ICD-10-CM

## 2024-02-20 DIAGNOSIS — E040 Nontoxic diffuse goiter: Secondary | ICD-10-CM

## 2024-02-20 DIAGNOSIS — N390 Urinary tract infection, site not specified: Secondary | ICD-10-CM | POA: Diagnosis not present

## 2024-02-20 LAB — CBC
HCT: 23.9 % — ABNORMAL LOW (ref 36.0–46.0)
Hemoglobin: 8.5 g/dL — ABNORMAL LOW (ref 12.0–15.0)
MCH: 34.3 pg — ABNORMAL HIGH (ref 26.0–34.0)
MCHC: 35.6 g/dL (ref 30.0–36.0)
MCV: 96.4 fL (ref 80.0–100.0)
Platelets: 402 K/uL — ABNORMAL HIGH (ref 150–400)
RBC: 2.48 MIL/uL — ABNORMAL LOW (ref 3.87–5.11)
RDW: 15.1 % (ref 11.5–15.5)
WBC: 16.4 K/uL — ABNORMAL HIGH (ref 4.0–10.5)
nRBC: 0.9 % — ABNORMAL HIGH (ref 0.0–0.2)

## 2024-02-20 LAB — RENAL FUNCTION PANEL
Albumin: 2.3 g/dL — ABNORMAL LOW (ref 3.5–5.0)
Anion gap: 14 (ref 5–15)
BUN: 18 mg/dL (ref 8–23)
CO2: 10 mmol/L — ABNORMAL LOW (ref 22–32)
Calcium: 7.9 mg/dL — ABNORMAL LOW (ref 8.9–10.3)
Chloride: 119 mmol/L — ABNORMAL HIGH (ref 98–111)
Creatinine, Ser: 3.19 mg/dL — ABNORMAL HIGH (ref 0.44–1.00)
GFR, Estimated: 15 mL/min — ABNORMAL LOW (ref >60)
Glucose, Bld: 96 mg/dL (ref 70–99)
Phosphorus: 2.6 mg/dL (ref 2.5–4.6)
Potassium: 2.3 mmol/L — CL (ref 3.5–5.1)
Sodium: 143 mmol/L (ref 135–145)

## 2024-02-20 LAB — BASIC METABOLIC PANEL WITH GFR
Anion gap: 13 (ref 5–15)
BUN: 15 mg/dL (ref 8–23)
CO2: 12 mmol/L — ABNORMAL LOW (ref 22–32)
Calcium: 7.9 mg/dL — ABNORMAL LOW (ref 8.9–10.3)
Chloride: 117 mmol/L — ABNORMAL HIGH (ref 98–111)
Creatinine, Ser: 2.89 mg/dL — ABNORMAL HIGH (ref 0.44–1.00)
GFR, Estimated: 17 mL/min — ABNORMAL LOW (ref >60)
Glucose, Bld: 120 mg/dL — ABNORMAL HIGH (ref 70–99)
Potassium: 2.3 mmol/L — CL (ref 3.5–5.1)
Sodium: 142 mmol/L (ref 135–145)

## 2024-02-20 LAB — MAGNESIUM
Magnesium: 1.3 mg/dL — ABNORMAL LOW (ref 1.7–2.4)
Magnesium: 3.1 mg/dL — ABNORMAL HIGH (ref 1.7–2.4)

## 2024-02-20 LAB — LACTIC ACID, PLASMA: Lactic Acid, Venous: 1.1 mmol/L (ref 0.5–1.9)

## 2024-02-20 LAB — T4, FREE: Free T4: 0.55 ng/dL — ABNORMAL LOW (ref 0.61–1.12)

## 2024-02-20 MED ORDER — MAGNESIUM SULFATE 4 GM/100ML IV SOLN
4.0000 g | Freq: Once | INTRAVENOUS | Status: AC
Start: 2024-02-20 — End: 2024-02-20
  Administered 2024-02-20: 4 g via INTRAVENOUS
  Filled 2024-02-20: qty 100

## 2024-02-20 MED ORDER — POTASSIUM CHLORIDE 10 MEQ/100ML IV SOLN
10.0000 meq | INTRAVENOUS | Status: AC
  Administered 2024-02-20 (×4): 10 meq via INTRAVENOUS
  Filled 2024-02-20 (×4): qty 100

## 2024-02-20 MED ORDER — POTASSIUM CITRATE ER 10 MEQ (1080 MG) PO TBCR
20.0000 meq | EXTENDED_RELEASE_TABLET | Freq: Three times a day (TID) | ORAL | Status: DC
  Administered 2024-02-21 – 2024-02-25 (×13): 20 meq via ORAL
  Filled 2024-02-20 (×16): qty 2

## 2024-02-20 MED ORDER — SODIUM BICARBONATE 8.4 % IV SOLN
INTRAVENOUS | Status: AC
  Filled 2024-02-20 (×3): qty 1000

## 2024-02-20 NOTE — Progress Notes (Addendum)
 Initial Nutrition Assessment  DOCUMENTATION CODES:   Severe malnutrition in context of chronic illness  INTERVENTION:  Ensure Plus High Protein po BID, each supplement provides 350 kcal and 20 grams of protein.  Recommended patient consider daily probiotics outpatient d/t antibiotic use.  The patient will discuss with her provider regarding trial of a different appetite stimulant/antidepressant.  NUTRITION DIAGNOSIS:   Severe Malnutrition related to chronic illness as evidenced by severe fat depletion, severe muscle depletion.  GOAL:   Patient will meet greater than or equal to 90% of their needs, Weight gain   MONITOR:   PO intake, Weight trends, Supplement acceptance  REASON FOR ASSESSMENT:   Consult Poor PO  ASSESSMENT:   PMH CKD4, hypokalemia, HTN, GERD, expl lap with abdominal myomectomy 1986 for injury. Presented with PNA, syncope 2/2 hypovolemia, metabolic acidosis with hypokalemia, AKI/CKD4, nephrolithiasis, UTI, and possible dementia/delirium.  Visited the patient with her boyfriend [name] at bedside. The patient reports that she has had a poor appetite the past 3 weeks. Her UBW is 125-130 lbs which she last weighed about a year ago. She reports recurrent PNA and frequent hospitalizations that have contributed to severe weight loss. Over the past 3 weeks, her typical intake has been just bites of breakfast that consists of eggs, sausage, toast and grits. She was drinking about 1 bottle of Boost daily. She denies N/V but reports that she has had diarrhea for 7-8 years with as much as 5-7 BMs daily. She is working on her second Ensure today. She was put on Remeron  2-3 years ago for depression but states it did nothing to increase her appetite. We discussed the possibility of trying Marinol or other appetite stimulant, which she will discuss with her provider. Recommended daily probiotics since the patient has been on prolonged antibiotics which could be exacerbating  diarrhea. Educated patient on the importance of ONS as meal replacement and for nutrition repletion. Recommended calorically-dense ONS such as Boost VHC upon discharge.  Meal Intake: Patient reports 50% intake of breakfast today - scrambled eggs, bacon, home fries, toast. Patient reports <50% intake of lunch today - cod (she disliked), cole slaw, mac/cheese, dinner roll, banana pudding.   Pertinent Medications: Potassium chloride  40 mEq BID plus 10 mEq IV every hour X 4 Sodium bicarb 1300 mg TID Magnesium  sulfate 4 g IV Mirtazapine  home med - patient states she stopped this a few weeks ago   Labs:      I/O: +2.2 L since admit   NUTRITION - FOCUSED PHYSICAL EXAM:  Flowsheet Row Most Recent Value  Orbital Region No depletion  Upper Arm Region Severe depletion  Thoracic and Lumbar Region Severe depletion  Buccal Region Mild depletion  Temple Region Mild depletion  Clavicle Bone Region Severe depletion  Clavicle and Acromion Bone Region Severe depletion  Scapular Bone Region Severe depletion  Dorsal Hand Severe depletion  Patellar Region Moderate depletion  Anterior Thigh Region Severe depletion  Posterior Calf Region Severe depletion  Edema (RD Assessment) None  Hair Reviewed  Eyes Reviewed  Mouth Reviewed  Skin Reviewed  Nails Reviewed    Diet Order             Diet regular Room service appropriate? Yes; Fluid consistency: Thin  Diet effective now                 Ensure+ HP BID  EDUCATION NEEDS:   Education needs have been addressed  Skin:  Skin Assessment: Reviewed RN Assessment  Last BM:  10/19  Height:   Ht Readings from Last 1 Encounters:  02/18/24 5' 2 (1.575 m)    Weight:   Current stated weight likely inaccurate  Ideal Body Weight:  57 kg  BMI:  Body mass index is 21.95 kg/m.  Estimated Nutritional Needs:   Kcal:  1600-1900  Protein:  70-90  Fluid:  1600-1900    Leverne Ruth, MS, RDN, LDN [place]. [facility] See AMION  for contact information

## 2024-02-20 NOTE — Progress Notes (Addendum)
 HD#2 SUBJECTIVE:  Patient Summary: Whitney Parrish is a female living with a history of CKD4, hypokalemia, asthma, GERD, psoriasis, TUD, depression, and insomnia who presented with syncope and was admitted for syncopal workup, AKI, and multifocal pneumonia   Overnight Events: none   Interim History: Saw patient at bedside this a.m.  Patient still feels dizzy while being seated or when she is walking around.  She mentions that it feels like the room is spinning, similar to previous vertigo.  Endorses suprapubic pain.  No burning on urination.  She reports diarrhea continuing even after admission.  Denies any fevers, chills, SOB.   OBJECTIVE:  Vital Signs: Vitals:   02/20/24 0411 02/20/24 0805 02/20/24 0829 02/20/24 0834  BP: 122/65 109/72    Pulse: 78 66  80  Resp: 18 19  18   Temp: 98.6 F (37 C) (!) 97.4 F (36.3 C)    TempSrc: Oral Oral    SpO2: 98% (!) 70% 93% 92%  Weight:      Height:       Supplemental O2: Room Air SpO2: 92 %  Filed Weights   02/18/24 1515  Weight: 54.4 kg     Intake/Output Summary (Last 24 hours) at 02/20/2024 1432 Last data filed at 02/20/2024 1235 Gross per 24 hour  Intake 1985.25 ml  Output 0 ml  Net 1985.25 ml   Net IO Since Admission: 2,225.25 mL [02/20/24 1432]  Physical Exam: Physical Exam HENT:     Head: Normocephalic.  Neck:     Comments: Thyroid : Multinodular on the left side with no tenderness, no swelling noted.   No anterior cervical lymphadenopathy Cardiovascular:     Rate and Rhythm: Normal rate and regular rhythm.  Pulmonary:     Effort: Pulmonary effort is normal.     Breath sounds: Normal breath sounds.  Abdominal:     Palpations: Abdomen is soft.     Tenderness: There is no guarding or rebound.     Comments: Suprapubic pain on palpation  Musculoskeletal:        General: No tenderness.     Right lower leg: No edema.     Left lower leg: No edema.  Skin:    General: Skin is warm.  Neurological:     Mental  Status: She is alert.     Patient Lines/Drains/Airways Status     Active Line/Drains/Airways     Name Placement date Placement time Site Days   Peripheral IV 02/20/24 22 G 1.75 Anterior;Right Forearm 02/20/24  1129  Forearm  less than 1            Pertinent labs and imaging:      Latest Ref Rng & Units 02/20/2024    6:24 AM 02/19/2024    6:03 AM 02/18/2024    3:18 PM  CBC  WBC 4.0 - 10.5 K/uL 16.4  17.3  16.5   Hemoglobin 12.0 - 15.0 g/dL 8.5  9.2  89.3   Hematocrit 36.0 - 46.0 % 23.9  26.8  31.0   Platelets 150 - 400 K/uL 402  426  503        Latest Ref Rng & Units 02/20/2024    6:24 AM 02/19/2024   11:47 AM 02/19/2024    6:03 AM  CMP  Glucose 70 - 99 mg/dL 96  891  86   BUN 8 - 23 mg/dL 18  23  23    Creatinine 0.44 - 1.00 mg/dL 6.80  6.60  6.60   Sodium 135 -  145 mmol/L 143  142  142   Potassium 3.5 - 5.1 mmol/L 2.3  3.1  2.4   Chloride 98 - 111 mmol/L 119  118  119   CO2 22 - 32 mmol/L 10  <7  10   Calcium 8.9 - 10.3 mg/dL 7.9  7.8  7.8     No results found.  ASSESSMENT/PLAN:  Assessment: Principal Problem:   Bronchopneumonia   Plan: #Multifocal PNA Patient is feeling better.  She denies any fevers, chills, shortness of breath.  Will continue with her antibiotic regimen with ceftriaxone  and doxycycline , monitor WBC.   - IV ceftriaxone  2 g (start date: 02/19/2024): Day 2/5  - IV doxycycline  100 mg BID (start date: 02/19/2024): Day 2/3  Syncope secondary to hypovolemia History of vertigo Hypokalemia Patient syncopal episode was likely secondary to hypovolemia from poor oral intake and volume loss due to diarrhea.  Encouraged good oral intake.  Potassium and magnesium  levels were low today.  Will monitor levels.  We also suspect RTA type I or type II to be the cause of low potassium levels.  Nephrology is consulted for further evaluation of suspected RTA.  -Potassium, magnesium  repleted -Monitor levels with repeat BMP - Nephrology consulted -  Vestibular therapist to evaluate tomorrow, orthostatic VS pending  AKI CKD Creatinine levels still elevated after receiving fluid boluses.  Likely 2/2 to poor p.o. intake, GI loss, suspected RTA type 1 or 2. Cr and K levels haven't improved after fluids or even after being repleted. Have consulted nephrology for further RTA work-up  - Continue to trend Mg, K levels - Nephrology consulted, appreciate recommendations  UTI Patient endorsed suprapubic pain.  Will continue treating with ceftriaxone .  - IV ceftriaxone  2 g (start date: 02/19/2024)  Poor historian Patient was accompanied by family members and boyfriend.  She was surprised to know that she had a UTI today, even though the boyfriend confirmed that the physician mentioned this to her yesterday. She is on delirium precautions.   -On delirium precautions  Enlarged thyroid  gland on imaging  Patient mention having occasional tenderness in thyroid  gland.  TSH levels normal and free T4 levels low.  No tenderness in the thyroid  region noted on PE today.  Can follow-up outpatient for thyroid  ultrasound.  - OP ultrasound follow-up  Chronic conditions:  Psoriasis Sees dermatologist Dr. Lynnell. -f/u OP  Asthma -Continue Breztri daily  TUD - Patch daily   Best Practice: Diet: Regular diet IVF: None VTE: heparin  injection 5,000 Units Start: 02/18/24 2200 Code: Full   Disposition planning: Therapy Recs: None, DME: none Family Contact: boyfriend, at bedside. DISPO: Anticipated discharge pending clinical discharge    Signature:  Rebecka Edgardo [facility]  2:32 PM, 02/20/2024  On Call pager [phone]

## 2024-02-20 NOTE — Evaluation (Signed)
 Physical Therapy Evaluation Patient Details Name: Whitney Parrish MRN: [id number] DOB: [birth year] Today's Date: 02/20/2024  History of Present Illness  Pt is a 68 y.o. female presenting 10/18 after syncopal episode. Found to have multifocal PNA, hypovolemia, hypokalemia, AKI, UTI, delirium, enlarged thyroid  gland, prolonged Qtc on EKG. PMH: CKD4, hypokalemia, asthma, GERD, psoriasis, TUD, depression, and insomnia  Clinical Impression   Pt admitted with above diagnosis. MD cleared OT/PT to eval pt as pt with low K levels this morning and no repeat labs completed yet, but pt with K infusion today as well as PO. Lives at home alone, in a single-level home with an elevator for access; Prior to admission, pt was able to manage independently, using a cane to walk; Presents to PT with generalized weakness, decr activity tolerance (with noted diastolic BP drifting lower and lower with inr time in upright/standing activity), decr balance;  Noting difficulty with VORx1 exercise and with rapid (as possible) eye movemetn between 2 targets, both induced dizziness; Able to get up to stand and walk in the room with min assist/CGA assist; Pt currently with functional limitations due to the deficits listed below (see PT Problem List). Pt will benefit from skilled PT to increase their independence and safety with mobility to allow discharge to the venue listed below.           If plan is discharge home, recommend the following: A little help with walking and/or transfers   Can travel by private vehicle        Equipment Recommendations Rolling walker (2 wheels);BSC/3in1  Recommendations for Other Services  Other (comment) (Vestibular Assessment)    Functional Status Assessment Patient has had a recent decline in their functional status and demonstrates the ability to make significant improvements in function in a reasonable and predictable amount of time.     Precautions / Restrictions  Precautions Precautions: Fall Restrictions Weight Bearing Restrictions Per Provider Order: No      Mobility  Bed Mobility Overal bed mobility: Needs Assistance Bed Mobility: Supine to Sit     Supine to sit: Supervision     General bed mobility comments: increased time    Transfers Overall transfer level: Needs assistance Equipment used: None Transfers: Sit to/from Stand, Bed to chair/wheelchair/BSC Sit to Stand: Contact guard assist   Step pivot transfers: Contact guard assist       General transfer comment: for safety, mild sway in standing, no LOB this session    Ambulation/Gait Ambulation/Gait assistance: Min assist Gait Distance (Feet): 20 Feet Assistive device: 1 person hand held assist Gait Pattern/deviations: Step-through pattern Gait velocity: slowed     General Gait Details: Provided A letters for a gaze stabilization technique for amb in teh room; cues to self-monitor for activity tolerance  Stairs            Wheelchair Mobility     Tilt Bed    Modified Rankin (Stroke Patients Only)       Balance Overall balance assessment: Needs assistance Sitting-balance support: No upper extremity supported, Feet supported Sitting balance-Leahy Scale: Good     Standing balance support: No upper extremity supported, During functional activity Standing balance-Leahy Scale: Fair Standing balance comment: unable to accept additonal challenge outside of ambulation                             Pertinent Vitals/Pain Pain Assessment Pain Assessment: No/denies pain    Home Living Family/patient expects to be  discharged to:: Private residence Living Arrangements: Alone Available Help at Discharge: Family;Friend(s);Available 24 hours/day (daughter all the time) Type of Home: Apartment Home Access: Stairs to enter;Elevator   Entrance Stairs-Number of Steps: lives on the 3rd floor but most often takes elevator   Home Layout: One  level Home Equipment: Cane - single point;Grab bars - tub/shower;Hand held shower head;Shower seat      Prior Function Prior Level of Function : Independent/Modified Independent;Working/employed;Driving             Mobility Comments: cane ADLs Comments: works as a Dealer Upper Extremity Assessment: Defer to OT evaluation    Lower Extremity Assessment Lower Extremity Assessment: Generalized weakness       Communication   Communication Communication: No apparent difficulties    Cognition Arousal: Alert Behavior During Therapy: WFL for tasks assessed/performed   PT - Cognitive impairments: Memory                       PT - Cognition Comments: Asked what her tele box was for multiple times durin gsession Following commands: Impaired Following commands impaired: Follows multi-step commands inconsistently     Cueing Cueing Techniques: Verbal cues     General Comments General comments (skin integrity, edema, etc.):    02/20/24 1500  Vital Signs  Patient Position (if appropriate) Orthostatic Vitals  Orthostatic Lying   BP- Lying 110/87  Pulse- Lying 71  Orthostatic Sitting  BP- Sitting 99/75  Pulse- Sitting 68  Orthostatic Standing at 0 minutes  BP- Standing at 0 minutes 117/65  Pulse- Standing at 0 minutes 70  Orthostatic Standing at 3 minutes  BP- Standing at 3 minutes 114/55  Pulse- Standing at 3 minutes 68   Dizziness throughout; Noting diastolic BP drifting about 10 mmHg lower with each BP    Exercises     Assessment/Plan    PT Assessment Patient needs continued PT services  PT Problem List Decreased strength;Decreased activity tolerance;Decreased balance;Decreased mobility;Decreased coordination;Decreased cognition;Decreased knowledge of use of DME;Decreased safety awareness;Decreased knowledge of precautions;Cardiopulmonary status limiting activity       PT Treatment  Interventions DME instruction;Gait training;Stair training;Functional mobility training;Therapeutic activities;Therapeutic exercise;Balance training;Cognitive remediation;Neuromuscular re-education;Patient/family education;Wheelchair mobility training;Manual techniques    PT Goals (Current goals can be found in the Care Plan section)  Acute Rehab PT Goals Patient Stated Goal: Be able to walk smoother PT Goal Formulation: With patient Time For Goal Achievement: 03/05/24 Potential to Achieve Goals: Good    Frequency Min 3X/week     Co-evaluation               AM-PAC PT 6 Clicks Mobility  Outcome Measure Help needed turning from your back to your side while in a flat bed without using bedrails?: A Little Help needed moving from lying on your back to sitting on the side of a flat bed without using bedrails?: A Little Help needed moving to and from a bed to a chair (including a wheelchair)?: A Little Help needed standing up from a chair using your arms (e.g., wheelchair or bedside chair)?: A Little Help needed to walk in hospital room?: A Little Help needed climbing 3-5 steps with a railing? : A Little 6 Click Score: 18    End of Session Equipment Utilized During Treatment: Gait belt Activity Tolerance: Patient tolerated treatment well Patient left: in chair;with call bell/phone within reach;with chair alarm set;with nursing/sitter in room Nurse Communication:  Mobility status PT Visit Diagnosis: Unsteadiness on feet (R26.81);Muscle weakness (generalized) (M62.81);Dizziness and giddiness (R42)    Time: 1451-1530 PT Time Calculation (min) (ACUTE ONLY): 39 min   Charges:   PT Evaluation $PT Eval Moderate Complexity: 1 Mod   PT General Charges $$ ACUTE PT VISIT: 1 Visit         Silvano Currier, PT  Acute Rehabilitation Services Office [phone] Secure Chat welcomed   Silvano VEAR Currier 02/20/2024, 3:49 PM

## 2024-02-20 NOTE — Plan of Care (Signed)
   Problem: Health Behavior/Discharge Planning: Goal: Ability to manage health-related needs will improve Outcome: Progressing   Problem: Clinical Measurements: Goal: Will remain free from infection Outcome: Progressing

## 2024-02-20 NOTE — Consult Note (Signed)
 Nephrology Consult   Assessment/Recommendations: Whitney Parrish is a/an 68 y.o. female with a past medical history notable for AKI    AKI on CKD 4 -followed by Dr. Norine. Suspect AKI is secondary to prerenal injury, PNA, UTI. No obstruction on CT but does have nephrolithiasis -baseline Cr around low 2's. Cr improving with fluids, down to 3.19. Peak Cr 3.65 -Avoid nephrotoxic medications including NSAIDs and iodinated intravenous contrast exposure unless the latter is absolutely indicated.  Preferred narcotic agents for pain control are hydromorphone , fentanyl , and methadone. Morphine  should not be used. Avoid Baclofen and avoid oral sodium phosphate  and magnesium  citrate based laxatives / bowel preps. Continue strict Input and Output monitoring. Will monitor the patient closely with you and intervene or adjust therapy as indicated by changes in clinical status/labs   Hypokalemia -suspect this is related to diarrhea -given her acidosis, will start potassium citrate, plan to start tomorrow given that she is getting repletion today -replete Mag PRN  Acidosis -secondary to AKI and diarrhea -potassium citrate as above, will supplement with bicarb gtt  Multifocal PNA -per primary service  UTI -abx per primary service  Syncope, vertigo -orthostats negative 10/20 -vestibular PT?  Anemia -will check iron stores -transfuse prn for hgb <7  Recommendations conveyed to primary service.    Ephriam Stank [facility] 02/20/2024 3:54 PM   _____________________________________________________________________________________   History of Present Illness: Whitney Parrish is a/an 68 y.o. female with a past medical history of CD4, hypertension, GERD, psoriasis who presents to [facility] with syncope.  Poor historian.  Possible dehydration as per patient.  Apparently was not eating or drinking very well in the last few months.  Found to have multifocal pneumonia and UTI.  She is being  treated for syncope.  Did have AKI which is improving.  Also found to have hypokalemia and metabolic acidosis. Patient seen examined bedside. Boyfriend in room. They report that she had bad diarrhea since Sat, improved today. Does endorse some slight abd pain but improving. Denies any N/V, chest pain, SOB, swelling.   Medications:  Current Facility-Administered Medications  Medication Dose Route Frequency Provider Last Rate Last Admin   budesonide-glycopyrrolate-formoterol  (BREZTRI) 160-9-4.8 MCG/ACT inhaler 2 puff  2 puff Inhalation BID Nooruddin, Saad, MD   2 puff at 02/20/24 0834   cefTRIAXone  (ROCEPHIN ) 2 g in sodium chloride  0.9 % 100 mL IVPB  2 g Intravenous Q24H Nooruddin, Saad, MD 200 mL/hr at 02/20/24 1221 2 g at 02/20/24 1221   doxycycline  (VIBRAMYCIN ) 100 mg in sodium chloride  0.9 % 250 mL IVPB  100 mg Intravenous BID Nooruddin, Saad, MD 125 mL/hr at 02/20/24 1149 100 mg at 02/20/24 1149   feeding supplement (ENSURE PLUS HIGH PROTEIN) liquid 237 mL  237 mL Oral BID BM Waymond Cart, MD   237 mL at 02/20/24 1000   heparin  injection 5,000 Units  5,000 Units Subcutaneous Q8H Nooruddin, Saad, MD   5,000 Units at 02/20/24 1447   nicotine  (NICODERM CQ  - dosed in mg/24 hours) patch 14 mg  14 mg Transdermal Q0600 Waymond Cart, MD   14 mg at 02/20/24 9479   nicotine  polacrilex (NICORETTE) gum 2 mg  2 mg Oral PRN Waymond Cart, MD       Oral care mouth rinse  15 mL Mouth Rinse PRN Trudy Mliss Dragon, MD       potassium chloride  10 mEq in 100 mL IVPB  10 mEq Intravenous Q1 Hr x 4 Syeda, Raeeha, DO 100 mL/hr at 02/20/24 1550 10  mEq at 02/20/24 1550     ALLERGIES Sulfa antibiotics  MEDICAL HISTORY Past Medical History:  Diagnosis Date   Anxiety    Arthritis    shoulders and hands   Asthma    Benign positional vertigo    Chronically dry eyes    CKD (chronic kidney disease), stage III (HCC)    Depression    Dyspnea on exertion    GERD (gastroesophageal reflux disease)    Hematuria     History of acute pyelonephritis    History of Bell's palsy    1980's left side-- residual lip numbness intermittant   History of chest pain    non-cardiac per dr claudene notes (pt's pcp)   History of panic attacks    Hypertension    Left ureteral stone    Loose bowel movements    chronic-per pt residual from injury during abdominal surgery in 1986   Migraines    Nephrolithiasis    left    Psoriasis    Urgency of urination    Wears dentures    has upper and lower but only wears upper     SOCIAL HISTORY Social History   Socioeconomic History   Marital status: Divorced    Spouse name: Not on file   Number of children: Not on file   Years of education: Not on file   Highest education level: Not on file  Occupational History   Not on file  Tobacco Use   Smoking status: Every Day    Current packs/day: 0.25    Average packs/day: 0.3 packs/day for 30.0 years (7.5 ttl pk-yrs)    Types: Cigarettes   Smokeless tobacco: Never   Tobacco comments:     QUITTING ON HER OWN   down to 5 cig daily  Substance and Sexual Activity   Alcohol use: No   Drug use: No   Sexual activity: Not on file  Other Topics Concern   Not on file  Social History Narrative   Not on file   Social Drivers of Health   Financial Resource Strain: Not on file  Food Insecurity: No Food Insecurity (02/19/2024)   Hunger Vital Sign    Worried About Running Out of Food in the Last Year: Never true    Ran Out of Food in the Last Year: Never true  Transportation Needs: No Transportation Needs (02/19/2024)   PRAPARE - Administrator, Civil Service (Medical): No    Lack of Transportation (Non-Medical): No  Physical Activity: Not on file  Stress: Not on file  Social Connections: Moderately Integrated (02/19/2024)   Social Connection and Isolation Panel    Frequency of Communication with Friends and Family: More than three times a week    Frequency of Social Gatherings with Friends and Family: More  than three times a week    Attends Religious Services: More than 4 times per year    Active Member of [employer] or Organizations: Yes    Attends Banker Meetings: More than 4 times per year    Marital Status: Never married  Intimate Partner Violence: Not At Risk (02/19/2024)   Humiliation, Afraid, Rape, and Kick questionnaire    Fear of Current or Ex-Partner: No    Emotionally Abused: No    Physically Abused: No    Sexually Abused: No     FAMILY HISTORY Family History  Problem Relation Age of Onset   Cancer Mother        Stomach  Cancer Father        Unknown     Review of Systems: 12 systems reviewed Otherwise as per HPI, all other systems reviewed and negative  Physical Exam: Vitals:   02/20/24 0829 02/20/24 0834  BP:    Pulse:  80  Resp:  18  Temp:    SpO2: 93% 92%   Total I/O In: 360 [P.O.:360] Out: 0   Intake/Output Summary (Last 24 hours) at 02/20/2024 1554 Last data filed at 02/20/2024 1235 Gross per 24 hour  Intake 1985.25 ml  Output 0 ml  Net 1985.25 ml   General: well-appearing, no acute distress HEENT: anicteric sclera, oropharynx clear without lesions CV: regular rate, normal rhythm, no murmurs, no gallops, no rubs, no peripheral edema Lungs: clear to auscultation bilaterally, normal work of breathing Abd: soft, non-tender, non-distended Skin: +psoriatic plaques Psych: alert, engaged, appropriate mood and affect Musculoskeletal: no obvious deformities Neuro: normal speech, no gross focal deficits   Test Results Reviewed Lab Results  Component Value Date   NA 143 02/20/2024   K 2.3 (LL) 02/20/2024   CL 119 (H) 02/20/2024   CO2 10 (L) 02/20/2024   BUN 18 02/20/2024   CREATININE 3.19 (H) 02/20/2024   CALCIUM 7.9 (L) 02/20/2024   ALBUMIN 2.3 (L) 02/20/2024   PHOS 2.6 02/20/2024     I have reviewed all relevant outside healthcare records related to the patient's kidney injury.

## 2024-02-20 NOTE — Discharge Instructions (Addendum)
 Dear Ms Kuhnle,  It was a pleasure taking care of you at [facility]. You were admitted for syncope and treated for pneumonia and acute kidney injury. We are discharging you home now that you are doing better.If you dont hear from them by next week, please contact :  [employer]. [address], [place] [address]. [phone]  Please follow the following instructions.  1. Take your medications as prescribed: You are prescribed potassium Chloride   20 mg twice daily, take them exactly as directed. Do not take extra potassium or over-the-counter supplements unless told by your doctor. 2 . Follow-up testing: You will need repeat blood work to check your potassium level. Keep your follow-up appointment with your primary care provider or specialist as scheduled. 3. Watch for warning signs and seek medical attention if you notice: - Muscle weakness or cramping - Irregular or fast heartbeat - Chest pain  -Severe fatigue or lightheadedness - Nausea, vomiting, or numbness/tingling  Your diarrhea was treated with Imodium and I am sending you home with 30-day supply.  Please take this as discussed. If your diarrhea gets better, your kidney doctor or your primary care physician may make changes to this medication.  You have also been prescribed mirtazapine .  This will help with your appetite and also help you sleep better.  Please take this as discussed   Take care,  Dr. Drue Grow, MD

## 2024-02-20 NOTE — Evaluation (Signed)
 Occupational Therapy Evaluation Patient Details Name: Whitney Parrish MRN: [id number] DOB: [birth year] Today's Date: 02/20/2024   History of Present Illness   Pt is a 68 y.o. female presenting 10/18 after syncopal episode. Found to have multifocal PNA, hypovolemia, hypokalemia, AKI, UTI, delirium, enlarged thyroid  gland, prolonged Qtc on EKG. PMH: CKD4, hypokalemia, asthma, GERD, psoriasis, TUD, depression, and insomnia     Clinical Impressions MD cleared OT/PT to eval pt as pt with low K levels this morning and no repeat labs completed yet, but pt with K infusion today as well as PO. PTA, pt lived alone and reports she was independent for BADL, IADL, working, and driving. Upon eval, pt presents with decreased memory, balance, symptoms consistent with vertigo. Pt needing up to set-up for UB ADL and CGA for LB ADL. Pt to continue to benefit from acute OT services. Recommend OP OT for further cognitive assessment after discharge.       If plan is discharge home, recommend the following:   A little help with walking and/or transfers;A little help with bathing/dressing/bathroom;Assistance with cooking/housework;Help with stairs or ramp for entrance;Assist for transportation     Functional Status Assessment   Patient has had a recent decline in their functional status and demonstrates the ability to make significant improvements in function in a reasonable and predictable amount of time.     Equipment Recommendations   Other (comment) (RW)     Recommendations for Other Services         Precautions/Restrictions   Precautions Precautions: Fall Restrictions Weight Bearing Restrictions Per Provider Order: No     Mobility Bed Mobility Overal bed mobility: Needs Assistance Bed Mobility: Supine to Sit     Supine to sit: Supervision     General bed mobility comments: increased time    Transfers Overall transfer level: Needs assistance Equipment used: None Transfers:  Sit to/from Stand, Bed to chair/wheelchair/BSC Sit to Stand: Contact guard assist     Step pivot transfers: Contact guard assist     General transfer comment: for safety, mild sway in standing, no LOB this session      Balance Overall balance assessment: Needs assistance Sitting-balance support: No upper extremity supported, Feet supported Sitting balance-Leahy Scale: Good     Standing balance support: No upper extremity supported, During functional activity Standing balance-Leahy Scale: Fair Standing balance comment: unable to accept additonal challenge outside of ambulation                           ADL either performed or assessed with clinical judgement   ADL Overall ADL's : Needs assistance/impaired Eating/Feeding: Independent;Sitting   Grooming: Set up;Sitting   Upper Body Bathing: Set up;Sitting   Lower Body Bathing: Contact guard assist;Sit to/from stand   Upper Body Dressing : Set up;Sitting   Lower Body Dressing: Contact guard assist;Sit to/from stand   Toilet Transfer: Contact guard assist;Ambulation                   Vision Baseline Vision/History: 0 No visual deficits Ability to See in Adequate Light: 0 Adequate Patient Visual Report: No change from baseline Vision Assessment?: Vision impaired- to be further tested in functional context Additional Comments: pt able to track in all quads with mild discomfort on R with and initially very mild nystagmus at end range on R at eye level     Perception         Praxis  Pertinent Vitals/Pain Pain Assessment Pain Assessment: No/denies pain     Extremity/Trunk Assessment Upper Extremity Assessment Upper Extremity Assessment: Generalized weakness   Lower Extremity Assessment Lower Extremity Assessment: Defer to PT evaluation       Communication Communication Communication: No apparent difficulties   Cognition Arousal: Alert Behavior During Therapy: WFL for tasks  assessed/performed Cognition: No family/caregiver present to determine baseline, Cognition impaired     Awareness: Online awareness impaired Memory impairment (select all impairments): Short-term memory (asking what heart monitor box was 4x this session) Attention impairment (select first level of impairment): Selective attention Executive functioning impairment (select all impairments): Reasoning, Problem solving                   Following commands: Impaired Following commands impaired: Follows multi-step commands inconsistently     Cueing  General Comments   Cueing Techniques: Verbal cues      Exercises     Shoulder Instructions      Home Living Family/patient expects to be discharged to:: Private residence Living Arrangements: Alone Available Help at Discharge: Family;Friend(s);Available 24 hours/day (daughter all the time) Type of Home: Apartment Home Access: Stairs to enter;Elevator Entrance Stairs-Number of Steps: lives on the 3rd floor but most often takes Engineer, structural   Home Layout: One level     Bathroom Shower/Tub: Producer, television/film/video: Standard     Home Equipment: Cane - single point;Grab bars - tub/shower;Hand held shower head;Shower seat          Prior Functioning/Environment Prior Level of Function : Independent/Modified Independent;Working/employed;Driving             Mobility Comments: cane 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 Problem List: Decreased strength;Decreased activity tolerance;Impaired balance (sitting and/or standing);Decreased cognition;Decreased safety awareness   OT Treatment/Interventions: Self-care/ADL training;Therapeutic exercise;DME and/or AE instruction;Therapeutic activities;Patient/family education;Balance training;Cognitive remediation/compensation      OT Goals(Current goals can be found in the care plan section)   Acute Rehab OT Goals Patient Stated Goal: get better OT Goal  Formulation: With patient Time For Goal Achievement: 03/05/24 Potential to Achieve Goals: Good   OT Frequency:  Min 2X/week    Co-evaluation              AM-PAC OT 6 Clicks Daily Activity     Outcome Measure Help from another person eating meals?: None Help from another person taking care of personal grooming?: A Little Help from another person toileting, which includes using toliet, bedpan, or urinal?: A Little Help from another person bathing (including washing, rinsing, drying)?: A Little Help from another person to put on and taking off regular upper body clothing?: A Little Help from another person to put on and taking off regular lower body clothing?: A Little 6 Click Score: 19   End of Session Equipment Utilized During Treatment: Gait belt Nurse Communication: Mobility status  Activity Tolerance: Patient tolerated treatment well Patient left: in chair;with call bell/phone within reach;with chair alarm set;with nursing/sitter in room  OT Visit Diagnosis: Unsteadiness on feet (R26.81);History of falling (Z91.81);Muscle weakness (generalized) (M62.81);Other symptoms and signs involving cognitive function                Time: 1451-1530 OT Time Calculation (min): 39 min Charges:  OT General Charges $OT Visit: 1 Visit OT Evaluation $OT Eval Moderate Complexity: 1 Mod  Elma JONETTA Lebron FREDERICK, OTR/L [facility] Acute Rehabilitation Office: [phone]   Elma JONETTA Lebron 02/20/2024, 2:17 PM

## 2024-02-20 NOTE — Plan of Care (Signed)
   Problem: Activity: Goal: Risk for activity intolerance will decrease Outcome: Progressing

## 2024-02-21 DIAGNOSIS — N184 Chronic kidney disease, stage 4 (severe): Secondary | ICD-10-CM | POA: Diagnosis not present

## 2024-02-21 DIAGNOSIS — R55 Syncope and collapse: Secondary | ICD-10-CM

## 2024-02-21 DIAGNOSIS — J180 Bronchopneumonia, unspecified organism: Secondary | ICD-10-CM | POA: Diagnosis not present

## 2024-02-21 DIAGNOSIS — N179 Acute kidney failure, unspecified: Secondary | ICD-10-CM

## 2024-02-21 DIAGNOSIS — D631 Anemia in chronic kidney disease: Secondary | ICD-10-CM | POA: Diagnosis not present

## 2024-02-21 DIAGNOSIS — R197 Diarrhea, unspecified: Secondary | ICD-10-CM

## 2024-02-21 DIAGNOSIS — Z792 Long term (current) use of antibiotics: Secondary | ICD-10-CM

## 2024-02-21 DIAGNOSIS — E43 Unspecified severe protein-calorie malnutrition: Secondary | ICD-10-CM | POA: Insufficient documentation

## 2024-02-21 LAB — CBC WITH DIFFERENTIAL/PLATELET
Abs Immature Granulocytes: 0.18 K/uL — ABNORMAL HIGH (ref 0.00–0.07)
Basophils Absolute: 0 K/uL (ref 0.0–0.1)
Basophils Relative: 0 %
Eosinophils Absolute: 0.1 K/uL (ref 0.0–0.5)
Eosinophils Relative: 1 %
HCT: 20.5 % — ABNORMAL LOW (ref 36.0–46.0)
Hemoglobin: 7.4 g/dL — ABNORMAL LOW (ref 12.0–15.0)
Immature Granulocytes: 2 %
Lymphocytes Relative: 19 %
Lymphs Abs: 2.1 K/uL (ref 0.7–4.0)
MCH: 33.9 pg (ref 26.0–34.0)
MCHC: 36.1 g/dL — ABNORMAL HIGH (ref 30.0–36.0)
MCV: 94 fL (ref 80.0–100.0)
Monocytes Absolute: 0.4 K/uL (ref 0.1–1.0)
Monocytes Relative: 4 %
Neutro Abs: 8 K/uL — ABNORMAL HIGH (ref 1.7–7.7)
Neutrophils Relative %: 74 %
Platelets: 343 K/uL (ref 150–400)
RBC: 2.18 MIL/uL — ABNORMAL LOW (ref 3.87–5.11)
RDW: 14.6 % (ref 11.5–15.5)
WBC: 10.7 K/uL — ABNORMAL HIGH (ref 4.0–10.5)
nRBC: 1.6 % — ABNORMAL HIGH (ref 0.0–0.2)

## 2024-02-21 LAB — RETICULOCYTES
Immature Retic Fract: 30.1 % — ABNORMAL HIGH (ref 2.3–15.9)
RBC.: 2.21 MIL/uL — ABNORMAL LOW (ref 3.87–5.11)
Retic Count, Absolute: 48.2 K/uL (ref 19.0–186.0)
Retic Ct Pct: 2.2 % (ref 0.4–3.1)

## 2024-02-21 LAB — MAGNESIUM: Magnesium: 2.7 mg/dL — ABNORMAL HIGH (ref 1.7–2.4)

## 2024-02-21 LAB — CBC
HCT: 22.1 % — ABNORMAL LOW (ref 36.0–46.0)
Hemoglobin: 7.8 g/dL — ABNORMAL LOW (ref 12.0–15.0)
MCH: 33.9 pg (ref 26.0–34.0)
MCHC: 35.3 g/dL (ref 30.0–36.0)
MCV: 96.1 fL (ref 80.0–100.0)
Platelets: 342 K/uL (ref 150–400)
RBC: 2.3 MIL/uL — ABNORMAL LOW (ref 3.87–5.11)
RDW: 14.7 % (ref 11.5–15.5)
WBC: 10.1 K/uL (ref 4.0–10.5)
nRBC: 1.6 % — ABNORMAL HIGH (ref 0.0–0.2)

## 2024-02-21 LAB — RENAL FUNCTION PANEL
Albumin: 2.2 g/dL — ABNORMAL LOW (ref 3.5–5.0)
Albumin: 2.3 g/dL — ABNORMAL LOW (ref 3.5–5.0)
Anion gap: 10 (ref 5–15)
Anion gap: 15 (ref 5–15)
BUN: 15 mg/dL (ref 8–23)
BUN: 12 mg/dL (ref 8–23)
CO2: 14 mmol/L — ABNORMAL LOW (ref 22–32)
CO2: 20 mmol/L — ABNORMAL LOW (ref 22–32)
Calcium: 7.6 mg/dL — ABNORMAL LOW (ref 8.9–10.3)
Calcium: 7.2 mg/dL — ABNORMAL LOW (ref 8.9–10.3)
Chloride: 119 mmol/L — ABNORMAL HIGH (ref 98–111)
Chloride: 108 mmol/L (ref 98–111)
Creatinine, Ser: 2.67 mg/dL — ABNORMAL HIGH (ref 0.44–1.00)
Creatinine, Ser: 2.4 mg/dL — ABNORMAL HIGH (ref 0.44–1.00)
GFR, Estimated: 19 mL/min — ABNORMAL LOW (ref >60)
GFR, Estimated: 21 mL/min — ABNORMAL LOW (ref >60)
Glucose, Bld: 105 mg/dL — ABNORMAL HIGH (ref 70–99)
Glucose, Bld: 144 mg/dL — ABNORMAL HIGH (ref 70–99)
Phosphorus: 1 mg/dL — CL (ref 2.5–4.6)
Phosphorus: 3.3 mg/dL (ref 2.5–4.6)
Potassium: 2.5 mmol/L — CL (ref 3.5–5.1)
Potassium: 2.2 mmol/L — CL (ref 3.5–5.1)
Sodium: 143 mmol/L (ref 135–145)
Sodium: 143 mmol/L (ref 135–145)

## 2024-02-21 LAB — TECHNOLOGIST SMEAR REVIEW: Plt Morphology: NORMAL

## 2024-02-21 LAB — IRON AND TIBC
Iron: 82 ug/dL (ref 28–170)
Saturation Ratios: 59 % — ABNORMAL HIGH (ref 10.4–31.8)
TIBC: 140 ug/dL — ABNORMAL LOW (ref 250–450)
UIBC: 58 ug/dL

## 2024-02-21 LAB — FERRITIN: Ferritin: 174 ng/mL (ref 11–307)

## 2024-02-21 LAB — FOLATE: Folate: 3.8 ng/mL — ABNORMAL LOW (ref >5.9)

## 2024-02-21 LAB — T3: T3, Total: 42 ng/dL — ABNORMAL LOW (ref 71–180)

## 2024-02-21 LAB — VITAMIN B12: Vitamin B-12: 175 pg/mL — ABNORMAL LOW (ref 180–914)

## 2024-02-21 MED ORDER — POTASSIUM CHLORIDE CRYS ER 20 MEQ PO TBCR
40.0000 meq | EXTENDED_RELEASE_TABLET | Freq: Once | ORAL | Status: AC
  Administered 2024-02-21: 40 meq via ORAL
  Filled 2024-02-21: qty 2

## 2024-02-21 MED ORDER — DOXYCYCLINE HYCLATE 100 MG PO TABS
100.0000 mg | ORAL_TABLET | Freq: Two times a day (BID) | ORAL | Status: AC
Start: 2024-02-22 — End: 2024-02-24
  Administered 2024-02-22 – 2024-02-23 (×4): 100 mg via ORAL
  Filled 2024-02-21 (×4): qty 1

## 2024-02-21 MED ORDER — CYANOCOBALAMIN 1000 MCG/ML IJ SOLN
1000.0000 ug | Freq: Once | INTRAMUSCULAR | Status: AC
  Administered 2024-02-21: 1000 ug via INTRAMUSCULAR
  Filled 2024-02-21: qty 1

## 2024-02-21 MED ORDER — FOLIC ACID 1 MG PO TABS
1.0000 mg | ORAL_TABLET | Freq: Every day | ORAL | Status: DC
  Administered 2024-02-21 – 2024-02-25 (×5): 1 mg via ORAL
  Filled 2024-02-21 (×5): qty 1

## 2024-02-21 MED ORDER — LOPERAMIDE HCL 2 MG PO CAPS
2.0000 mg | ORAL_CAPSULE | Freq: Once | ORAL | Status: AC
Start: 2024-02-21 — End: 2024-02-21
  Administered 2024-02-21: 2 mg via ORAL
  Filled 2024-02-21: qty 1

## 2024-02-21 MED ORDER — POTASSIUM PHOSPHATES 15 MMOLE/5ML IV SOLN
30.0000 mmol | Freq: Once | INTRAVENOUS | Status: AC
  Administered 2024-02-21: 30 mmol via INTRAVENOUS
  Filled 2024-02-21: qty 10

## 2024-02-21 MED ORDER — LOPERAMIDE HCL 2 MG PO CAPS
2.0000 mg | ORAL_CAPSULE | ORAL | Status: DC | PRN
  Administered 2024-02-22 (×2): 2 mg via ORAL
  Filled 2024-02-21 (×2): qty 1

## 2024-02-21 MED ORDER — DOXYCYCLINE HYCLATE 100 MG PO TABS
100.0000 mg | ORAL_TABLET | Freq: Two times a day (BID) | ORAL | Status: AC
  Administered 2024-02-21 (×2): 100 mg via ORAL
  Filled 2024-02-21 (×2): qty 1

## 2024-02-21 MED ORDER — POTASSIUM CHLORIDE CRYS ER 20 MEQ PO TBCR
40.0000 meq | EXTENDED_RELEASE_TABLET | Freq: Two times a day (BID) | ORAL | Status: AC
Start: 2024-02-21 — End: 2024-02-22
  Administered 2024-02-21 – 2024-02-22 (×2): 40 meq via ORAL
  Filled 2024-02-21 (×2): qty 2

## 2024-02-21 NOTE — Progress Notes (Signed)
 Physical Therapy Treatment Patient Details Name: Whitney Parrish MRN: [id number] DOB: [birth year] Today's Date: 02/21/2024   History of Present Illness Pt is a 68 y.o. female presenting 10/18 after syncopal episode. Found to have multifocal PNA, hypovolemia, hypokalemia, AKI, UTI, delirium, enlarged thyroid  gland, prolonged Qtc on EKG. PMH: CKD4, hypokalemia, asthma, GERD, psoriasis, TUD, depression, and insomnia    PT Comments  Pt progressing towards goals. Currently pt was tested for BPPV and had positive Dix-Hallpike on the R posterior canal. Pt with delayed otoconia movement through endolymph with bursts lasting 30 seconds to 1 min in first, 3rd and 4th position. Pt reports overall decrease in dizziness after Epley with prolonged positioning. Pt with secondary hypofunction of the inner ear due to limitation of head mobility due to symptoms. Pt was able to perform bed mobility, sit to stand and gait at CGA without an AD. Due to pt current functional status, home set up and available assistance at home recommending skilled physical therapy services 3x/week in order to address strength, balance and functional mobility to decrease risk for falls, injury and re-hospitalization.       If plan is discharge home, recommend the following: A little help with walking and/or transfers     Equipment Recommendations  Rolling walker (2 wheels);BSC/3in1       Precautions / Restrictions Precautions Precautions: Fall Recall of Precautions/Restrictions: Intact Restrictions Weight Bearing Restrictions Per Provider Order: No     Mobility  Bed Mobility Overal bed mobility: Needs Assistance Bed Mobility: Supine to Sit     Supine to sit: Supervision     General bed mobility comments: increased time    Transfers Overall transfer level: Needs assistance Equipment used: None Transfers: Sit to/from Stand Sit to Stand: Contact guard assist           General transfer comment: for safety, mild  sway in standing, no LOB this session    Ambulation/Gait Ambulation/Gait assistance: Contact guard assist Gait Distance (Feet): 12 Feet Assistive device: 1 person hand held assist Gait Pattern/deviations: Step-through pattern Gait velocity: slowed Gait velocity interpretation: <1.31 ft/sec, indicative of household ambulator   General Gait Details: pt deferred further gait stating she walked earlier with her friend.      Balance Overall balance assessment: Needs assistance Sitting-balance support: No upper extremity supported, Feet supported Sitting balance-Leahy Scale: Good     Standing balance support: Single extremity supported, No upper extremity supported, During functional activity Standing balance-Leahy Scale: Fair Standing balance comment: pt intermittently holding onto bed rail      Communication Communication Communication: No apparent difficulties  Cognition Arousal: Alert Behavior During Therapy: WFL for tasks assessed/performed   PT - Cognitive impairments: Memory     PT - Cognition Comments: Asked what her tele box was for multiple times during session as well as IV lines Following commands: Impaired Following commands impaired: Follows multi-step commands inconsistently    Cueing Cueing Techniques: Verbal cues     General Comments General comments (skin integrity, edema, etc.): Pt reporting dizziness with supine<>sitting and rolling R in the bed. Pt positive for dizziness with bursts for R posterior canal with Dix-Hallpike. Pt with bursts for ~ 5 min  lasting 30 seconds - 1 min in first position; Most likely viscous endolymph resulting in slow movement of otoconia through the posterior canal. Pt reported no increase in symptoms with second position and then had bursts again in third position for ~ 3 min. In last position (sitting) pt had 2 bursts but overall  reports decrease veritgo. HOB elevated 45 degrees to facilitate continued movement of otoconia. Vital  signs stable on room air during session      Pertinent Vitals/Pain Pain Assessment Pain Assessment: No/denies pain     PT Goals (current goals can now be found in the care plan section) Acute Rehab PT Goals Patient Stated Goal: Be able to walk smoother PT Goal Formulation: With patient Time For Goal Achievement: 03/05/24 Potential to Achieve Goals: Good Progress towards PT goals: Progressing toward goals    Frequency    Min 3X/week      PT Plan  Continue with current POC        AM-PAC PT 6 Clicks Mobility   Outcome Measure  Help needed turning from your back to your side while in a flat bed without using bedrails?: A Little Help needed moving from lying on your back to sitting on the side of a flat bed without using bedrails?: A Little Help needed moving to and from a bed to a chair (including a wheelchair)?: A Little Help needed standing up from a chair using your arms (e.g., wheelchair or bedside chair)?: A Little Help needed to walk in hospital room?: A Little Help needed climbing 3-5 steps with a railing? : A Little 6 Click Score: 18    End of Session Equipment Utilized During Treatment: Gait belt Activity Tolerance: Patient tolerated treatment well Patient left: in bed;with call bell/phone within reach;with bed alarm set Nurse Communication: Mobility status PT Visit Diagnosis: Unsteadiness on feet (R26.81);Muscle weakness (generalized) (M62.81);Dizziness and giddiness (R42)     Time: 8647-8577 PT Time Calculation (min) (ACUTE ONLY): 30 min  Charges:    $Therapeutic Activity: 8-22 mins $Canalith Rep Proc: 8-22 mins PT General Charges $$ ACUTE PT VISIT: 1 Visit                     Dorothyann Maier, DPT, CLT  Acute Rehabilitation Services Office: [phone] (Secure chat preferred)    Dorothyann VEAR Maier 02/21/2024, 2:47 PM

## 2024-02-21 NOTE — Progress Notes (Addendum)
 HD#3 SUBJECTIVE:  Patient Summary: Whitney Parrish is a female living with a history of CKD4, hypokalemia, asthma, GERD, psoriasis, TUD, depression, and insomnia who presented with syncope and was admitted for syncopal workup, AKI, and multifocal pneumonia   Overnight Events: None  Interim History:  Saw pt at bedside this AM. She Reports 6 BM last night that were watery in appearance. She denies red blood in stools, but notes that the BM were very dark and pointed to a black handle to describe the color. She does report that 5 years ago she had a prior episode of very dark stools. Recently, she reports that she has had 3-4 months of dark/black stools. She has had some vomiting in the past but denies black material when she throws up. Denies burning or pain with urination, but does endorse suprapubic tenderness when she pees. Reports that she continues to feel dizzy and uncomfortable with walking by herself. She reports that she feels a little dizzy right now while she is sitting. She has had poor po intake.  OBJECTIVE:  Vital Signs: Vitals:   02/21/24 0803 02/21/24 0900 02/21/24 0930 02/21/24 1021  BP: (!) 106/58     Pulse: 66 67    Resp: 18 15    Temp: 97.8 F (36.6 C)     TempSrc: Oral     SpO2:      Weight:   37.1 kg 36.7 kg  Height:       Supplemental O2: Room Air SpO2: 100 %  Filed Weights   02/18/24 1515 02/21/24 0930 02/21/24 1021  Weight: 54.4 kg 37.1 kg 36.7 kg     Intake/Output Summary (Last 24 hours) at 02/21/2024 1401 Last data filed at 02/21/2024 0927 Gross per 24 hour  Intake 1690 ml  Output 0 ml  Net 1690 ml   Net IO Since Admission: 3,915.25 mL [02/21/24 1401]  Physical Exam: Physical Exam HENT:     Head: Normocephalic.  Eyes:     Comments: Mildly Pale conjunctivae    Cardiovascular:     Rate and Rhythm: Normal rate and regular rhythm.     Comments: Diminished radial pulses BL Pulmonary:     Effort: Pulmonary effort is normal.     Breath sounds:  Normal breath sounds.  Skin:    General: Skin is warm.     Comments: Skin tenting observed  Neurological:     Mental Status: She is alert.     Patient Lines/Drains/Airways Status     Active Line/Drains/Airways     Name Placement date Placement time Site Days   Peripheral IV 02/20/24 22 G 1.75 Anterior;Right Forearm 02/20/24  1129  Forearm  1   Peripheral IV 02/21/24 20 G 1.88 Left;Anterior Antecubital 02/21/24  1138  Antecubital  less than 1            Pertinent labs and imaging:      Latest Ref Rng & Units 02/21/2024    4:08 AM 02/20/2024    6:24 AM 02/19/2024    6:03 AM  CBC  WBC 4.0 - 10.5 K/uL 10.1  16.4  17.3   Hemoglobin 12.0 - 15.0 g/dL 7.8  8.5  9.2   Hematocrit 36.0 - 46.0 % 22.1  23.9  26.8   Platelets 150 - 400 K/uL 342  402  426        Latest Ref Rng & Units 02/21/2024    4:08 AM 02/20/2024    9:00 PM 02/20/2024    6:24 AM  CMP  Glucose 70 - 99 mg/dL 894  879  96   BUN 8 - 23 mg/dL 15  15  18    Creatinine 0.44 - 1.00 mg/dL 7.32  7.10  6.80   Sodium 135 - 145 mmol/L 143  142  143   Potassium 3.5 - 5.1 mmol/L 2.5  2.3  2.3   Chloride 98 - 111 mmol/L 119  117  119   CO2 22 - 32 mmol/L 14  12  10    Calcium 8.9 - 10.3 mg/dL 7.6  7.9  7.9     No results found.  ASSESSMENT/PLAN:  Assessment: Principal Problem:   Bronchopneumonia Active Problems:   Protein-calorie malnutrition, severe   Plan: #Multifocal PNA Pt was feeling better today. Denies fevers, chills. Will continue ctx for 7 days and doxycycline  for 5 days.    - IV ceftriaxone  2 g (start date: 02/19/2024): Day 3/7  - IV doxycycline  100 mg BID (start date: 02/19/2024): Day 3/5   Syncope secondary to hypovolemia History of vertigo Hypokalemia  Patient syncopal episode was likely secondary to hypovolemia from poor oral intake and volume loss due to diarrhea.  Encouraged good oral intake.  Potassium and phosphate levels were still low with increased magnesium  levels.  Nephrology  suspects low levels due to ongoing diarrhea and started her on Potassium citrate.  Patient's metabolic acidosis improving which was likely secondary to AKI and diarrhea.  Nephrology started patient on bicarb gtt.   -Continue potassium citrate 20 mEq p.o. 3 times daily -Received potassium phosphate IV with last dose today -On bicarb gtt. -Monitor electrolyte levels with RFP - Nephrology on board, appreciate recommendations - negative orthostatics - Vestibular therapist to evaluate today  Normocytic anemia Patient mentioned having dark stools in the past and also at present.  Reached out to nursing and asked them to look out for dark stools when she has a bowel movement. Will get further lab work for normocytic anemia.   - Ordered reticulocyte count - Ordered folate, vitamin B12 - ordered Smear - Nursing to inform us  if pt has dark stools    AKI on CKD4 Creatinine levels decreased.   -Encouraged good oral intake   UTI Patient endorsed suprapubic pain.  Will continue treating with ceftriaxone .  - IV ceftriaxone  2 g (start date: 02/19/2024)   Poor historian  -On delirium precautions   Enlarged thyroid  gland on imaging   - OP ultrasound follow-up   Chronic conditions:   Psoriasis Sees dermatologist Dr. Lynnell. -f/u OP   Asthma -Continue Breztri daily   TUD - Patch daily   Best Practice: Diet: Regular diet IVF: None VTE: heparin  injection 5,000 Units Start: 02/18/24 2200 Code: Full   Disposition planning: Therapy Recs: None, DME: none Family Contact: boyfriend, at bedside. DISPO: Anticipated discharge pending clinical discharge  Signature:  Rebecka Edgardo [facility]  2:01 PM, 02/21/2024  On Call pager [phone]

## 2024-02-21 NOTE — Plan of Care (Signed)
  Problem: Clinical Measurements: Goal: Diagnostic test results will improve Outcome: Not Progressing   Problem: Activity: Goal: Risk for activity intolerance will decrease Outcome: Progressing   Problem: Nutrition: Goal: Adequate nutrition will be maintained Outcome: Progressing   Problem: Safety: Goal: Ability to remain free from injury will improve Outcome: Progressing  Gait steady. Electrolytes abnormal-replacing. Pt educated. Diarrhea continues. Incontinent.  Eating better

## 2024-02-21 NOTE — Progress Notes (Signed)
  [facility] Progress Note    Assessment/ Plan:   AKI on CKD 4 -followed by Dr. Norine. Suspect AKI is secondary to prerenal injury, PNA, UTI. No obstruction on CT but does have nephrolithiasis -baseline Cr around low 2's. Peak Cr 4, down to 2.67 today. Cr improving with fluids--would continue especially in light of diarrhea (bicarb gtt) -Avoid nephrotoxic medications including NSAIDs and iodinated intravenous contrast exposure unless the latter is absolutely indicated.  Preferred narcotic agents for pain control are hydromorphone , fentanyl , and methadone. Morphine  should not be used. Avoid Baclofen and avoid oral sodium phosphate  and magnesium  citrate based laxatives / bowel preps. Continue strict Input and Output monitoring. Will monitor the patient closely with you and intervene or adjust therapy as indicated by changes in clinical status/labs    Hypokalemia -suspect this is related to ongoing diarrhea -given her acidosis, started standing potassium citrate -replete Mag  and K PRN   Acidosis -improving -secondary to AKI and diarrhea. Lactic acidosis resolved -potassium citrate as above, supplementing with bicarb gtt   Multifocal PNA -per primary service   UTI -abx per primary service   Syncope, vertigo -orthostats negative 10/20 -vestibular PT?   Anemia -iron replete on panel -transfuse prn for hgb <7 -hgb 7.8-suspect dilutional, if no improvement then can consider ESA    Subjective:   Patient seen and examined bedside. She reports ongoing issues with diarrhea, has had 4 episodes of diarrhea since 6am today. No other complaints.   Objective:   BP (!) 106/58 (BP Location: Right Arm)   Pulse 67   Temp 97.8 F (36.6 C) (Oral)   Resp 15   Ht 5' 2 (1.575 m)   Wt 37.1 kg   SpO2 100%   BMI 14.96 kg/m   Intake/Output Summary (Last 24 hours) at 02/21/2024 0954 Last data filed at 02/21/2024 9072 Gross per 24 hour  Intake 1930 ml  Output 0 ml  Net  1930 ml   Weight change:   Physical Exam: Gen: NAD CVS: RRR Resp: normal wob/unlabored, speaking in full sentences Abd: NT/ND Ext: no edema Neuro: awake, alert  Imaging: No results found.  Labs: BMET Recent Labs  Lab 02/18/24 1518 02/19/24 0027 02/19/24 0603 02/19/24 1147 02/20/24 0624 02/20/24 2100 02/21/24 0408  NA 139 141 142 142 143 142 143  K 2.9* 2.2* 2.4* 3.1* 2.3* 2.3* 2.5*  CL 114* 115* 119* 118* 119* 117* 119*  CO2 11* 11* 10* <7* 10* 12* 14*  GLUCOSE 100* 106* 86 108* 96 120* 105*  BUN 26* 24* 23 23 18 15 15   CREATININE 4.00* 3.65* 3.39* 3.39* 3.19* 2.89* 2.67*  CALCIUM 8.4* 8.1* 7.8* 7.8* 7.9* 7.9* 7.6*  PHOS  --  3.1  --   --  2.6  --  1.0*   CBC Recent Labs  Lab 02/18/24 1518 02/19/24 0603 02/20/24 0624 02/21/24 0408  WBC 16.5* 17.3* 16.4* 10.1  HGB 10.6* 9.2* 8.5* 7.8*  HCT 31.0* 26.8* 23.9* 22.1*  MCV 98.4 98.9 96.4 96.1  PLT 503* 426* 402* 342    Medications:     budesonide-glycopyrrolate-formoterol   2 puff Inhalation BID   doxycycline   100 mg Oral Q12H   feeding supplement  237 mL Oral BID BM   heparin   5,000 Units Subcutaneous Q8H   nicotine   14 mg Transdermal Q0600   potassium citrate  20 mEq Oral TID WC      Ephriam Stank, MD [facility] 02/21/2024, 9:54 AM

## 2024-02-21 NOTE — Care Management Important Message (Signed)
 Important Message  Patient Details  Name: Whitney Parrish MRN: [id number] Date of Birth: 11/05/1955   Important Message Given:  Yes - Medicare IM     Claretta Deed 02/21/2024, 3:16 PM

## 2024-02-22 DIAGNOSIS — R197 Diarrhea, unspecified: Secondary | ICD-10-CM

## 2024-02-22 DIAGNOSIS — N179 Acute kidney failure, unspecified: Secondary | ICD-10-CM | POA: Diagnosis not present

## 2024-02-22 DIAGNOSIS — N184 Chronic kidney disease, stage 4 (severe): Secondary | ICD-10-CM | POA: Diagnosis not present

## 2024-02-22 DIAGNOSIS — J180 Bronchopneumonia, unspecified organism: Secondary | ICD-10-CM | POA: Diagnosis not present

## 2024-02-22 DIAGNOSIS — K529 Noninfective gastroenteritis and colitis, unspecified: Secondary | ICD-10-CM

## 2024-02-22 DIAGNOSIS — D631 Anemia in chronic kidney disease: Secondary | ICD-10-CM | POA: Diagnosis not present

## 2024-02-22 LAB — RENAL FUNCTION PANEL
Albumin: 2.5 g/dL — ABNORMAL LOW (ref 3.5–5.0)
Albumin: 2.5 g/dL — ABNORMAL LOW (ref 3.5–5.0)
Anion gap: 18 — ABNORMAL HIGH (ref 5–15)
Anion gap: 11 (ref 5–15)
BUN: 13 mg/dL (ref 8–23)
BUN: 12 mg/dL (ref 8–23)
CO2: 13 mmol/L — ABNORMAL LOW (ref 22–32)
CO2: 17 mmol/L — ABNORMAL LOW (ref 22–32)
Calcium: 7.6 mg/dL — ABNORMAL LOW (ref 8.9–10.3)
Calcium: 7.1 mg/dL — ABNORMAL LOW (ref 8.9–10.3)
Chloride: 117 mmol/L — ABNORMAL HIGH (ref 98–111)
Chloride: 113 mmol/L — ABNORMAL HIGH (ref 98–111)
Creatinine, Ser: 2.44 mg/dL — ABNORMAL HIGH (ref 0.44–1.00)
Creatinine, Ser: 2.19 mg/dL — ABNORMAL HIGH (ref 0.44–1.00)
GFR, Estimated: 21 mL/min — ABNORMAL LOW
GFR, Estimated: 24 mL/min — ABNORMAL LOW (ref >60)
Glucose, Bld: 93 mg/dL (ref 70–99)
Glucose, Bld: 78 mg/dL (ref 70–99)
Phosphorus: 3.9 mg/dL (ref 2.5–4.6)
Phosphorus: 3.3 mg/dL (ref 2.5–4.6)
Potassium: 2.5 mmol/L — CL (ref 3.5–5.1)
Potassium: 4 mmol/L (ref 3.5–5.1)
Sodium: 148 mmol/L — ABNORMAL HIGH (ref 135–145)
Sodium: 141 mmol/L (ref 135–145)

## 2024-02-22 LAB — CBC
HCT: 23.5 % — ABNORMAL LOW (ref 36.0–46.0)
Hemoglobin: 8.3 g/dL — ABNORMAL LOW (ref 12.0–15.0)
MCH: 33.7 pg (ref 26.0–34.0)
MCHC: 35.3 g/dL (ref 30.0–36.0)
MCV: 95.5 fL (ref 80.0–100.0)
Platelets: 352 K/uL (ref 150–400)
RBC: 2.46 MIL/uL — ABNORMAL LOW (ref 3.87–5.11)
RDW: 15.1 % (ref 11.5–15.5)
WBC: 12.4 K/uL — ABNORMAL HIGH (ref 4.0–10.5)
nRBC: 1.5 % — ABNORMAL HIGH (ref 0.0–0.2)

## 2024-02-22 LAB — MAGNESIUM: Magnesium: 2.1 mg/dL (ref 1.7–2.4)

## 2024-02-22 MED ORDER — SODIUM CHLORIDE 0.9 % IV SOLN
2.0000 g | INTRAVENOUS | Status: DC
  Administered 2024-02-23: 2 g via INTRAVENOUS
  Filled 2024-02-22: qty 20

## 2024-02-22 MED ORDER — POTASSIUM CHLORIDE IN NACL 20-0.9 MEQ/L-% IV SOLN
INTRAVENOUS | Status: DC
  Filled 2024-02-22: qty 1000

## 2024-02-22 MED ORDER — MIRTAZAPINE 15 MG PO TABS
15.0000 mg | ORAL_TABLET | Freq: Every day | ORAL | Status: DC
  Administered 2024-02-22 – 2024-02-24 (×3): 15 mg via ORAL
  Filled 2024-02-22 (×3): qty 1

## 2024-02-22 MED ORDER — LOPERAMIDE HCL 2 MG PO CAPS
2.0000 mg | ORAL_CAPSULE | Freq: Once | ORAL | Status: AC
  Administered 2024-02-22: 2 mg via ORAL
  Filled 2024-02-22: qty 1

## 2024-02-22 MED ORDER — PSYLLIUM 95 % PO PACK
1.0000 | PACK | Freq: Every day | ORAL | Status: DC
Start: 2024-02-22 — End: 2024-02-25
  Administered 2024-02-22 – 2024-02-25 (×4): 1 via ORAL
  Filled 2024-02-22 (×4): qty 1

## 2024-02-22 MED ORDER — SODIUM CHLORIDE 0.9 % IV SOLN: INTRAVENOUS | Status: DC

## 2024-02-22 NOTE — Progress Notes (Addendum)
 [facility] Progress Note    Assessment/ Plan:   AKI on CKD 4 -followed by Dr. Norine. Suspect AKI is secondary to prerenal injury, PNA, UTI. No obstruction on CT but does have nephrolithiasis -baseline Cr around low 2's. Peak Cr 4, down to 2.4. Will continue with isotonic fluids especially in light of ongoing diarrhea (prevent further prerenal injury): NS w/ KCL 75cc/hr x 1 day -encouraged PO hydration w/ water -Avoid nephrotoxic medications including NSAIDs and iodinated intravenous contrast exposure unless the latter is absolutely indicated.  Preferred narcotic agents for pain control are hydromorphone , fentanyl , and methadone. Morphine  should not be used. Avoid Baclofen and avoid oral sodium phosphate  and magnesium  citrate based laxatives / bowel preps. Continue strict Input and Output monitoring. Will monitor the patient closely with you and intervene or adjust therapy as indicated by changes in clinical status/labs    Hypokalemia -suspect this is related to ongoing diarrhea -given her acidosis, started standing potassium citrate -replete Mag  and K PRN -NS with KCL 20meq  infusion as above   Acidosis -improving -secondary to AKI and diarrhea (primarily this). Lactic acidosis resolved -potassium citrate as above  Diarrhea -mgmt per primary service, expect lytes to improve as diarrhea improves   Multifocal PNA -per primary service. On doxy   UTI -abx per primary service. S/p rocephin    Syncope, vertigo -orthostats negative 10/20 -vestibular PT   Anemia -iron replete on panel -transfuse prn for hgb <7 -hgb up to 8.3-suspect dilutional, if no improvement then can consider ESA   Addendum: most recent labs from 11:59am show that her potassium is up to 4 and bicarb up to 17 therefore will readjust fluids to just NS @ 75cc/hr x 1 day Discussed w/ primary service   Subjective:   Patient seen and examined bedside. Still reports ongoing diarrhea. Maybe  slightly improved as per boyfriend. She reports that she has had about 5 episodes of diarrhea already today.   Objective:   BP 105/65   Pulse 65   Temp 98 F (36.7 C)   Resp 18   Ht 5' 2 (1.575 m)   Wt 36.7 kg   SpO2 100%   BMI 14.80 kg/m   Intake/Output Summary (Last 24 hours) at 02/22/2024 1050 Last data filed at 02/21/2024 2200 Gross per 24 hour  Intake 4370.06 ml  Output 0 ml  Net 4370.06 ml   Weight change:   Physical Exam: Gen: NAD CVS: RRR Resp: cta bl, no w/r/r/c, normal wob/unlabored, speaking in full sentences Abd: NT/ND Ext: no edema Neuro: awake, alert  Imaging: No results found.  Labs: BMET Recent Labs  Lab 02/19/24 0027 02/19/24 0603 02/19/24 1147 02/20/24 0624 02/20/24 2100 02/21/24 0408 02/21/24 1833 02/22/24 0550  NA 141 142 142 143 142 143 143 148*  K 2.2* 2.4* 3.1* 2.3* 2.3* 2.5* 2.2* 2.5*  CL 115* 119* 118* 119* 117* 119* 108 117*  CO2 11* 10* <7* 10* 12* 14* 20* 13*  GLUCOSE 106* 86 108* 96 120* 105* 144* 93  BUN 24* 23 23 18 15 15 12 13   CREATININE 3.65* 3.39* 3.39* 3.19* 2.89* 2.67* 2.40* 2.44*  CALCIUM 8.1* 7.8* 7.8* 7.9* 7.9* 7.6* 7.2* 7.6*  PHOS 3.1  --   --  2.6  --  1.0* 3.3 3.9   CBC Recent Labs  Lab 02/20/24 0624 02/21/24 0408 02/21/24 1400 02/22/24 0550  WBC 16.4* 10.1 10.7* 12.4*  NEUTROABS  --   --  8.0*  --   HGB 8.5* 7.8*  7.4* 8.3*  HCT 23.9* 22.1* 20.5* 23.5*  MCV 96.4 96.1 94.0 95.5  PLT 402* 342 343 352    Medications:     budesonide-glycopyrrolate-formoterol   2 puff Inhalation BID   doxycycline   100 mg Oral Q12H   feeding supplement  237 mL Oral BID BM   folic acid  1 mg Oral Daily   heparin   5,000 Units Subcutaneous Q8H   nicotine   14 mg Transdermal Q0600   potassium citrate  20 mEq Oral TID WC   psyllium  1 packet Oral Daily      Ephriam Stank, MD  [facility] 02/22/2024, 10:50 AM

## 2024-02-22 NOTE — Progress Notes (Addendum)
 HD#4 SUBJECTIVE:  Patient Summary: Whitney Parrish is a female living with a history of CKD4, hypokalemia, asthma, GERD, psoriasis, TUD, depression, and insomnia who presented with syncope and was admitted for syncopal workup, AKI, and multifocal pneumonia   Overnight Events:  K levels low at 2.2 for which she received oral potassium  Interim History:  Saw patient at bedside this a.m. She feels the same as yesterday.  Patient denied any fevers, chills.  She mentioned Epley maneuver helped her with her dizziness yesterday but she was still dizzy today.  Patient continues to have multiple bowel movements.  Still endorses suprapubic pain.  Patient mentions she has had diarrhea for years, however, it got worse in the past 2 weeks. Boyfriend did mention that her diarrhea got a little better with when she received Imodium but went back to the same level later. OBJECTIVE:  Vital Signs: Vitals:   02/21/24 1700 02/21/24 2006 02/22/24 0403 02/22/24 0819  BP: 116/66 115/83 113/65 105/65  Pulse: 76 82 61 65  Resp:  17 17 18   Temp: 97.7 F (36.5 C) 98.1 F (36.7 C) 98 F (36.7 C) 98 F (36.7 C)  TempSrc: Oral Oral Oral   SpO2:   94% 100%  Weight:      Height:       Supplemental O2: Room Air SpO2: 100 %  Filed Weights   02/18/24 1515 02/21/24 0930 02/21/24 1021  Weight: 54.4 kg 37.1 kg 36.7 kg     Intake/Output Summary (Last 24 hours) at 02/22/2024 1304 Last data filed at 02/21/2024 2200 Gross per 24 hour  Intake 4070.06 ml  Output 0 ml  Net 4070.06 ml   Net IO Since Admission: 8,285.31 mL [02/22/24 1304]  Physical Exam: Physical Exam Cardiovascular:     Rate and Rhythm: Normal rate and regular rhythm.  Pulmonary:     Effort: Pulmonary effort is normal.     Breath sounds: Normal breath sounds. No wheezing.  Abdominal:     Palpations: Abdomen is soft.     Comments: Suprapubic tenderness present  Skin:    General: Skin is warm.  Neurological:     Mental Status: She is  alert.  Psychiatric:        Mood and Affect: Mood normal.     Patient Lines/Drains/Airways Status     Active Line/Drains/Airways     Name Placement date Placement time Site Days   Peripheral IV 02/21/24 20 G 1.88 Left;Anterior Antecubital 02/21/24  1138  Antecubital  1   Peripheral IV 02/21/24 22 G 1.75 Anterior;Right Forearm 02/21/24  1628  Forearm  1            Pertinent labs and imaging:      Latest Ref Rng & Units 02/22/2024    5:50 AM 02/21/2024    2:00 PM 02/21/2024    4:08 AM  CBC  WBC 4.0 - 10.5 K/uL 12.4  10.7  10.1   Hemoglobin 12.0 - 15.0 g/dL 8.3  7.4  7.8   Hematocrit 36.0 - 46.0 % 23.5  20.5  22.1   Platelets 150 - 400 K/uL 352  343  342        Latest Ref Rng & Units 02/22/2024   11:59 AM 02/22/2024    5:50 AM 02/21/2024    6:33 PM  CMP  Glucose 70 - 99 mg/dL 78  93  855   BUN 8 - 23 mg/dL 12  13  12    Creatinine 0.44 - 1.00 mg/dL  2.19  2.44  2.40   Sodium 135 - 145 mmol/L 141  148  143   Potassium 3.5 - 5.1 mmol/L 4.0  2.5  2.2   Chloride 98 - 111 mmol/L 113  117  108   CO2 22 - 32 mmol/L 17  13  20    Calcium 8.9 - 10.3 mg/dL 7.1  7.6  7.2     No results found.  ASSESSMENT/PLAN:  Assessment: Principal Problem:   Bronchopneumonia Active Problems:   Protein-calorie malnutrition, severe   Acute renal failure   Syncope   Plan:  Hypokalemia Non-anion gap metabolic acidosis AKI on CKD4 Creatinine levels are trending down closer to baseline. Patient's potassium levels continue to remain low despite several days of repletion.  Nephrology suspects ongoing diarrhea to be the cause of hypokalemia and acidosis.  Will continue potassium citrate.  Nephrology also started patient on NS infusion.   -Continue potassium citrate 20 mEq p.o. 3 times daily -Started NS infusion -Monitor electrolyte levels with RFP - Nephrology on board, appreciate recommendations  Chronic diarrhea Patient states she has had diarrhea for years.  However, the  diarrhea got worse in the past 2 weeks.  Gave her a Imodium yesterday which provided some relief before her diarrhea got back.  Discussed with patient to ask the nurses for Imodium to control her diarrhea.  Will start patient on lactose-free ensures and diet to see if that improves her diarrhea.  Also added psyllium to help control diarrhea.  - Received 1 dose of Imodium today - Continue Imodium as needed - Start lactose-free ensures, lactose-free diet  Multifocal PNA Pt was feeling better today.  Continue antibiotics  - IV ceftriaxone  2 g (start date: 02/19/2024): Day 4/5  - IV doxycycline  100 mg BID (start date: 02/19/2024): Day 4/5 - Repeat CT chest recommended in 3 months to follow changes  Normocytic anemia Blood smear had normal WBC, platelet morphologies.  However, her RBCs target cell morphology. Non-specific finding. Patient's low hemoglobin levels can also be attributed to low folate and B12 levels with underlying CKD.  Started her on folate and gave her a B12 injection.  - Started folic acid tablet 1 mg p.o. daily - Received Vitamin B12 1000 mcg IM, start oral supplementation  Severe protein-calorie malnutrition Continuing to have low PO intake which has been chronic per patient and significant other. Dietitian following. Continuing meal supplementation. We discussed trial of mirtazapine  which the patient is agreeable to. Will start at 15 mg dose.  Syncopal episode 2/2 hypovolemia Possible BPPV Patient mentioned her dizziness felt better after a vestibular therapist performed Epley maneuver. However she mentioned her dizziness was still present.   UTI Will continue treating with ceftriaxone .  - IV ceftriaxone  2 g (start date: 02/19/2024)   Poor historian  -On delirium precautions   Enlarged thyroid  gland on imaging   - OP ultrasound follow-up   Chronic conditions:   Psoriasis Sees dermatologist Dr. Lynnell. -f/u OP   Asthma -Continue Breztri daily   TUD -  Patch daily    Best Practice: Diet: Regular diet-lactose-free ensures IVF: None VTE: heparin  injection 5,000 Units Start: 02/18/24 2200 Code: Full   Disposition planning: Therapy Recs: None, DME: none Family Contact: boyfriend, at bedside. DISPO: Anticipated discharge pending clinical discharge    Signature:  Rebecka Edgardo [facility]  1:04 PM, 02/22/2024  On Call pager [phone]

## 2024-02-22 NOTE — TOC CM/SW Note (Signed)
 Transition of Care [facility]) - Inpatient Brief Assessment   Patient Details  Name: Whitney Parrish MRN: [id number] Date of Birth: Apr 11, 1956  Transition of Care [facility]) CM/SW Contact:    Tom-Johnson, Harvest Muskrat, RN Phone Number: 02/22/2024, 2:33 PM   Clinical Narrative:  Patient presented to the ED with progressive Weakness,  poor oral intake, Diarrhea,Weight Loss, Cough,Chills and Syncope. CT showed RUL/RLL Bronchopneumonia and possible developing cavitary also AKI, Nephrology following. On Oral and IV abx.  CM spoke with patient at bedside about needs for post hospital transition. Patient states she lives alone, does not have children. Has three supportive sisters and Nieces and nephews that assist with her care. Modified independent, has a cane, walker and shower seat at home. Rolling walker recommended by Therapy, patient states she has one at home. Does not have a PCP, new patient establishment scheduled per patient's request, info on AVS.   Outpatient PT/OT recommended, patient states she will drive or has a her female friend will transport. Patient gave CM permission to speak with niece, Whitney Parrish [phone]) confirmed patient's friend transports to and from appointments.   Patient not Medically ready for discharge.  CM will continue to follow as patient progresses with care towards discharge.       Transition of Care Asessment: Insurance and Status: Insurance coverage has been reviewed Patient has primary care physician: No (Cardiologist is listed, will schedule new patient at discharge.) Home environment has been reviewed: Yes Prior level of function:: Modified Independent Prior/Current Home Services: No current home services Social Drivers of Health Review: SDOH reviewed no interventions necessary Readmission risk has been reviewed: Yes Transition of care needs: transition of care needs identified, TOC will continue to follow

## 2024-02-22 NOTE — Progress Notes (Signed)
 Occupational Therapy Treatment Patient Details Name: Whitney Parrish MRN: [id number] DOB: 02/25/1956 Today's Date: 02/22/2024   History of present illness Pt is a 68 y.o. female presenting 10/18 after syncopal episode. Found to have multifocal PNA, hypovolemia, hypokalemia, AKI, UTI, delirium, enlarged thyroid  gland, prolonged Qtc on EKG. PMH: CKD4, hypokalemia, asthma, GERD, psoriasis, TUD, depression, and insomnia   OT comments  Pt seen for further cognitive assessment. Pt scoring an 18 on the Short Blessed Test of Cognition indicating impairment consistent with dementia, with errors in STM, problem solving, sequencing, and working memory demonstrating difficulty with delayed memory recall, counting backward, and stating months of the year in the reverse order they happen. Unsure cognitive baseline, but no but no history of memory impairment in chart. Per pt, daughter can provide 24/7 supervision as needed upon return home (no daughter number in chart, so attempted to call niece as pt reports she raised nieces and nephews as her own, but no answer). Recommend direct supervision with medication management, financial management in the home setting with OP OT; otherwise, may need short term SNF or transition to ALF if family unable to provide assist.        If plan is discharge home, recommend the following:  A little help with walking and/or transfers;A little help with bathing/dressing/bathroom;Assistance with cooking/housework;Help with stairs or ramp for entrance;Assist for transportation;Direct supervision/assist for financial management;Direct supervision/assist for medications management   Equipment Recommendations  Other (comment) (RW)    Recommendations for Other Services Speech consult (cog)    Precautions / Restrictions Precautions Precautions: Fall Recall of Precautions/Restrictions: Intact Restrictions Weight Bearing Restrictions Per Provider Order: No       Mobility Bed  Mobility Overal bed mobility: Needs Assistance Bed Mobility: Supine to Sit     Supine to sit: Supervision     General bed mobility comments: increased time    Transfers Overall transfer level: Needs assistance Equipment used: None Transfers: Sit to/from Stand Sit to Stand: Contact guard assist                 Balance Overall balance assessment: Needs assistance Sitting-balance support: No upper extremity supported, Feet supported Sitting balance-Leahy Scale: Good     Standing balance support: Single extremity supported, No upper extremity supported, During functional activity Standing balance-Leahy Scale: Fair                             ADL either performed or assessed with clinical judgement   ADL                                         General ADL Comments: focus session on cognitive assessment    Extremity/Trunk Assessment              Vision       Perception     Praxis     Communication Communication Communication: No apparent difficulties   Cognition Arousal: Alert Behavior During Therapy: WFL for tasks assessed/performed Cognition: No family/caregiver present to determine baseline, Cognition impaired   Orientation impairments: Situation Awareness: Online awareness impaired Memory impairment (select all impairments): Short-term memory, Working [employer] fast streamer, Conservation officer, historic buildings Attention impairment (select first level of impairment): Selective attention Executive functioning impairment (select all impairments): Organization, Sequencing, Reasoning, Problem solving OT - Cognition Comments: Pt participating in short blessed test and scoring an 18  indicating impairment consistent with dementia.                 Following commands: Impaired Following commands impaired: Follows multi-step commands inconsistently      Cueing   Cueing Techniques: Verbal cues  Exercises      Shoulder Instructions        General Comments      Pertinent Vitals/ Pain       Pain Assessment Pain Assessment: No/denies pain  Home Living                                          Prior Functioning/Environment              Frequency  Min 2X/week        Progress Toward Goals  OT Goals(current goals can now be found in the care plan section)  Progress towards OT goals: Progressing toward goals  Acute Rehab OT Goals Patient Stated Goal: get better OT Goal Formulation: With patient Time For Goal Achievement: 03/05/24 Potential to Achieve Goals: Good ADL Goals Additional ADL Goal #1: pt will be mod I for OOB ADL Additional ADL Goal #2: pt will perform pillbox test mod I Additional ADL Goal #3: pt will identify and implement strategies to reduce dizziness while performing mobility (i.e. gaze stabilization) with min cues.  Plan      Co-evaluation                 AM-PAC OT 6 Clicks Daily Activity     Outcome Measure   Help from another person eating meals?: None Help from another person taking care of personal grooming?: A Little Help from another person toileting, which includes using toliet, bedpan, or urinal?: A Little Help from another person bathing (including washing, rinsing, drying)?: A Little Help from another person to put on and taking off regular upper body clothing?: A Little Help from another person to put on and taking off regular lower body clothing?: A Little 6 Click Score: 19    End of Session    OT Visit Diagnosis: Unsteadiness on feet (R26.81);History of falling (Z91.81);Muscle weakness (generalized) (M62.81);Other symptoms and signs involving cognitive function   Activity Tolerance Patient tolerated treatment well   Patient Left in chair;with call bell/phone within reach;with chair alarm set;with nursing/sitter in room   Nurse Communication Mobility status        Time: 9153-9089 OT Time Calculation (min): 24 min  Charges: OT General  Charges $OT Visit: 1 Visit OT Treatments $Self Care/Home Management : 8-22 mins $Cognitive Funtion inital: Initial 15 mins  Whitney Parrish, OTR/L [facility] Acute Rehabilitation Office: [phone]   Whitney JONETTA Lebron 02/22/2024, 9:21 AM

## 2024-02-22 NOTE — Progress Notes (Signed)
   02/21/24 2013  Provider Notification  Provider Name/Title D'Mello,MD, Libby Tobie HAS  Date Provider Notified 02/21/24  Time Provider Notified 2013  Method of Notification Page  Notification Reason Critical Result  Test performed and critical result K+ 2.2  Date Critical Result Received 02/21/24  Time Critical Result Received 2005  Provider response See new orders  Date of Provider Response 02/21/24  Time of Provider Response 2014

## 2024-02-23 DIAGNOSIS — N179 Acute kidney failure, unspecified: Secondary | ICD-10-CM | POA: Diagnosis not present

## 2024-02-23 DIAGNOSIS — E876 Hypokalemia: Secondary | ICD-10-CM | POA: Diagnosis not present

## 2024-02-23 DIAGNOSIS — D649 Anemia, unspecified: Secondary | ICD-10-CM

## 2024-02-23 DIAGNOSIS — N184 Chronic kidney disease, stage 4 (severe): Secondary | ICD-10-CM | POA: Diagnosis not present

## 2024-02-23 DIAGNOSIS — R197 Diarrhea, unspecified: Secondary | ICD-10-CM | POA: Diagnosis not present

## 2024-02-23 LAB — RENAL FUNCTION PANEL
Albumin: 2.2 g/dL — ABNORMAL LOW (ref 3.5–5.0)
Albumin: 2.7 g/dL — ABNORMAL LOW (ref 3.5–5.0)
Anion gap: 12 (ref 5–15)
Anion gap: 13 (ref 5–15)
BUN: 11 mg/dL (ref 8–23)
BUN: 11 mg/dL (ref 8–23)
CO2: 18 mmol/L — ABNORMAL LOW (ref 22–32)
CO2: 17 mmol/L — ABNORMAL LOW (ref 22–32)
Calcium: 7.2 mg/dL — ABNORMAL LOW (ref 8.9–10.3)
Calcium: 7.6 mg/dL — ABNORMAL LOW (ref 8.9–10.3)
Chloride: 115 mmol/L — ABNORMAL HIGH (ref 98–111)
Chloride: 113 mmol/L — ABNORMAL HIGH (ref 98–111)
Creatinine, Ser: 2.02 mg/dL — ABNORMAL HIGH (ref 0.44–1.00)
Creatinine, Ser: 1.97 mg/dL — ABNORMAL HIGH (ref 0.44–1.00)
GFR, Estimated: 26 mL/min — ABNORMAL LOW (ref >60)
GFR, Estimated: 27 mL/min — ABNORMAL LOW (ref >60)
Glucose, Bld: 76 mg/dL (ref 70–99)
Glucose, Bld: 92 mg/dL (ref 70–99)
Phosphorus: 4.1 mg/dL (ref 2.5–4.6)
Phosphorus: 3.3 mg/dL (ref 2.5–4.6)
Potassium: 2.1 mmol/L — CL (ref 3.5–5.1)
Potassium: 2.2 mmol/L — CL (ref 3.5–5.1)
Sodium: 145 mmol/L (ref 135–145)
Sodium: 143 mmol/L (ref 135–145)

## 2024-02-23 LAB — CBC
HCT: 20.2 % — ABNORMAL LOW (ref 36.0–46.0)
Hemoglobin: 7.2 g/dL — ABNORMAL LOW (ref 12.0–15.0)
MCH: 34 pg (ref 26.0–34.0)
MCHC: 35.6 g/dL (ref 30.0–36.0)
MCV: 95.3 fL (ref 80.0–100.0)
Platelets: 294 K/uL (ref 150–400)
RBC: 2.12 MIL/uL — ABNORMAL LOW (ref 3.87–5.11)
RDW: 14.8 % (ref 11.5–15.5)
WBC: 11.5 K/uL — ABNORMAL HIGH (ref 4.0–10.5)
nRBC: 1 % — ABNORMAL HIGH (ref 0.0–0.2)

## 2024-02-23 LAB — BASIC METABOLIC PANEL WITH GFR
Anion gap: 12 (ref 5–15)
BUN: 13 mg/dL (ref 8–23)
CO2: 20 mmol/L — ABNORMAL LOW (ref 22–32)
Calcium: 8.2 mg/dL — ABNORMAL LOW (ref 8.9–10.3)
Chloride: 115 mmol/L — ABNORMAL HIGH (ref 98–111)
Creatinine, Ser: 2.02 mg/dL — ABNORMAL HIGH (ref 0.44–1.00)
GFR, Estimated: 26 mL/min — ABNORMAL LOW (ref >60)
Glucose, Bld: 120 mg/dL — ABNORMAL HIGH (ref 70–99)
Potassium: 3.2 mmol/L — ABNORMAL LOW (ref 3.5–5.1)
Sodium: 147 mmol/L — ABNORMAL HIGH (ref 135–145)

## 2024-02-23 MED ORDER — DICYCLOMINE HCL 10 MG PO CAPS
10.0000 mg | ORAL_CAPSULE | Freq: Three times a day (TID) | ORAL | Status: DC | PRN
Start: 2024-02-23 — End: 2024-02-25

## 2024-02-23 MED ORDER — SODIUM CHLORIDE 0.9 % IV SOLN: INTRAVENOUS | Status: DC

## 2024-02-23 MED ORDER — POTASSIUM CHLORIDE 2 MEQ/ML IV SOLN
INTRAVENOUS | Status: DC
  Filled 2024-02-23: qty 1000

## 2024-02-23 MED ORDER — ACETAMINOPHEN 325 MG PO TABS: 650.0000 mg | ORAL_TABLET | Freq: Four times a day (QID) | ORAL | Status: DC | PRN

## 2024-02-23 MED ORDER — DARBEPOETIN ALFA 60 MCG/0.3ML IJ SOSY
60.0000 ug | PREFILLED_SYRINGE | Freq: Once | INTRAMUSCULAR | Status: AC
  Administered 2024-02-23: 60 ug via SUBCUTANEOUS
  Filled 2024-02-23: qty 0.3

## 2024-02-23 MED ORDER — VITAMIN B-12 1000 MCG PO TABS
1000.0000 ug | ORAL_TABLET | Freq: Every day | ORAL | Status: DC
  Administered 2024-02-23 – 2024-02-25 (×3): 1000 ug via ORAL
  Filled 2024-02-23 (×3): qty 1

## 2024-02-23 MED ORDER — POTASSIUM CHLORIDE CRYS ER 20 MEQ PO TBCR
40.0000 meq | EXTENDED_RELEASE_TABLET | Freq: Two times a day (BID) | ORAL | Status: AC
  Administered 2024-02-23: 40 meq via ORAL
  Filled 2024-02-23 (×3): qty 2

## 2024-02-23 MED ORDER — SIMETHICONE 40 MG/0.6ML PO SUSP: 40.0000 mg | Freq: Four times a day (QID) | ORAL | Status: DC | PRN

## 2024-02-23 MED ORDER — POTASSIUM CHLORIDE 2 MEQ/ML IV SOLN
INTRAVENOUS | Status: DC
  Filled 2024-02-23 (×2): qty 1000

## 2024-02-23 MED ORDER — SODIUM CHLORIDE 0.9 % IV SOLN
INTRAVENOUS | Status: DC
  Filled 2024-02-23: qty 1000

## 2024-02-23 NOTE — Progress Notes (Addendum)
 HD#5 SUBJECTIVE:  Patient Summary: Whitney Parrish is a female living with a history of CKD4, hypokalemia, asthma, GERD, psoriasis, TUD, depression, and insomnia who presented with syncope and was admitted for syncopal workup, AKI, and multifocal pneumonia   Overnight Events: Low potassium of 2.2.  Order was placed to replete potassium which the patient did not receive.   Interim History:  Saw patient at bedside this a.m. Reports feeling good this morning. Denies any feverish feelings. Resolution of lower abdominal pain at this point. Denies difficulty breathing. She does have a hard time taking large pills, but otherwise no issues with eating or breathing. In discussing her diarrhea, she thought that a bowel movement was her urinating. So previously when she was reporting BM, she was peeing a lot. When asked about her pooping, she says it is terrible but also notes that yesterday things changed and she had many fewer stools. She said she had just a little bit of stool on her underwear yesterday. She became tearful during the interview, stating she is worried about her body in general.  OBJECTIVE:  Vital Signs: Vitals:   02/23/24 0534 02/23/24 0859 02/23/24 0913 02/23/24 1212  BP: 124/63  127/69 127/83  Pulse: 66 66 (!) 53   Resp: 14 16    Temp: 98.3 F (36.8 C)  98.4 F (36.9 C)   TempSrc: Oral  Oral   SpO2: 100% 99% 100%   Weight:      Height:       Supplemental O2: Room Air SpO2: 100 %  Filed Weights   02/18/24 1515 02/21/24 0930 02/21/24 1021  Weight: 54.4 kg 37.1 kg 36.7 kg     Intake/Output Summary (Last 24 hours) at 02/23/2024 1408 Last data filed at 02/23/2024 0800 Gross per 24 hour  Intake 477.5 ml  Output 0 ml  Net 477.5 ml   Net IO Since Admission: 9,242.81 mL [02/23/24 1408]  Physical Exam: Physical Exam Cardiovascular:     Rate and Rhythm: Normal rate.  Pulmonary:     Effort: Pulmonary effort is normal.     Breath sounds: Normal breath sounds.   Abdominal:     General: There is no distension.     Palpations: There is no mass.     Tenderness: There is no abdominal tenderness. There is no guarding or rebound.     Hernia: No hernia is present.  Neurological:     Mental Status: She is alert.     Pertinent labs and imaging:      Latest Ref Rng & Units 02/23/2024   12:18 AM 02/22/2024    5:50 AM 02/21/2024    2:00 PM  CBC  WBC 4.0 - 10.5 K/uL 11.5  12.4  10.7   Hemoglobin 12.0 - 15.0 g/dL 7.2  8.3  7.4   Hematocrit 36.0 - 46.0 % 20.2  23.5  20.5   Platelets 150 - 400 K/uL 294  352  343        Latest Ref Rng & Units 02/23/2024   12:17 PM 02/23/2024   12:18 AM 02/22/2024   11:59 AM  CMP  Glucose 70 - 99 mg/dL 92  76  78   BUN 8 - 23 mg/dL 11  11  12    Creatinine 0.44 - 1.00 mg/dL 8.02  7.97  7.80   Sodium 135 - 145 mmol/L 143  145  141   Potassium 3.5 - 5.1 mmol/L 2.2  2.1  4.0   Chloride 98 - 111 mmol/L  113  115  113   CO2 22 - 32 mmol/L 17  18  17    Calcium 8.9 - 10.3 mg/dL 7.6  7.2  7.1     No results found.  ASSESSMENT/PLAN:  Assessment: Principal Problem:   Bronchopneumonia Active Problems:   Protein-calorie malnutrition, severe   Acute renal failure   Syncope   Diarrhea  Plan:  Hypokalemia AKI on CKD4 Creatinine levels improved today.  Potassium levels continue to remain low.  Potassium levels will hopefully get better with repletion and management of diarrhea, although RTA cannot be excluded. Diarrhea much improved yesterday, question alternative etiology such as RTA. Continued aggressive repletion.  - Continue potassium citrate 20 mEq p.o. 3 times daily - Started fluids with KCl: LR 100 cc/h with 60 mEq of Kcl - Received a dose of PO potassium 40 meq - Monitor electrolyte levels with RFP every 12 hours - Nephrology on board, appreciate recommendations   Chronic diarrhea, improved RN mentioned patient patient had 3-4 BM in total yesterday which was a reduction from 6 BM a couple of days ago.   Also mentioned that BM not large in volume and are like smears. This along with information from the patient, points to the fact that there is some improvement in her diarrhea. Communicated with RN to inform us  if she has a large BM so we can order a potential stool osmotic gap study on it.  - Continue Imodium as needed, fiber daily, lactose-free diet/supplements - RN asked to document frequency and amount of stool during each BM  Multifocal PNA Pt was feeling better today.  Last day of ABX   - IV ceftriaxone  2 g (start date: 02/19/2024): Day 5/5  - IV doxycycline  100 mg BID (start date: 02/19/2024): Day 5/5 - Repeat CT chest recommended in 3 months to follow changes   Normocytic anemia Started oral vitamin B12 supplementation   - Started folic acid tablet 1 mg p.o. daily - Started Vitamin B12 1000 mcg oral supplementation   Severe protein-calorie malnutrition Discussed with her the importance of increasing p.o. intake.  Patient stated understanding. -Normal diet p.o. -On feeding supplement  UTI Suprapubic pain resolved.  Will complete ceftriaxone  today. - Discontinue IV ceftriaxone  2 g (start date: 02/19/2024)   Poor historian  -On delirium precautions   Enlarged thyroid  gland on imaging   - OP ultrasound follow-up   Chronic conditions:   Psoriasis Sees dermatologist Dr. Lynnell. -f/u OP   Asthma -Continue Breztri daily   TUD - Patch daily    Plan:  Best Practice: Diet: Regular diet-lactose-free ensures IVF: None VTE: heparin  injection 5,000 Units Start: 02/18/24 2200 Code: Full   Disposition planning: Therapy Recs: None, DME: none Family Contact: boyfriend, at bedside. DISPO: Anticipated discharge pending clinical discharge  Signature:  Rebecka Edgardo [facility]  2:08 PM, 02/23/2024  On Call pager [phone]

## 2024-02-23 NOTE — Progress Notes (Addendum)
 Date and time results received: 02/23/24 1307   Test: Potassium   Critical Value: 2.2  Name of Provider Notified: Dr. Jeronimo   Orders Received? Or Actions Taken?: MD aware

## 2024-02-23 NOTE — Plan of Care (Signed)

## 2024-02-23 NOTE — Progress Notes (Signed)
 Mobility Specialist Progress Note:    02/23/24 1100  Mobility  Activity Refused and notified nurse if applicable   Pt refused mobility unable to state why. Pt kept repeating I need to walk, I want to walk. When MS offered to get patient up pt stated, come back later but not today. Will f/u as able.    Thersia Minder Mobility Specialist  Please contact vis Secure Chat or  Rehab Office [phone]

## 2024-02-23 NOTE — Progress Notes (Signed)
 [facility] Progress Note    Assessment/ Plan:   AKI on CKD 4 -followed by Dr. Norine. Suspect AKI is secondary to prerenal injury, PNA, UTI. No obstruction on CT but does have nephrolithiasis -baseline Cr around low 2's. Peak Cr 4, down to 2--at around baseline. C/w istonic fluids for today -encouraged PO hydration w/ water -Avoid nephrotoxic medications including NSAIDs and iodinated intravenous contrast exposure unless the latter is absolutely indicated.  Preferred narcotic agents for pain control are hydromorphone , fentanyl , and methadone. Morphine  should not be used. Avoid Baclofen and avoid oral sodium phosphate  and magnesium  citrate based laxatives / bowel preps. Continue strict Input and Output monitoring. Will monitor the patient closely with you and intervene or adjust therapy as indicated by changes in clinical status/labs    Hypokalemia -suspect this is related to ongoing diarrhea, should hopefully improve soon as diarrhea is apparently improving. Can't exclude RTA. Will check urine potassium to cr ratio -given her acidosis, started standing potassium citrate 20meq TID -replete Mag  and K PRN -fluids with KCL: LR 100cc/hr with 60meq KCl   Acidosis -improving/stable at 18 -secondary to AKI and diarrhea (primarily this). Lactic acidosis resolved -potassium citrate as above -RTA 1 or 2 can also present with hyperchloremic acidosis, checking spot urine K:Cr ratio  Diarrhea -mgmt per primary service, expect lytes to improve as diarrhea improves   Multifocal PNA -per primary service. On doxy   UTI -abx per primary service. On rocephin    Syncope, vertigo -orthostats negative 10/20 -vestibular PT   Anemia -iron replete on panel -transfuse prn for hgb <7 -hgb 7.2 -will give a dose of ESA today: aranesp 60mcg x 1   Discussed w/ primary service.   Subjective:   Patient seen and examined bedside. She reports that her diarrhea is much better today. She  reports that she has been having on and off severe diarrhea for years now. Denies any    Objective:   BP 127/69 (BP Location: Right Arm)   Pulse (!) 53   Temp 98.4 F (36.9 C) (Oral)   Resp 16   Ht 5' 2 (1.575 m)   Wt 36.7 kg   SpO2 100%   BMI 14.80 kg/m   Intake/Output Summary (Last 24 hours) at 02/23/2024 0954 Last data filed at 02/23/2024 0800 Gross per 24 hour  Intake 957.5 ml  Output 0 ml  Net 957.5 ml   Weight change:   Physical Exam: Gen: NAD CVS: RRR Resp: cta bl, no w/r/r/c, normal wob/unlabored, speaking in full sentences Abd: NT/ND Ext: no edema Neuro: awake, alert  Imaging: No results found.  Labs: BMET Recent Labs  Lab 02/19/24 0027 02/19/24 0603 02/20/24 9375 02/20/24 2100 02/21/24 0408 02/21/24 1833 02/22/24 0550 02/22/24 1159 02/23/24 0018  NA 141   < > 143 142 143 143 148* 141 145  K 2.2*   < > 2.3* 2.3* 2.5* 2.2* 2.5* 4.0 2.1*  CL 115*   < > 119* 117* 119* 108 117* 113* 115*  CO2 11*   < > 10* 12* 14* 20* 13* 17* 18*  GLUCOSE 106*   < > 96 120* 105* 144* 93 78 76  BUN 24*   < > 18 15 15 12 13 12 11   CREATININE 3.65*   < > 3.19* 2.89* 2.67* 2.40* 2.44* 2.19* 2.02*  CALCIUM 8.1*   < > 7.9* 7.9* 7.6* 7.2* 7.6* 7.1* 7.2*  PHOS 3.1  --  2.6  --  1.0* 3.3 3.9 3.3 4.1   < > =  values in this interval not displayed.   CBC Recent Labs  Lab 02/21/24 0408 02/21/24 1400 02/22/24 0550 02/23/24 0018  WBC 10.1 10.7* 12.4* 11.5*  NEUTROABS  --  8.0*  --   --   HGB 7.8* 7.4* 8.3* 7.2*  HCT 22.1* 20.5* 23.5* 20.2*  MCV 96.1 94.0 95.5 95.3  PLT 342 343 352 294    Medications:     budesonide-glycopyrrolate-formoterol   2 puff Inhalation BID   vitamin B-12  1,000 mcg Oral Daily   doxycycline   100 mg Oral Q12H   feeding supplement  237 mL Oral BID BM   folic acid  1 mg Oral Daily   heparin   5,000 Units Subcutaneous Q8H   mirtazapine   15 mg Oral QHS   nicotine   14 mg Transdermal Q0600   potassium chloride   40 mEq Oral BID   potassium  citrate  20 mEq Oral TID WC   psyllium  1 packet Oral Daily      Ephriam Stank, MD [facility] 02/23/2024, 9:54 AM

## 2024-02-23 NOTE — Progress Notes (Signed)
 Physical Therapy Treatment Patient Details Name: Whitney Parrish MRN: [id number] DOB: [birth year] Today's Date: 02/23/2024   History of Present Illness Pt is a 68 y.o. female presenting 10/18 after syncopal episode. Found to have multifocal PNA, hypovolemia, hypokalemia, AKI, UTI, delirium, enlarged thyroid  gland, prolonged Qtc on EKG. PMH: CKD4, hypokalemia, asthma, GERD, psoriasis, TUD, depression, and insomnia.    PT Comments  Continues to make progress towards acute functional goals. Educated on rollator use today which she prefers over RW. Educated on safety awareness, and proper use to maximize safety and enable increased activity as tolerated. Patient will continue to benefit from skilled physical therapy services to further improve independence with functional mobility. Noted OT recommendations for direct supervision with med management with OT, concern for self-care at home without adequate family support. Will continue to follow and progress towards PT goals.     If plan is discharge home, recommend the following: A little help with walking and/or transfers;Direct supervision/assist for medications management;Direct supervision/assist for financial management   Can travel by private vehicle        Equipment Recommendations  BSC/3in1;Rollator (4 wheels)    Recommendations for Other Services Other (comment) (Vestibular Assessment)     Precautions / Restrictions Precautions Precautions: Fall Recall of Precautions/Restrictions: Intact Restrictions Weight Bearing Restrictions Per Provider Order: No     Mobility  Bed Mobility               General bed mobility comments: Sitting EOB    Transfers Overall transfer level: Needs assistance Equipment used: None, Rollator (4 wheels) Transfers: Sit to/from Stand Sit to Stand: Contact guard assist           General transfer comment: CGA for safety and instructions for set-up and use with rollator. Good control without  AD but slow to rise and less confident.    Ambulation/Gait Ambulation/Gait assistance: Contact guard assist Gait Distance (Feet): 80 Feet Assistive device: IV Pole, Rollator (4 wheels) Gait Pattern/deviations: Step-through pattern, Drifts right/left, Decreased stride length Gait velocity: slowed Gait velocity interpretation: <1.31 ft/sec, indicative of household ambulator   General Gait Details: Minor instability/drift with rollator, noting improvement with distance and further instruction/awareness. Challenged with navigating obstacles, and sharp turns. Attempted short distance without AD but reaches for IV pole for security/confidence. Less stable with narrow BOS noted at that point.   Stairs             Wheelchair Mobility     Tilt Bed    Modified Rankin (Stroke Patients Only)       Balance Overall balance assessment: Needs assistance Sitting-balance support: No upper extremity supported, Feet supported Sitting balance-Leahy Scale: Good     Standing balance support: No upper extremity supported Standing balance-Leahy Scale: Fair Standing balance comment: More stable with Ad                            Communication Communication Communication: No apparent difficulties  Cognition Arousal: Alert Behavior During Therapy: WFL for tasks assessed/performed   PT - Cognitive impairments: Memory, Problem solving                       PT - Cognition Comments: Required instructions for rollator use multiple times (brakes, set-up, safe use.) Following commands: Impaired Following commands impaired: Follows multi-step commands inconsistently    Cueing Cueing Techniques: Verbal cues, Gestural cues  Exercises      General Comments General comments (skin  integrity, edema, etc.): Reports vertigo mostly resolved, denies reassessment/treatment with dixhallpike/epley maneuver today      Pertinent Vitals/Pain Pain Assessment Pain Assessment:  No/denies pain    Home Living                          Prior Function            PT Goals (current goals can now be found in the care plan section) Acute Rehab PT Goals Patient Stated Goal: Be able to walk smoother PT Goal Formulation: With patient Time For Goal Achievement: 03/05/24 Potential to Achieve Goals: Good Progress towards PT goals: Progressing toward goals    Frequency    Min 3X/week      PT Plan      Co-evaluation              AM-PAC PT 6 Clicks Mobility   Outcome Measure  Help needed turning from your back to your side while in a flat bed without using bedrails?: A Little Help needed moving from lying on your back to sitting on the side of a flat bed without using bedrails?: A Little Help needed moving to and from a bed to a chair (including a wheelchair)?: A Little Help needed standing up from a chair using your arms (e.g., wheelchair or bedside chair)?: A Little Help needed to walk in hospital room?: A Little Help needed climbing 3-5 steps with a railing? : A Little 6 Click Score: 18    End of Session Equipment Utilized During Treatment: Gait belt Activity Tolerance: Patient tolerated treatment well Patient left: in bed;with call bell/phone within reach;with bed alarm set;with family/visitor present Nurse Communication: Mobility status PT Visit Diagnosis: Unsteadiness on feet (R26.81);Muscle weakness (generalized) (M62.81);Dizziness and giddiness (R42);Other symptoms and signs involving the nervous system (R29.898)     Time: 8453-8395 PT Time Calculation (min) (ACUTE ONLY): 18 min  Charges:    $Gait Training: 8-22 mins PT General Charges $$ ACUTE PT VISIT: 1 Visit                     Leontine Roads, PT, DPT [facility]  Rehabilitation Services Physical Therapist Office: [phone] Website: [email or web address]    Leontine GORMAN Roads 02/23/2024, 4:48 PM

## 2024-02-24 LAB — CBC WITH DIFFERENTIAL/PLATELET
Abs Immature Granulocytes: 0.19 K/uL — ABNORMAL HIGH (ref 0.00–0.07)
Basophils Absolute: 0 K/uL (ref 0.0–0.1)
Basophils Relative: 0 %
Eosinophils Absolute: 0.1 K/uL (ref 0.0–0.5)
Eosinophils Relative: 1 %
HCT: 20.5 % — ABNORMAL LOW (ref 36.0–46.0)
Hemoglobin: 7 g/dL — ABNORMAL LOW (ref 12.0–15.0)
Immature Granulocytes: 1 %
Lymphocytes Relative: 18 %
Lymphs Abs: 2.6 K/uL (ref 0.7–4.0)
MCH: 33.8 pg (ref 26.0–34.0)
MCHC: 34.1 g/dL (ref 30.0–36.0)
MCV: 99 fL (ref 80.0–100.0)
Monocytes Absolute: 0.7 K/uL (ref 0.1–1.0)
Monocytes Relative: 5 %
Neutro Abs: 10.4 K/uL — ABNORMAL HIGH (ref 1.7–7.7)
Neutrophils Relative %: 75 %
Platelets: 265 K/uL (ref 150–400)
RBC: 2.07 MIL/uL — ABNORMAL LOW (ref 3.87–5.11)
RDW: 15.2 % (ref 11.5–15.5)
WBC: 14 K/uL — ABNORMAL HIGH (ref 4.0–10.5)
nRBC: 1.6 % — ABNORMAL HIGH (ref 0.0–0.2)

## 2024-02-24 LAB — RENAL FUNCTION PANEL
Albumin: 2.3 g/dL — ABNORMAL LOW (ref 3.5–5.0)
Anion gap: 11 (ref 5–15)
BUN: 15 mg/dL (ref 8–23)
CO2: 17 mmol/L — ABNORMAL LOW (ref 22–32)
Calcium: 7.9 mg/dL — ABNORMAL LOW (ref 8.9–10.3)
Chloride: 118 mmol/L — ABNORMAL HIGH (ref 98–111)
Creatinine, Ser: 1.85 mg/dL — ABNORMAL HIGH (ref 0.44–1.00)
GFR, Estimated: 29 mL/min — ABNORMAL LOW (ref >60)
Glucose, Bld: 76 mg/dL (ref 70–99)
Phosphorus: 2.3 mg/dL — ABNORMAL LOW (ref 2.5–4.6)
Potassium: 3.4 mmol/L — ABNORMAL LOW (ref 3.5–5.1)
Sodium: 146 mmol/L — ABNORMAL HIGH (ref 135–145)

## 2024-02-24 MED ORDER — POTASSIUM & SODIUM PHOSPHATES 280-160-250 MG PO PACK
2.0000 | PACK | ORAL | Status: AC
  Administered 2024-02-24 (×3): 2 via ORAL
  Filled 2024-02-24 (×3): qty 2

## 2024-02-24 MED ORDER — LOPERAMIDE HCL 2 MG PO CAPS
4.0000 mg | ORAL_CAPSULE | Freq: Two times a day (BID) | ORAL | Status: DC
  Administered 2024-02-24 – 2024-02-25 (×3): 4 mg via ORAL
  Filled 2024-02-24 (×3): qty 2

## 2024-02-24 MED ORDER — POTASSIUM CHLORIDE CRYS ER 20 MEQ PO TBCR
60.0000 meq | EXTENDED_RELEASE_TABLET | Freq: Once | ORAL | Status: AC
  Administered 2024-02-24: 60 meq via ORAL
  Filled 2024-02-24: qty 3

## 2024-02-24 NOTE — Plan of Care (Signed)
   Problem: Health Behavior/Discharge Planning: Goal: Ability to manage health-related needs will improve Outcome: Progressing

## 2024-02-24 NOTE — Progress Notes (Signed)
 [facility] Progress Note    Assessment/ Plan:   AKI on CKD 4 -followed by Dr. Norine. Suspect AKI is secondary to prerenal injury, PNA, UTI. No obstruction on CT but does have nephrolithiasis -baseline Cr around low 2's. Peak Cr 4, down to 1.85--at around baseline. -encouraged PO hydration w/ water -Avoid nephrotoxic medications including NSAIDs and iodinated intravenous contrast exposure unless the latter is absolutely indicated.  Preferred narcotic agents for pain control are hydromorphone , fentanyl , and methadone. Morphine  should not be used. Avoid Baclofen and avoid oral sodium phosphate  and magnesium  citrate based laxatives / bowel preps. Continue strict Input and Output monitoring. Will monitor the patient closely with you and intervene or adjust therapy as indicated by changes in clinical status/labs    Hypokalemia -suspect this is related to ongoing diarrhea, should hopefully improve soon as diarrhea is apparently improving. Can't exclude RTA. Will check urine potassium to cr ratio--recommend following this (not collected still) -given her acidosis, started standing potassium citrate 20meq TID -replete Mag  and K PRN -fluids with KCL: LR 100cc/hr with 60meq Kcl. Will stop and give her a dose of KCL 60meq x 1 dose. Repeat labs later today if needed and if indicated, can r/s fluids with kcl   Acidosis -stable at 17 -secondary to AKI and diarrhea (primarily this). Lactic acidosis resolved -potassium citrate as above-continue -RTA 1 or 2 can also present with hyperchloremic acidosis, checking spot urine K:Cr ratio--follow  Hypernatremia -encouraged PO hydration, Na down to 146  Diarrhea -has been a chronic issue it seems -mgmt per primary service, expect lytes to improve as diarrhea improves -cdiff neg   Multifocal PNA -per primary service. On doxy   UTI -abx per primary service. On rocephin    Syncope, vertigo -orthostats negative 10/20 -vestibular PT    Anemia -iron replete on panel -transfuse prn for hgb <7 -hgb 7.0 -dosed ESA 10/23: aranesp 60mcg x 1   Nothing else to really add at this junction, will sign off. Will arrange for HFU appt at our office in 1-2 weeks. Please call with any questions/concerns.   Subjective:   Patient seen and examined bedside. Patient reports feeling well. She reports that her diarrhea frequency improved, she reports that she has had 3 BM's this AM since being awake but still watery. No other complaints   Objective:   BP (!) 144/71 (BP Location: Right Arm)   Pulse 75   Temp 98.5 F (36.9 C)   Resp 18   Ht 5' 2 (1.575 m)   Wt 36.7 kg   SpO2 100%   BMI 14.80 kg/m   Intake/Output Summary (Last 24 hours) at 02/24/2024 9075 Last data filed at 02/24/2024 9476 Gross per 24 hour  Intake 1405.75 ml  Output --  Net 1405.75 ml   Weight change:   Physical Exam: Gen: NAD CVS: RRR Resp: normal wob/unlabored, speaking in full sentences Abd: NT/ND Ext: no edema Neuro: awake, alert  Imaging: No results found.  Labs: BMET Recent Labs  Lab 02/21/24 0408 02/21/24 1833 02/22/24 0550 02/22/24 1159 02/23/24 0018 02/23/24 1217 02/23/24 2026 02/24/24 0431  NA 143 143 148* 141 145 143 147* 146*  K 2.5* 2.2* 2.5* 4.0 2.1* 2.2* 3.2* 3.4*  CL 119* 108 117* 113* 115* 113* 115* 118*  CO2 14* 20* 13* 17* 18* 17* 20* 17*  GLUCOSE 105* 144* 93 78 76 92 120* 76  BUN 15 12 13 12 11 11 13 15   CREATININE 2.67* 2.40* 2.44* 2.19* 2.02* 1.97* 2.02*  1.85*  CALCIUM 7.6* 7.2* 7.6* 7.1* 7.2* 7.6* 8.2* 7.9*  PHOS 1.0* 3.3 3.9 3.3 4.1 3.3  --  2.3*   CBC Recent Labs  Lab 02/21/24 1400 02/22/24 0550 02/23/24 0018 02/24/24 0431  WBC 10.7* 12.4* 11.5* 14.0*  NEUTROABS 8.0*  --   --  10.4*  HGB 7.4* 8.3* 7.2* 7.0*  HCT 20.5* 23.5* 20.2* 20.5*  MCV 94.0 95.5 95.3 99.0  PLT 343 352 294 265    Medications:     budesonide-glycopyrrolate-formoterol   2 puff Inhalation BID   vitamin B-12  1,000 mcg Oral  Daily   feeding supplement  237 mL Oral BID BM   folic acid  1 mg Oral Daily   heparin   5,000 Units Subcutaneous Q8H   mirtazapine   15 mg Oral QHS   nicotine   14 mg Transdermal Q0600   potassium citrate  20 mEq Oral TID WC   psyllium  1 packet Oral Daily      Ephriam Stank, MD [facility] 02/24/2024, 9:24 AM

## 2024-02-24 NOTE — Progress Notes (Signed)
 Mobility Specialist Progress Note:    02/24/24 1220  Mobility  Activity Ambulated with assistance  Level of Assistance Contact guard assist, steadying assist  Assistive Device None  Distance Ambulated (ft) 90 ft  Activity Response Tolerated well  Mobility Referral Yes  Mobility visit 1 Mobility  Mobility Specialist Start Time (ACUTE ONLY) 1205  Mobility Specialist Stop Time (ACUTE ONLY) 1220  Mobility Specialist Time Calculation (min) (ACUTE ONLY) 15 min   Pt received exiting BR agreeable to mobility. Pt was able to ambulate w/ no AD very well, contact guard for safety. No c/o throughout returned to room w/o fault. Left seated EOB w/ call bell and personal belongings in reach. All needs met. Bed alarm on.  Thersia Minder Mobility Specialist  Please contact vis Secure Chat or  Rehab Office [phone]

## 2024-02-24 NOTE — Progress Notes (Signed)
 HD#6 SUBJECTIVE:  Patient Summary: Whitney Parrish is a female living with a history of CKD4, hypokalemia, asthma, GERD, psoriasis, TUD, depression, and insomnia who presented with syncope and was admitted for syncopal workup, AKI, and multifocal pneumonia   Overnight Events: none  Interim History:  Saw patient at bedside today.  Says she is feeling better.  Does have some mild lower abdominal pain mostly on the right side.  Patient still has diarrhea which occurs every time she tries to urinate.  Patient denies any SOB, fevers.  Reports she is eating and drinking well.  OBJECTIVE:  Vital Signs: Vitals:   02/23/24 1212 02/23/24 1947 02/23/24 2110 02/24/24 0849  BP: 127/83  109/68 (!) 144/71  Pulse:   73 75  Resp:   18 18  Temp:   98.3 F (36.8 C) 98.5 F (36.9 C)  TempSrc:      SpO2:  98% 100% 100%  Weight:      Height:       Supplemental O2: Room Air SpO2: 100 %  Filed Weights   02/18/24 1515 02/21/24 0930 02/21/24 1021  Weight: 54.4 kg 37.1 kg 36.7 kg     Intake/Output Summary (Last 24 hours) at 02/24/2024 1207 Last data filed at 02/24/2024 0523 Gross per 24 hour  Intake 1405.75 ml  Output --  Net 1405.75 ml   Net IO Since Admission: 10,648.56 mL [02/24/24 1207]  Physical Exam: Physical Exam Cardiovascular:     Rate and Rhythm: Normal rate and regular rhythm.  Pulmonary:     Effort: Pulmonary effort is normal.     Breath sounds: Normal breath sounds.  Abdominal:     Comments: Endorses tenderness in mid to right lower quadrant  Neurological:     Mental Status: She is alert.     Patient Lines/Drains/Airways Status     Active Line/Drains/Airways     Name Placement date Placement time Site Days   Peripheral IV 02/21/24 20 G 1.88 Left;Anterior Antecubital 02/21/24  1138  Antecubital  3   Peripheral IV 02/23/24 20 G 1.88 Anterior;Left;Upper Arm 02/23/24  1052  Arm  1            Pertinent labs and imaging:      Latest Ref Rng & Units 02/24/2024     4:31 AM 02/23/2024   12:18 AM 02/22/2024    5:50 AM  CBC  WBC 4.0 - 10.5 K/uL 14.0  11.5  12.4   Hemoglobin 12.0 - 15.0 g/dL 7.0  7.2  8.3   Hematocrit 36.0 - 46.0 % 20.5  20.2  23.5   Platelets 150 - 400 K/uL 265  294  352        Latest Ref Rng & Units 02/24/2024    4:31 AM 02/23/2024    8:26 PM 02/23/2024   12:17 PM  CMP  Glucose 70 - 99 mg/dL 76  879  92   BUN 8 - 23 mg/dL 15  13  11    Creatinine 0.44 - 1.00 mg/dL 8.14  7.97  8.02   Sodium 135 - 145 mmol/L 146  147  143   Potassium 3.5 - 5.1 mmol/L 3.4  3.2  2.2   Chloride 98 - 111 mmol/L 118  115  113   CO2 22 - 32 mmol/L 17  20  17    Calcium 8.9 - 10.3 mg/dL 7.9  8.2  7.6     No results found.  ASSESSMENT/PLAN:  Assessment: Principal Problem:   Bronchopneumonia Active Problems:  Protein-calorie malnutrition, severe   Acute renal failure   Syncope   Diarrhea   Hypokalemia   Plan:  Hypokalemia AKI on CKD4 Acidosis  Potassium levels have improved and are currently around 3.4.  Bicarb levels stable at 17.  Nephrology stopped LR, continued her p.o. potassium citrate and gave her a dose of KCl.  Will continue to monitor her potassium levels and replete if levels are too low.  Diarrhea likely causing her hypokalemia however RTA cannot be excluded.  Will try to control her diarrhea with Imodium.  Patient had low phosphate levels today which were also repleted.  Nephrology ordered K to CR ratio of urine, however, RN unable to collect good sample as it is always contaminated with stool.  RN reported straight cath to collect urine sample.  However with her recent UTI, we will defer straight cath for now.   - Continue potassium citrate 20 mEq p.o. 3 times daily - Nephrology discontinued KCl: LR 100 cc/h with 60 mEq of Kcl - Received a dose of KCl 60 mEq - Monitor electrolyte levels with RFP every 12 hours - Nephrology signed off, appreciate recommendations - Strict I and O - Repleted phosphate. - Will get  K to CR  ratio if hypokalemia is not improved  AKI on CKD 4 Creatinine levels continue to improve and at 1.85 which is around her baseline.  Likely 2/2 to prerenal injury due to poor p.o. intake. Discussed with patient importance of adequate oral intake and hydration. -Encouraged adequate p.o. hydration with water   Chronic diarrhea, improved Based on patient's history and charting, patient's diarrhea seems to be slowing down.  Will place daily order of Imodium along with as needed order.  - Imodium daily and as needed, fiber daily, lactose-free diet/supplements - RN asked to document frequency and amount of stool during each BM   Normocytic anemia Started oral vitamin B12 supplementation   - Continue folic acid tablet 1 mg p.o. daily - Continue Vitamin B12 1000 mcg oral supplementation   Severe protein-calorie malnutrition Discussed with her the importance of increasing p.o. intake.  Patient stated understanding. -Normal diet p.o. -On feeding supplement   UTI-resolved Suprapubic pain resolved.  Completed ceftriaxone  on 02/23/2024. - Discontinue IV ceftriaxone  2 g (start date: 02/19/2024)  Multifocal PNA-resolved Pt was feeling better today.  Last day of ABX yesterday 02/23/2024     Poor historian  -On delirium precautions   Enlarged thyroid  gland on imaging   - OP ultrasound follow-up   Chronic conditions:   Psoriasis Sees dermatologist Dr. Lynnell. -f/u OP   Asthma -Continue Breztri daily   TUD - Patch daily  Best Practice: Diet: Regular diet-lactose-free ensures IVF: None VTE: heparin  injection 5,000 Units Start: 02/18/24 2200 Code: Full   Disposition planning: Therapy Recs: None, DME: none Family Contact: boyfriend, at bedside. DISPO: Anticipated discharge pending clinical discharge  Signature:  Rebecka Edgardo [facility]  12:07 PM, 02/24/2024  On Call pager [phone]

## 2024-02-25 ENCOUNTER — Other Ambulatory Visit (HOSPITAL_COMMUNITY): Payer: Self-pay

## 2024-02-25 DIAGNOSIS — F17200 Nicotine dependence, unspecified, uncomplicated: Secondary | ICD-10-CM

## 2024-02-25 DIAGNOSIS — N184 Chronic kidney disease, stage 4 (severe): Secondary | ICD-10-CM | POA: Diagnosis not present

## 2024-02-25 DIAGNOSIS — N179 Acute kidney failure, unspecified: Secondary | ICD-10-CM | POA: Diagnosis not present

## 2024-02-25 DIAGNOSIS — J188 Other pneumonia, unspecified organism: Secondary | ICD-10-CM | POA: Diagnosis not present

## 2024-02-25 LAB — CBC WITH DIFFERENTIAL/PLATELET
Abs Immature Granulocytes: 0.29 K/uL — ABNORMAL HIGH (ref 0.00–0.07)
Basophils Absolute: 0.1 K/uL (ref 0.0–0.1)
Basophils Relative: 0 %
Eosinophils Absolute: 0.1 K/uL (ref 0.0–0.5)
Eosinophils Relative: 1 %
HCT: 20.8 % — ABNORMAL LOW (ref 36.0–46.0)
Hemoglobin: 7.5 g/dL — ABNORMAL LOW (ref 12.0–15.0)
Immature Granulocytes: 2 %
Lymphocytes Relative: 21 %
Lymphs Abs: 2.9 K/uL (ref 0.7–4.0)
MCH: 34.7 pg — ABNORMAL HIGH (ref 26.0–34.0)
MCHC: 36.1 g/dL — ABNORMAL HIGH (ref 30.0–36.0)
MCV: 96.3 fL (ref 80.0–100.0)
Monocytes Absolute: 0.6 K/uL (ref 0.1–1.0)
Monocytes Relative: 5 %
Neutro Abs: 9.7 K/uL — ABNORMAL HIGH (ref 1.7–7.7)
Neutrophils Relative %: 71 %
Platelets: 279 K/uL (ref 150–400)
RBC: 2.16 MIL/uL — ABNORMAL LOW (ref 3.87–5.11)
RDW: 15.1 % (ref 11.5–15.5)
WBC: 13.7 K/uL — ABNORMAL HIGH (ref 4.0–10.5)
nRBC: 5 % — ABNORMAL HIGH (ref 0.0–0.2)

## 2024-02-25 LAB — RENAL FUNCTION PANEL
Albumin: 2.3 g/dL — ABNORMAL LOW (ref 3.5–5.0)
Albumin: 2.4 g/dL — ABNORMAL LOW (ref 3.5–5.0)
Anion gap: 9 (ref 5–15)
Anion gap: 14 (ref 5–15)
BUN: 17 mg/dL (ref 8–23)
BUN: 17 mg/dL (ref 8–23)
CO2: 20 mmol/L — ABNORMAL LOW (ref 22–32)
CO2: 16 mmol/L — ABNORMAL LOW (ref 22–32)
Calcium: 8.4 mg/dL — ABNORMAL LOW (ref 8.9–10.3)
Calcium: 8.2 mg/dL — ABNORMAL LOW (ref 8.9–10.3)
Chloride: 114 mmol/L — ABNORMAL HIGH (ref 98–111)
Chloride: 111 mmol/L (ref 98–111)
Creatinine, Ser: 1.97 mg/dL — ABNORMAL HIGH (ref 0.44–1.00)
Creatinine, Ser: 1.96 mg/dL — ABNORMAL HIGH (ref 0.44–1.00)
GFR, Estimated: 27 mL/min — ABNORMAL LOW (ref >60)
GFR, Estimated: 27 mL/min — ABNORMAL LOW (ref >60)
Glucose, Bld: 80 mg/dL (ref 70–99)
Glucose, Bld: 86 mg/dL (ref 70–99)
Phosphorus: 3.2 mg/dL (ref 2.5–4.6)
Phosphorus: 2.9 mg/dL (ref 2.5–4.6)
Potassium: 3.9 mmol/L (ref 3.5–5.1)
Potassium: 3.6 mmol/L (ref 3.5–5.1)
Sodium: 143 mmol/L (ref 135–145)
Sodium: 141 mmol/L (ref 135–145)

## 2024-02-25 LAB — VITAMIN D 25 HYDROXY (VIT D DEFICIENCY, FRACTURES): Vit D, 25-Hydroxy: 22.55 ng/mL — ABNORMAL LOW (ref 30–100)

## 2024-02-25 MED ORDER — POTASSIUM CITRATE ER 10 MEQ (1080 MG) PO TBCR
20.0000 meq | EXTENDED_RELEASE_TABLET | Freq: Two times a day (BID) | ORAL | 0 refills | Status: DC
  Filled 2024-02-25: qty 120, 30d supply, fill #0

## 2024-02-25 MED ORDER — PSYLLIUM 95 % PO PACK
1.0000 | PACK | Freq: Every day | ORAL | 0 refills | Status: DC
  Filled 2024-02-25: qty 30, 30d supply, fill #0

## 2024-02-25 MED ORDER — CYANOCOBALAMIN 1000 MCG PO TABS
1000.0000 ug | ORAL_TABLET | Freq: Every day | ORAL | 0 refills | Status: DC
  Filled 2024-02-25: qty 30, 30d supply, fill #0

## 2024-02-25 MED ORDER — LOPERAMIDE HCL 2 MG PO CAPS
4.0000 mg | ORAL_CAPSULE | Freq: Three times a day (TID) | ORAL | Status: DC
Start: 2024-02-25 — End: 2024-02-25

## 2024-02-25 MED ORDER — NICOTINE POLACRILEX 2 MG MT GUM
2.0000 mg | CHEWING_GUM | OROMUCOSAL | 0 refills | Status: DC | PRN
  Filled 2024-02-25: qty 100, fill #0

## 2024-02-25 MED ORDER — POTASSIUM CHLORIDE CRYS ER 20 MEQ PO TBCR
20.0000 meq | EXTENDED_RELEASE_TABLET | Freq: Two times a day (BID) | ORAL | 0 refills | Status: DC
  Filled 2024-02-25: qty 30, 15d supply, fill #0

## 2024-02-25 MED ORDER — SIMETHICONE 40 MG/0.6ML PO SUSP
40.0000 mg | Freq: Four times a day (QID) | ORAL | 0 refills | Status: DC | PRN
  Filled 2024-02-25: qty 30, 13d supply, fill #0

## 2024-02-25 MED ORDER — LOPERAMIDE HCL 2 MG PO CAPS
4.0000 mg | ORAL_CAPSULE | Freq: Three times a day (TID) | ORAL | 0 refills | Status: DC
  Filled 2024-02-25: qty 120, 15d supply, fill #0

## 2024-02-25 MED ORDER — FOLIC ACID 1 MG PO TABS
1.0000 mg | ORAL_TABLET | Freq: Every day | ORAL | 0 refills | Status: DC
  Filled 2024-02-25: qty 30, 30d supply, fill #0

## 2024-02-25 NOTE — Progress Notes (Signed)
 HD#7 SUBJECTIVE:  Patient Summary: Whitney Parrish is a 68 year old with a history of CKD stage IV, chronic watery diarrhea, asthma, GERD, psoriasis, tobacco use disorder, depression, and insomnia, who initially presented after a syncopal episode likely secondary to hypovolemia from chronic diarrhea and poor oral intake. She was also found to have AKI on CKD stage IV and multifocal pneumonia, for which she has completed antibiotic therapy. Her hospital course has been complicated by severe hypokalemia and metabolic acidosis in the setting of ongoing diarrhea, with nephrology following for daily electrolyte and bicarbonate repletion. She is currently being managed with scheduled Imodium, as her chronic diarrhea is thought to be the primary cause of her electrolyte abnormalities.  Overnight Events: No events overnight  Interim History: Patient was seen at the bedside, resting comfortably with her husband present. She remains clinically stable with no new concerns aside from her ongoing diarrhea. She reports that the frequency of her diarrhea has decreased, although the volume remains consistent. She acknowledges that the scheduled Imodium has been helpful in improving her symptoms.   OBJECTIVE:  Vital Signs: Vitals:   02/24/24 2003 02/24/24 2008 02/25/24 0508 02/25/24 0725  BP: (!) 110/91  116/60 125/77  Pulse: 62  71 (!) 44  Resp: 17  18 18   Temp: 98.6 F (37 C)  98.7 F (37.1 C) 98.4 F (36.9 C)  TempSrc: Oral  Oral   SpO2: 94% 95% 100% (!) 83%  Weight:      Height:       Supplemental O2: Room Air SpO2: (!) 83 %  Filed Weights   02/18/24 1515 02/21/24 0930 02/21/24 1021  Weight: 54.4 kg 37.1 kg 36.7 kg     Intake/Output Summary (Last 24 hours) at 02/25/2024 0758 Last data filed at 02/25/2024 0200 Gross per 24 hour  Intake 600 ml  Output 0 ml  Net 600 ml   Net IO Since Admission: 11,248.56 mL [02/25/24 0758]  Physical Exam: Patient was lying in bed, in no acute  distress, breathing comfortably on room air. Cardiopulmonary exam is unremarkable. Abdomen notable for mild suprapubic tenderness, unchanged from prior, without guarding or rebound. Skin is warm, dry, and free of rashes.   Patient Lines/Drains/Airways Status     Active Line/Drains/Airways     Name Placement date Placement time Site Days   Peripheral IV 02/21/24 20 G 1.88 Left;Anterior Antecubital 02/21/24  1138  Antecubital  4   Peripheral IV 02/23/24 20 G 1.88 Anterior;Left;Upper Arm 02/23/24  1052  Arm  2             ASSESSMENT/PLAN:  Assessment: Principal Problem:   Bronchopneumonia Active Problems:   Protein-calorie malnutrition, severe   Acute renal failure   Syncope   Diarrhea   Hypokalemia   Plan: # Acute kidney injury on CKD 4 #Hypokalemia #Metabolic acidosis #Chronic diarrhea Metabolic derangements are likely secondary to chronic diarrhea. I appreciate nephrology's thorough involvement in her care. I remain concerned given the chronicity of her diarrhea and the persistence of hypokalemia despite adequate repletion although her K has remained WNL for the past 24hrs. Ongoing potassium losses raise concern for potential muscle weakness and cardiac arrhythmias after discharge. Fortunately, her potassium levels have stabilized with Imodium, suggesting a gastrointestinal rather than renal etiology (less likely renal tubular acidosis). The patient is currently stable. Will continue scheduled Imodium to control diarrhea. Anticipate possible discharge today or tomorrow if stability maintained while off IV potassium. Agree with checking pancreatic elastase to further evaluate chronic diarrhea. Upon  discharge, the patient will need close outpatient follow-up BMP  Plan : - Continue daily BMP to monitor electrolytes and acid-base status. - Will increase Imodium to 4 mg TID  for diarrhea control. - Will stop IV potassium and trial her on oral potassium now that her diarrhea is  better controled - If her K remains stable ,consider discharging home with Nephrology follow up  within a week of discharge. Will re-consult nephrology if hypokalemia persists  - Encourage adequate oral intake. - Monitor for signs of muscle weakness or arrhythmia. - Follow up on pancreatic elastase to evaluate for malabsorption or pancreatic insufficiency. - Consider outpatient GI follow-up for further workup if diarrhea persist  # Multifocal pneumonia - Finished course of antibiotics - No acute concerns at this time  # UTI - Resolved s/p ceftriaxone    #Anemia - Hemoglobin stable at 7.5 today - Maintain hemoglobin goal greater than 7 - Trend hemoglobin and transfuse if clinically indicated   Best Practice: Diet: Regular diet IVF: Fluids: None, Rate: None VTE: heparin  injection 5,000 Units Start: 02/18/24 2200 Code: Full AB: None at this time  Therapy Recs: Pending, DME: none Family Contact: Husband , at bedside. DISPO: Anticipated discharge tomorrow to Home pending continued medical workup.  Signature: Drue Grow , MD Internal Medicine Resident, PGY-1 [facility]  Pager: [phone] 7:58 AM, 02/25/2024   Please contact the on call pager after 5 pm and on weekends at [phone].

## 2024-02-25 NOTE — Discharge Summary (Addendum)
 Name: Whitney Parrish MRN: [id number] DOB: 1955-05-25 68 y.o. PCP: Claudene Pacific, MD  Date of Admission: 02/18/2024  3:07 PM Date of Discharge: 02/25/2024 Attending Physician: Dr. Shawn   Discharge Diagnosis: Principal Problem:   Bronchopneumonia Active Problems:   Protein-calorie malnutrition, severe   Acute renal failure   Syncope   Diarrhea   Hypokalemia    Discharge Medications: Allergies as of 02/25/2024       Reactions   Sulfa Antibiotics Other (See Comments)   hallucinations        Medication List     PAUSE taking these medications    dicyclomine  10 MG capsule Wait to take this until your doctor or other care provider tells you to start again. Commonly known as: BENTYL  Take 10 mg by mouth in the morning and at bedtime.   meclizine  25 MG tablet Wait to take this until your doctor or other care provider tells you to start again. Commonly known as: ANTIVERT  Take 25 mg by mouth 3 (three) times daily as needed for dizziness.   QUEtiapine  25 MG tablet Wait to take this until your doctor or other care provider tells you to start again. Commonly known as: SEROQUEL  Take 25 mg by mouth at bedtime.       TAKE these medications    albuterol  108 (90 Base) MCG/ACT inhaler Commonly known as: VENTOLIN  HFA Inhale 2 puffs into the lungs 3 (three) times daily.   augmented betamethasone dipropionate 0.05 % ointment Commonly known as: DIPROLENE-AF SMARTSIG:sparingly Topical Twice Daily PRN   cyanocobalamin 1000 MCG tablet Take 1 tablet (1,000 mcg total) by mouth daily. Start taking on: February 26, 2024   Dulera  200-5 MCG/ACT Aero Generic drug: mometasone -formoterol  Inhale 1 puff into the lungs daily.   feeding supplement Liqd Take 237 mLs by mouth daily at 6 (six) AM. What changed: when to take this   folic acid 1 MG tablet Commonly known as: FOLVITE Take 1 tablet (1 mg total) by mouth daily. Start taking on: February 26, 2024   gabapentin  300 MG  capsule Commonly known as: NEURONTIN  Take 300 mg by mouth at bedtime.   guselkumab  100 MG/ML pen Commonly known as: TREMFYA  Inject 100 mg into the skin See admin instructions. Every 8 weeks  Please resume only after talking with your primary care MD   loperamide 2 MG capsule Commonly known as: IMODIUM Take 2 capsules (4 mg total) by mouth 4 (four) times daily -  before meals and at bedtime.   mirtazapine  7.5 MG tablet Commonly known as: REMERON  Take 7.5 mg by mouth daily.   nicotine  polacrilex 2 MG gum Commonly known as: NICORETTE Take 1 each (2 mg total) by mouth as needed for smoking cessation.   potassium chloride  SA 20 MEQ tablet Commonly known as: KLOR-CON  M Take 1 tablet (20 mEq total) by mouth 2 (two) times daily.   psyllium 95 % Pack Commonly known as: HYDROCIL/METAMUCIL Take 1 packet by mouth daily. Start taking on: February 26, 2024   simethicone 40 MG/0.6ML drops Commonly known as: MYLICON Take 0.6 mLs (40 mg total) by mouth 4 (four) times daily as needed for flatulence.        Disposition and follow-up:   WhitneyWhitney Parrish was discharged from [facility] in Stable condition.  At the hospital follow up visit please address:  1. Follow-up: a. Ms. Whitney Parrish continues to have hypokalemia in the context of chronic diarrhea. Please repeat a BMP and CBC to monitor electrolyte  levels and replete as clinically indicated.  b. Mirtazapine  was restarted to help improve appetite and sleep, as poor intake contributed to her prior prerenal AKI. Continue to monitor her appetite and provide nutritional supplementation as needed.   2.  Labs / imaging needed at time of follow-up: CMP, CBC  3.  Pending labs/ test needing follow-up: Pancreatic fecal elastase  4.  Medication Changes    Potassium chloride  20 mg 2 times daily    Imodium as needed for diarrhea  Follow-up Appointments:  Follow-up Information     [facility] Follow up.   Specialty: Rehabilitation Why: Call to schedule appointments. Contact information: [address]. [facility] [place]  [address] [phone]        Whitney Parrish LABOR, MD Follow up.   Specialty: Nephrology Why: Please call to schedule Contact information: [address] [place] [address] [phone]                 Hospital Course by problem list: # Acute kidney injury on CKD 4 # Syncope #Hypokalemia #Metabolic acidosis #Chronic diarrhea Whitney Parrish initially presented to the [facility] Emergency Department with a reported syncopal episode. A comprehensive cardiac and neurological evaluation was unrevealing. The syncopal event was suspected to be secondary to chronic diarrhea and poor oral intake. She was found to have an acute kidney injury, likely prerenal in nature, attributed to dehydration from ongoing gastrointestinal losses.  Vestibular PT saw and assess for vertigo however orthostatics were negative.  Meclizine  was not given during this admission  Laboratory studies were notable for leukocytosis and mild lactic acidosis, though the patient remained afebrile. A follow-up chest X-ray demonstrated findings consistent with multifocal pneumonia (see plan below). Further evaluation of her chronic diarrhea included testing for *C. difficile* and a gastrointestinal pathogen panel, both of which were negative. 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 Her symptoms improved with scheduled loperamide (Imodium). Hypokalemia was managed with potassium citrate and bicarbonate supplementation in the context of metabolic acidosis. Nephrology was consulted for further recommendations.  By the time of discharge, her diarrhea had significantly improved, with episodes decreasing from 8-10 per day to approximately 3-4 per day. Her serum potassium remained within normal limits for more  than 24 hours. She was discharged home on potassium chloride  20 mEq twice daily and loperamide as needed for symptomatic control. Follow-up was arranged with [facility] for repeat BMP monitoring and further management of hypokalemia if it persists post-discharge.    # Multifocal pneumonia Chest X-ray revealed multifocal pneumonia with evidence of a cavitary lesion. The respiratory viral panel was negative. The patient was treated for atypical pneumonia with ceftriaxone  and doxycycline . Azithromycin  was avoided due to QTc prolongation noted on EKG. all symptoms resolved prior to discharge  # UTI - Resolved s/p ceftriaxone     # Tobacco use disorder Patient was given nicotine  patch and gum to reduce cravings during this hospitalization.  Discharge Subjective: Patient was seen at the bedside, resting comfortably with her husband present. She remains clinically stable with no new concerns aside from her ongoing diarrhea. She reports that the frequency of her diarrhea has decreased, although the volume remains consistent. She acknowledges that the scheduled Imodium has been helpful in improving her symptoms.   Discharge Exam:   BP 125/77 (BP Location: Right Arm)   Pulse 62   Temp 98.4 F (36.9 C)  Resp 18   Ht 5' 2 (1.575 m)   Wt 36.7 kg   SpO2 98%   BMI 14.80 kg/m  Constitutional: well-appearing woman, laying in bed in no acute distress in no acute distress HENT: normocephalic atraumatic, mucous membranes moist Eyes: conjunctiva non-erythematous Neck: supple Cardiovascular: regular rate and rhythm, no m/r/g Pulmonary/Chest: normal work of breathing on room air, lungs clear to auscultation bilaterally Abdominal: Abdominal soft and nontender, mild suprapubic tenderness that was present during admission MSK: normal bulk and tone Neurological: alert & oriented x 3, 5/5 strength in bilateral upper and lower extremities, normal gait Skin: warm and dry  Pertinent Labs,  Studies, and Procedures:     Latest Ref Rng & Units 02/25/2024    4:33 AM 02/24/2024    4:31 AM 02/23/2024   12:18 AM  CBC  WBC 4.0 - 10.5 K/uL 13.7  14.0  11.5   Hemoglobin 12.0 - 15.0 g/dL 7.5  7.0  7.2   Hematocrit 36.0 - 46.0 % 20.8  20.5  20.2   Platelets 150 - 400 K/uL 279  265  294        Latest Ref Rng & Units 02/25/2024    7:09 AM 02/25/2024    4:33 AM 02/24/2024    4:31 AM  CMP  Glucose 70 - 99 mg/dL 86  80  76   BUN 8 - 23 mg/dL 17  17  15    Creatinine 0.44 - 1.00 mg/dL 8.03  8.02  8.14   Sodium 135 - 145 mmol/L 141  143  146   Potassium 3.5 - 5.1 mmol/L 3.6  3.9  3.4   Chloride 98 - 111 mmol/L 111  114  118   CO2 22 - 32 mmol/L 16  20  17    Calcium 8.9 - 10.3 mg/dL 8.2  8.4  7.9     CT CHEST ABDOMEN PELVIS WO CONTRAST Result Date: 02/18/2024 EXAM: CT CHEST, ABDOMEN AND PELVIS WITHOUT CONTRAST 02/18/2024 07:12:41 PM TECHNIQUE: CT of the chest, abdomen and pelvis was performed without the administration of intravenous contrast. Multiplanar reformatted images are provided for review. Automated exposure control, iterative reconstruction, and/or weight based adjustment of the mA/kV was utilized to reduce the radiation dose to as low as reasonably achievable. COMPARISON: None available. CLINICAL HISTORY: emesis, cp, abd pain. FINDINGS: CHEST: MEDIASTINUM AND LYMPH NODES: Heart and pericardium are unremarkable. Coronary artery calcifications. The central airways are clear. No gross hilar adenopathy with limited evaluation on this noncontrast study. No mediastinal or axillary lymphadenopathy. LUNGS AND PLEURA: Right lower and upper lobe patchy peribronchovascular nodular-like consolidations with question central cavitations along a couple of the nodules of the right upper lobe . Associated centrilobular nodules along the right lower lobe with associated mucus plugging and mild bronchial wall thickening. No pleural effusion or pneumothorax. ABDOMEN AND PELVIS: LIVER: The liver is  unremarkable. GALLBLADDER AND BILE DUCTS: Gallbladder is unremarkable. No biliary ductal dilatation. SPLEEN: No acute abnormality. PANCREAS: No acute abnormality. ADRENAL GLANDS: No acute abnormality. KIDNEYS, URETERS AND BLADDER: Left nephrolithiasis measuring up to 1.2 cm. Right nephrolithiasis measuring up to 0.2cm. No ureterolithiasis bilaterally. No hydroureteronephrosis bilaterally. No perinephric or periureteral stranding. Urinary bladder is unremarkable. GI AND BOWEL: Stomach demonstrates no acute abnormality. Status post appendectomy. Grossly unremarkable colon that is underdistended. No small bowel dilatation. No definite small bowel wall thickening with limited evaluation on this noncontrast study. There is no bowel obstruction. REPRODUCTIVE ORGANS: Status post hysterectomy. No adnexal masses. PERITONEUM AND RETROPERITONEUM: No ascites. No  free air. VASCULATURE: Aorta is normal in caliber. Moderate atherosclerotic plaque. ABDOMINAL AND PELVIS LYMPH NODES: No lymphadenopathy. BONES AND SOFT TISSUES: No acute osseous abnormality. No focal soft tissue abnormality. IMPRESSION: 1. Bronchopneumonia of the right upper and lower lobes with possible developing cavitary lesion in the right upper lobe. No gross hilar adenopathy with limited evaluation on this noncontrast study. Recommend follow-up CT in 3 months to evaluate for complete resolution. 2. Nonobstructive bilateral nephrolithiasis measuring up to 12 mm on the left and 2 mm on the right. 3. Atherosclerotic plaque including coronary artery calcification. 4. Status post hysterectomy and appendectomy. Electronically signed by: Morgane Naveau MD 02/18/2024 07:27 PM EDT RP Workstation: [id number]   CT Cervical Spine Wo Contrast Result Date: 02/18/2024 EXAM: CT CERVICAL SPINE WITHOUT CONTRAST 02/18/2024 07:12:41 PM TECHNIQUE: CT of the cervical spine was performed without the administration of intravenous contrast. Multiplanar reformatted images are provided  for review. Automated exposure control, iterative reconstruction, and/or weight based adjustment of the mA/kV was utilized to reduce the radiation dose to as low as reasonably achievable. COMPARISON: None available. CLINICAL HISTORY: Neck trauma, midline tenderness (Age 57-64y). FINDINGS: CERVICAL SPINE: BONES AND ALIGNMENT: No acute fracture or traumatic malalignment. DEGENERATIVE CHANGES: Multilevel moderate degenerative changes of the spine most prominent at the C5-C6 levels. No associated severe osseous neural foraminal or central canal stenosis. SOFT TISSUES: No prevertebral soft tissue swelling. Heterogeneous enlarged left thyroid  gland with underlying nodules that are difficult to measure but likely measuring up to 1.6 cm. LUNGS: Trace centrilobular and paraseptal and sinus changes. Biapical pleural and pulmonary scarring. IMPRESSION: 1. No acute abnormality of the cervical spine related to the reported neck trauma. 2. Heterogeneous enlarged left thyroid  gland with nodules measuring up to approximately 1.6 cm. Recommend outpatient thyroid  gland ultrasound for further evaluation. 3. Emphysema. Electronically signed by: Morgane Naveau MD 02/18/2024 07:19 PM EDT RP Workstation: [id number]   CT Head Wo Contrast Result Date: 02/18/2024 EXAM: CT HEAD WITHOUT CONTRAST 02/18/2024 07:12:41 PM TECHNIQUE: CT of the head was performed without the administration of intravenous contrast. Automated exposure control, iterative reconstruction, and/or weight based adjustment of the mA/kV was utilized to reduce the radiation dose to as low as reasonably achievable. COMPARISON: None available. CLINICAL HISTORY: Head trauma, moderate-severe; fall, syncope. FINDINGS: BRAIN AND VENTRICLES: Patchy and confluent areas of decreased attenuation are noted throughout the deep and periventricular white matter of the cerebral hemispheres bilaterally suggestive of chronic microvascular ischemic changes. No acute hemorrhage. No evidence  of acute infarct. No hydrocephalus. No extra-axial collection. No mass effect or midline shift. ORBITS: Bilateral lens replacement. No acute abnormality. SINUSES: Right maxillary sinus mucosal thickening. SOFT TISSUES AND SKULL: No acute soft tissue abnormality. No skull fracture. IMPRESSION: 1. No acute intracranial abnormality. Electronically signed by: Morgane Naveau MD 02/18/2024 07:16 PM EDT RP Workstation: [id number]   DG Tibia/Fibula Right Result Date: 02/18/2024 CLINICAL DATA:  Clemens, pain EXAM: RIGHT TIBIA AND FIBULA - 2 VIEW COMPARISON:  11/20/2019 FINDINGS: Frontal and lateral views of the right tibia and fibula are obtained. There are no acute displaced fractures. Alignment of the right knee and ankle is anatomic. Soft tissues are normal. IMPRESSION: 1. No acute displaced fracture. Electronically Signed   By: Ozell Daring M.D.   On: 02/18/2024 16:18   DG Pelvis 1-2 Views Result Date: 02/18/2024 CLINICAL DATA:  Clemens, weakness EXAM: PELVIS - 1-2 VIEW COMPARISON:  02/01/2018 FINDINGS: Supine frontal view of the pelvis includes both hips. No acute displaced fracture, subluxation, or dislocation. Joint spaces  are well preserved. Sacroiliac joints are unremarkable. IMPRESSION: 1. No acute displaced fracture. Electronically Signed   By: Ozell Daring M.D.   On: 02/18/2024 16:17   DG Chest Portable 1 View Result Date: 02/18/2024 CLINICAL DATA:  Dizziness, fell, decreased appetite EXAM: PORTABLE CHEST 1 VIEW COMPARISON:  11/03/2022 FINDINGS: Single frontal view of the chest demonstrates an unremarkable cardiac silhouette. Scattered nodular opacities are seen within the right mid and lower lung zones, which could be inflammatory or infectious. Close follow-up is recommended to assess for resolution and exclude underlying pulmonary nodules. No effusion or pneumothorax. No acute bony abnormalities. IMPRESSION: 1. Patchy nodular consolidation within the right mid and lower lung zones, which could be  inflammatory or infectious. Followup PA and lateral chest X-ray is recommended in 3-4 weeks following trial of antibiotic therapy to ensure resolution and exclude underlying parenchymal lung nodules. Electronically Signed   By: Ozell Daring M.D.   On: 02/18/2024 16:15     Discharge Instructions: Discharge Instructions     Ambulatory referral to Occupational Therapy   Complete by: As directed    Ambulatory referral to Physical Therapy   Complete by: As directed    Diet - low sodium heart healthy   Complete by: As directed    Discharge instructions   Complete by: As directed    Dear Ms Boice,  It was a pleasure taking care of you at [facility]. You were admitted for syncope and treated for pneumonia and acute kidney injury. We are discharging you home now that you are doing better. Your kidney doctor will call you to schedule an appointment for next week. If you dont hear from them by next week, please contact :  [employer]. [address], [place] [address]. [phone]  Please follow the following instructions.  1. Take your medications as prescribed: You are prescribed potassium chloride  20 mg twice daily, take them exactly as directed. Do not take extra potassium or over-the-counter supplements unless told by your doctor. 2 . Follow-up testing: You will need repeat blood work to check your potassium level. Keep your follow-up appointment with your primary care provider or specialist as scheduled. 3. Watch for warning signs and seek medical attention if you notice: - Muscle weakness or cramping - Irregular or fast heartbeat - Chest pain  -Severe fatigue or lightheadedness - Nausea, vomiting, or numbness/tingling  Your diarrhea was treated with Imodium and I am sending you home with 30-day supply.  Please take this as discussed. If your diarrhea gets better, your kidney doctor or your primary care physician may make changes to this medication.  You  have also been prescribed mirtazapine .  This will help with your appetite and also help you sleep better.  Please take this as discussed   Take care,  Dr. Drue Grow, MD   Increase activity slowly   Complete by: As directed        Signed: Grow Drue, MD 02/25/2024, 2:30 PM

## 2024-02-25 NOTE — Progress Notes (Signed)
 RN went over DC instructions with patient and she stated understanding IV has been removed and belongings packed. TOC medications have been given to patient.

## 2024-02-25 NOTE — Progress Notes (Signed)
 Mobility Specialist Progress Note:    02/25/24 1151  Mobility  Activity Ambulated with assistance (From BR to Bed)  Level of Assistance Contact guard assist, steadying assist  Assistive Device None  Distance Ambulated (ft) 10 ft  Activity Response Tolerated well  Mobility Referral Yes  Mobility visit 1 Mobility  Mobility Specialist Start Time (ACUTE ONLY) 1003  Mobility Specialist Stop Time (ACUTE ONLY) 1013  Mobility Specialist Time Calculation (min) (ACUTE ONLY) 10 min   Received pt in BR and assisted back to bed. Further mobility deferred d/t fatigue. Left pt in bed. Personal belongings and call light within reach. All needs met.  Lavanda Pollack Mobility Specialist  Please contact via [employer] or  Rehab Office [phone]

## 2024-02-27 ENCOUNTER — Other Ambulatory Visit (HOSPITAL_COMMUNITY): Payer: Self-pay

## 2024-02-29 LAB — VITAMIN E
Vitamin E (Alpha Tocopherol): 14.9 mg/L (ref 9.0–29.0)
Vitamin E(Gamma Tocopherol): 0.9 mg/L (ref 0.5–4.9)

## 2024-02-29 LAB — VITAMIN A: Vitamin A (Retinoic Acid): 7.4 ug/dL — ABNORMAL LOW (ref 22.0–69.5)

## 2024-03-02 ENCOUNTER — Other Ambulatory Visit (HOSPITAL_COMMUNITY): Payer: Self-pay | Admitting: Internal Medicine

## 2024-03-02 DIAGNOSIS — N183 Chronic kidney disease, stage 3 unspecified: Secondary | ICD-10-CM | POA: Insufficient documentation

## 2024-03-02 LAB — VITAMIN K1, SERUM: VITAMIN K1: 0.17 ng/mL (ref 0.10–2.20)

## 2024-03-05 ENCOUNTER — Telehealth (HOSPITAL_COMMUNITY): Payer: Self-pay | Admitting: Pharmacy Technician

## 2024-03-05 NOTE — Telephone Encounter (Signed)
 Auth Submission: NO AUTH NEEDED Site of care: [facility] Payer: [employer] DUAL MEDICARE Medication & CPT/J Code(s) submitted: Q5106 - PR INJ RETACRIT NON-ESRD USE Diagnosis Code: N18.30, D63.1 Route of submission (phone, fax, portal):  Phone # Fax # Auth type: Buy/Bill HB Units/visits requested: 20000 UNITS Q 4 WEEKS Reference number: [id number] Approval from: 03/05/2024 to 06/02/24    Dagoberto Armour, CPhT [facility] Phone: [phone] 03/05/2024

## 2024-03-07 ENCOUNTER — Encounter: Payer: Self-pay | Admitting: Adult Health

## 2024-03-07 ENCOUNTER — Ambulatory Visit: Payer: Self-pay | Admitting: Adult Health

## 2024-03-07 VITALS — BP 110/62 | HR 72 | Temp 97.1°F | Ht 60.63 in | Wt 72.8 lb

## 2024-03-07 DIAGNOSIS — F17200 Nicotine dependence, unspecified, uncomplicated: Secondary | ICD-10-CM | POA: Diagnosis not present

## 2024-03-07 DIAGNOSIS — Z113 Encounter for screening for infections with a predominantly sexual mode of transmission: Secondary | ICD-10-CM

## 2024-03-07 DIAGNOSIS — E876 Hypokalemia: Secondary | ICD-10-CM

## 2024-03-07 DIAGNOSIS — R42 Dizziness and giddiness: Secondary | ICD-10-CM

## 2024-03-07 DIAGNOSIS — J180 Bronchopneumonia, unspecified organism: Secondary | ICD-10-CM | POA: Diagnosis not present

## 2024-03-07 DIAGNOSIS — R630 Anorexia: Secondary | ICD-10-CM | POA: Diagnosis not present

## 2024-03-07 DIAGNOSIS — G629 Polyneuropathy, unspecified: Secondary | ICD-10-CM

## 2024-03-07 DIAGNOSIS — Z23 Encounter for immunization: Secondary | ICD-10-CM

## 2024-03-07 DIAGNOSIS — Z7689 Persons encountering health services in other specified circumstances: Secondary | ICD-10-CM

## 2024-03-07 DIAGNOSIS — Z1231 Encounter for screening mammogram for malignant neoplasm of breast: Secondary | ICD-10-CM

## 2024-03-07 DIAGNOSIS — B351 Tinea unguium: Secondary | ICD-10-CM

## 2024-03-07 DIAGNOSIS — N179 Acute kidney failure, unspecified: Secondary | ICD-10-CM

## 2024-03-07 DIAGNOSIS — K529 Noninfective gastroenteritis and colitis, unspecified: Secondary | ICD-10-CM

## 2024-03-07 DIAGNOSIS — Z13820 Encounter for screening for osteoporosis: Secondary | ICD-10-CM

## 2024-03-07 DIAGNOSIS — E559 Vitamin D deficiency, unspecified: Secondary | ICD-10-CM

## 2024-03-07 DIAGNOSIS — N184 Chronic kidney disease, stage 4 (severe): Secondary | ICD-10-CM

## 2024-03-07 DIAGNOSIS — L400 Psoriasis vulgaris: Secondary | ICD-10-CM

## 2024-03-07 MED ORDER — DULERA 200-5 MCG/ACT IN AERO: 1.0000 | INHALATION_SPRAY | Freq: Every day | RESPIRATORY_TRACT | 3 refills | Status: AC

## 2024-03-07 MED ORDER — VITAMIN D (ERGOCALCIFEROL) 1.25 MG (50000 UNIT) PO CAPS: 50000.0000 [IU] | ORAL_CAPSULE | ORAL | 2 refills | Status: DC

## 2024-03-07 MED ORDER — NICOTINE 21 MG/24HR TD PT24: 21.0000 mg | MEDICATED_PATCH | Freq: Every day | TRANSDERMAL | 1 refills | Status: DC

## 2024-03-07 MED ORDER — ALBUTEROL SULFATE HFA 108 (90 BASE) MCG/ACT IN AERS: 2.0000 | INHALATION_SPRAY | Freq: Four times a day (QID) | RESPIRATORY_TRACT | 3 refills | Status: AC | PRN

## 2024-03-07 MED ORDER — PSYLLIUM 95 % PO PACK: 1.0000 | PACK | Freq: Every day | ORAL | 11 refills | Status: DC

## 2024-03-07 MED ORDER — BETAMETHASONE DIPROPIONATE AUG 0.05 % EX OINT: TOPICAL_OINTMENT | Freq: Two times a day (BID) | CUTANEOUS | 3 refills | Status: DC | PRN

## 2024-03-07 NOTE — Progress Notes (Signed)
 [facility] [place] [facility]  Provider:  Jereld Serum DNP  Code Status:  Full Code  Goals of Care:     02/19/2024    5:00 AM  Advanced Directives  Does Patient Have a Medical Advance Directive? No  Would patient like information on creating a medical advance directive? No - Patient declined     Chief Complaint  Patient presents with   Establish Care   Hospitalization Follow-up    10/18 - 10/25. Weakness, syncope and pneumonia    Discussed the use of AI scribe software for clinical note transcription with the patient, who gave verbal consent to proceed.  HPI: Patient is a 68 y.o. female seen today to establish care with [facility]. She is accompanied by her boyfriend, [name] Cocking.  She has been experiencing chronic diarrhea for the past three to four weeks, with episodes occurring seven to nine times a day. This began after her recent hospitalization from October 18 to February 25, 2024, for bronchopneumonia. During her hospital stay, she was treated for hypokalemia attributed to chronic diarrhea and poor oral intake. Her diarrhea had initially improved to three to four episodes per day by discharge but has since returned to eight to ten episodes per day. She is currently taking potassium 20 mEq twice daily and loperamide as needed.  She has a history of short gut surgery. She is currently taking potassium 20 mEq twice daily, mirtazapine  7.5 mg at bedtime, and Ensure for nutritional support.  She was hospitalized for bronchopneumonia and treated with ceftriaxone  and doxycycline . She smokes three to four cigarettes a day.  She has a history of plaque psoriasis and is on Tremfya  100 mg every eight weeks, administered by self-injection. She follows up with a dermatologist for this condition. She also uses betamethasone dipropionate 0.05% ointment as needed for skin rashes on her feet and legs.  She has a history of chronic kidney disease, stage four, and was found to have acute kidney injury  during her recent hospitalization. She is scheduled to follow up with a nephrologist.  She experiences vertigo and takes meclizine  25 mg three times a day as needed. She also takes gabapentin  300 mg at bedtime for neuropathy-related pain.  She has a history of vitamin D deficiency and is prescribed vitamin D 50,000 IU once a week. She has poor appetite and is encouraged to drink Ensure twice daily to help with weight gain.     Past Medical History:  Diagnosis Date   Anxiety    Arthritis    shoulders and hands   Asthma    Benign positional vertigo    Chronically dry eyes    CKD (chronic kidney disease), stage III (HCC)    Depression    Dyspnea on exertion    GERD (gastroesophageal reflux disease)    Hematuria    History of acute pyelonephritis    History of Bell's palsy    1980's left side-- residual lip numbness intermittant   History of chest pain    non-cardiac per dr claudene notes (pt's pcp)   History of panic attacks    Hypertension    Left ureteral stone    Loose bowel movements    chronic-per pt residual from injury during abdominal surgery in 1986   Migraines    Nephrolithiasis    left    Psoriasis    Urgency of urination    Wears dentures    has upper and lower but only wears upper    Past Surgical History:  Procedure Laterality Date   BREAST CYST EXCISION  12/15/2011   Procedure: CYST EXCISION BREAST;  Surgeon: Vicenta DELENA Poli, MD;  Location: [facility];  Service: General;  Laterality: Left;  Excision of a Chronic Left Breast Sebaceous Cyst   CARDIOVASCULAR STRESS TEST  12-28-2012   Low risk nuclear study w/ small LV cavity which may be due LVH/  no ischemia or infarct/  normal wall motion, ef 67%   CYSTOSCOPY WITH STENT PLACEMENT Left 09/08/2015   Procedure: CYSTOSCOPY WITH LEFT STENT PLACEMENT;  Surgeon: Norleen Seltzer, MD;  Location: [facility];  Service: Urology;  Laterality: Left;   CYSTOSCOPY WITH URETEROSCOPY AND STENT PLACEMENT Left 09/25/2015   Procedure: LEFT   URETEROSCOPY STONE EXTRACTION;  Surgeon: Norleen Seltzer, MD;  Location[facility];  Service: Urology;  Laterality: Left;   EXCISION AND REVISION SCAR ABDOMINAL WALL  01-22-2002   EXPLORATORY LAPAROTOMY  1986   w/ Abdominal Myomectomy  (per pt bowel muscle cut (injury))   HOLMIUM LASER APPLICATION Left 09/25/2015   Procedure: HOLMIUM LASER APPLICATION;  Surgeon: Norleen Seltzer, MD;  Location: [facility];  Service: Urology;  Laterality: Left;   I & D LEFT BUTTOCK ABSCESS   07-01-2010   x2 area's   NEGATIVE SLEEP STUDY  08-10-2005   SHOULDER ARTHROSCOPY WITH SUBACROMIAL DECOMPRESSION, ROTATOR CUFF REPAIR AND BICEP TENDON REPAIR Right 11/12/2021   Procedure: SHOULDER ARTHROSCOPY WITH SUBACROMIAL DECOMPRESSION, ROTATOR CUFF REPAIR AND BICEP TENDON REPAIR;  Surgeon: Cristy Bonner DASEN, MD;  Location: [facility];  Service: Orthopedics;  Laterality: Right;   TRANSTHORACIC ECHOCARDIOGRAM  12-28-2012   grade 1 diastolic dysfunction, mild concentric LVH, ef 65-70%/  mild MR/  trivial PR and TR    Allergies  Allergen Reactions   Sulfa Antibiotics Other (See Comments)    hallucinations    Outpatient Encounter Medications as of 03/07/2024  Medication Sig   cyanocobalamin 1000 MCG tablet Take 1 tablet (1,000 mcg total) by mouth daily.   feeding supplement (ENSURE ENLIVE / ENSURE PLUS) LIQD Take 237 mLs by mouth daily at 6 (six) AM. (Patient taking differently: Take 237 mLs by mouth 3 (three) times daily between meals.)   folic acid (FOLVITE) 1 MG tablet Take 1 tablet (1 mg total) by mouth daily.   gabapentin  (NEURONTIN ) 300 MG capsule Take 300 mg by mouth at bedtime.   loperamide (IMODIUM) 2 MG capsule Take 2 capsules (4 mg total) by mouth 4 (four) times daily -  before meals and at bedtime.   [Paused] meclizine  (ANTIVERT ) 25 MG tablet Take 25 mg by mouth 3 (three) times daily as needed for dizziness.   mirtazapine  (REMERON ) 7.5 MG tablet Take 7.5 mg by mouth daily.    nicotine  (NICODERM CQ  - DOSED IN MG/24 HOURS) 21 mg/24hr patch Place 1 patch (21 mg total) onto the skin daily.   potassium chloride  SA (KLOR-CON  M) 20 MEQ tablet Take 1 tablet (20 mEq total) by mouth 2 (two) times daily.   [Paused] QUEtiapine  (SEROQUEL ) 25 MG tablet Take 25 mg by mouth at bedtime.   simethicone (MYLICON) 40 MG/0.6ML drops Take 0.6 mLs (40 mg total) by mouth 4 (four) times daily as needed for flatulence.   Vitamin D, Ergocalciferol, (DRISDOL) 1.25 MG (50000 UNIT) CAPS capsule Take 1 capsule (50,000 Units total) by mouth every 7 (seven) days.   [DISCONTINUED] albuterol  (PROVENTIL  HFA;VENTOLIN  HFA) 108 (90 Base) MCG/ACT inhaler Inhale 2 puffs into the lungs 3 (three) times daily.   [DISCONTINUED] augmented betamethasone  dipropionate (DIPROLENE-AF) 0.05 % ointment SMARTSIG:sparingly Topical Twice Daily PRN   [DISCONTINUED] mometasone -formoterol  (DULERA ) 200-5 MCG/ACT AERO Inhale 1 puff into the lungs daily.   [DISCONTINUED] psyllium (HYDROCIL/METAMUCIL) 95 % PACK Take 1 packet by mouth daily.   albuterol  (VENTOLIN  HFA) 108 (90 Base) MCG/ACT inhaler Inhale 2 puffs into the lungs every 6 (six) hours as needed for wheezing or shortness of breath.   augmented betamethasone dipropionate (DIPROLENE-AF) 0.05 % ointment Apply topically 2 (two) times daily as needed.   [Paused] dicyclomine  (BENTYL ) 10 MG capsule Take 10 mg by mouth in the morning and at bedtime. (Patient not taking: Reported on 03/07/2024)   Guselkumab  100 MG/ML SOPN Inject 100 mg into the skin See admin instructions. Every 8 weeks  Please resume only after talking with your primary care MD (Patient not taking: Reported on 03/07/2024)   mometasone -formoterol  (DULERA ) 200-5 MCG/ACT AERO Inhale 1 puff into the lungs daily.   nicotine  polacrilex (NICORETTE) 2 MG gum Take 1 each (2 mg total) by mouth as needed for smoking cessation. (Patient not taking: Reported on 03/07/2024)   psyllium (HYDROCIL/METAMUCIL) 95 % PACK Take 1 packet by  mouth daily.   No facility-administered encounter medications on file as of 03/07/2024.    Review of Systems:  Review of Systems  Constitutional:  Negative for appetite change, chills, fatigue and fever.  HENT:  Negative for congestion, hearing loss, rhinorrhea and sore throat.   Eyes: Negative.   Respiratory:  Positive for cough and shortness of breath. Negative for wheezing.   Cardiovascular:  Negative for chest pain, palpitations and leg swelling.  Gastrointestinal:  Positive for diarrhea. Negative for abdominal pain, constipation, nausea and vomiting.  Genitourinary:  Negative for dysuria.  Musculoskeletal:  Negative for arthralgias, back pain and myalgias.  Skin:  Negative for color change, rash and wound.  Neurological:  Negative for dizziness, weakness and headaches.  Psychiatric/Behavioral:  Negative for behavioral problems. The patient is not nervous/anxious.     Health Maintenance  Topic Date Due   Medicare Annual Wellness (AWV)  Never done   Hepatitis C Screening  Never done   Pneumococcal Vaccine: 50+ Years (1 of 2 - PCV) Never done   Zoster Vaccines- Shingrix (1 of 2) Never done   DTaP/Tdap/Td (1 - Tdap) 08/13/1998   Colonoscopy  Never done   Mammogram  12/26/2016   DEXA SCAN  Never done   COVID-19 Vaccine (5 - 2025-26 season) 03/23/2024 (Originally 01/02/2024)   Influenza Vaccine  Completed   Meningococcal B Vaccine  Aged Out    Physical Exam: Vitals:   03/07/24 0908  BP: 110/62  Pulse: 72  Temp: (!) 97.1 F (36.2 C)  TempSrc: Temporal  SpO2: 94%  Weight: 72 lb 12.8 oz (33 kg)  Height: 5' 0.63 (1.54 m)   Body mass index is 13.92 kg/m. Physical Exam Constitutional:      General: She is not in acute distress.    Comments: cachectic  HENT:     Head: Normocephalic and atraumatic.     Nose: Nose normal.     Mouth/Throat:     Mouth: Mucous membranes are moist.  Eyes:     Conjunctiva/sclera: Conjunctivae normal.  Cardiovascular:     Rate and Rhythm:  Normal rate and regular rhythm.  Pulmonary:     Effort: Pulmonary effort is normal.     Breath sounds: Normal breath sounds.  Abdominal:     General: Bowel sounds are normal.     Palpations: Abdomen is soft.  Musculoskeletal:        General: Normal range of motion.     Cervical back: Normal range of motion.  Skin:    General: Skin is warm and dry.     Comments: Has black patches of dry skin on bilateral feet, thick long toenails  Neurological:     General: No focal deficit present.     Mental Status: She is alert and oriented to person, place, and time.  Psychiatric:        Mood and Affect: Mood normal.        Behavior: Behavior normal.        Thought Content: Thought content normal.        Judgment: Judgment normal.     Labs reviewed: Basic Metabolic Panel: Recent Labs    02/19/24 0027 02/19/24 0603 02/20/24 2100 02/21/24 0408 02/21/24 1833 02/22/24 0550 02/22/24 1159 02/24/24 0431 02/25/24 0433 02/25/24 0709  NA 141   < > 142 143   < > 148*   < > 146* 143 141  K 2.2*   < > 2.3* 2.5*   < > 2.5*   < > 3.4* 3.9 3.6  CL 115*   < > 117* 119*   < > 117*   < > 118* 114* 111  CO2 11*   < > 12* 14*   < > 13*   < > 17* 20* 16*  GLUCOSE 106*   < > 120* 105*   < > 93   < > 76 80 86  BUN 24*   < > 15 15   < > 13   < > 15 17 17   CREATININE 3.65*   < > 2.89* 2.67*   < > 2.44*   < > 1.85* 1.97* 1.96*  CALCIUM 8.1*   < > 7.9* 7.6*   < > 7.6*   < > 7.9* 8.4* 8.2*  MG  --    < > 3.1* 2.7*  --  2.1  --   --   --   --   PHOS 3.1   < >  --  1.0*   < > 3.9   < > 2.3* 3.2 2.9  TSH 1.339  --   --   --   --   --   --   --   --   --    < > = values in this interval not displayed.   Liver Function Tests: Recent Labs    02/18/24 1518 02/19/24 0027 02/24/24 0431 02/25/24 0433 02/25/24 0709  AST 20  --   --   --   --   ALT 9  --   --   --   --   ALKPHOS 78  --   --   --   --   BILITOT 1.1  --   --   --   --   PROT 7.0  --   --   --   --   ALBUMIN 2.9*   < > 2.3* 2.3* 2.4*   < > =  values in this interval not displayed.   Recent Labs    02/18/24 1534  LIPASE 53*   No results for input(s): AMMONIA in the last 8760 hours. CBC: Recent Labs    02/21/24 1400 02/22/24 0550 02/23/24 0018 02/24/24 0431 02/25/24 0433  WBC 10.7*   < > 11.5* 14.0* 13.7*  NEUTROABS 8.0*  --   --  10.4* 9.7*  HGB 7.4*   < >  7.2* 7.0* 7.5*  HCT 20.5*   < > 20.2* 20.5* 20.8*  MCV 94.0   < > 95.3 99.0 96.3  PLT 343   < > 294 265 279   < > = values in this interval not displayed.   Lipid Panel: No results for input(s): CHOL, HDL, LDLCALC, TRIG, CHOLHDL, LDLDIRECT in the last 8760 hours. No results found for: HGBA1C  Procedures since last visit: CT CHEST ABDOMEN PELVIS WO CONTRAST Result Date: 02/18/2024 EXAM: CT CHEST, ABDOMEN AND PELVIS WITHOUT CONTRAST 02/18/2024 07:12:41 PM TECHNIQUE: CT of the chest, abdomen and pelvis was performed without the administration of intravenous contrast. Multiplanar reformatted images are provided for review. Automated exposure control, iterative reconstruction, and/or weight based adjustment of the mA/kV was utilized to reduce the radiation dose to as low as reasonably achievable. COMPARISON: None available. CLINICAL HISTORY: emesis, cp, abd pain. FINDINGS: CHEST: MEDIASTINUM AND LYMPH NODES: Heart and pericardium are unremarkable. Coronary artery calcifications. The central airways are clear. No gross hilar adenopathy with limited evaluation on this noncontrast study. No mediastinal or axillary lymphadenopathy. LUNGS AND PLEURA: Right lower and upper lobe patchy peribronchovascular nodular-like consolidations with question central cavitations along a couple of the nodules of the right upper lobe . Associated centrilobular nodules along the right lower lobe with associated mucus plugging and mild bronchial wall thickening. No pleural effusion or pneumothorax. ABDOMEN AND PELVIS: LIVER: The liver is unremarkable. GALLBLADDER AND BILE DUCTS:  Gallbladder is unremarkable. No biliary ductal dilatation. SPLEEN: No acute abnormality. PANCREAS: No acute abnormality. ADRENAL GLANDS: No acute abnormality. KIDNEYS, URETERS AND BLADDER: Left nephrolithiasis measuring up to 1.2 cm. Right nephrolithiasis measuring up to 0.2cm. No ureterolithiasis bilaterally. No hydroureteronephrosis bilaterally. No perinephric or periureteral stranding. Urinary bladder is unremarkable. GI AND BOWEL: Stomach demonstrates no acute abnormality. Status post appendectomy. Grossly unremarkable colon that is underdistended. No small bowel dilatation. No definite small bowel wall thickening with limited evaluation on this noncontrast study. There is no bowel obstruction. REPRODUCTIVE ORGANS: Status post hysterectomy. No adnexal masses. PERITONEUM AND RETROPERITONEUM: No ascites. No free air. VASCULATURE: Aorta is normal in caliber. Moderate atherosclerotic plaque. ABDOMINAL AND PELVIS LYMPH NODES: No lymphadenopathy. BONES AND SOFT TISSUES: No acute osseous abnormality. No focal soft tissue abnormality. IMPRESSION: 1. Bronchopneumonia of the right upper and lower lobes with possible developing cavitary lesion in the right upper lobe. No gross hilar adenopathy with limited evaluation on this noncontrast study. Recommend follow-up CT in 3 months to evaluate for complete resolution. 2. Nonobstructive bilateral nephrolithiasis measuring up to 12 mm on the left and 2 mm on the right. 3. Atherosclerotic plaque including coronary artery calcification. 4. Status post hysterectomy and appendectomy. Electronically signed by: Morgane Naveau MD 02/18/2024 07:27 PM EDT RP Workstation: [id number]   CT Cervical Spine Wo Contrast Result Date: 02/18/2024 EXAM: CT CERVICAL SPINE WITHOUT CONTRAST 02/18/2024 07:12:41 PM TECHNIQUE: CT of the cervical spine was performed without the administration of intravenous contrast. Multiplanar reformatted images are provided for review. Automated exposure control,  iterative reconstruction, and/or weight based adjustment of the mA/kV was utilized to reduce the radiation dose to as low as reasonably achievable. COMPARISON: None available. CLINICAL HISTORY: Neck trauma, midline tenderness (Age 85-64y). FINDINGS: CERVICAL SPINE: BONES AND ALIGNMENT: No acute fracture or traumatic malalignment. DEGENERATIVE CHANGES: Multilevel moderate degenerative changes of the spine most prominent at the C5-C6 levels. No associated severe osseous neural foraminal or central canal stenosis. SOFT TISSUES: No prevertebral soft tissue swelling. Heterogeneous enlarged left thyroid  gland with underlying nodules that  are difficult to measure but likely measuring up to 1.6 cm. LUNGS: Trace centrilobular and paraseptal and sinus changes. Biapical pleural and pulmonary scarring. IMPRESSION: 1. No acute abnormality of the cervical spine related to the reported neck trauma. 2. Heterogeneous enlarged left thyroid  gland with nodules measuring up to approximately 1.6 cm. Recommend outpatient thyroid  gland ultrasound for further evaluation. 3. Emphysema. Electronically signed by: Morgane Naveau MD 02/18/2024 07:19 PM EDT RP Workstation: [id number]   CT Head Wo Contrast Result Date: 02/18/2024 EXAM: CT HEAD WITHOUT CONTRAST 02/18/2024 07:12:41 PM TECHNIQUE: CT of the head was performed without the administration of intravenous contrast. Automated exposure control, iterative reconstruction, and/or weight based adjustment of the mA/kV was utilized to reduce the radiation dose to as low as reasonably achievable. COMPARISON: None available. CLINICAL HISTORY: Head trauma, moderate-severe; fall, syncope. FINDINGS: BRAIN AND VENTRICLES: Patchy and confluent areas of decreased attenuation are noted throughout the deep and periventricular white matter of the cerebral hemispheres bilaterally suggestive of chronic microvascular ischemic changes. No acute hemorrhage. No evidence of acute infarct. No hydrocephalus. No  extra-axial collection. No mass effect or midline shift. ORBITS: Bilateral lens replacement. No acute abnormality. SINUSES: Right maxillary sinus mucosal thickening. SOFT TISSUES AND SKULL: No acute soft tissue abnormality. No skull fracture. IMPRESSION: 1. No acute intracranial abnormality. Electronically signed by: Morgane Naveau MD 02/18/2024 07:16 PM EDT RP Workstation: [id number]   DG Tibia/Fibula Right Result Date: 02/18/2024 CLINICAL DATA:  Clemens, pain EXAM: RIGHT TIBIA AND FIBULA - 2 VIEW COMPARISON:  11/20/2019 FINDINGS: Frontal and lateral views of the right tibia and fibula are obtained. There are no acute displaced fractures. Alignment of the right knee and ankle is anatomic. Soft tissues are normal. IMPRESSION: 1. No acute displaced fracture. Electronically Signed   By: Ozell Daring M.D.   On: 02/18/2024 16:18   DG Pelvis 1-2 Views Result Date: 02/18/2024 CLINICAL DATA:  Clemens, weakness EXAM: PELVIS - 1-2 VIEW COMPARISON:  02/01/2018 FINDINGS: Supine frontal view of the pelvis includes both hips. No acute displaced fracture, subluxation, or dislocation. Joint spaces are well preserved. Sacroiliac joints are unremarkable. IMPRESSION: 1. No acute displaced fracture. Electronically Signed   By: Ozell Daring M.D.   On: 02/18/2024 16:17   DG Chest Portable 1 View Result Date: 02/18/2024 CLINICAL DATA:  Dizziness, fell, decreased appetite EXAM: PORTABLE CHEST 1 VIEW COMPARISON:  11/03/2022 FINDINGS: Single frontal view of the chest demonstrates an unremarkable cardiac silhouette. Scattered nodular opacities are seen within the right mid and lower lung zones, which could be inflammatory or infectious. Close follow-up is recommended to assess for resolution and exclude underlying pulmonary nodules. No effusion or pneumothorax. No acute bony abnormalities. IMPRESSION: 1. Patchy nodular consolidation within the right mid and lower lung zones, which could be inflammatory or infectious. Followup PA  and lateral chest X-ray is recommended in 3-4 weeks following trial of antibiotic therapy to ensure resolution and exclude underlying parenchymal lung nodules. Electronically Signed   By: Ozell Daring M.D.   On: 02/18/2024 16:15    Assessment/Plan  1. Hypokalemia (Primary) Lab Results  Component Value Date   K 3.6 02/25/2024    -  secondary to chronic diarrhea and poor oral intake. Potassium supplementation ongoing. - Continue potassium 20 mEq twice daily. - Basic Metabolic Panel - CBC with Differential/Platelets  2. Poor appetite -  Poor appetite and weight loss, possibly related to recent pneumonia and chronic diarrhea. Ensure nutritional supplement recommended to improve appetite and energy levels. - Continue Ensure nutritional supplement  twice daily.  3. Bronchopneumonia -  Recent hospitalization for bronchopneumonia treated with ceftriaxone  and doxycycline . Smoking may contribute to recurrent pneumonia. Dulera  inhaler prescribed for respiratory support. - Continue Dulera  inhaler as needed for respiratory support. - Encouraged smoking cessation with nicotine  patch. - albuterol  (VENTOLIN  HFA) 108 (90 Base) MCG/ACT inhaler; Inhale 2 puffs into the lungs every 6 (six) hours as needed for wheezing or shortness of breath.  Dispense: 18 g; Refill: 3 - mometasone -formoterol  (DULERA ) 200-5 MCG/ACT AERO; Inhale 1 puff into the lungs daily.  Dispense: 13 g; Refill: 3  4. Tobacco use disorder -  Current nicotine  dependence with smoking 3-4 cigarettes per day. Smoking cessation advised to reduce risk of recurrent pneumonia. Nicotine  patch prescribed to aid cessation. - Prescribed nicotine  patch to aid smoking cessation. - Encouraged smoking cessation. - nicotine  (NICODERM CQ  - DOSED IN MG/24 HOURS) 21 mg/24hr patch; Place 1 patch (21 mg total) onto the skin daily.  Dispense: 28 patch; Refill: 1  5. Acute on chronic kidney disease stage 4 - likely secondary to dehydration from chronic  diarrhea. Recent hospitalization for acute kidney injury. Follow-up with nephrology necessary. - Referred to nephrology for further management. - Ordered blood work to monitor kidney function. - Ambulatory referral to Nephrology  6. Neuropathy -  Continue gabapentin  300 mg at bedtime  7. Plaque psoriasis -  with involvement of skin and nails. Tremfya  injections every 8 weeks. Betamethasone ointment for topical management. - Continue Tremfya  injections every 8 weeks. - Apply betamethasone ointment twice daily as needed. -  follows up with Dermatology - augmented betamethasone dipropionate (DIPROLENE-AF) 0.05 % ointment; Apply topically 2 (two) times daily as needed.  Dispense: 50 g; Refill: 3  8. Chronic diarrhea -  likely secondary to short gut surgery. Episodes increased from 3-4 to 8-10 times per day. Previous treatment with loperamide and potassium supplementation. Metamucil recommended to bulk stool. - Start Metamucil one packet daily to bulk stool. - Continue loperamide as needed for diarrhea. - psyllium (HYDROCIL/METAMUCIL) 95 % PACK; Take 1 packet by mouth daily.  Dispense: 30 packet; Refill: 11  9. Vertigo -  Managed with meclizine  as needed. - Continue meclizine  25 mg three times daily as needed for dizziness.  10. Immunization due -  - Flu vaccine HIGH DOSE PF(Fluzone Trivalent)  11. Onychomycosis -  with long thickened  toenails. Referral to podiatry for further management. - Referred to podiatr y for toenail management. - Ambulatory referral to Podiatry  12. Vitamin D deficiency Last vitamin D Lab Results  Component Value Date   VD25OH 22.55 (L) 02/25/2024    -  High-dose vitamin D supplementation recommended. - Prescribed vitamin D 50,000 IU once weekly. - Vitamin D, Ergocalciferol, (DRISDOL) 1.25 MG (50000 UNIT) CAPS capsule; Take 1 capsule (50,000 Units total) by mouth every 7 (seven) days.  Dispense: 5 capsule; Refill: 2  13. Screening mammogram for breast  cancer - MM Digital Screening  14. Screening for osteoporosis - DG Bone Density; Future  15. Screen for STD (sexually transmitted disease) - Hep C Antibody  16. Encounter to establish care -  established care with [facility]    General Health Maintenance Routine health maintenance discussed. Mammogram and bone density screening recommended. Flu vaccine administered. - Ordered mammogram for breast cancer screening. - Ordered bone density scan for osteoporosis screening. - Administered high-dose flu vaccine.     Labs/tests ordered: Mammogram, BMP, CBC, hepatitis C and HIV antibody, bone density   Return in about 2 weeks (around 03/21/2024).  Ostin Mathey Medina-Vargas, NP

## 2024-03-08 ENCOUNTER — Ambulatory Visit: Payer: Self-pay | Admitting: Adult Health

## 2024-03-08 LAB — HEPATITIS C ANTIBODY: Hepatitis C Ab: NONREACTIVE

## 2024-03-08 LAB — CBC WITH DIFFERENTIAL/PLATELET
Absolute Lymphocytes: 1495 {cells}/uL (ref 850–3900)
Absolute Monocytes: 339 {cells}/uL (ref 200–950)
Basophils Absolute: 11 {cells}/uL (ref 0–200)
Basophils Relative: 0.1 %
Eosinophils Absolute: 53 {cells}/uL (ref 15–500)
Eosinophils Relative: 0.5 %
HCT: 34.7 % — ABNORMAL LOW (ref 35.0–45.0)
Hemoglobin: 10.6 g/dL — ABNORMAL LOW (ref 11.7–15.5)
MCH: 32.4 pg (ref 27.0–33.0)
MCHC: 30.5 g/dL — ABNORMAL LOW (ref 32.0–36.0)
MCV: 106.1 fL — ABNORMAL HIGH (ref 80.0–100.0)
MPV: 11.8 fL (ref 7.5–12.5)
Monocytes Relative: 3.2 %
Neutro Abs: 8703 {cells}/uL — ABNORMAL HIGH (ref 1500–7800)
Neutrophils Relative %: 82.1 %
Platelets: 288 Thousand/uL (ref 140–400)
RBC: 3.27 Million/uL — ABNORMAL LOW (ref 3.80–5.10)
RDW: 14.3 % (ref 11.0–15.0)
Total Lymphocyte: 14.1 %
WBC: 10.6 Thousand/uL (ref 3.8–10.8)

## 2024-03-08 LAB — BASIC METABOLIC PANEL WITH GFR
BUN/Creatinine Ratio: 11 (calc) (ref 6–22)
BUN: 39 mg/dL — ABNORMAL HIGH (ref 7–25)
CO2: 12 mmol/L — ABNORMAL LOW (ref 20–32)
Calcium: 8.8 mg/dL (ref 8.6–10.4)
Chloride: 118 mmol/L — ABNORMAL HIGH (ref 98–110)
Creat: 3.69 mg/dL — ABNORMAL HIGH (ref 0.50–1.05)
Glucose, Bld: 118 mg/dL — ABNORMAL HIGH (ref 65–99)
Potassium: 4.1 mmol/L (ref 3.5–5.3)
Sodium: 140 mmol/L (ref 135–146)
eGFR: 13 mL/min/1.73m2 — ABNORMAL LOW (ref >=60)

## 2024-03-08 NOTE — Progress Notes (Signed)
-    Talked to patient and her boyfriend regarding the kidney function which has gotten worse. Patient is not eating nor drinking. Advised patient to go to ED for possible IV fluids.

## 2024-03-12 ENCOUNTER — Ambulatory Visit: Payer: Self-pay

## 2024-03-12 NOTE — Telephone Encounter (Signed)
 FYI Only or Action Required?: FYI only for provider: appointment scheduled on tomorrow.  Patient was last seen in primary care on 03/07/2024 by Medina-Vargas, Jereld BROCKS, NP.  Called Nurse Triage reporting Fatigue.  Symptoms began ongoing.  Interventions attempted: Rest, hydration, or home remedies.  Symptoms are: gradually improving.  Triage Disposition: See Physician Within 24 Hours  Patient/caregiver understands and will follow disposition?: Yes, will follow disposition  Copied from CRM #[id number]. Topic: Clinical - Medical Advice >> Mar 12, 2024  8:35 AM Graeme ORN wrote: Reason for CRM: Patient boyfriend called. Request call back from provider. Was told to go to ED. Did not go but EMS came and gave her IV fluids. She is still not eating and does not want to do anything. Thank You Reason for Disposition  [1] MODERATE dizziness (e.g., interferes with normal activities) AND [2] has NOT been evaluated by doctor (or NP/PA) for this  (Exception: Dizziness caused by heat exposure, sudden standing, or poor fluid intake.)  Answer Assessment - Initial Assessment Questions 1. DESCRIPTION: Describe your dizziness.     Can walk around without help,  2. LIGHTHEADED: Do you feel lightheaded? (e.g., somewhat faint, woozy, weak upon standing)     States that she has been dizzy for about 20 years 3. VERTIGO: Do you feel like either you or the room is spinning or tilting? (i.e., vertigo)     Room is spinning 4. SEVERITY: How bad is it?  Do you feel like you are going to faint? Can you stand and walk?     Can stand and walk 5. ONSET:  When did the dizziness begin?     20 years or more, 10. OTHER SYMPTOMS: Do you have any other symptoms? (e.g., fever, chest pain, vomiting, diarrhea, bleeding)       Denies SOB, states she has CP-started last week did talk to hosp staff about it last week states that it has improved since then  Pt SO calling, stating that pt is not eating. Pt states that  she was not eating as she was nauseous. Pt has not eaten for 3 days. Pt scheduled for tomorrow. Pt denies SOB, denies worsening CP. Denies fever. Pt was recently hospitalized for pneumonia.  Protocols used: Dizziness - Lightheadedness-A-AH

## 2024-03-13 ENCOUNTER — Emergency Department (HOSPITAL_COMMUNITY)

## 2024-03-13 ENCOUNTER — Other Ambulatory Visit: Payer: Self-pay

## 2024-03-13 ENCOUNTER — Encounter (HOSPITAL_COMMUNITY): Payer: Self-pay

## 2024-03-13 ENCOUNTER — Ambulatory Visit: Admitting: Adult Health

## 2024-03-13 ENCOUNTER — Inpatient Hospital Stay (HOSPITAL_COMMUNITY)
Admission: EM | Admit: 2024-03-13 | Discharge: 2024-03-26 | DRG: 682 | Disposition: A | Discharge status: Home Health | Attending: Internal Medicine | Admitting: Internal Medicine

## 2024-03-13 DIAGNOSIS — N184 Chronic kidney disease, stage 4 (severe): Secondary | ICD-10-CM | POA: Diagnosis present

## 2024-03-13 DIAGNOSIS — B3781 Candidal esophagitis: Secondary | ICD-10-CM | POA: Diagnosis present

## 2024-03-13 DIAGNOSIS — R55 Syncope and collapse: Secondary | ICD-10-CM | POA: Diagnosis present

## 2024-03-13 DIAGNOSIS — L89312 Pressure ulcer of right buttock, stage 2: Secondary | ICD-10-CM | POA: Diagnosis present

## 2024-03-13 DIAGNOSIS — Z681 Body mass index (BMI) 19 or less, adult: Secondary | ICD-10-CM

## 2024-03-13 DIAGNOSIS — E162 Hypoglycemia, unspecified: Secondary | ICD-10-CM | POA: Diagnosis present

## 2024-03-13 DIAGNOSIS — E042 Nontoxic multinodular goiter: Secondary | ICD-10-CM | POA: Diagnosis present

## 2024-03-13 DIAGNOSIS — K317 Polyp of stomach and duodenum: Secondary | ICD-10-CM | POA: Diagnosis present

## 2024-03-13 DIAGNOSIS — R627 Adult failure to thrive: Secondary | ICD-10-CM | POA: Diagnosis present

## 2024-03-13 DIAGNOSIS — I700 Atherosclerosis of aorta: Secondary | ICD-10-CM | POA: Diagnosis present

## 2024-03-13 DIAGNOSIS — K909 Intestinal malabsorption, unspecified: Secondary | ICD-10-CM

## 2024-03-13 DIAGNOSIS — Z7189 Other specified counseling: Secondary | ICD-10-CM

## 2024-03-13 DIAGNOSIS — R64 Cachexia: Secondary | ICD-10-CM | POA: Diagnosis present

## 2024-03-13 DIAGNOSIS — K3189 Other diseases of stomach and duodenum: Secondary | ICD-10-CM | POA: Diagnosis present

## 2024-03-13 DIAGNOSIS — K219 Gastro-esophageal reflux disease without esophagitis: Secondary | ICD-10-CM | POA: Diagnosis present

## 2024-03-13 DIAGNOSIS — K529 Noninfective gastroenteritis and colitis, unspecified: Secondary | ICD-10-CM | POA: Diagnosis present

## 2024-03-13 DIAGNOSIS — Z882 Allergy status to sulfonamides status: Secondary | ICD-10-CM

## 2024-03-13 DIAGNOSIS — E43 Unspecified severe protein-calorie malnutrition: Secondary | ICD-10-CM | POA: Diagnosis present

## 2024-03-13 DIAGNOSIS — D631 Anemia in chronic kidney disease: Secondary | ICD-10-CM

## 2024-03-13 DIAGNOSIS — Z90710 Acquired absence of both cervix and uterus: Secondary | ICD-10-CM

## 2024-03-13 DIAGNOSIS — N183 Chronic kidney disease, stage 3 unspecified: Secondary | ICD-10-CM | POA: Diagnosis present

## 2024-03-13 DIAGNOSIS — F0394 Unspecified dementia, unspecified severity, with anxiety: Secondary | ICD-10-CM | POA: Diagnosis present

## 2024-03-13 DIAGNOSIS — N390 Urinary tract infection, site not specified: Secondary | ICD-10-CM | POA: Diagnosis not present

## 2024-03-13 DIAGNOSIS — Z1152 Encounter for screening for COVID-19: Secondary | ICD-10-CM

## 2024-03-13 DIAGNOSIS — E878 Other disorders of electrolyte and fluid balance, not elsewhere classified: Secondary | ICD-10-CM | POA: Diagnosis present

## 2024-03-13 DIAGNOSIS — K8689 Other specified diseases of pancreas: Secondary | ICD-10-CM | POA: Diagnosis present

## 2024-03-13 DIAGNOSIS — G9341 Metabolic encephalopathy: Secondary | ICD-10-CM | POA: Diagnosis present

## 2024-03-13 DIAGNOSIS — Z7985 Long-term (current) use of injectable non-insulin antidiabetic drugs: Secondary | ICD-10-CM

## 2024-03-13 DIAGNOSIS — R197 Diarrhea, unspecified: Secondary | ICD-10-CM | POA: Diagnosis not present

## 2024-03-13 DIAGNOSIS — N200 Calculus of kidney: Secondary | ICD-10-CM | POA: Diagnosis present

## 2024-03-13 DIAGNOSIS — E8809 Other disorders of plasma-protein metabolism, not elsewhere classified: Secondary | ICD-10-CM | POA: Diagnosis present

## 2024-03-13 DIAGNOSIS — I129 Hypertensive chronic kidney disease with stage 1 through stage 4 chronic kidney disease, or unspecified chronic kidney disease: Secondary | ICD-10-CM | POA: Diagnosis present

## 2024-03-13 DIAGNOSIS — L8990 Pressure ulcer of unspecified site, unspecified stage: Secondary | ICD-10-CM | POA: Insufficient documentation

## 2024-03-13 DIAGNOSIS — R630 Anorexia: Secondary | ICD-10-CM

## 2024-03-13 DIAGNOSIS — L4050 Arthropathic psoriasis, unspecified: Secondary | ICD-10-CM | POA: Diagnosis present

## 2024-03-13 DIAGNOSIS — G629 Polyneuropathy, unspecified: Secondary | ICD-10-CM | POA: Diagnosis present

## 2024-03-13 DIAGNOSIS — E860 Dehydration: Secondary | ICD-10-CM | POA: Diagnosis present

## 2024-03-13 DIAGNOSIS — F32A Depression, unspecified: Secondary | ICD-10-CM | POA: Diagnosis present

## 2024-03-13 DIAGNOSIS — F17210 Nicotine dependence, cigarettes, uncomplicated: Secondary | ICD-10-CM | POA: Diagnosis present

## 2024-03-13 DIAGNOSIS — R42 Dizziness and giddiness: Secondary | ICD-10-CM | POA: Diagnosis present

## 2024-03-13 DIAGNOSIS — Z79899 Other long term (current) drug therapy: Secondary | ICD-10-CM

## 2024-03-13 DIAGNOSIS — E872 Acidosis, unspecified: Secondary | ICD-10-CM | POA: Diagnosis present

## 2024-03-13 DIAGNOSIS — N179 Acute kidney failure, unspecified: Secondary | ICD-10-CM | POA: Diagnosis not present

## 2024-03-13 DIAGNOSIS — G4700 Insomnia, unspecified: Secondary | ICD-10-CM | POA: Diagnosis present

## 2024-03-13 DIAGNOSIS — F0393 Unspecified dementia, unspecified severity, with mood disturbance: Secondary | ICD-10-CM | POA: Diagnosis present

## 2024-03-13 DIAGNOSIS — E876 Hypokalemia: Secondary | ICD-10-CM | POA: Diagnosis present

## 2024-03-13 DIAGNOSIS — R531 Weakness: Secondary | ICD-10-CM | POA: Diagnosis not present

## 2024-03-13 DIAGNOSIS — Z7951 Long term (current) use of inhaled steroids: Secondary | ICD-10-CM

## 2024-03-13 DIAGNOSIS — K449 Diaphragmatic hernia without obstruction or gangrene: Secondary | ICD-10-CM | POA: Diagnosis present

## 2024-03-13 DIAGNOSIS — K222 Esophageal obstruction: Secondary | ICD-10-CM | POA: Diagnosis present

## 2024-03-13 DIAGNOSIS — D539 Nutritional anemia, unspecified: Secondary | ICD-10-CM | POA: Diagnosis present

## 2024-03-13 LAB — CBC WITH DIFFERENTIAL/PLATELET
Basophils Absolute: 0.1 K/uL (ref 0.0–0.1)
Basophils Relative: 1 %
Eosinophils Absolute: 0.1 K/uL (ref 0.0–0.5)
Eosinophils Relative: 1 %
HCT: 31.7 % — ABNORMAL LOW (ref 36.0–46.0)
Hemoglobin: 9.9 g/dL — ABNORMAL LOW (ref 12.0–15.0)
Lymphocytes Relative: 22 %
Lymphs Abs: 2.1 K/uL (ref 0.7–4.0)
MCH: 33.2 pg (ref 26.0–34.0)
MCHC: 31.2 g/dL (ref 30.0–36.0)
MCV: 106.4 fL — ABNORMAL HIGH (ref 80.0–100.0)
Monocytes Absolute: 0.1 K/uL (ref 0.1–1.0)
Monocytes Relative: 1 %
Neutro Abs: 7.1 K/uL (ref 1.7–7.7)
Neutrophils Relative %: 75 %
Platelets: 394 K/uL (ref 150–400)
RBC: 2.98 MIL/uL — ABNORMAL LOW (ref 3.87–5.11)
RDW: 16.8 % — ABNORMAL HIGH (ref 11.5–15.5)
WBC: 9.4 K/uL (ref 4.0–10.5)
nRBC: 3.3 % — ABNORMAL HIGH (ref 0.0–0.2)

## 2024-03-13 LAB — RESP PANEL BY RT-PCR (RSV, FLU A&B, COVID)  RVPGX2
Influenza A by PCR: NEGATIVE
Influenza B by PCR: NEGATIVE
Resp Syncytial Virus by PCR: NEGATIVE
SARS Coronavirus 2 by RT PCR: NEGATIVE

## 2024-03-13 LAB — COMPREHENSIVE METABOLIC PANEL WITH GFR
ALT: 15 U/L (ref 0–44)
ALT: 16 U/L (ref 0–44)
AST: 16 U/L (ref 15–41)
AST: 19 U/L (ref 15–41)
Albumin: 2.5 g/dL — ABNORMAL LOW (ref 3.5–5.0)
Albumin: 2.6 g/dL — ABNORMAL LOW (ref 3.5–5.0)
Alkaline Phosphatase: 74 U/L (ref 38–126)
Alkaline Phosphatase: 72 U/L (ref 38–126)
Anion gap: 6 (ref 5–15)
Anion gap: 10 (ref 5–15)
BUN: 42 mg/dL — ABNORMAL HIGH (ref 8–23)
BUN: 40 mg/dL — ABNORMAL HIGH (ref 8–23)
CO2: 11 mmol/L — ABNORMAL LOW (ref 22–32)
CO2: 11 mmol/L — ABNORMAL LOW (ref 22–32)
Calcium: 9.2 mg/dL (ref 8.9–10.3)
Calcium: 8.8 mg/dL — ABNORMAL LOW (ref 8.9–10.3)
Chloride: 126 mmol/L — ABNORMAL HIGH (ref 98–111)
Chloride: 121 mmol/L — ABNORMAL HIGH (ref 98–111)
Creatinine, Ser: 3.38 mg/dL — ABNORMAL HIGH (ref 0.44–1.00)
Creatinine, Ser: 3.26 mg/dL — ABNORMAL HIGH (ref 0.44–1.00)
GFR, Estimated: 14 mL/min — ABNORMAL LOW (ref >60)
GFR, Estimated: 15 mL/min — ABNORMAL LOW (ref >60)
Glucose, Bld: 73 mg/dL (ref 70–99)
Glucose, Bld: 131 mg/dL — ABNORMAL HIGH (ref 70–99)
Potassium: 4.8 mmol/L (ref 3.5–5.1)
Potassium: 4.1 mmol/L (ref 3.5–5.1)
Sodium: 143 mmol/L (ref 135–145)
Sodium: 142 mmol/L (ref 135–145)
Total Bilirubin: 0.5 mg/dL (ref 0.0–1.2)
Total Bilirubin: 0.6 mg/dL (ref 0.0–1.2)
Total Protein: 6 g/dL — ABNORMAL LOW (ref 6.5–8.1)
Total Protein: 6.1 g/dL — ABNORMAL LOW (ref 6.5–8.1)

## 2024-03-13 LAB — I-STAT CHEM 8, ED
BUN: 73 mg/dL — ABNORMAL HIGH (ref 8–23)
BUN: 52 mg/dL — ABNORMAL HIGH (ref 8–23)
Calcium, Ion: 1.37 mmol/L (ref 1.15–1.40)
Calcium, Ion: 1.4 mmol/L (ref 1.15–1.40)
Chloride: 129 mmol/L — ABNORMAL HIGH (ref 98–111)
Chloride: 128 mmol/L — ABNORMAL HIGH (ref 98–111)
Creatinine, Ser: 3.9 mg/dL — ABNORMAL HIGH (ref 0.44–1.00)
Creatinine, Ser: 3.8 mg/dL — ABNORMAL HIGH (ref 0.44–1.00)
Glucose, Bld: 75 mg/dL (ref 70–99)
Glucose, Bld: 65 mg/dL — ABNORMAL LOW (ref 70–99)
HCT: 36 % (ref 36.0–46.0)
HCT: 29 % — ABNORMAL LOW (ref 36.0–46.0)
Hemoglobin: 12.2 g/dL (ref 12.0–15.0)
Hemoglobin: 9.9 g/dL — ABNORMAL LOW (ref 12.0–15.0)
Potassium: 8.2 mmol/L (ref 3.5–5.1)
Potassium: 5.2 mmol/L — ABNORMAL HIGH (ref 3.5–5.1)
Sodium: 142 mmol/L (ref 135–145)
Sodium: 146 mmol/L — ABNORMAL HIGH (ref 135–145)
TCO2: 14 mmol/L — ABNORMAL LOW (ref 22–32)
TCO2: 13 mmol/L — ABNORMAL LOW (ref 22–32)

## 2024-03-13 LAB — URINALYSIS, ROUTINE W REFLEX MICROSCOPIC
Bilirubin Urine: NEGATIVE
Glucose, UA: NEGATIVE mg/dL
Ketones, ur: NEGATIVE mg/dL
Leukocytes,Ua: NEGATIVE
Nitrite: NEGATIVE
Protein, ur: NEGATIVE mg/dL
Specific Gravity, Urine: 1.009 (ref 1.005–1.030)
pH: 5 (ref 5.0–8.0)

## 2024-03-13 LAB — RAPID URINE DRUG SCREEN, HOSP PERFORMED
Amphetamines: NOT DETECTED
Barbiturates: NOT DETECTED
Benzodiazepines: NOT DETECTED
Cocaine: NOT DETECTED
Opiates: NOT DETECTED
Tetrahydrocannabinol: NOT DETECTED

## 2024-03-13 LAB — LIPASE, BLOOD: Lipase: 89 U/L — ABNORMAL HIGH (ref 11–51)

## 2024-03-13 LAB — ETHANOL: Alcohol, Ethyl (B): <15 mg/dL (ref <15)

## 2024-03-13 LAB — AMMONIA: Ammonia: <13 umol/L (ref 9–35)

## 2024-03-13 MED ORDER — ENSURE ENLIVE PO LIQD: 237.0000 mL | Freq: Every day | ORAL | Status: DC

## 2024-03-13 MED ORDER — SODIUM CHLORIDE 0.9 % IV BOLUS
1000.0000 mL | Freq: Once | INTRAVENOUS | Status: AC
  Administered 2024-03-13: 1000 mL via INTRAVENOUS

## 2024-03-13 MED ORDER — DEXTROSE 50 % IV SOLN: 1.0000 | Freq: Once | INTRAVENOUS | Status: DC

## 2024-03-13 MED ORDER — SODIUM ZIRCONIUM CYCLOSILICATE 10 G PO PACK
10.0000 g | PACK | Freq: Once | ORAL | Status: AC
  Administered 2024-03-13: 10 g via ORAL
  Filled 2024-03-13: qty 1

## 2024-03-13 MED ORDER — ORAL CARE MOUTH RINSE: 15.0000 mL | OROMUCOSAL | Status: DC | PRN

## 2024-03-13 MED ORDER — PSYLLIUM 95 % PO PACK
1.0000 | PACK | Freq: Every day | ORAL | Status: DC
  Administered 2024-03-13 – 2024-03-26 (×7): 1 via ORAL
  Filled 2024-03-13 (×14): qty 1

## 2024-03-13 MED ORDER — HEPARIN SODIUM (PORCINE) 5000 UNIT/ML IJ SOLN
5000.0000 [IU] | Freq: Three times a day (TID) | INTRAMUSCULAR | Status: AC
  Administered 2024-03-13 – 2024-03-15 (×5): 5000 [IU] via SUBCUTANEOUS
  Filled 2024-03-13 (×8): qty 1

## 2024-03-13 MED ORDER — FOLIC ACID 1 MG PO TABS
1.0000 mg | ORAL_TABLET | Freq: Every day | ORAL | Status: DC
  Administered 2024-03-13 – 2024-03-17 (×5): 1 mg via ORAL
  Filled 2024-03-13 (×8): qty 1

## 2024-03-13 MED ORDER — NICOTINE 14 MG/24HR TD PT24
14.0000 mg | MEDICATED_PATCH | Freq: Every day | TRANSDERMAL | Status: DC
  Administered 2024-03-13 – 2024-03-25 (×12): 14 mg via TRANSDERMAL
  Filled 2024-03-13 (×14): qty 1

## 2024-03-13 MED ORDER — ACETAMINOPHEN 325 MG PO TABS
650.0000 mg | ORAL_TABLET | ORAL | Status: DC | PRN
  Administered 2024-03-14: 650 mg via ORAL
  Filled 2024-03-13: qty 2

## 2024-03-13 MED ORDER — INSULIN ASPART 100 UNIT/ML IV SOLN: 5.0000 [IU] | Freq: Once | INTRAVENOUS | Status: DC

## 2024-03-13 MED ORDER — ADULT MULTIVITAMIN W/MINERALS CH
1.0000 | ORAL_TABLET | Freq: Every day | ORAL | Status: DC
  Administered 2024-03-14 – 2024-03-26 (×10): 1 via ORAL
  Filled 2024-03-13 (×14): qty 1

## 2024-03-13 MED ORDER — BUDESON-GLYCOPYRROL-FORMOTEROL 160-9-4.8 MCG/ACT IN AERO
2.0000 | INHALATION_SPRAY | Freq: Two times a day (BID) | RESPIRATORY_TRACT | Status: DC
  Administered 2024-03-13 – 2024-03-14 (×2): 2 via RESPIRATORY_TRACT
  Filled 2024-03-13: qty 5.9

## 2024-03-13 MED ORDER — ENSURE PLUS HIGH PROTEIN PO LIQD
237.0000 mL | Freq: Two times a day (BID) | ORAL | Status: DC
  Administered 2024-03-13 – 2024-03-26 (×16): 237 mL via ORAL

## 2024-03-13 MED ORDER — DEXTROSE 10 % IV SOLN: INTRAVENOUS | Status: DC

## 2024-03-13 MED ORDER — CALCIUM GLUCONATE-NACL 1-0.675 GM/50ML-% IV SOLN
1.0000 g | Freq: Once | INTRAVENOUS | Status: AC
  Administered 2024-03-13: 1000 mg via INTRAVENOUS
  Filled 2024-03-13: qty 50

## 2024-03-13 MED ORDER — LACTATED RINGERS IV BOLUS
1000.0000 mL | Freq: Once | INTRAVENOUS | Status: AC
  Administered 2024-03-13: 1000 mL via INTRAVENOUS

## 2024-03-13 MED ORDER — LOPERAMIDE HCL 2 MG PO CAPS
4.0000 mg | ORAL_CAPSULE | Freq: Three times a day (TID) | ORAL | Status: DC
  Administered 2024-03-13 – 2024-03-15 (×6): 4 mg via ORAL
  Filled 2024-03-13 (×7): qty 2

## 2024-03-13 NOTE — H&P (Cosign Needed)
 Date: 03/13/2024               Patient Name:  Whitney Parrish MRN: [id number]  DOB: 1955/08/16 Age / Sex: 68 y.o., female   PCP: Phyllis Jereld BROCKS, NP         Medical Service: Internal Medicine Teaching Service         Attending Physician: Dr. MICAEL Riis Winfrey      First Contact: Rebecka Pion, DO}    Second Contact: Dr. Drue Grow, MD         Pager Information: First Contact Pager: [phone]   Second Contact Pager: [phone]   SUBJECTIVE   Chief Complaint: Syncope and generalized weakness   History of Present Illness:  Whitney Parrish is a 68 year old female with a past medical history of CKD stage 4, hypokalemia, asthma, GERD, psoriasis, depression, and insomnia, who presents via EMS for evaluation of syncope and generalized weakness ongoing for the past two weeks.  Ms. Sze was previously hospitalized on 10/18 for syncope and was found to have severe hypokalemia secondary to chronic diarrhea. She was discharged home on 10/25 with potassium supplementation per nephrology recommendations and was stable at the time of discharge.  Her significant other reports that her diarrhea and loss of appetite recurred a few days after discharge. Since then, she has had poor oral intake due to decreased appetite. During her last admission, she was started on mirtazapine , which initially improved her appetite and mood; however, her appetite has declined again since returning home,unclear if she stopped taking her mirtazapine .Her significant other  report she has not been able to take her Imodium frequently.  Her partner reports they have tried various types of food to stimulate her appetite without success. This morning, the patient was too weak to get out of bed with a feeling of almost passing out, prompting the call to EMS. She reports progressive generalized weakness since the recurrence of her diarrhea.  She denies fever, chest pain, cough, shortness of breath, or melena, though she  endorses intermittent chills.  In the ED, CBC was unremarkable for leukocytosis, with a stable hemoglobin of 9.9 g/dL. CMP demonstrated an initial hyperkalemia of 8.2 mmol/L( likely lab error ) , which improved to 4.8 mmol/L on repeat testing. Creatinine was elevated at 3.38  with an estimated GFR of 14 , accompanied by hyperchloremia (126 ) and low bicarbonate (11). Ammonia level was less than 13 mol/L. Urinalysis showed small amounts of hemoglobin. Imaging studies included a chest X-ray showing no acute cardiopulmonary process, a CT abdomen/pelvis without acute intra-abdominal findings but demonstrating prominent liquid stool in the distal colon consistent with a diarrheal state, and a non-contrast CT head revealing no acute intracranial abnormality.  Meds:  Patient brought these meds with her  Gabapentin  300 mg - once daily Potassium chloride  20 mEq - twice daily Mirtazapine  7.5 mg - nightly Loperamide 4 mg - four times daily Folic acid 1 mg - once daily Meclizine  25 mg - as needed (or specify frequency if known) Vitamin D2 (ergocalciferol) 50,000 units - once weekly   Past Medical History CKD GERD Hypertension Chronic diarrhea Psoriasis Hematuria Asthma Tobacco use disorder    Past Surgical History  Past Surgical History:  Procedure Laterality Date   BREAST CYST EXCISION  12/15/2011   Procedure: CYST EXCISION BREAST;  Surgeon: Vicenta DELENA Poli, MD;  Location: [facility];  Service: General;  Laterality: Left;  Excision of a Chronic Left Breast Sebaceous Cyst   CARDIOVASCULAR STRESS TEST  12-28-2012   Low risk nuclear study w/ small LV cavity which may be due LVH/  no ischemia or infarct/  normal wall motion, ef 67%   CYSTOSCOPY WITH STENT PLACEMENT Left 09/08/2015   Procedure: CYSTOSCOPY WITH LEFT STENT PLACEMENT;  Surgeon: Norleen Seltzer, MD;  Location: [facility];  Service: Urology;  Laterality: Left;   CYSTOSCOPY WITH URETEROSCOPY AND STENT PLACEMENT Left 09/25/2015   Procedure: LEFT   URETEROSCOPY STONE EXTRACTION;  Surgeon: Norleen Seltzer, MD;  Location: [facility];  Service: Urology;  Laterality: Left;   EXCISION AND REVISION SCAR ABDOMINAL WALL  01-22-2002   EXPLORATORY LAPAROTOMY  1986   w/ Abdominal Myomectomy  (per pt bowel muscle cut (injury))   HOLMIUM LASER APPLICATION Left 09/25/2015   Procedure: HOLMIUM LASER APPLICATION;  Surgeon: Norleen Seltzer, MD;  Location: [facility];  Service: Urology;  Laterality: Left;   I & D LEFT BUTTOCK ABSCESS   07-01-2010   x2 area's   NEGATIVE SLEEP STUDY  08-10-2005   SHOULDER ARTHROSCOPY WITH SUBACROMIAL DECOMPRESSION, ROTATOR CUFF REPAIR AND BICEP TENDON REPAIR Right 11/12/2021   Procedure: SHOULDER ARTHROSCOPY WITH SUBACROMIAL DECOMPRESSION, ROTATOR CUFF REPAIR AND BICEP TENDON REPAIR;  Surgeon: Cristy Bonner DASEN, MD;  Location: [facility];  Service: Orthopedics;  Laterality: Right;   TRANSTHORACIC ECHOCARDIOGRAM  12-28-2012   grade 1 diastolic dysfunction, mild concentric LVH, ef 65-70%/  mild MR/  trivial PR and TR     Social:  Lives With: Lives in a house by herself Occupation: Retired Building Surveyor: She has a significant level check up on her Level of Function: She is independent in all ADLs and IADLs, cane for ambulation PCP:  Medina-Vargas, Monina C, NP  Substances: -Tobacco: 1 pack a week since age 28 -Alcohol: Denies current or previous use -Recreational Drug: Smoke weed during her teenage years but has not had anything to smoke for the past 20 years  Family History:  Unspecified cancer in the mother   Allergies: Allergy to sulfa antibiotics   Review of Systems: A complete ROS was negative except as per HPI.   OBJECTIVE:   Physical Exam: Blood pressure (!) 98/59, pulse 67, temperature (!) 97.5 F (36.4 C), temperature source Oral, resp. rate (!) 21, height 5' (1.524 m), weight 32.7 kg, SpO2 100%.   On examination, the patient appears ill, sleepy and cachectic, lying in bed  but in no acute distress. Head is atraumatic, and mucous membranes are dry. Conjunctivae are normal. Cardiovascular exam reveals a regular rate and rhythm with normal heart sounds. Lungs are clear to auscultation bilaterally. Abdomen is mildly tender in the epigastric region but no signs of peritonitis. Skin is warm and dry. Neurologically, the patient is intact--she is oriented to name and location but appears markedly lethargic.        Labs: CBC    Component Value Date/Time   WBC 9.4 03/13/2024 0830   RBC 2.98 (L) 03/13/2024 0830   HGB 9.9 (L) 03/13/2024 1202   HCT 29.0 (L) 03/13/2024 1202   PLT 394 03/13/2024 0830   MCV 106.4 (H) 03/13/2024 0830   MCH 33.2 03/13/2024 0830   MCHC 31.2 03/13/2024 0830   RDW 16.8 (H) 03/13/2024 0830   LYMPHSABS 2.1 03/13/2024 0830   MONOABS 0.1 03/13/2024 0830   EOSABS 0.1 03/13/2024 0830   BASOSABS 0.1 03/13/2024 0830     CMP     Component Value Date/Time   NA 142 03/13/2024 1737   K 4.1 03/13/2024 1737  CL 121 (H) 03/13/2024 1737   CO2 11 (L) 03/13/2024 1737   GLUCOSE 131 (H) 03/13/2024 1737   BUN 40 (H) 03/13/2024 1737   CREATININE 3.26 (H) 03/13/2024 1737   CREATININE 3.69 (H) 03/07/2024 1025   CALCIUM 8.8 (L) 03/13/2024 1737   PROT 6.1 (L) 03/13/2024 1737   ALBUMIN 2.6 (L) 03/13/2024 1737   AST 19 03/13/2024 1737   ALT 16 03/13/2024 1737   ALKPHOS 72 03/13/2024 1737   BILITOT 0.6 03/13/2024 1737   GFRNONAA 15 (L) 03/13/2024 1737   GFRAA 29 (L) 04/16/2018 1750    Imaging:  DG Chest Portable 1 View Result Date: 03/13/2024 EXAM: 1 VIEW(S) XRAY OF THE CHEST 03/13/2024 09:35:00 AM COMPARISON: None available. CLINICAL HISTORY: ams FINDINGS: LUNGS AND PLEURA: No focal pulmonary opacity. No pulmonary edema. No pleural effusion. No pneumothorax. HEART AND MEDIASTINUM: No acute abnormality of the cardiac and mediastinal silhouettes. BONES AND SOFT TISSUES: No acute osseous abnormality. IMPRESSION: 1. No acute cardiopulmonary  abnormality. Electronically signed by: Rogelia Myers MD 03/13/2024 09:56 AM EST RP Workstation: [id number]   CT ABDOMEN PELVIS WO CONTRAST Result Date: 03/13/2024 CLINICAL DATA:  Abdominal pain, acute, nonlocalized Altered mental status for 5 days. Recently hospitalized for pneumonia. Decreased oral intake. EXAM: CT ABDOMEN AND PELVIS WITHOUT CONTRAST TECHNIQUE: Multidetector CT imaging of the abdomen and pelvis was performed following the standard protocol without IV contrast. RADIATION DOSE REDUCTION: This exam was performed according to the departmental dose-optimization program which includes automated exposure control, adjustment of the mA and/or kV according to patient size and/or use of iterative reconstruction technique. COMPARISON:  Abdominopelvic CT 02/18/2024 and 07/08/2020. FINDINGS: Technical note: There is significant beam hardening artifact related to a bracelet on the right wrist. This limits assessment of the right lower chest and liver. Lower chest: Clear lung bases. No significant pleural or pericardial effusion. Hepatobiliary: Allowing for the artifact, the liver appears stable, without acute or focal abnormality on noncontrast imaging. No evidence of gallstones, gallbladder wall thickening or biliary dilatation. Pancreas: Unremarkable. No pancreatic ductal dilatation or surrounding inflammatory changes. Spleen: Normal in size without focal abnormality. Adrenals/Urinary Tract: Both adrenal glands appear normal. Unchanged bilateral nephrolithiasis, measuring up to 1.1 cm in the lower pole of the left kidney. No evidence of ureteral calculus or hydronephrosis. There is mild renal cortical scarring bilaterally. The bladder appears unremarkable for its degree of distention. Stomach/Bowel: No enteric contrast administered. The stomach appears unremarkable for its degree of distension. No evidence of bowel wall thickening, distention or surrounding inflammatory change. Retrocecal surgical clips  consistent with prior appendectomy. Prominent liquid stool in the distal colon. Vascular/Lymphatic: No enlarged abdominopelvic lymph nodes are identified. Mild aortoiliac atherosclerosis without evidence of aneurysm. Reproductive: Status post hysterectomy. No adnexal mass. Air in the vagina. Other: No evidence of abdominal wall mass or hernia. No ascites or pneumoperitoneum. Musculoskeletal: No acute or significant osseous findings. Mild lower lumbar facet arthropathy. IMPRESSION: 1. No acute findings or explanation for the patient's symptoms. 2. The visualized lung bases no appear clear, without significant residual right lower lobe bronchopneumonia. 3. Prominent liquid stool in the distal colon suggesting diarrheal state. No evidence of bowel wall thickening or surrounding inflammation. 4. Unchanged bilateral nephrolithiasis. No evidence of ureteral calculus or hydronephrosis. 5.  Aortic Atherosclerosis (ICD10-I70.0). Electronically Signed   By: Elsie Perone M.D.   On: 03/13/2024 09:20   CT Head Wo Contrast Result Date: 03/13/2024 EXAM: CT HEAD WITHOUT CONTRAST 03/13/2024 08:48:00 AM TECHNIQUE: CT of the head was performed without  the administration of intravenous contrast. Automated exposure control, iterative reconstruction, and/or weight based adjustment of the mA/kV was utilized to reduce the radiation dose to as low as reasonably achievable. COMPARISON: Brain MRI 11/15/2011. Head CT 02/18/2024. CLINICAL HISTORY: 68 year old female with altered mental status, recent pneumonia, and decreased oral intake. FINDINGS: BRAIN AND VENTRICLES: No acute hemorrhage. No evidence of acute infarct. No hydrocephalus. No extra-axial collection. No mass effect or midline shift. Brain volume is stable within normal limits for age. Gray white differentiation is stable, within normal limits for age. Mild for age calcified atherosclerosis at the skull base. No suspicious intracranial vascular hyperdensity. ORBITS: No acute  abnormality. SINUSES: Paranasal sinuses, middle ears and mastoids are clear. SOFT TISSUES AND SKULL: No acute soft tissue abnormality. No skull fracture. IMPRESSION: 1. Normal for age non-contrast head CT appearance of the brain. Electronically signed by: Helayne Hurst MD 03/13/2024 08:59 AM EST RP Workstation: [id number]     EKG: personally reviewed my interpretation is sinus rhythm.  ASSESSMENT & PLAN:   Assessment & Plan by Problem: Principal Problem:   Syncope Active Problems:   Dizziness   Gastroesophageal reflux disease   CKD (chronic kidney disease) stage 3, GFR 30-59 ml/min (HCC)   Protein-calorie malnutrition, severe   Acute renal failure   Weakness generalized   Pressure injury of skin   Jerel CHARM Fontana is a female with a medical history significant for CKD stage 4, hypokalemia, asthma, GERD, psoriasis, tobacco use disorder, depression, and insomnia, who presented with syncope and was admitted for further evaluation and management of a syncopal episode.  #Syncopal episode #Generalized weakness The patient presents with worsening generalized weakness over the past two weeks and a near-syncopal episode this morning upon awakening. Given her poor appetite and reduced oral intake during this period, her symptoms are most likely due to volume depletion and malnutrition in the setting of chronic diarrhea. CT imaging of the head and abdomen is reassuring, showing no acute intracranial, abdominal, or infectious process. EKG reveals no ischemic changes, hemoglobin remains stable, and urine drug screen is negative. Although a seizure cannot be entirely excluded, her presentation is not consistent with one. 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 She currently denies any blood in her stool.Will treat with IV fluids for rehydration, loperamide and  psyllium for diarrhea management, and nutritional supplementation with Ensure Boost.  Her folate ,vitamin A and B12 were all decreased which make me worry about malabsorption.  A dietitian will be consulted for assessment and management, as the patient appears severely malnourished. GI panel  and C. difficile studies from her last admission were all negative. No indication for antibiotics at this time. - Continue IV fluid hydration; transition to oral fluids as tolerated.  - Start loperamide 4 mg TID  scheduled and psyllium daily  - Encourage oral intake - RD consult for dietary optimization and calorie supplementation - Watch for refeeding syndrome, check mg, phosphorus  - Orthostatic vitals - PT/OT  #NAGMA #Chronic Diarrhea # History of chronic hypokalemia The patient presents with hyperchloremic non-anion gap metabolic acidosis  similar to her previous admission. Renal tubular acidosis was considered during her last admission; however, urine creatinine, sodium, and potassium were within normal limits, making RTA less likely despite her underlying chronic kidney disease. Her NAGMA is most likely secondary to profuse diarrhea. Her potassium is currently within normal limits; it will  be monitored and repleted as needed.  Treating her diarrhea per above. - Follow-up on VBG - Give Sodium bicarbonate if pH is less than 7.2 - BMP   #Acute on chronic kidney injury # CKD 4 #Bilateral nephrolithiasis The patient presented with a creatinine of 3.9, elevated from her baseline of 2-2.6. Urinalysis shows small RBCs, improved from her previous admission. CT imaging demonstrates bilateral nephrolithiasis without evidence of ureteral calculi or hydronephrosis, unchanged from prior imaging. She denies suprapubic pain, and there is no tenderness on exam. Given her low oral intake and severe dehydration, her acute kidney injury is most consistent with prerenal etiology. Her creatinine has gradually improved to  3.2 with IV fluid hydration - Continuing hydration per above - Trend serum creatinine  #Asthma - Resume Breztri daily  #Depression -  Holding Mirtazapine  until she is more awake  - No prolonged Qtc on EKG, could resume home Seroquel  when she is more awake   #GERD - Continue on home p.o. pantoprazole  40 mg  #Tobacco use disorder - Nicotine  patch daily  #Lower extremity psoriasis The patient self-administers Tremfya  1 mg every 8 weeks. She also uses betamethasone dipropionate 0.05% topically as needed for her legs.  #Vertigo - Take meclizine  25 mg 3 times daily as needed -  Will hold for now in setting of somnolence  #Peripheral neuropathy - Gabapentin  100 mg at bedtime - Will hold for now in the setting of somnolence   #Failure to thrive #Goals of care I am concerned about the possibility of an undiagnosed malignancy. The patient had an incidental finding in 2007 of a heterogeneously enlarged left thyroid  gland with nodules measuring up to 1.6 cm, for which nuclear medicine thyroid  uptake imaging and biopsy were recommended at that time. Chart review does not reveal any documented follow-up, and the patient is unsure if she pursued evaluation. I discussed goals of care extensively with Ms. Pickron; she elected to remain full code, although it appears she may not fully understand the implications. A palliative care consult will be initiated during this hospitalization. - Consult palliative  Best practice: Diet: Normal VTE: Heparin  IVF: NS,Bolus Code: Full  Disposition planning: Prior to Admission Living Arrangement: Home Anticipated Discharge Location: Home  Dispo: Admit patient to Observation with expected length of stay less than 2 midnights.  Signed: Renne Homans, MD Internal Medicine Resident  03/13/2024, 6:51 PM  On Call pager: [phone]

## 2024-03-13 NOTE — ED Notes (Signed)
 Phlebotomy unable to obtain blood work.

## 2024-03-13 NOTE — ED Notes (Signed)
 Hospitalist at bedside

## 2024-03-13 NOTE — ED Provider Notes (Signed)
 [place] [facility] AT [facility] Provider Note   CSN: [id number] Arrival date & time: 03/13/24  9247     Patient presents with: Altered Mental Status   Whitney Parrish is a 68 y.o. female.   Patient here from home with generalized weakness for the last several days.  Just discharged from the hospital recently.  She says her abdomen hurts.  Per EMS family states that she has not been eating or drinking.  She denies any fever chills or cough or nausea vomiting diarrhea.  History of depression anxiety panic attacks CKD hypertension reflux.  Denies any pain with urination.  Not sure if she is really been eating or drinking.  The history is provided by the patient and the EMS personnel.       Prior to Admission medications   Medication Sig Start Date End Date Taking? Authorizing Provider  albuterol  (VENTOLIN  HFA) 108 (90 Base) MCG/ACT inhaler Inhale 2 puffs into the lungs every 6 (six) hours as needed for wheezing or shortness of breath. 03/07/24   Medina-Vargas, Monina C, NP  augmented betamethasone dipropionate (DIPROLENE-AF) 0.05 % ointment Apply topically 2 (two) times daily as needed. 03/07/24   Medina-Vargas, Monina C, NP  cyanocobalamin 1000 MCG tablet Take 1 tablet (1,000 mcg total) by mouth daily. 02/26/24   Amoako, Prince, MD  dicyclomine  (BENTYL ) 10 MG capsule Take 10 mg by mouth in the morning and at bedtime. Patient not taking: Reported on 03/07/2024    [provider]  feeding supplement (ENSURE ENLIVE / ENSURE PLUS) LIQD Take 237 mLs by mouth daily at 6 (six) AM. Patient taking differently: Take 237 mLs by mouth 3 (three) times daily between meals. 11/08/22   Claudene Pacific, MD  folic acid (FOLVITE) 1 MG tablet Take 1 tablet (1 mg total) by mouth daily. 02/26/24   Amoako, Prince, MD  gabapentin  (NEURONTIN ) 300 MG capsule Take 300 mg by mouth at bedtime. 06/26/19   [provider]  Guselkumab  100 MG/ML SOPN Inject 100 mg into the skin See  admin instructions. Every 8 weeks  Please resume only after talking with your primary care MD Patient not taking: Reported on 03/07/2024 07/12/20   Raenelle Donalda HERO, MD  loperamide (IMODIUM) 2 MG capsule Take 2 capsules (4 mg total) by mouth 4 (four) times daily -  before meals and at bedtime. 02/25/24   Amoako, Prince, MD  meclizine  (ANTIVERT ) 25 MG tablet Take 25 mg by mouth 3 (three) times daily as needed for dizziness.    [provider]  mirtazapine  (REMERON ) 7.5 MG tablet Take 7.5 mg by mouth daily.    [provider]  mometasone -formoterol  (DULERA ) 200-5 MCG/ACT AERO Inhale 1 puff into the lungs daily. 03/07/24   Medina-Vargas, Monina C, NP  nicotine  (NICODERM CQ  - DOSED IN MG/24 HOURS) 21 mg/24hr patch Place 1 patch (21 mg total) onto the skin daily. 03/07/24   Medina-Vargas, Monina C, NP  nicotine  polacrilex (NICORETTE) 2 MG gum Take 1 each (2 mg total) by mouth as needed for smoking cessation. Patient not taking: Reported on 03/07/2024 02/25/24   Amoako, Prince, MD  potassium chloride  SA (KLOR-CON  M) 20 MEQ tablet Take 1 tablet (20 mEq total) by mouth 2 (two) times daily. 02/25/24   Amoako, Prince, MD  psyllium (HYDROCIL/METAMUCIL) 95 % PACK Take 1 packet by mouth daily. 03/07/24   Medina-Vargas, Monina C, NP  QUEtiapine  (SEROQUEL ) 25 MG tablet Take 25 mg by mouth at bedtime.    [provider]  simethicone (MYLICON) 40 MG/0.6ML drops Take 0.6 mLs (40 mg total) by mouth 4 (four) times daily as needed for flatulence. 02/25/24   Amoako, Prince, MD  Vitamin D, Ergocalciferol, (DRISDOL) 1.25 MG (50000 UNIT) CAPS capsule Take 1 capsule (50,000 Units total) by mouth every 7 (seven) days. 03/07/24   Medina-Vargas, Monina C, NP    Allergies: Sulfa antibiotics    Review of Systems  Updated Vital Signs BP 107/66   Pulse 74   Temp (!) 97.5 F (36.4 C) (Oral)   Resp 16   Ht 5' (1.524 m)   Wt 32.7 kg   SpO2 100%   BMI 14.06 kg/m   Physical Exam Vitals and nursing  note reviewed.  Constitutional:      Appearance: She is well-developed.     Comments: Disheveled covered in stool, cachectic very thin appearing  HENT:     Head: Normocephalic and atraumatic.     Mouth/Throat:     Mouth: Mucous membranes are dry.  Eyes:     Extraocular Movements: Extraocular movements intact.     Conjunctiva/sclera: Conjunctivae normal.     Pupils: Pupils are equal, round, and reactive to light.  Cardiovascular:     Rate and Rhythm: Normal rate and regular rhythm.     Pulses: Normal pulses.     Heart sounds: Normal heart sounds. No murmur heard. Pulmonary:     Effort: Pulmonary effort is normal. No respiratory distress.     Breath sounds: Normal breath sounds.  Abdominal:     Palpations: Abdomen is soft.     Tenderness: There is abdominal tenderness.  Musculoskeletal:        General: No swelling.     Cervical back: Normal range of motion and neck supple.  Skin:    General: Skin is warm and dry.     Capillary Refill: Capillary refill takes less than 2 seconds.  Neurological:     General: No focal deficit present.     Mental Status: She is alert.     Comments: Patient moves all extremities she answers questions fairly easily but she is somewhat slow to respond  Psychiatric:        Mood and Affect: Mood normal.     (all labs ordered are listed, but only abnormal results are displayed) Labs Reviewed  CBC WITH DIFFERENTIAL/PLATELET - Abnormal; Notable for the following components:      Result Value   RBC 2.98 (*)    Hemoglobin 9.9 (*)    HCT 31.7 (*)    MCV 106.4 (*)    RDW 16.8 (*)    nRBC 3.3 (*)    All other components within normal limits  I-STAT CHEM 8, ED - Abnormal; Notable for the following components:   Potassium 8.2 (*)    Chloride 129 (*)    BUN 73 (*)    Creatinine, Ser 3.90 (*)    TCO2 14 (*)    All other components within normal limits  I-STAT CHEM 8, ED - Abnormal; Notable for the following components:   Sodium 146 (*)    Potassium  5.2 (*)    Chloride 128 (*)    BUN 52 (*)    Creatinine, Ser 3.80 (*)    Glucose, Bld 65 (*)    TCO2 13 (*)    Hemoglobin 9.9 (*)    HCT 29.0 (*)    All other components within normal limits  RESP PANEL BY RT-PCR (RSV, FLU A&B, COVID)  RVPGX2  ETHANOL  URINALYSIS, ROUTINE W REFLEX MICROSCOPIC  RAPID URINE DRUG SCREEN, HOSP PERFORMED  AMMONIA  COMPREHENSIVE METABOLIC PANEL WITH GFR  LIPASE, BLOOD  COMPREHENSIVE METABOLIC PANEL WITH GFR    EKG: EKG Interpretation Date/Time:  Tuesday March 13 2024 08:21:23 EST Ventricular Rate:  76 PR Interval:  121 QRS Duration:  114 QT Interval:  385 QTC Calculation: 433 R Axis:   86  Text Interpretation: Sinus rhythm Right atrial enlargement Confirmed by Ruthe Cornet [phone]) on 03/13/2024 8:41:37 AM  Radiology: [facility] Chest Portable 1 View Result Date: 03/13/2024 EXAM: 1 VIEW(S) XRAY OF THE CHEST 03/13/2024 09:35:00 AM COMPARISON: None available. CLINICAL HISTORY: ams FINDINGS: LUNGS AND PLEURA: No focal pulmonary opacity. No pulmonary edema. No pleural effusion. No pneumothorax. HEART AND MEDIASTINUM: No acute abnormality of the cardiac and mediastinal silhouettes. BONES AND SOFT TISSUES: No acute osseous abnormality. IMPRESSION: 1. No acute cardiopulmonary abnormality. Electronically signed by: Rogelia Myers MD 03/13/2024 09:56 AM EST RP Workstation: [id number]   CT ABDOMEN PELVIS WO CONTRAST Result Date: 03/13/2024 CLINICAL DATA:  Abdominal pain, acute, nonlocalized Altered mental status for 5 days. Recently hospitalized for pneumonia. Decreased oral intake. EXAM: CT ABDOMEN AND PELVIS WITHOUT CONTRAST TECHNIQUE: Multidetector CT imaging of the abdomen and pelvis was performed following the standard protocol without IV contrast. RADIATION DOSE REDUCTION: This exam was performed according to the departmental dose-optimization program which includes automated exposure control, adjustment of the mA and/or kV according to patient size and/or  use of iterative reconstruction technique. COMPARISON:  Abdominopelvic CT 02/18/2024 and 07/08/2020. FINDINGS: Technical note: There is significant beam hardening artifact related to a bracelet on the right wrist. This limits assessment of the right lower chest and liver. Lower chest: Clear lung bases. No significant pleural or pericardial effusion. Hepatobiliary: Allowing for the artifact, the liver appears stable, without acute or focal abnormality on noncontrast imaging. No evidence of gallstones, gallbladder wall thickening or biliary dilatation. Pancreas: Unremarkable. No pancreatic ductal dilatation or surrounding inflammatory changes. Spleen: Normal in size without focal abnormality. Adrenals/Urinary Tract: Both adrenal glands appear normal. Unchanged bilateral nephrolithiasis, measuring up to 1.1 cm in the lower pole of the left kidney. No evidence of ureteral calculus or hydronephrosis. There is mild renal cortical scarring bilaterally. The bladder appears unremarkable for its degree of distention. Stomach/Bowel: No enteric contrast administered. The stomach appears unremarkable for its degree of distension. No evidence of bowel wall thickening, distention or surrounding inflammatory change. Retrocecal surgical clips consistent with prior appendectomy. Prominent liquid stool in the distal colon. Vascular/Lymphatic: No enlarged abdominopelvic lymph nodes are identified. Mild aortoiliac atherosclerosis without evidence of aneurysm. Reproductive: Status post hysterectomy. No adnexal mass. Air in the vagina. Other: No evidence of abdominal wall mass or hernia. No ascites or pneumoperitoneum. Musculoskeletal: No acute or significant osseous findings. Mild lower lumbar facet arthropathy. IMPRESSION: 1. No acute findings or explanation for the patient's symptoms. 2. The visualized lung bases no appear clear, without significant residual right lower lobe bronchopneumonia. 3. Prominent liquid stool in the distal  colon suggesting diarrheal state. No evidence of bowel wall thickening or surrounding inflammation. 4. Unchanged bilateral nephrolithiasis. No evidence of ureteral calculus or hydronephrosis. 5.  Aortic Atherosclerosis (ICD10-I70.0). Electronically Signed   By: Elsie Perone M.D.   On: 03/13/2024 09:20   CT Head Wo Contrast Result Date: 03/13/2024 EXAM: CT HEAD WITHOUT CONTRAST 03/13/2024 08:48:00 AM TECHNIQUE: CT of the head was performed without the administration of intravenous contrast. Automated exposure control, iterative reconstruction, and/or weight based adjustment of the  mA/kV was utilized to reduce the radiation dose to as low as reasonably achievable. COMPARISON: Brain MRI 11/15/2011. Head CT 02/18/2024. CLINICAL HISTORY: 68 year old female with altered mental status, recent pneumonia, and decreased oral intake. FINDINGS: BRAIN AND VENTRICLES: No acute hemorrhage. No evidence of acute infarct. No hydrocephalus. No extra-axial collection. No mass effect or midline shift. Brain volume is stable within normal limits for age. Gray white differentiation is stable, within normal limits for age. Mild for age calcified atherosclerosis at the skull base. No suspicious intracranial vascular hyperdensity. ORBITS: No acute abnormality. SINUSES: Paranasal sinuses, middle ears and mastoids are clear. SOFT TISSUES AND SKULL: No acute soft tissue abnormality. No skull fracture. IMPRESSION: 1. Normal for age non-contrast head CT appearance of the brain. Electronically signed by: Helayne Hurst MD 03/13/2024 08:59 AM EST RP Workstation: [id number]     Procedures   Medications Ordered in the ED  dextrose  10 % infusion ( Intravenous New Bag/Given 03/13/24 1216)  sodium chloride  0.9 % bolus 1,000 mL (0 mLs Intravenous Stopped 03/13/24 1051)  sodium zirconium cyclosilicate (LOKELMA) packet 10 g (10 g Oral Given 03/13/24 1021)  calcium gluconate 1 g/ 50 mL sodium chloride  IVPB (0 mg Intravenous Stopped 03/13/24  1051)                                    Medical Decision Making Amount and/or Complexity of Data Reviewed Labs: ordered. Radiology: ordered.  Risk Prescription drug management.   Jerel JONETTA Fontana is here with generalized weakness altered mental status.  History of depression anxiety CKD.  Overall appears cachectic dehydrated disheveled.  Covered in stool.  Overall failure to thrive type picture.  Will get CT head CT abdomen and pelvis chest x-ray urinalysis basic labs will give IV fluids.  Just recently admitted further somewhat similar presentation.  Will talk with family.  History gotten from EMS.  She can tell me her name and where she is but overall she is chronically ill-appearing.  Differential is wide could be infectious process dehydration failure to thrive electrolyte abnormality seems less likely to be stroke or significant intra-abdominal process.  Lab work significant for AKI again at 3.8.  Potassium on repeat is 5.2.  Blood sugar 65.  She is on D10 infusion.  There is no significant leukocytosis or anemia.  Head CT abdominal CT is unremarkable.  Chest x-ray with no evidence of pneumonia.  Ongoing diarrheal changes to CT scan otherwise.  Overall we will admit for further hydration and AKI supportive care.  This chart was dictated using voice recognition software.  Despite best efforts to proofread,  errors can occur which can change the documentation meaning.      Final diagnoses:  AKI (acute kidney injury)  Failure to thrive in adult    ED Discharge Orders     None          Ruthe Cornet, DO 03/13/24 1224

## 2024-03-13 NOTE — Hospital Course (Addendum)
  Denied pain totally, plan is go home today.  Plan: start Creon  low dose and check if she can tolerate.           11/18 Patient is feeling better, oriented to herself and place but not date. Has constant problem with diarrhea, denied dysuria  P/E: mild tenderness on suprapubic area  [facility], you are shining:)     #Syncopal episode #Generalized weakness #Chronic Diarrhea with interval worsening Underwent EGD on 11/15. Tolerated the procedure well.  Esophageal plaques found, suspicious for candidiasis-biopsied.  GI started patient on fluconazole . Benign-appearing esophageal stenosis.  Erosive gastropathy, with no recent bleeds, biopsied.  3 duodenal polyps, resected and retrieved. Will continue CLD and await on pathology results. Will wait for GI to perform CSY whenever pt is ready for further evaluation of diarrhea.   - Pending CSY - Underwent EGD 11/15 -Started fluconazole  200 mg daily for suspicion for candidiasis -Path results from esophagus, duodenum pending -GI on board, appreciate recommendations  - Resume loperamide  4 mg TID  scheduled  -psyllium daily  - Watch for refeeding syndrome, monitor mg, phosphorus  - Orthostatic vitals - PT/OT -repleting several vitamins   #NAGMA #Chronic Diarrhea Anion gap elevated likely 2/2 to diarrhea as her loperamide  was paused for EGD till yesterday. Potassium levels wnl. Will be monitored and repleted as needed. .   -sodium bicarbonate  tablet 1,300 mg PO TID -Daily RFPs -Loperamide , as above    #Acute on chronic kidney injury # CKD 4 Creatinine improving. Likely pre-renal due to low intake and volume depletion. Continue with PO fluids and encourage adequate oral intake   - Trend serum creatinine   #Thyroid  nodule US  of thyroid  performed  which revealed several low risk nodules in the left mid-lower gland that do not meet the criteria for biopsy or imaging surveillance.    Chronic Conditions:    #Asthma - Breo  ellipta daily   #Depression #Impaired cognition due to medical illness vs dementia -  Continue  Mirtazapine      #GERD - Continue on home p.o. pantoprazole  40 mg   #Tobacco use disorder - Nicotine  patch daily   #Lower extremity psoriasis The patient self-administers Tremfya  1 mg every 8 weeks. She also uses betamethasone  dipropionate 0.05% topically as needed for her legs.   #Vertigo Continue to hold meclizine  25 mg 3 times daily as needed.   #Peripheral neuropathy - Continuing home gabapentine    11/17: She states that she is feeling okay this morning. No nausea. She states that she is still having some diarrhea. The boyfriend at bedside states that is has slowed down a little bit. She did not eat breakfast. She does not that she has been drinking ensure. Patient lives alone but sometimes her boyfriend is with her. She does do her own grocery shopping and does sometimes drives. Over the last 3 of 4 months she has been having a lot of diarehea.

## 2024-03-13 NOTE — ED Notes (Addendum)
 Pt having difficulty keeping arm straight.  Pt constantly redirected.

## 2024-03-13 NOTE — ED Notes (Signed)
 Patient transported to CT

## 2024-03-13 NOTE — ED Notes (Signed)
 Pt has been unsuccessfully stuck numerous times for additional lines and labs by different ED staff.

## 2024-03-13 NOTE — ED Triage Notes (Signed)
 Per EMS, Pt, from home, presents d/t AMS x5 days.  EMS reported Pt was recently admitted for PNA.  Pt c/o abdominal pain.    Family reports decreased PO intake.

## 2024-03-14 ENCOUNTER — Observation Stay (HOSPITAL_COMMUNITY)

## 2024-03-14 DIAGNOSIS — F0393 Unspecified dementia, unspecified severity, with mood disturbance: Secondary | ICD-10-CM | POA: Diagnosis present

## 2024-03-14 DIAGNOSIS — K8689 Other specified diseases of pancreas: Secondary | ICD-10-CM | POA: Diagnosis present

## 2024-03-14 DIAGNOSIS — N183 Chronic kidney disease, stage 3 unspecified: Secondary | ICD-10-CM | POA: Diagnosis not present

## 2024-03-14 DIAGNOSIS — R55 Syncope and collapse: Secondary | ICD-10-CM | POA: Diagnosis not present

## 2024-03-14 DIAGNOSIS — R627 Adult failure to thrive: Secondary | ICD-10-CM | POA: Diagnosis present

## 2024-03-14 DIAGNOSIS — E43 Unspecified severe protein-calorie malnutrition: Secondary | ICD-10-CM | POA: Diagnosis present

## 2024-03-14 DIAGNOSIS — Z7189 Other specified counseling: Secondary | ICD-10-CM | POA: Diagnosis not present

## 2024-03-14 DIAGNOSIS — N179 Acute kidney failure, unspecified: Secondary | ICD-10-CM | POA: Diagnosis present

## 2024-03-14 DIAGNOSIS — Z515 Encounter for palliative care: Secondary | ICD-10-CM | POA: Diagnosis not present

## 2024-03-14 DIAGNOSIS — K909 Intestinal malabsorption, unspecified: Secondary | ICD-10-CM | POA: Diagnosis not present

## 2024-03-14 DIAGNOSIS — K2289 Other specified disease of esophagus: Secondary | ICD-10-CM | POA: Diagnosis not present

## 2024-03-14 DIAGNOSIS — L4050 Arthropathic psoriasis, unspecified: Secondary | ICD-10-CM | POA: Diagnosis present

## 2024-03-14 DIAGNOSIS — G9341 Metabolic encephalopathy: Secondary | ICD-10-CM | POA: Diagnosis present

## 2024-03-14 DIAGNOSIS — E872 Acidosis, unspecified: Secondary | ICD-10-CM | POA: Diagnosis present

## 2024-03-14 DIAGNOSIS — R630 Anorexia: Secondary | ICD-10-CM | POA: Diagnosis not present

## 2024-03-14 DIAGNOSIS — K229 Disease of esophagus, unspecified: Secondary | ICD-10-CM | POA: Diagnosis not present

## 2024-03-14 DIAGNOSIS — I129 Hypertensive chronic kidney disease with stage 1 through stage 4 chronic kidney disease, or unspecified chronic kidney disease: Secondary | ICD-10-CM | POA: Diagnosis present

## 2024-03-14 DIAGNOSIS — E8721 Acute metabolic acidosis: Secondary | ICD-10-CM | POA: Diagnosis not present

## 2024-03-14 DIAGNOSIS — Z1152 Encounter for screening for COVID-19: Secondary | ICD-10-CM | POA: Diagnosis not present

## 2024-03-14 DIAGNOSIS — K529 Noninfective gastroenteritis and colitis, unspecified: Secondary | ICD-10-CM | POA: Diagnosis present

## 2024-03-14 DIAGNOSIS — N184 Chronic kidney disease, stage 4 (severe): Secondary | ICD-10-CM | POA: Diagnosis present

## 2024-03-14 DIAGNOSIS — D631 Anemia in chronic kidney disease: Secondary | ICD-10-CM | POA: Diagnosis present

## 2024-03-14 DIAGNOSIS — L89312 Pressure ulcer of right buttock, stage 2: Secondary | ICD-10-CM | POA: Diagnosis present

## 2024-03-14 DIAGNOSIS — F32A Depression, unspecified: Secondary | ICD-10-CM | POA: Diagnosis present

## 2024-03-14 DIAGNOSIS — K317 Polyp of stomach and duodenum: Secondary | ICD-10-CM | POA: Diagnosis not present

## 2024-03-14 DIAGNOSIS — E8809 Other disorders of plasma-protein metabolism, not elsewhere classified: Secondary | ICD-10-CM

## 2024-03-14 DIAGNOSIS — F0394 Unspecified dementia, unspecified severity, with anxiety: Secondary | ICD-10-CM | POA: Diagnosis present

## 2024-03-14 DIAGNOSIS — I700 Atherosclerosis of aorta: Secondary | ICD-10-CM | POA: Diagnosis present

## 2024-03-14 DIAGNOSIS — R531 Weakness: Secondary | ICD-10-CM | POA: Diagnosis present

## 2024-03-14 DIAGNOSIS — B3781 Candidal esophagitis: Secondary | ICD-10-CM | POA: Diagnosis present

## 2024-03-14 DIAGNOSIS — Z681 Body mass index (BMI) 19 or less, adult: Secondary | ICD-10-CM | POA: Diagnosis not present

## 2024-03-14 DIAGNOSIS — R64 Cachexia: Secondary | ICD-10-CM | POA: Diagnosis present

## 2024-03-14 DIAGNOSIS — E860 Dehydration: Secondary | ICD-10-CM | POA: Diagnosis present

## 2024-03-14 DIAGNOSIS — N390 Urinary tract infection, site not specified: Secondary | ICD-10-CM | POA: Diagnosis not present

## 2024-03-14 DIAGNOSIS — E042 Nontoxic multinodular goiter: Secondary | ICD-10-CM | POA: Diagnosis present

## 2024-03-14 LAB — CBC
HCT: 23.5 % — ABNORMAL LOW (ref 36.0–46.0)
Hemoglobin: 7.5 g/dL — ABNORMAL LOW (ref 12.0–15.0)
MCH: 33.3 pg (ref 26.0–34.0)
MCHC: 31.9 g/dL (ref 30.0–36.0)
MCV: 104.4 fL — ABNORMAL HIGH (ref 80.0–100.0)
Platelets: 309 K/uL (ref 150–400)
RBC: 2.25 MIL/uL — ABNORMAL LOW (ref 3.87–5.11)
RDW: 16.5 % — ABNORMAL HIGH (ref 11.5–15.5)
WBC: 8.1 K/uL (ref 4.0–10.5)
nRBC: 2.8 % — ABNORMAL HIGH (ref 0.0–0.2)

## 2024-03-14 LAB — BASIC METABOLIC PANEL WITH GFR
Anion gap: 11 (ref 5–15)
BUN: 34 mg/dL — ABNORMAL HIGH (ref 8–23)
CO2: 12 mmol/L — ABNORMAL LOW (ref 22–32)
Calcium: 8.5 mg/dL — ABNORMAL LOW (ref 8.9–10.3)
Chloride: 120 mmol/L — ABNORMAL HIGH (ref 98–111)
Creatinine, Ser: 2.8 mg/dL — ABNORMAL HIGH (ref 0.44–1.00)
GFR, Estimated: 18 mL/min — ABNORMAL LOW (ref >60)
Glucose, Bld: 99 mg/dL (ref 70–99)
Potassium: 3.4 mmol/L — ABNORMAL LOW (ref 3.5–5.1)
Sodium: 143 mmol/L (ref 135–145)

## 2024-03-14 LAB — MAGNESIUM: Magnesium: 1.8 mg/dL (ref 1.7–2.4)

## 2024-03-14 LAB — PHOSPHORUS: Phosphorus: 3.4 mg/dL (ref 2.5–4.6)

## 2024-03-14 MED ORDER — VITAMIN A 3 MG (10000 UNIT) PO CAPS
10000.0000 [IU] | ORAL_CAPSULE | Freq: Every day | ORAL | Status: AC
  Administered 2024-03-14 – 2024-03-23 (×7): 10000 [IU] via ORAL
  Filled 2024-03-14 (×10): qty 1

## 2024-03-14 MED ORDER — VITAMIN B-12 1000 MCG PO TABS
1000.0000 ug | ORAL_TABLET | Freq: Every day | ORAL | Status: DC
  Administered 2024-03-14 – 2024-03-17 (×4): 1000 ug via ORAL
  Filled 2024-03-14 (×6): qty 1

## 2024-03-14 MED ORDER — LACTATED RINGERS IV SOLN: INTRAVENOUS | Status: DC

## 2024-03-14 MED ORDER — VITAMIN D 25 MCG (1000 UNIT) PO TABS
1000.0000 [IU] | ORAL_TABLET | Freq: Every day | ORAL | Status: DC
  Administered 2024-03-14 – 2024-03-17 (×4): 1000 [IU] via ORAL
  Filled 2024-03-14 (×7): qty 1

## 2024-03-14 MED ORDER — VITAMIN D (ERGOCALCIFEROL) 1.25 MG (50000 UNIT) PO CAPS
50000.0000 [IU] | ORAL_CAPSULE | ORAL | Status: DC
  Administered 2024-03-14: 50000 [IU] via ORAL
  Filled 2024-03-14: qty 1

## 2024-03-14 MED ORDER — GERHARDT'S BUTT CREAM
TOPICAL_CREAM | Freq: Two times a day (BID) | CUTANEOUS | Status: DC
  Administered 2024-03-15 – 2024-03-23 (×4): 1 via TOPICAL
  Filled 2024-03-14: qty 60

## 2024-03-14 MED ORDER — POTASSIUM CITRATE ER 10 MEQ (1080 MG) PO TBCR
20.0000 meq | EXTENDED_RELEASE_TABLET | Freq: Once | ORAL | Status: AC
  Administered 2024-03-14: 20 meq via ORAL
  Filled 2024-03-14: qty 2

## 2024-03-14 MED ORDER — FLUTICASONE FUROATE-VILANTEROL 200-25 MCG/ACT IN AEPB
1.0000 | INHALATION_SPRAY | Freq: Every day | RESPIRATORY_TRACT | Status: DC
  Administered 2024-03-15 – 2024-03-26 (×11): 1 via RESPIRATORY_TRACT
  Filled 2024-03-14: qty 28

## 2024-03-14 MED ORDER — MIRTAZAPINE 15 MG PO TABS
7.5000 mg | ORAL_TABLET | Freq: Every day | ORAL | Status: DC
  Administered 2024-03-14 – 2024-03-26 (×10): 7.5 mg via ORAL
  Filled 2024-03-14 (×13): qty 1

## 2024-03-14 MED ORDER — DEXTROSE IN LACTATED RINGERS 5 % IV SOLN: INTRAVENOUS | Status: AC

## 2024-03-14 MED ORDER — THIAMINE MONONITRATE 100 MG PO TABS
100.0000 mg | ORAL_TABLET | Freq: Every day | ORAL | Status: DC
  Administered 2024-03-14 – 2024-03-17 (×4): 100 mg via ORAL
  Filled 2024-03-14 (×7): qty 1

## 2024-03-14 MED ORDER — POTASSIUM CHLORIDE 20 MEQ PO PACK: 20.0000 meq | PACK | Freq: Once | ORAL | Status: DC

## 2024-03-14 NOTE — Consult Note (Signed)
 Consultation Note   Referring Provider:   Teaching Service PCP: Phyllis Jereld BROCKS, NP Primary Gastroenterologist: [place]       Reason for Consultation: Chronic diarrhea, failure to thrive DOA: 03/13/2024         Hospital Day: 2   ASSESSMENT    68 yo female admitted weakness, with failure to thrive , reported worsening of chronic loose stool.  Patient confused, cannot provide history. At baseline she has averages ~4 loose BMs a day per boyfriend and this has recently increased to 6-7 a day.  Unclear why the chronic loose stool or why it has reportedly gotten worse. She gives a remote history of an intestinal surgery but details not available.  Infectious process seems unlikely since C-diff , GI path panel during October admission were negative. No acute findings on CT AP but it was without PO / IV contrast so very limited study.  Given history of autoimmune disease ( psoriatic arthritis) need to consider presence of other autoimmune diseases such as celiac, IBD. Also consider microscopic colitis. Pancreatic insuff possible though diarrhea doesn't seem meal driven.   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 It has fluctuated vastly over the last month in setting of volume depletion, iv hydration, lab draws, illness, ect. Doesn't appear iron deficient  Occasional small volume rectal bleeding with BMs Patient confused so history unreliable  Chronic GERD Continue home pantoprazole   Psoriatic arthritis Guselkumab  on home med list but apparently not taking it  Thyroid  nodules.  US  today shows several low risk nodule not warranting biopsy or surveillance  See PMH for any additional medical history  / medical problems  Principal Problem:   Syncope Active Problems:   Dizziness   Gastroesophageal reflux disease   CKD (chronic kidney disease) stage 3, GFR 30-59 ml/min (HCC)   Protein-calorie  malnutrition, severe   Acute renal failure   Diarrhea   Weakness generalized   Pressure injury of skin   AKI (acute kidney injury)   PLAN:   --O+P and fecal elastase are pending -- Will add fecal calprotectin -- inflammatory markers would probably not be helpful since she has psoriatic arthritis so held off on checking.  --Resume Remeron  for appetite?  --Palliative Care to see. Depending on clinical course will consider colonoscopy with random biopsies and maybe EGD as well for decrease appetite, weight loss.     HPI   Brief History:   Patient was recently discharged from the hospital after an admission for syncope,  AKI, multifocal pneumonia.  Cardiac and Neurologic evaluations were unrevealing.  AKI was felt to be prerenal in nature, related to diarrhea from ongoing gastrointestinal losses in the setting of chronic diarrhea.  C. difficile and GI pathogen panel were negative.    Interval History:   Patient is confused, unable to provide a history. She is not confused at baseline per boyfriend of 4 years.   History comes from boyfriend of 4 years and the chart  Patient reportedly has chronic loose stool ( for at least 4 years per boyfriend which is how long he has known her. ). She had some sort of intestinal surgery many years ago  but she nor boyfriend  can give any details other than it was to possible repair a bowel injury occurred during some surgery. Sounds like she has never had a colonoscopy. Typically patient has 4 BMs a day. About three weeks ago ( prior to recent hospitalization) she began having 5-6 loose BMs a day. More recently she has been going 6-7 times a day. Having frequent nocturnal diarrhea. She reports sometimes seeing blood in stool, boyfriend unable to confirm this. Not taking anything at home to help with the diarrhea.  Boyfriend feels her health started declining in July 2024 when hospitalized with pneumonia. Since then she has been slowly losing weight and he  guesses about 50 pounds. Her appetite had improved following last hospitalization as she was started on Remeron  but then appetite declined again ( ? Did she continue the Remeron ? )   Patient endorses nausea without vomiting, boyfriend wasn't aware of any nausea. She has no dysphagia.                 Labs and Imaging:  Recent Labs    03/13/24 1155 03/13/24 1737  PROT 6.0* 6.1*  ALBUMIN 2.5* 2.6*  AST 16 19  ALT 15 16  ALKPHOS 74 72  BILITOT 0.5 0.6   Recent Labs    03/13/24 0830 03/13/24 0916 03/13/24 1202 03/14/24 0442  WBC 9.4  --   --  8.1  HGB 9.9* 12.2 9.9* 7.5*  HCT 31.7* 36.0 29.0* 23.5*  MCV 106.4*  --   --  104.4*  PLT 394  --   --  309   Recent Labs    03/13/24 1155 03/13/24 1202 03/13/24 1737 03/14/24 0442  NA 143 146* 142 143  K 4.8 5.2* 4.1 3.4*  CL 126* 128* 121* 120*  CO2 11*  --  11* 12*  GLUCOSE 73 65* 131* 99  BUN 42* 52* 40* 34*  CREATININE 3.38* 3.80* 3.26* 2.80*  CALCIUM 9.2  --  8.8* 8.5*     DG Chest Portable 1 View EXAM: 1 VIEW(S) XRAY OF THE CHEST 03/13/2024 09:35:00 AM  COMPARISON: None available.  CLINICAL HISTORY: ams  FINDINGS:  LUNGS AND PLEURA: No focal pulmonary opacity. No pulmonary edema. No pleural effusion. No pneumothorax.  HEART AND MEDIASTINUM: No acute abnormality of the cardiac and mediastinal silhouettes.  BONES AND SOFT TISSUES: No acute osseous abnormality.  IMPRESSION: 1. No acute cardiopulmonary abnormality.  Electronically signed by: Rogelia Myers MD 03/13/2024 09:56 AM EST RP Workstation: [id number] CT ABDOMEN PELVIS WO CONTRAST CLINICAL DATA:  Abdominal pain, acute, nonlocalized  Altered mental status for 5 days. Recently hospitalized for pneumonia. Decreased oral intake.  EXAM: CT ABDOMEN AND PELVIS WITHOUT CONTRAST  TECHNIQUE: Multidetector CT imaging of the abdomen and pelvis was performed following the standard protocol without IV contrast.  RADIATION DOSE REDUCTION: This exam  was performed according to the departmental dose-optimization program which includes automated exposure control, adjustment of the mA and/or kV according to patient size and/or use of iterative reconstruction technique.  COMPARISON:  Abdominopelvic CT 02/18/2024 and 07/08/2020.  FINDINGS: Technical note: There is significant beam hardening artifact related to a bracelet on the right wrist. This limits assessment of the right lower chest and liver.  Lower chest: Clear lung bases. No significant pleural or pericardial effusion.  Hepatobiliary: Allowing for the artifact, the liver appears stable, without acute or focal abnormality on noncontrast imaging. No evidence of gallstones, gallbladder wall thickening or biliary dilatation.  Pancreas: Unremarkable. No pancreatic ductal dilatation or surrounding inflammatory changes.  Spleen: Normal in size without focal abnormality.  Adrenals/Urinary Tract: Both adrenal glands appear normal. Unchanged bilateral nephrolithiasis, measuring up to 1.1 cm in the lower pole of the left kidney. No evidence of ureteral calculus or hydronephrosis. There is mild renal cortical scarring bilaterally. The bladder appears unremarkable for its degree of distention.  Stomach/Bowel: No enteric contrast administered. The stomach appears unremarkable for its degree of distension. No evidence of bowel wall thickening, distention or surrounding inflammatory change. Retrocecal surgical clips consistent with prior appendectomy. Prominent liquid stool in the distal colon.  Vascular/Lymphatic: No enlarged abdominopelvic lymph nodes are identified. Mild aortoiliac atherosclerosis without evidence of aneurysm.  Reproductive: Status post hysterectomy. No adnexal mass. Air in the vagina.  Other: No evidence of abdominal wall mass or hernia. No ascites or pneumoperitoneum.  Musculoskeletal: No acute or significant osseous findings. Mild lower lumbar facet  arthropathy.  IMPRESSION: 1. No acute findings or explanation for the patient's symptoms. 2. The visualized lung bases no appear clear, without significant residual right lower lobe bronchopneumonia. 3. Prominent liquid stool in the distal colon suggesting diarrheal state. No evidence of bowel wall thickening or surrounding inflammation. 4. Unchanged bilateral nephrolithiasis. No evidence of ureteral calculus or hydronephrosis. 5.  Aortic Atherosclerosis (ICD10-I70.0).  Electronically Signed   By: Elsie Perone M.D.   On: 03/13/2024 09:20 CT Head Wo Contrast EXAM: CT HEAD WITHOUT CONTRAST 03/13/2024 08:48:00 AM  TECHNIQUE: CT of the head was performed without the administration of intravenous contrast. Automated exposure control, iterative reconstruction, and/or weight based adjustment of the mA/kV was utilized to reduce the radiation dose to as low as reasonably achievable.  COMPARISON: Brain MRI 11/15/2011. Head CT 02/18/2024.  CLINICAL HISTORY: 68 year old female with altered mental status, recent pneumonia, and decreased oral intake.  FINDINGS:  BRAIN AND VENTRICLES: No acute hemorrhage. No evidence of acute infarct. No hydrocephalus. No extra-axial collection. No mass effect or midline shift. Brain volume is stable within normal limits for age. Gray white differentiation is stable, within normal limits for age. Mild for age calcified atherosclerosis at the skull base. No suspicious intracranial vascular hyperdensity.  ORBITS: No acute abnormality.  SINUSES: Paranasal sinuses, middle ears and mastoids are clear.  SOFT TISSUES AND SKULL: No acute soft tissue abnormality. No skull fracture.  IMPRESSION: 1. Normal for age non-contrast head CT appearance of the brain.  Electronically signed by: Helayne Hurst MD 03/13/2024 08:59 AM EST RP Workstation: [id number]     Past Medical History:  Diagnosis Date   Anxiety    Arthritis    shoulders and hands    Asthma    Benign positional vertigo    Chronically dry eyes    CKD (chronic kidney disease), stage III (HCC)    Depression    Dyspnea on exertion    GERD (gastroesophageal reflux disease)    Hematuria    History of acute pyelonephritis    History of Bell's palsy    1980's left side-- residual lip numbness intermittant   History of chest pain    non-cardiac per dr claudene notes (pt's pcp)   History of panic attacks    Hypertension    Left ureteral stone    Loose bowel movements    chronic-per pt residual from injury during abdominal surgery in 1986   Migraines    Nephrolithiasis    left    Psoriasis    Urgency of urination    Wears dentures    has upper and lower but only wears upper  Past Surgical History:  Procedure Laterality Date   BREAST CYST EXCISION  12/15/2011   Procedure: CYST EXCISION BREAST;  Surgeon: Vicenta DELENA Poli, MD;  Location: [facility];  Service: General;  Laterality: Left;  Excision of a Chronic Left Breast Sebaceous Cyst   CARDIOVASCULAR STRESS TEST  12-28-2012   Low risk nuclear study w/ small LV cavity which may be due LVH/  no ischemia or infarct/  normal wall motion, ef 67%   CYSTOSCOPY WITH STENT PLACEMENT Left 09/08/2015   Procedure: CYSTOSCOPY WITH LEFT STENT PLACEMENT;  Surgeon: Norleen Seltzer, MD;  Location: [facility];  Service: Urology;  Laterality: Left;   CYSTOSCOPY WITH URETEROSCOPY AND STENT PLACEMENT Left 09/25/2015   Procedure: LEFT  URETEROSCOPY STONE EXTRACTION;  Surgeon: Norleen Seltzer, MD;  Location[facility];  Service: Urology;  Laterality: Left;   EXCISION AND REVISION SCAR ABDOMINAL WALL  01-22-2002   EXPLORATORY LAPAROTOMY  1986   w/ Abdominal Myomectomy  (per pt bowel muscle cut (injury))   HOLMIUM LASER APPLICATION Left 09/25/2015   Procedure: HOLMIUM LASER APPLICATION;  Surgeon: Norleen Seltzer, MD;  Location: [facility];  Service: Urology;  Laterality: Left;   I & D LEFT BUTTOCK ABSCESS   07-01-2010   x2 area's    NEGATIVE SLEEP STUDY  08-10-2005   SHOULDER ARTHROSCOPY WITH SUBACROMIAL DECOMPRESSION, ROTATOR CUFF REPAIR AND BICEP TENDON REPAIR Right 11/12/2021   Procedure: SHOULDER ARTHROSCOPY WITH SUBACROMIAL DECOMPRESSION, ROTATOR CUFF REPAIR AND BICEP TENDON REPAIR;  Surgeon: Cristy Bonner DASEN, MD;  Location: [facility];  Service: Orthopedics;  Laterality: Right;   TRANSTHORACIC ECHOCARDIOGRAM  12-28-2012   grade 1 diastolic dysfunction, mild concentric LVH, ef 65-70%/  mild MR/  trivial PR and TR    Family History  Problem Relation Age of Onset   Cancer Mother        Stomach   Cancer Father        Unknown    Prior to Admission medications   Medication Sig Start Date End Date Taking? Authorizing Provider  albuterol  (VENTOLIN  HFA) 108 (90 Base) MCG/ACT inhaler Inhale 2 puffs into the lungs every 6 (six) hours as needed for wheezing or shortness of breath. 03/07/24  Yes Medina-Vargas, Monina C, NP  augmented betamethasone dipropionate (DIPROLENE-AF) 0.05 % ointment Apply topically 2 (two) times daily as needed. Patient taking differently: Apply 1 Application topically 2 (two) times daily as needed (irritation). 03/07/24  Yes Medina-Vargas, Monina C, NP  folic acid (FOLVITE) 1 MG tablet Take 1 tablet (1 mg total) by mouth daily. 02/26/24  Yes Amoako, Prince, MD  loperamide (IMODIUM) 2 MG capsule Take 2 capsules (4 mg total) by mouth 4 (four) times daily -  before meals and at bedtime. 02/25/24  Yes Amoako, Prince, MD  meclizine  (ANTIVERT ) 25 MG tablet Take 25 mg by mouth 3 (three) times daily as needed for dizziness.   Yes [provider]  mirtazapine  (REMERON ) 7.5 MG tablet Take 7.5 mg by mouth daily.   Yes [provider]  potassium chloride  SA (KLOR-CON  M) 20 MEQ tablet Take 1 tablet (20 mEq total) by mouth 2 (two) times daily. 02/25/24  Yes Amoako, Prince, MD  simethicone (MYLICON) 40 MG/0.6ML drops Take 0.6 mLs (40 mg total) by mouth 4 (four) times daily as needed for  flatulence. 02/25/24  Yes Amoako, Prince, MD  Vitamin D, Ergocalciferol, (DRISDOL) 1.25 MG (50000 UNIT) CAPS capsule Take 1 capsule (50,000 Units total) by mouth every 7 (seven) days. 03/07/24  Yes Medina-Vargas, Monina C, NP  cyanocobalamin 1000 MCG tablet Take 1 tablet (1,000 mcg total) by mouth daily. Patient not taking: Reported on 03/13/2024 02/26/24   Amoako, Prince, MD  Guselkumab  100 MG/ML SOPN Inject 100 mg into the skin See admin instructions. Every 8 weeks  Please resume only after talking with your primary care MD Patient not taking: No sig reported 07/12/20   Raenelle Donalda HERO, MD  mometasone -formoterol  (DULERA ) 200-5 MCG/ACT AERO Inhale 1 puff into the lungs daily. Patient not taking: Reported on 03/13/2024 03/07/24   Medina-Vargas, Monina C, NP  nicotine  (NICODERM CQ  - DOSED IN MG/24 HOURS) 21 mg/24hr patch Place 1 patch (21 mg total) onto the skin daily. Patient not taking: Reported on 03/13/2024 03/07/24   Medina-Vargas, Monina C, NP  nicotine  polacrilex (NICORETTE) 2 MG gum Take 1 each (2 mg total) by mouth as needed for smoking cessation. Patient not taking: No sig reported 02/25/24   Renne Homans, MD  psyllium (HYDROCIL/METAMUCIL) 95 % PACK Take 1 packet by mouth daily. Patient not taking: Reported on 03/13/2024 03/07/24   Medina-Vargas, Monina C, NP    Current Facility-Administered Medications  Medication Dose Route Frequency Provider Last Rate Last Admin   acetaminophen  (TYLENOL ) tablet 650 mg  650 mg Oral Q4H PRN Amoako, Prince, MD       cholecalciferol (VITAMIN D3) 25 MCG (1000 UNIT) tablet 1,000 Units  1,000 Units Oral Daily Syeda, Raeeha, DO   1,000 Units at 03/14/24 1016   cyanocobalamin (VITAMIN B12) tablet 1,000 mcg  1,000 mcg Oral Daily Syeda, Raeeha, DO   1,000 mcg at 03/14/24 1015   dextrose  5 % in lactated ringers  infusion   Intravenous Continuous Syeda, Raeeha, DO 75 mL/hr at 03/14/24 1015 New Bag at 03/14/24 1015   feeding supplement (ENSURE PLUS HIGH PROTEIN)  liquid 237 mL  237 mL Oral BID BM Amoako, Prince, MD   237 mL at 03/14/24 1037   [START ON 03/15/2024] fluticasone furoate-vilanterol (BREO ELLIPTA) 200-25 MCG/ACT 1 puff  1 puff Inhalation Daily Gomez-Caraballo, Maria, MD       folic acid (FOLVITE) tablet 1 mg  1 mg Oral Daily Amoako, Prince, MD   1 mg at 03/14/24 1016   Gerhardt's butt cream   Topical BID Francesco Elsie NOVAK, MD   Given at 03/14/24 1017   heparin  injection 5,000 Units  5,000 Units Subcutaneous Q8H Amoako, Prince, MD   5,000 Units at 03/14/24 9350   loperamide (IMODIUM) capsule 4 mg  4 mg Oral TID AC & HS Amoako, Prince, MD   4 mg at 03/14/24 1015   multivitamin with minerals tablet 1 tablet  1 tablet Oral Daily Amoako, Prince, MD   1 tablet at 03/14/24 1015   nicotine  (NICODERM CQ  - dosed in mg/24 hours) patch 14 mg  14 mg Transdermal Daily Amoako, Prince, MD   14 mg at 03/14/24 1017   Oral care mouth rinse  15 mL Mouth Rinse PRN Francesco Elsie NOVAK, MD       psyllium (HYDROCIL/METAMUCIL) 1 packet  1 packet Oral Daily Amoako, Prince, MD   1 packet at 03/14/24 1018   thiamine (VITAMIN B1) tablet 100 mg  100 mg Oral Daily Francesco Elsie NOVAK, MD   100 mg at 03/14/24 1015   vitamin A capsule 10,000 Units  10,000 Units Oral Daily Syeda, Raeeha, DO   10,000 Units at 03/14/24 1044   Vitamin D (Ergocalciferol) (DRISDOL) 1.25 MG (50000 UNIT) capsule 50,000 Units  50,000 Units Oral Q Wed  Edgardo Pontiff, DO   50,000 Units at 03/14/24 1044    Allergies as of 03/13/2024 - Review Complete 03/13/2024  Allergen Reaction Noted   Sulfa antibiotics Other (See Comments) 10/06/2011    Social History   Socioeconomic History   Marital status: Significant Other    Spouse name: Not on file   Number of children: Not on file   Years of education: Not on file   Highest education level: Not on file  Occupational History   Not on file  Tobacco Use   Smoking status: Every Day    Current packs/day: 0.25    Average packs/day: 0.3 packs/day for 30.0  years (7.5 ttl pk-yrs)    Types: Cigarettes   Smokeless tobacco: Never   Tobacco comments:     QUITTING ON HER OWN   down to 5 cig daily  Vaping Use   Vaping status: Every Day  Substance and Sexual Activity   Alcohol use: No   Drug use: No   Sexual activity: Not on file  Other Topics Concern   Not on file  Social History Narrative   Not on file   Social Drivers of Health   Financial Resource Strain: Not on file  Food Insecurity: No Food Insecurity (03/13/2024)   Hunger Vital Sign    Worried About Running Out of Food in the Last Year: Never true    Ran Out of Food in the Last Year: Never true  Transportation Needs: No Transportation Needs (03/13/2024)   PRAPARE - Administrator, Civil Service (Medical): No    Lack of Transportation (Non-Medical): No  Physical Activity: Not on file  Stress: Not on file  Social Connections: Moderately Integrated (03/13/2024)   Social Connection and Isolation Panel    Frequency of Communication with Friends and Family: Three times a week    Frequency of Social Gatherings with Friends and Family: Three times a week    Attends Religious Services: More than 4 times per year    Active Member of Clubs or Organizations: Yes    Attends Banker Meetings: More than 4 times per year    Marital Status: Never married  Intimate Partner Violence: Not At Risk (03/13/2024)   Humiliation, Afraid, Rape, and Kick questionnaire    Fear of Current or Ex-Partner: No    Emotionally Abused: No    Physically Abused: No    Sexually Abused: No     Code Status   Code Status: Full Code  Review of Systems: All systems reviewed and negative except where noted in HPI.  Physical Exam: Vital signs in last 24 hours: Temp:  [95.6 F (35.3 C)-98.6 F (37 C)] 97.9 F (36.6 C) (11/12 0949) Pulse Rate:  [63-96] 96 (11/12 0949) Resp:  [16-21] 17 (11/12 0949) BP: (92-114)/(56-64) 92/56 (11/12 0949) SpO2:  [85 %-100 %] 85 % (11/12 0949) Last  BM Date : 03/13/24  General:  Pleasant thin female in NAD Psych:  Cooperative. Normal mood and affect Eyes: Pupils equal Ears:  Normal auditory acuity Nose: No deformity, discharge or lesions Neck:  Supple, no masses felt Lungs:  Clear to auscultation.  Heart:  Regular rate, regular rhythm.  Abdomen:  Soft, nondistended, nontender, active bowel sounds, no masses felt Rectal :  Deferred Msk: Symmetrical without gross deformities.  Neurologic:  Alert, oriented to place and person. Confused otherwise.  Extremities : No edema Skin:  Intact without significant lesions.    Intake/Output from previous day: 11/11 0701 - 11/12  0700 In: 371.2 [I.V.:371.2] Out: -  Intake/Output this shift:  Total I/O In: 120 [P.O.:120] Out: -    Vina Dasen, NP-C   03/14/2024, 12:06 PM

## 2024-03-14 NOTE — Plan of Care (Signed)

## 2024-03-14 NOTE — Consult Note (Addendum)
 Palliative Care Consult Note                                  Date: 03/14/2024   Patient Name: Whitney Parrish  DOB:[birth year]  FMW:[id number]  Age / Sex:68 y.o., female  PCP: Phyllis Jereld BROCKS, NP Referring Physician: Francesco Elsie NOVAK, MD  Reason for Consultation: Establishing goals of care  Past Medical History:  Diagnosis Date   Anxiety    Arthritis    shoulders and hands   Asthma    Benign positional vertigo    Chronically dry eyes    CKD (chronic kidney disease), stage III (HCC)    Depression    Dyspnea on exertion    GERD (gastroesophageal reflux disease)    Hematuria    History of acute pyelonephritis    History of Bell's palsy    1980's left side-- residual lip numbness intermittant   History of chest pain    non-cardiac per dr claudene notes (pt's pcp)   History of panic attacks    Hypertension    Left ureteral stone    Loose bowel movements    chronic-per pt residual from injury during abdominal surgery in 1986   Migraines    Nephrolithiasis    left    Psoriasis    Urgency of urination    Wears dentures    has upper and lower but only wears upper     Assessment & Plan:   HPI/Patient Profile: 68 y.o. female  with past medical history of CKD IV, hypokalemia, psoriasis, depression admitted on 03/13/2024 with AKI on CKD, syncope, failure to thrive, and acute metabolic encephalopathy. Patient most recently admitted 10/18-10/25 for a similar presentation with syncope and generalized weakness. Family reports patient has not been doing well overall in the last 15 months ever since her admission on 11/2022 for pneumonia but markedly worse in the last 4 weeks. Reports about a 50 lb weight loss in the last year, her highest weight was 120 lbs and now she is 70 lbs.  Palliative medicine consulted for goals of care conversation.   SUMMARY OF RECOMMENDATIONS   Full code, full scope, will continue discussions on  code status Family meeting with partner and niece planned for 1100 on 11/13  Symptom Management:  Per primary team  Code Status: Full Code  Prognosis:  Unable to determine  Discharge Planning:  To Be Determined   Discussed with: Edgardo DO, partner Kristan), niece [name]), Cherisse NP  Subjective:   Reviewed medical records, received report from team, assessed the patient and then meet at the patient's bedside to discuss diagnosis, prognosis, GOC, EOL wishes disposition and options.  Before meeting with the patient/family, I spent time reviewing the chart notes including H&P, prior hospitalization notes, and ED notes. I also reviewed vital signs, nursing flowsheets, medication administrations record, labs, and imaging.  I met with patient and partner Kristan). Spoke on the phone with niece [name]). Patient remains encephalopathic and unable to participate meaningfully in goals of care discussion.    We meet to discuss diagnosis prognosis, GOC, EOL wishes, disposition and options. Concept of Palliative Care was introduced as specialized medical care for people and their families living with serious illness.  If focuses on providing relief from the symptoms and stress of a serious illness.  The goal is to improve quality of life for both the patient and the family. Values and goals of care  important to patient and family were attempted to be elicited.  Created space and opportunity for patient  and family to explore thoughts and feelings regarding current medical situation   Natural trajectory and current clinical status were discussed. Questions and concerns addressed. Patient encouraged to call with questions or concerns.    Patient/Family Understanding of Illness: - Patient has limited understanding of her illness due to her encephalopathy, repeatedly asked where she was - Programme Researcher, Broadcasting/film/video and niece have a general understanding that the patient's overall health decline in the last year and  are concerned for her marked weight loss  Life Review: - Patient raised 4 nieces and nephews after her sister passed away - Considers her nieces and nephews as children, but only one of them [name]) remains close by and is a constant part of her life - Met her partner [name] whom she met after living in the same apartment complex for the last 4 years - Close with the children of [name] who she helps take care of and help take to school in the past  Patient Values: - [name] shares that the patient is very independent and she raised her and her siblings to be independent  Baseline Status: - Lost about 50 lbs in the last 15 months, highest weight is 120 lbs and curent weight is 70 lbs - Lives alone in her apartment but her partner Kristan) will visit often or will visit his apartment  Today's Discussion: - Discussed with [name] about the patient's overall health in the last year, current hospitalization and goals for the patient going forward - Patient repeatedly asked if she was under a spell or if she was crazy - [name] does endorse that he is concerned for the patient's health since she has not been eating well and has deteriorated so drastically in the last month - Niece also shares that cognitively and physically she has been able to compensate while having poor PO intake, but in the last 4 weeks she has not been able to compensate and more acutely has been altered cognitively in the last 48 hours - Attempted to discuss code status with the patient but patient unable to meaningfully participate, partner [name] shares that he is not certain what decision she would make and would defer to niece, patient will remain full code at this time until further conversations can delineate goals - Niece endorses that she wants to do all medical interventions to keep the patient alive but does not know what decision the patient would make as the patient is very private about her health and has  never discussed her goals - Niece does say patient would not want to be dependent on other people if she became severely debilitated  Goals: - Would like to return to her baseline function and independence  Review of Systems  Constitutional:  Positive for appetite change and fatigue.   Objective:   Primary Diagnoses: Present on Admission:  Syncope  Gastroesophageal reflux disease  CKD (chronic kidney disease) stage 3, GFR 30-59 ml/min (HCC)  Protein-calorie malnutrition, severe  Acute renal failure  Diarrhea   Vital Signs:  BP (!) 114/56   Pulse 70   Temp 98.6 F (37 C)   Resp 16   Ht 5' (1.524 m)   Wt 32.7 kg   SpO2 95%   BMI 14.06 kg/m   Physical Exam Constitutional:      Appearance: She is ill-appearing.     Comments: Cachectic  HENT:     Head:  Normocephalic.     Nose: Nose normal.  Eyes:     Extraocular Movements: Extraocular movements intact.  Cardiovascular:     Rate and Rhythm: Normal rate.  Pulmonary:     Effort: Pulmonary effort is normal.  Abdominal:     General: Abdomen is flat.  Skin:    General: Skin is dry.  Neurological:     Mental Status: She is disoriented.     Comments: Slow to respond to questions, not aware of situation or place.     Palliative Assessment/Data: 40%   Thank you for allowing us  to participate in the care of PATRIA WARZECHA PMT will continue to support holistically.  Time Total: 75 minutes  Detailed review of medical records (labs, imaging, vital signs), medically appropriate exam, discussed with treatment team, counseling and education to patient, family, & staff, documenting clinical information, medication management, coordination of care.  Signed by: [name] Palliative Medicine Team  Team Phone # [phone] (Nights/Weekends)  03/14/2024, 9:18 AM

## 2024-03-14 NOTE — Progress Notes (Addendum)
 HD#0 SUBJECTIVE:  Patient Summary: Whitney Parrish is a female with a medical history significant for CKD stage 4, hypokalemia, asthma, GERD, psoriasis, tobacco use disorder, depression, and insomnia, who presented with syncope and was admitted for further evaluation and management of a syncopal episode.   Overnight Events: none  Interim History:  Saw pt this AM. Her partner was at bedside who added to the hx. Partner said mirtazapine  helped initially but not much. Mentions BM have been loose. Pt appeared to have eaten half of what was on her plate, which was the most she had eaten in days.   OBJECTIVE:  Vital Signs: Vitals:   03/13/24 2047 03/14/24 0537 03/14/24 0922 03/14/24 0949  BP: (!) 108/59 (!) 114/56  (!) 92/56  Pulse: 72 70  96  Resp:    17  Temp: 97.6 F (36.4 C) 98.6 F (37 C)  97.9 F (36.6 C)  TempSrc: Oral   Oral  SpO2: 95%  96% (!) 85%  Weight:      Height:       Supplemental O2: Room Air SpO2: (!) 85 %  Filed Weights   03/13/24 0800  Weight: 32.7 kg     Intake/Output Summary (Last 24 hours) at 03/14/2024 1501 Last data filed at 03/14/2024 1136 Gross per 24 hour  Intake 491.18 ml  Output --  Net 491.18 ml   Net IO Since Admission: 491.18 mL [03/14/24 1501]  Physical Exam: Physical Exam Constitutional:      Comments: Pt was alert and conversing well, however, would get distracted often and repeated the same question about why all of our team members were visiting her multiple times  Cardiovascular:     Rate and Rhythm: Normal rate.  Pulmonary:     Effort: Pulmonary effort is normal.     Breath sounds: Normal breath sounds.  Abdominal:     Palpations: Abdomen is soft.     Comments: TTP in all 4 quadrants   Musculoskeletal:     Right lower leg: No edema.     Left lower leg: No edema.     Comments: TTP of BL LE  Skin:    General: Skin is warm.     Patient Lines/Drains/Airways Status     Active Line/Drains/Airways     Name Placement  date Placement time Site Days   Peripheral IV 03/13/24 20 G Left Antecubital 03/13/24  0825  Antecubital  1   Wound 03/13/24 1739 Pressure Injury Buttocks Right Stage 2 -  Partial thickness loss of dermis presenting as a shallow open injury with a red, pink wound bed without slough. 03/13/24  1739  Buttocks  1   Wound 03/14/24 Irritant Contact Dermatitis Buttocks Medial 03/14/24  --  Buttocks  less than 1            Pertinent labs and imaging:      Latest Ref Rng & Units 03/14/2024    4:42 AM 03/13/2024   12:02 PM 03/13/2024    9:16 AM  CBC  WBC 4.0 - 10.5 K/uL 8.1     Hemoglobin 12.0 - 15.0 g/dL 7.5  9.9  87.7   Hematocrit 36.0 - 46.0 % 23.5  29.0  36.0   Platelets 150 - 400 K/uL 309          Latest Ref Rng & Units 03/14/2024    4:42 AM 03/13/2024    5:37 PM 03/13/2024   12:02 PM  CMP  Glucose 70 - 99 mg/dL 99  131  65   BUN 8 - 23 mg/dL 34  40  52   Creatinine 0.44 - 1.00 mg/dL 7.19  6.73  6.19   Sodium 135 - 145 mmol/L 143  142  146   Potassium 3.5 - 5.1 mmol/L 3.4  4.1  5.2   Chloride 98 - 111 mmol/L 120  121  128   CO2 22 - 32 mmol/L 12  11    Calcium 8.9 - 10.3 mg/dL 8.5  8.8    Total Protein 6.5 - 8.1 g/dL  6.1    Total Bilirubin 0.0 - 1.2 mg/dL  0.6    Alkaline Phos 38 - 126 U/L  72    AST 15 - 41 U/L  19    ALT 0 - 44 U/L  16      US  THYROID  Result Date: 03/14/2024 CLINICAL DATA:  Goiter. EXAM: THYROID  ULTRASOUND TECHNIQUE: Ultrasound examination of the thyroid  gland and adjacent soft tissues was performed. COMPARISON:  None Available. FINDINGS: Parenchymal Echotexture: Normal Isthmus: 0.3 cm Right lobe: 4.6 x 1.8 x 1.3 cm Left lobe: 4.2 x 1.8 x 2.0 cm _________________________________________________________ Estimated total number of nodules >/= 1 cm: 3 Number of spongiform nodules >/=  2 cm not described below (TR1): 0 Number of mixed cystic and solid nodules >/= 1.5 cm not described below (TR2): 0 _________________________________________________________ At  least 3 isoechoic predominantly solid nodules are present in the left mid and lower gland. None of the lesions measures larger than 1.4 cm. All are considered TI-RADS category 3. Given size (<1.4 cm) and appearance, this nodule does NOT meet TI-RADS criteria for biopsy or dedicated follow-up. IMPRESSION: 1. Mildly heterogeneous and enlarged thyroid  gland. 2. Several sonographically low risk nodules are noted incidentally in the left mid and lower gland. None meet criteria to warrant biopsy or dedicated imaging surveillance. The above is in keeping with the ACR TI-RADS recommendations - J Am Coll Radiol 2017;14:587-595. Electronically Signed   By: Wilkie Lent M.D.   On: 03/14/2024 12:38    ASSESSMENT/PLAN:  Assessment: Principal Problem:   Syncope Active Problems:   Dizziness   Gastroesophageal reflux disease   CKD (chronic kidney disease) stage 3, GFR 30-59 ml/min (HCC)   Protein-calorie malnutrition, severe   Acute renal failure   Diarrhea   Weakness generalized   Pressure injury of skin   AKI (acute kidney injury)   Plan: #Syncopal episode #Generalized weakness #Chronic Diarrhea with interval worsening Pt presents with weakness and diarrhea since her previous admission recently. Likely due to poor PO intake. Currently was eating breakfast. Will resume mirtazapine  to help with appetite stimulation as it helped during previous admission. Pt mentions having a bowel surgery in the past, however, it is unclear how much bowel was resected. Suspect her ongoing diarrhea is due to malabsorptive vs pancreatic insufficiency--GI on board. Extensive stool test ordered for further evaluation. GI to f/u with EGD/ CSY if pt wished to pursue full care/ work-up.GI will assess and prep pt for CSY tomorrow evening if pt and family consent to it after discussion on goals of care with Palliative. Repleting vitamins A and D for low levels.   - LR infusion started - Palliative to have talks with partner  and Niece on Goals of Care - GI to reassess pt tomorrow for CSY prep pending goals of care with palliative - Continue loperamide 4 mg TID  scheduled and psyllium daily  - Encouraged oral intake - RD consult for dietary optimization and calorie supplementation -  Watch for refeeding syndrome, monitor mg, phosphorus  - Orthostatic vitals - PT/OT   #NAGMA #Chronic Diarrhea NAGMA similar to previous admission likely 2/2 to chronic diarrhea. RTA less likely as work-up from last admission was unremarkable. Potassium slightly low and was repleted. Will be monitored and repleted as needed.  Treating her diarrhea per above.  - BMP daily - K repleted today      #Acute on chronic kidney injury # CKD 4 Creatinine of 3.9 on admission which improved to 2.8 this AM. Likely pre-renal due to low intake and volume depletion with improvement after receiving fluid boluses yesterday. Will start on continuous LR with Dextrose .   - Dextrose  5% in LR infusion  - Trend serum creatinine  #Thyroid  nodule US  of thyroid  was performed today which revealed several low risk nodules in the left mid-lower gland that do not meet the criteria for biopsy or imaging surveillance.   Chronic Conditions:    #Asthma - Breo ellipta daily   #Depression -  Resume Mirtazapine  as she was alert and for appetite stimulation -  Will slowly transition her into starting other centrally acting meds. Will hold Seroquel  for now.    #GERD - Continue on home p.o. pantoprazole  40 mg   #Tobacco use disorder - Nicotine  patch daily   #Lower extremity psoriasis The patient self-administers Tremfya  1 mg every 8 weeks. She also uses betamethasone dipropionate 0.05% topically as needed for her legs.   #Vertigo Continue to hold meclizine  25 mg 3 times daily as needed. Pt did not complain of dizziness today. Will await on PT evaluation first to resume.     #Peripheral neuropathy Pain in BL LE on PE. Will monitor kidney function for  one more day to resume home gabapentin .      Best Practice: Diet: Dysphagia 3 diet IVF: Fluids: D5-LR, Rate: 75 cc/hr x mL/ hrs VTE: heparin  injection 5,000 Units Start: 03/13/24 1445 Code: Full  Disposition planning: DISPO: home pending clinical improvement   Signature:  Rebecka Edgardo [facility]  3:01 PM, 03/14/2024  On Call pager [phone]

## 2024-03-14 NOTE — Evaluation (Signed)
 Physical Therapy Evaluation Patient Details Name: Whitney Parrish MRN: [id number] DOB: 1955-10-04 Today's Date: 03/14/2024  History of Present Illness  Pt is a 68 y.o. female presenting 03/13/24 with syncope and generalized weakness for the past 2 weeks. Workup for failure to thrive, worsening chronic diarrhea, NAGMA, and acute metabolic encephalopathy. PMH: CKD4, hypokalemia, asthma, GERD, psoriasis, depression, and insomnia.  Of note, recent hospitalization 10/18-10/25 for syncopal episode found to have severe hypokalemia secondary to chronic diarrhea.   Clinical Impression  Pt admitted with above diagnosis. PTA, pt was modI for functional mobility using SPC. She lives alone in a third floor apartment with elevator access. Her boyfriend lives next door and reports he can provide 24/7 supervision and assist. Pt currently with functional limitations due to the deficits listed below (see PT Problem List). She required minA x1-2 for bed mobility, transfers, and short distance gait. Pt mobilized with 2 HHA for safety. She is currently limited by impaired cognition, pain, incontinence, decreased balance, generalized decondition, and impaired activity tolerance. Pt was hypotensive throughout session. Her DBP dropped between sit>stand. Discussed with pt/boyfriend that she will require physical assistance and full time support with all mobility, ADLs, and IADLs. Both verbalized understanding. Pt will benefit from acute skilled PT to increase her independence and safety with mobility to allow discharge. Recommend HHPT to increase strength, improve balance, decrease fall risk, advance activity tolerance, decreased caregiver burden, and optimize safety within the home environment.     03/14/24 1700  Orthostatic Lying   BP- Lying 100/57  Pulse- Lying 83  Orthostatic Sitting  BP- Sitting 107/77  Pulse- Sitting 87  Orthostatic Standing at 0 minutes  BP- Standing at 0 minutes 98/66  Pulse- Standing at 0  minutes 92  Orthostatic Standing at 3 minutes  BP- Standing at 3 minutes (!) 86/57  Pulse- Standing at 3 minutes 91            If plan is discharge home, recommend the following: A lot of help with walking and/or transfers;A lot of help with bathing/dressing/bathroom;Assistance with cooking/housework;Assist for transportation;Help with stairs or ramp for entrance;Supervision due to cognitive status   Can travel by private vehicle        Equipment Recommendations Rollator (4 wheels);BSC/3in1;Other (comment) (transport chair)  Recommendations for Other Services       Functional Status Assessment Patient has had a recent decline in their functional status and demonstrates the ability to make significant improvements in function in a reasonable and predictable amount of time.     Precautions / Restrictions Precautions Precautions: Fall Recall of Precautions/Restrictions: Impaired Restrictions Weight Bearing Restrictions Per Provider Order: No      Mobility  Bed Mobility Overal bed mobility: Needs Assistance Bed Mobility: Supine to Sit, Sit to Supine     Supine to sit: Min assist, +2 for safety/equipment Sit to supine: Min assist   General bed mobility comments: Pt sat up on L side of bed with increased time. Assist to bring BLE off EOB, elevate trunk, and scoot hips fwd. Returning to bed pt controlled trunk down, assist to bring BLE in. Pt scooted towards HOB.    Transfers Overall transfer level: Needs assistance Equipment used: 2 person hand held assist Transfers: Sit to/from Stand Sit to Stand: Min assist, +2 physical assistance           General transfer comment: Pt stood from bed with 2 HHA. Powered up with minA. Good eccentric control.    Ambulation/Gait Ambulation/Gait assistance: Min assist, +2  physical assistance Gait Distance (Feet): 8 Feet Assistive device: 2 person hand held assist Gait Pattern/deviations: Step-to pattern, Decreased step length -  right, Decreased step length - left, Decreased stride length Gait velocity: decreased     General Gait Details: Pt ambulated with short slow steps achieving minimal foot clearence. 2 HHA and step by step cues to navigate around bed. Distance limited d/t fatigue and weakness.  Stairs            Wheelchair Mobility     Tilt Bed    Modified Rankin (Stroke Patients Only)       Balance Overall balance assessment: Needs assistance Sitting-balance support: No upper extremity supported, Feet supported Sitting balance-Leahy Scale: Fair Sitting balance - Comments: Pt sat EOB with close supervision.   Standing balance support: Bilateral upper extremity supported, During functional activity Standing balance-Leahy Scale: Poor Standing balance comment: Pt dependent on external support.                             Pertinent Vitals/Pain Pain Assessment Pain Assessment: Faces Faces Pain Scale: Hurts even more Pain Location: Bottom with pericare Pain Descriptors / Indicators: Tender Pain Intervention(s): Monitored during session, Limited activity within patient's tolerance, Repositioned    Home Living Family/patient expects to be discharged to:: Private residence Living Arrangements: Alone (boyfriend next door) Available Help at Discharge: Family;Friend(s);Available 24 hours/day (Boyfriend lives next door and can stay with her. Daughter can also check on pt frequently.) Type of Home: Apartment Home Access: Stairs to enter;Elevator   Entrance Stairs-Number of Steps: lives on the 3rd floor but most often takes elevator   Home Layout: One level Home Equipment: Cane - single point;Grab bars - tub/shower;Hand held shower head;Shower seat      Prior Function Prior Level of Function : Patient poor historian/Family not available;Independent/Modified Independent             Mobility Comments: Ambualtes using SPC. Pt reports a couple of fall d/t syncope and  vertigo. ADLs Comments: Pt's boyfriend reports he supervises all OOB ADLs lately. Pt was previously modI with basic self-care.     Extremity/Trunk Assessment   Upper Extremity Assessment Upper Extremity Assessment: Defer to OT evaluation    Lower Extremity Assessment Lower Extremity Assessment: Generalized weakness    Cervical / Trunk Assessment Cervical / Trunk Assessment: Other exceptions Cervical / Trunk Exceptions: Warden/ranger Communication: No apparent difficulties    Cognition Arousal: Alert Behavior During Therapy: WFL for tasks assessed/performed   PT - Cognitive impairments: Orientation, Awareness, Memory, Sequencing, Problem solving, Safety/Judgement   Orientation impairments: Time, Situation                   PT - Cognition Comments: Pt pleasantly confused needing increased time to follow commands. She is a poor historian. Her boyfriend was present and supportive throughout session answering most questions. Following commands: Impaired Following commands impaired: Follows one step commands with increased time     Cueing Cueing Techniques: Verbal cues, Gestural cues     General Comments General comments (skin integrity, edema, etc.): VSS on RA. Pt found to be soiled upon sitting up. RN entered to assist with pericare.    Exercises     Assessment/Plan    PT Assessment Patient needs continued PT services  PT Problem List Decreased strength;Decreased activity tolerance;Decreased balance;Decreased mobility;Decreased knowledge of use of DME;Decreased safety awareness;Decreased knowledge of precautions;Cardiopulmonary status limiting activity;Decreased cognition;Pain;Decreased skin  integrity       PT Treatment Interventions DME instruction;Gait training;Functional mobility training;Therapeutic activities;Therapeutic exercise;Balance training;Cognitive remediation;Neuromuscular re-education;Patient/family education;Wheelchair  mobility training    PT Goals (Current goals can be found in the Care Plan section)  Acute Rehab PT Goals Patient Stated Goal: Return Home PT Goal Formulation: With family Time For Goal Achievement: 03/28/24 Potential to Achieve Goals: Fair    Frequency Min 2X/week     Co-evaluation               AM-PAC PT 6 Clicks Mobility  Outcome Measure Help needed turning from your back to your side while in a flat bed without using bedrails?: A Little Help needed moving from lying on your back to sitting on the side of a flat bed without using bedrails?: A Little Help needed moving to and from a bed to a chair (including a wheelchair)?: A Little Help needed standing up from a chair using your arms (e.g., wheelchair or bedside chair)?: A Little Help needed to walk in hospital room?: A Lot Help needed climbing 3-5 steps with a railing? : A Lot 6 Click Score: 16    End of Session Equipment Utilized During Treatment: Gait belt Activity Tolerance: Patient tolerated treatment well Patient left: in bed;with call bell/phone within reach;with bed alarm set;with family/visitor present Nurse Communication: Mobility status PT Visit Diagnosis: Adult, failure to thrive (R62.7);Muscle weakness (generalized) (M62.81);Difficulty in walking, not elsewhere classified (R26.2);Unsteadiness on feet (R26.81)    Time: 8459-8441 PT Time Calculation (min) (ACUTE ONLY): 18 min   Charges:   PT Evaluation $PT Eval Moderate Complexity: 1 Mod   PT General Charges $$ ACUTE PT VISIT: 1 Visit         Randall SAUNDERS, PT, DPT Acute Rehabilitation Services Office: [phone] Secure Chat Preferred  Delon CHRISTELLA Callander 03/14/2024, 5:34 PM

## 2024-03-14 NOTE — Progress Notes (Signed)
 There was a consult for a PIV access. Lt. Lower arm was infiltrated and swollen. Assessed Upper arm: only one brachial vein but nerve bundle was top of brachial vein. Assessed Rt. Lower arm: veins were very small. Upper arm: only one brachial vein. This patient GFR was less than 35, so if need midline, need MD's permission or nephrologist. Informed Dr. Francesco and patient's RN regarding this finding. No response yet. Jama RN

## 2024-03-14 NOTE — Consult Note (Signed)
 WOC Nurse Consult Note: Reason for Consult: buttocks wounds  Wound type: 1.  Moisture Associated Skin Damage B buttocks/perirectal area erythema with scattered partial thickness skin loss  2.  Linear Stage 2 Pressure Injury R lateral buttock 2 separate areas red moist  Pressure Injury POA: Yes Measurement: see nursing flow sheet  Wound bed: as above  Drainage (amount, consistency, odor) serosanguinous  Periwound: moisture damage  Dressing procedure/placement/frequency: Cleanse B buttocks and perirectal area with soap and water, dry and apply Gerhardt's Butt Cream to entire area 2 times daily and prn soiling.  Cover Stage 2 R lateral buttock with silicone foam, lift daily to assess and change foam q3 days and prn soiling.    POC discussed with bedside nurse.  Appreciate N. Ondara, RN assistance with this consult. WOC team will not follow.  Re-consult if further needs arise.    Thank you,    Powell Bar MSN, RN-BC, [employer]

## 2024-03-14 NOTE — Progress Notes (Signed)
 Nutrition Follow-up  DOCUMENTATION CODES:   Severe malnutrition in context of chronic illness, Underweight  INTERVENTION:  Encourage PO intake - On regular diet with thin liquids  Per pt's preference transition to dysphagia 3 diet to help with ease of intake Room service with assist Nursing to assist with meals Ensure Plus High Protein po BID, each supplement provides 350 kcal and 20 grams of protein Mighty Shake TID with meals, each supplement provides 330 kcals and 9 grams of protein 1 packet Juven BID, each packet provides 95 calories, 2.5 grams of protein (collagen), to support wound healing MVI with minerals daily 100 mg Thiamine daily Continue Vitamin A  and Vitamin D for noted deficiency  500 mg Vitamin C x 30 days and 220 mg Zinc x 15 days for wound healing  Recommend resuming Remeron  for poor appetite   NUTRITION DIAGNOSIS:   Severe Malnutrition related to chronic illness as evidenced by severe muscle depletion, severe fat depletion, energy intake < or equal to 75% for > or equal to 1 month, percent weight loss (In the last year pt has lost 23 lbs, 24%.).   GOAL:   Patient will meet greater than or equal to 90% of their needs, Weight gain   MONITOR:   PO intake, Supplement acceptance, Labs, Weight trends, I & O's, Skin  REASON FOR ASSESSMENT:   Consult Assessment of nutrition requirement/status  ASSESSMENT:  68 y.o Female with PMH CKD4, hypokalemia, HTN, GERD, expl lap with abdominal myomectomy 1986 for injury, depression. Presented with syncope, ongoing loose BM, weakness, FTT, possible dementia vs metabolic encephalopathy.   11/11 - CT imaging of the head and abdomen showed no acute intracranial, abdominal, or infectious process.   Pt last admitted on 10/18 for syncope, found to have severe hypokalemia secondary to chronic diarrhea. She was discharged home 10/25 on potassium supplementation and anti diarrheal regimen. Pt resting in bed eating lunch, pt seems  slightly confused, boyfriend at bedside provided most history.   Pt has had a poor appetite for the past month d/t her hospitalizations that has worsened in the last week. Pt's intake seemed to be increasing when pt was discharged as she was started on mirtazapine . However, a couple days after being discharged pt has had worsening po intake and appetite, only eating a couple bites of a meal throughout the whole day. Pt's boyfriend reports he would cook foods pt would like and pt would still refuse to eat. Was drinking 1 Boost/ensure per day but this has decreased to every other day or none at all.   Pt's boyfriend reports she has also been more confused recently and not taking her imodium or Mirtazapine  consistently, he states she would throw them on the floor. Last admission pt's diarrhea was controlled with fiber and imodium. Has had diarrhea for 7-8 years with as much as 7-8 BM daily. Does have a history of a possible section of her bowel removed in her hysterectomy during the 1980s, pt reported.   Pt's UBW is 125-130 lbs which she last weighed about 2 years ago. In the last year pt has lost 23 lbs, 24%. In the last month pt has lost 8 lbs, 10%. On exam pt with severe muscle and fat wasting.  Pt severely malnourished in the setting of chronic diarrhea. Add ONS. Encouraged increased po intake and consumption of BRAT diet to help with stools. Educated patient on the importance of ONS as meal replacement and for nutrition repletion.   Pt had 25% of her  breakfast this morning which pt's boyfriend states is the most he has seen her eat in the past week. RD observed pt eating lunch and noticed pt having difficulty cutting her food, pt willing to try Dysphagia 3 diet, Nursing to help with meals.  ADL/IADLS independently PTA, uses a cane.   Per MD/GI: Unknown etiology of loose stools, C.diff + GI pathogen negative, No acute fidings on CT AP but limited study without contrast. Other autoimmune diseases such as  celiac, IBD. Also consider microscopic colitis. Pancreatic insuff possible though diarrhea doesn't seem meal driven.   Ongoing GOC, pallative involved. Possible colonoscopy and EGD.    Admit weight: 32.7 kg Current weight: 32.7 kg  Wt Readings from Last 10 Encounters:  03/13/24 32.7 kg  03/07/24 33 kg  02/21/24 36.7 kg  11/08/22 43.2 kg  05/21/22 44 kg  11/05/21 53.5 kg  07/08/20 54 kg  08/18/19 60.8 kg  04/16/18 60.8 kg  09/23/17 62.1 kg    Average Meal Intake: 11/12: 50% intake x 2 recorded meals  Nutritionally Relevant Medications: Scheduled Meds:  ascorbic acid  500 mg Oral Daily   cholecalciferol  1,000 Units Oral Daily   cyanocobalamin  1,000 mcg Oral Daily   feeding supplement  237 mL Oral BID BM   folic acid  1 mg Oral Daily   lactobacillus acidophilus  2 tablet Oral TID   loperamide  4 mg Oral TID AC & HS   mirtazapine   7.5 mg Oral Daily   multivitamin with minerals  1 tablet Oral Daily   nutrition supplement (JUVEN)  1 packet Oral BID BM   psyllium  1 packet Oral Daily   thiamine  100 mg Oral Daily   vitamin A  10,000 Units Oral Daily   Vitamin D (Ergocalciferol)  50,000 Units Oral Q Wed   zinc sulfate (50mg  elemental zinc)  220 mg Oral Daily   Continuous Infusions:  dextrose  5% lactated ringers  Stopped (03/14/24 1800)   magnesium  sulfate bolus IVPB     Labs Reviewed: BUN 29 Creatinine 2.58 GFR 20 CBG ranges from 78-99 mg/dL over the last 24 hours October: Vitamin D 22.55 (low) Vitamin A 7.4 (low) Vitamin E 14.9 (normal) Folate 3.8 (low)  NUTRITION - FOCUSED PHYSICAL EXAM:  Flowsheet Row Most Recent Value  Orbital Region Severe depletion  Upper Arm Region Severe depletion  Thoracic and Lumbar Region Severe depletion  Buccal Region Severe depletion  Temple Region Severe depletion  Clavicle Bone Region Severe depletion  Clavicle and Acromion Bone Region Severe depletion  Scapular Bone Region Severe depletion  Dorsal Hand Severe depletion   Patellar Region Severe depletion  Anterior Thigh Region Severe depletion  Posterior Calf Region Severe depletion  Edema (RD Assessment) None  Hair Reviewed  Eyes Reviewed  Mouth Reviewed  Skin Reviewed  Nails Reviewed    Diet Order:   Diet Order             Diet clear liquid Room service appropriate? Yes; Fluid consistency: Thin  Diet effective now                   EDUCATION NEEDS:   Education needs have been addressed  Skin:  Skin Assessment: Skin Integrity Issues: Skin Integrity Issues:: Stage II Stage II: Right buttocks  Last BM:  11/12, x 3, type 7  Height:   Ht Readings from Last 1 Encounters:  03/13/24 5' (1.524 m)    Weight:   Wt Readings from Last 1 Encounters:  03/13/24  32.7 kg    Ideal Body Weight:  45.5 kg  BMI:  Body mass index is 14.06 kg/m.  Estimated Nutritional Needs:   Kcal:  1500-1700 kcal  Protein:  70-90 gm  Fluid:  >1.6L/day   Olivia Kenning, RD Registered Dietitian  See Amion for more information

## 2024-03-14 NOTE — Care Management Obs Status (Signed)
 MEDICARE OBSERVATION STATUS NOTIFICATION   Patient Details  Name: Whitney Parrish MRN: [id number] Date of Birth: Dec 10, 1955   Medicare Observation Status Notification Given:  Yes  I, Luann SHAUNNA Cumming, LCSW, verbally reviewed observation notice with  [name], [name] (Niece) telephonically at [phone] [facility]).    Luann SHAUNNA Cumming, LCSW 03/14/2024, 11:56 AM

## 2024-03-14 NOTE — Progress Notes (Signed)
 IV removed due to infiltration of IV fluids. IV consult placed

## 2024-03-14 NOTE — Progress Notes (Signed)
 Occupational Therapy Treatment Patient Details Name: Whitney Parrish MRN: [id number] DOB: [birth year] Today's Date: 03/14/2024   History of present illness Pt is a 68 y.o. female presenting 03/13/24 with syncope and generalized weakness for the past 2 weeks. Workup for failure to thrive, worsening chronic diarrhea, NAGMA, and acute metabolic encephalopathy. PMH: CKD4, hypokalemia, asthma, GERD, psoriasis, depression, and insomnia.  Of note, recent hospitalization 10/18-10/25 for syncopal episode found to have severe hypokalemia secondary to chronic diarrhea (Simultaneous filing. User may not have seen previous data.)   OT comments  Pt presents with problem above with deficits listed below. Upon eval, pt pleasantly confused needing increased time to follow commends. Pt currently needing grossly min-mod A +2 safety for mobility and OOB ADL. Pt with incontinence during session and RN provided pericare. Pt boyfriend pleasant and supportive. Highly recommending 24/7 supervision/assist at discharge from family and significant other as well as assist with all IADL tasks due to current cognitive status in addition to HHOT. Will continue to follow.       If plan is discharge home, recommend the following:  Assistance with cooking/housework;Help with stairs or ramp for entrance;Assist for transportation;Direct supervision/assist for financial management;Direct supervision/assist for medications management;A lot of help with walking and/or transfers;A lot of help with bathing/dressing/bathroom   Equipment Recommendations  Other (comment);BSC/3in1 (RW)    Recommendations for Other Services      Precautions / Restrictions Precautions Precautions: Fall Recall of Precautions/Restrictions: Impaired Restrictions Weight Bearing Restrictions Per Provider Order: No       Mobility Bed Mobility Overal bed mobility: Needs Assistance Bed Mobility: Supine to Sit     Supine to sit: Min assist     General  bed mobility comments: cues for sequence    Transfers Overall transfer level: Needs assistance Equipment used: None Transfers: Sit to/from Stand Sit to Stand: Min assist           General transfer comment: for rise and steady     Balance Overall balance assessment: Needs assistance Sitting-balance support: No upper extremity supported, Feet supported Sitting balance-Leahy Scale: Good     Standing balance support: No upper extremity supported Standing balance-Leahy Scale: Poor                             ADL either performed or assessed with clinical judgement   ADL Overall ADL's : Needs assistance/impaired Eating/Feeding: Set up;Sitting   Grooming: Set up;Sitting   Upper Body Bathing: Set up;Sitting   Lower Body Bathing: Sit to/from stand;Minimal assistance   Upper Body Dressing : Set up;Sitting   Lower Body Dressing: Sit to/from stand;Minimal assistance;Moderate assistance   Toilet Transfer: Minimal assistance;Moderate assistance;+2 for safety/equipment;+2 for physical assistance;Ambulation   Toileting- Clothing Manipulation and Hygiene: Total assistance;Sit to/from stand       Functional mobility during ADLs: Minimal assistance;Moderate assistance;+2 for physical assistance;+2 for safety/equipment      Extremity/Trunk Assessment Upper Extremity Assessment Upper Extremity Assessment: Generalized weakness   Lower Extremity Assessment Lower Extremity Assessment: Defer to PT evaluation        Vision Baseline Vision/History: 0 No visual deficits Ability to See in Adequate Light: 0 Adequate Patient Visual Report: No change from baseline Vision Assessment?: Vision impaired- to be further tested in functional context   Perception     Praxis     Communication Communication Communication: No apparent difficulties   Cognition Arousal: Alert Behavior During Therapy: WFL for tasks assessed/performed Cognition: Cognition impaired  Orientation impairments: Situation (time not formally assessed) Awareness: Online awareness impaired, Intellectual awareness impaired Memory impairment (select all impairments): Short-term memory, Working civil service fast streamer, Conservation officer, historic buildings Attention impairment (select first level of impairment): Sustained attention Executive functioning impairment (select all impairments): Organization, Sequencing, Reasoning, Problem solving OT - Cognition Comments: follows one step commands, poor historian.                 Following commands: Impaired Following commands impaired: Follows one step commands with increased time      Cueing   Cueing Techniques: Verbal cues, Gestural cues  Exercises      Shoulder Instructions       General Comments      Pertinent Vitals/ Pain       Pain Assessment Pain Assessment: Faces Faces Pain Scale: Hurts even more Pain Location: bottom with pericare Pain Descriptors / Indicators: Tender Pain Intervention(s): Limited activity within patient's tolerance, Monitored during session  Home Living Family/patient expects to be discharged to:: Private residence Living Arrangements: Alone (boyfriend next door) Available Help at Discharge: Family;Friend(s);Available 24 hours/day (daughter can also check on pt) Type of Home: Apartment Home Access: Stairs to enter;Elevator Entrance Stairs-Number of Steps: lives on the 3rd floor but most often takes engineer, structural   Home Layout: One level     Bathroom Shower/Tub: Producer, Television/film/video: Standard     Home Equipment: Cane - single point;Grab bars - tub/shower;Hand held shower head;Shower seat          Prior Functioning/Environment              Frequency  Min 2X/week        Progress Toward Goals  OT Goals(current goals can now be found in the care plan section)     Acute Rehab OT Goals Patient Stated Goal: get better OT Goal Formulation: With patient Time For Goal Achievement:  03/28/24 Potential to Achieve Goals: Good ADL Goals Pt Will Perform Grooming: with supervision;with contact guard assist;standing Pt Will Perform Lower Body Dressing: with contact guard assist;sit to/from stand Pt Will Transfer to Toilet: ambulating;with contact guard assist  Plan      Co-evaluation                 AM-PAC OT 6 Clicks Daily Activity     Outcome Measure   Help from another person eating meals?: None Help from another person taking care of personal grooming?: A Little Help from another person toileting, which includes using toliet, bedpan, or urinal?: A Lot Help from another person bathing (including washing, rinsing, drying)?: A Lot Help from another person to put on and taking off regular upper body clothing?: A Little Help from another person to put on and taking off regular lower body clothing?: A Lot 6 Click Score: 16    End of Session    OT Visit Diagnosis: Unsteadiness on feet (R26.81);History of falling (Z91.81);Muscle weakness (generalized) (M62.81);Other symptoms and signs involving cognitive function   Activity Tolerance Patient tolerated treatment well   Patient Left in bed;with call bell/phone within reach;with bed alarm set;with family/visitor present   Nurse Communication Mobility status        Time: 8474-8458 OT Time Calculation (min): 16 min  Charges: OT General Charges $OT Visit: 1 Visit OT Evaluation $OT Eval Moderate Complexity: 1 Mod  Whitney Parrish, OTR/L Whitney Parrish Acute Rehabilitation Office: [phone]   Whitney JONETTA Lebron 03/14/2024, 5:22 PM

## 2024-03-15 DIAGNOSIS — E8721 Acute metabolic acidosis: Secondary | ICD-10-CM | POA: Diagnosis not present

## 2024-03-15 DIAGNOSIS — N184 Chronic kidney disease, stage 4 (severe): Secondary | ICD-10-CM

## 2024-03-15 DIAGNOSIS — N179 Acute kidney failure, unspecified: Secondary | ICD-10-CM | POA: Diagnosis not present

## 2024-03-15 DIAGNOSIS — Z515 Encounter for palliative care: Secondary | ICD-10-CM | POA: Diagnosis not present

## 2024-03-15 DIAGNOSIS — R627 Adult failure to thrive: Secondary | ICD-10-CM | POA: Diagnosis not present

## 2024-03-15 DIAGNOSIS — K529 Noninfective gastroenteritis and colitis, unspecified: Secondary | ICD-10-CM | POA: Diagnosis not present

## 2024-03-15 DIAGNOSIS — E43 Unspecified severe protein-calorie malnutrition: Secondary | ICD-10-CM | POA: Diagnosis not present

## 2024-03-15 DIAGNOSIS — N1832 Chronic kidney disease, stage 3b: Secondary | ICD-10-CM

## 2024-03-15 DIAGNOSIS — R55 Syncope and collapse: Secondary | ICD-10-CM | POA: Diagnosis not present

## 2024-03-15 LAB — CBC
HCT: 24.1 % — ABNORMAL LOW (ref 36.0–46.0)
Hemoglobin: 7.6 g/dL — ABNORMAL LOW (ref 12.0–15.0)
MCH: 32.6 pg (ref 26.0–34.0)
MCHC: 31.5 g/dL (ref 30.0–36.0)
MCV: 103.4 fL — ABNORMAL HIGH (ref 80.0–100.0)
Platelets: 301 K/uL (ref 150–400)
RBC: 2.33 MIL/uL — ABNORMAL LOW (ref 3.87–5.11)
RDW: 16.4 % — ABNORMAL HIGH (ref 11.5–15.5)
WBC: 8.6 K/uL (ref 4.0–10.5)
nRBC: 2.6 % — ABNORMAL HIGH (ref 0.0–0.2)

## 2024-03-15 LAB — RENAL FUNCTION PANEL
Albumin: 2.1 g/dL — ABNORMAL LOW (ref 3.5–5.0)
Anion gap: 9 (ref 5–15)
BUN: 29 mg/dL — ABNORMAL HIGH (ref 8–23)
CO2: 15 mmol/L — ABNORMAL LOW (ref 22–32)
Calcium: 8.3 mg/dL — ABNORMAL LOW (ref 8.9–10.3)
Chloride: 116 mmol/L — ABNORMAL HIGH (ref 98–111)
Creatinine, Ser: 2.58 mg/dL — ABNORMAL HIGH (ref 0.44–1.00)
GFR, Estimated: 20 mL/min — ABNORMAL LOW (ref >60)
Glucose, Bld: 78 mg/dL (ref 70–99)
Phosphorus: 3.3 mg/dL (ref 2.5–4.6)
Potassium: 4 mmol/L (ref 3.5–5.1)
Sodium: 140 mmol/L (ref 135–145)

## 2024-03-15 LAB — PANCREATIC ELASTASE, FECAL: Pancreatic Elastase-1, Stool: 37 ug Elast./g — ABNORMAL LOW

## 2024-03-15 LAB — MAGNESIUM: Magnesium: 1.7 mg/dL (ref 1.7–2.4)

## 2024-03-15 MED ORDER — NA SULFATE-K SULFATE-MG SULF 17.5-3.13-1.6 GM/177ML PO SOLN
0.5000 | Freq: Once | ORAL | Status: AC
  Administered 2024-03-15: 177 mL via ORAL
  Filled 2024-03-15: qty 1

## 2024-03-15 MED ORDER — LOPERAMIDE HCL 2 MG PO CAPS: 4.0000 mg | ORAL_CAPSULE | Freq: Three times a day (TID) | ORAL | Status: DC

## 2024-03-15 MED ORDER — BACID PO TABS
2.0000 | ORAL_TABLET | Freq: Three times a day (TID) | ORAL | Status: DC
  Filled 2024-03-15: qty 2

## 2024-03-15 MED ORDER — ZINC SULFATE 220 (50 ZN) MG PO CAPS
220.0000 mg | ORAL_CAPSULE | Freq: Every day | ORAL | Status: DC
  Administered 2024-03-15 – 2024-03-26 (×9): 220 mg via ORAL
  Filled 2024-03-15 (×12): qty 1

## 2024-03-15 MED ORDER — SODIUM CHLORIDE 0.9 % IV SOLN: INTRAVENOUS | Status: DC

## 2024-03-15 MED ORDER — SIMETHICONE 80 MG PO CHEW
240.0000 mg | CHEWABLE_TABLET | Freq: Once | ORAL | Status: AC
  Administered 2024-03-15: 240 mg via ORAL
  Filled 2024-03-15: qty 3

## 2024-03-15 MED ORDER — DEXTROSE IN LACTATED RINGERS 5 % IV SOLN: INTRAVENOUS | Status: AC

## 2024-03-15 MED ORDER — VITAMIN C 500 MG PO TABS
500.0000 mg | ORAL_TABLET | Freq: Every day | ORAL | Status: DC
  Administered 2024-03-15 – 2024-03-17 (×3): 500 mg via ORAL
  Filled 2024-03-15 (×6): qty 1

## 2024-03-15 MED ORDER — BOOST / RESOURCE BREEZE PO LIQD CUSTOM
1.0000 | Freq: Three times a day (TID) | ORAL | Status: DC
  Administered 2024-03-15 – 2024-03-19 (×7): 1 via ORAL

## 2024-03-15 MED ORDER — JUVEN PO PACK
1.0000 | PACK | Freq: Two times a day (BID) | ORAL | Status: DC
  Administered 2024-03-15 – 2024-03-26 (×15): 1 via ORAL
  Filled 2024-03-15 (×18): qty 1

## 2024-03-15 MED ORDER — RISAQUAD PO CAPS
2.0000 | ORAL_CAPSULE | Freq: Three times a day (TID) | ORAL | Status: DC
  Administered 2024-03-15 – 2024-03-26 (×25): 2 via ORAL
  Filled 2024-03-15 (×33): qty 2

## 2024-03-15 MED ORDER — MAGNESIUM SULFATE 4 GM/100ML IV SOLN
4.0000 g | Freq: Once | INTRAVENOUS | Status: AC
  Administered 2024-03-15: 4 g via INTRAVENOUS
  Filled 2024-03-15: qty 100

## 2024-03-15 NOTE — TOC Initial Note (Addendum)
 Transition of Care (TOC) - Initial/Assessment Note   Spoke to patient and [name] Cocking at bedside . Patient lives alone . [name] Cocking lives next door and can provide 24/7 assistance.   Patient has a cane at home.   PT recommending HHPT , rollator and transport chair . Both in agreement. No preference.   Ordered Presenter, [employer]. Per Jelani with [facility] does not cover both transport chair and Rolator . Jelani called [name] and data processing manager. Jelani also called patient's room and no answer   Family to be taught wound care prior to discharge   Artvia with [facility] declined referral.   Burnard with [facility] declined referral.   Sent referral to Amy with [facility] awaiting determination . Amy with [facility] accepted referral.   NCM entered DME orders and home health orders and secure chatted MD to sign    Patient Details  Name: Whitney Parrish MRN: [id number] Date of Birth: May 02, 1956  Transition of Care [facility]) CM/SW Contact:    Stephane Powell Jansky, RN Phone Number: 03/15/2024, 1:23 PM  Clinical Narrative:                   Expected Discharge Plan: Home w Home Health Services Barriers to Discharge: Continued Medical Work up   Patient Goals and CMS Choice Patient states their goals for this hospitalization and ongoing recovery are:: to return to home CMS Medicare.gov Compare Post Acute Care list provided to:: Patient Represenative (must comment) Choice offered to / list presented to : Patient      Expected Discharge Plan and Services   Discharge Planning Services: CM Consult Post Acute Care Choice: Durable Medical Equipment, Home Health Living arrangements for the past 2 months: Apartment                 DME Arranged: Walker rolling with seat, Other see comment (transport chair) DME Agency: [employer] Date DME Agency Contacted: 03/15/24 Time DME Agency Contacted: 1321 Representative spoke with at DME Agency: [facility]  Arranged: OT, PT   Date HH Agency Contacted: 03/15/24 Time HH Agency Contacted: 1322 Representative spoke with at [facility] Agency: [facility] awaiting determination  Prior Living Arrangements/Services Living arrangements for the past 2 months: Apartment Lives with:: Self Patient language and need for interpreter reviewed:: Yes Do you feel safe going back to the place where you live?: Yes      Need for Family Participation in Patient Care: Yes (Comment) Care giver support system in place?: Yes (comment) Current home services: DME Criminal Activity/Legal Involvement Pertinent to Current Situation/Hospitalization: No - Comment as needed  Activities of Daily Living   ADL Screening (condition at time of admission) Independently performs ADLs?: Yes (appropriate for developmental age) Is the patient deaf or have difficulty hearing?: No Does the patient have difficulty seeing, even when wearing glasses/contacts?: No Does the patient have difficulty concentrating, remembering, or making decisions?: No  Permission Sought/Granted   Permission granted to share information with : Yes, Verbal Permission Granted  Share Information with NAME: SO [name] Cocking  Permission granted to share info w AGENCY: [facility]        Emotional Assessment Appearance:: Appears stated age Attitude/Demeanor/Rapport: Engaged Affect (typically observed): Appropriate Orientation: : Oriented to Self Alcohol / Substance Use: Not Applicable Psych Involvement: No (comment)  Admission diagnosis:  Weakness generalized [R53.1] Failure to thrive in adult [R62.7] AKI (acute kidney injury) [N17.9] Patient Active Problem List   Diagnosis Date Noted   AKI (  acute kidney injury) 03/14/2024   Failure to thrive in adult 03/14/2024   Hypoalbuminemia 03/14/2024   Weakness generalized 03/13/2024   Pressure injury of skin 03/13/2024   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) 03/02/2024    Hypokalemia 02/23/2024   Chronic diarrhea 02/22/2024   Protein-calorie malnutrition, severe 02/21/2024   Acute renal failure 02/21/2024   Syncope 02/21/2024   Bronchopneumonia 02/18/2024   Community acquired pneumonia 11/03/2022   Acute pyelonephritis 07/08/2020   Sepsis (HCC) 07/08/2020   CKD (chronic kidney disease) stage 3, GFR 30-59 ml/min (HCC) 07/08/2020   Right lower lobe pulmonary nodule 07/08/2020   Colon cancer screening 06/26/2020   Dysphagia 06/26/2020   Gastroesophageal reflux disease 06/26/2020   Acute pyonephrosis 09/09/2015   Chronic renal insufficiency 09/09/2015   Acute renal insufficiency 09/09/2015   Obstructive uropathy 09/08/2015   Left nephrolithiasis 09/08/2015   Chest pain at rest 11/15/2011   Dizziness 11/15/2011   Breast abscess 10/06/2011   PCP:  Phyllis Jereld BROCKS, NP Pharmacy:   [facility] [phone] - [place] - [address] AT [facility] [address] [place] [address] Phone: [phone] Fax: [phone]  [facility] [address] [place] [address] Phone: [phone] Fax: [phone]     Social Drivers of Health (SDOH) Social History: SDOH Screenings   Food Insecurity: No Food Insecurity (03/13/2024)  Housing: Low Risk  (03/13/2024)  Recent Concern: Housing - High Risk (02/19/2024)  Transportation Needs: No Transportation Needs (03/13/2024)  Utilities: Not At Risk (03/13/2024)  Social Connections: Moderately Integrated (03/13/2024)  Tobacco Use: High Risk (03/13/2024)   SDOH Interventions:     Readmission Risk Interventions    02/22/2024    2:28 PM  Readmission Risk Prevention Plan  Transportation Screening Complete  PCP or Specialist Appt within 5-7 Days Complete  Home Care Screening Complete  Medication Review (RN CM) Referral to Pharmacy

## 2024-03-15 NOTE — Progress Notes (Signed)
 HD#1 SUBJECTIVE:  Patient Summary: Whitney Parrish is a female with a medical history significant for CKD stage 4, hypokalemia, asthma, GERD, psoriasis, tobacco use disorder, depression, and insomnia, who presented with syncope and was admitted for further evaluation and management of a syncopal episode. Palliative and GI following to help with goals of care and further evaluation for a malabsorption condition, respectively.   Overnight Events:  None   Interim History:  Saw pt at bedside this AM. Partner and niece at bedside. Pt reports feeling much better than when she came in. Partner thinks diarrhea has slowed down, though not sure as she is in briefs which are changed by nursing.   OBJECTIVE:  Vital Signs: Vitals:   03/14/24 1850 03/14/24 2156 03/15/24 0532 03/15/24 1109  BP: 108/72 103/67 108/68 102/72  Pulse: 82 86 81 67  Resp: 17 16 17 17   Temp: 98.2 F (36.8 C) (!) 97.5 F (36.4 C) 98.1 F (36.7 C) 98.1 F (36.7 C)  TempSrc: Oral Axillary Oral Oral  SpO2: 100% 100% 100% 95%  Weight:      Height:       Supplemental O2: Room Air SpO2: 95 %  Filed Weights   03/13/24 0800  Weight: 32.7 kg     Intake/Output Summary (Last 24 hours) at 03/15/2024 1445 Last data filed at 03/14/2024 1530 Gross per 24 hour  Intake 120 ml  Output --  Net 120 ml   Net IO Since Admission: 611.18 mL [03/15/24 1445]  Physical Exam: Physical Exam Constitutional:      Comments: Weak appearing woman. Mild improvement in mentation compared to yesterday as she answered several questions about how she was feeling.   Cardiovascular:     Rate and Rhythm: Normal rate.     Comments: +2 RP BL +2 DP BL Pulmonary:     Effort: Pulmonary effort is normal. No respiratory distress.     Breath sounds: Normal breath sounds.  Musculoskeletal:     Comments: No tenderness on palpation in BL LE  Neurological:     Mental Status: She is alert.     Patient Lines/Drains/Airways Status     Active  Line/Drains/Airways     Name Placement date Placement time Site Days   Peripheral IV 03/14/24 22 G 1 Posterior;Right Forearm 03/14/24  2029  Forearm  1   Wound 03/13/24 1739 Pressure Injury Buttocks Right Stage 2 -  Partial thickness loss of dermis presenting as a shallow open injury with a red, pink wound bed without slough. 03/13/24  1739  Buttocks  2   Wound 03/14/24 Irritant Contact Dermatitis Buttocks Medial 03/14/24  --  Buttocks  1            Pertinent labs and imaging:      Latest Ref Rng & Units 03/15/2024    5:39 AM 03/14/2024    4:42 AM 03/13/2024   12:02 PM  CBC  WBC 4.0 - 10.5 K/uL 8.6  8.1    Hemoglobin 12.0 - 15.0 g/dL 7.6  7.5  9.9   Hematocrit 36.0 - 46.0 % 24.1  23.5  29.0   Platelets 150 - 400 K/uL 301  309         Latest Ref Rng & Units 03/15/2024    5:39 AM 03/14/2024    4:42 AM 03/13/2024    5:37 PM  CMP  Glucose 70 - 99 mg/dL 78  99  868   BUN 8 - 23 mg/dL 29  34  40  Creatinine 0.44 - 1.00 mg/dL 7.41  7.19  6.73   Sodium 135 - 145 mmol/L 140  143  142   Potassium 3.5 - 5.1 mmol/L 4.0  3.4  4.1   Chloride 98 - 111 mmol/L 116  120  121   CO2 22 - 32 mmol/L 15  12  11    Calcium 8.9 - 10.3 mg/dL 8.3  8.5  8.8   Total Protein 6.5 - 8.1 g/dL   6.1   Total Bilirubin 0.0 - 1.2 mg/dL   0.6   Alkaline Phos 38 - 126 U/L   72   AST 15 - 41 U/L   19   ALT 0 - 44 U/L   16     No results found.  ASSESSMENT/PLAN:  Assessment: Principal Problem:   Syncope Active Problems:   Dizziness   Gastroesophageal reflux disease   CKD (chronic kidney disease) stage 3, GFR 30-59 ml/min (HCC)   Protein-calorie malnutrition, severe   Acute renal failure   Chronic diarrhea   Weakness generalized   Pressure injury of skin   AKI (acute kidney injury)   Failure to thrive in adult   Hypoalbuminemia   Plan: #Syncopal episode #Generalized weakness #Chronic Diarrhea with interval worsening Pt appeared to speak more clearly today, per PE. Partner reports she  ate yesterday and today. Mentioned that it appeared her BM have slowed down, though he mentioned there was a possibility of her having several BM before she was changed. Two BM noted on 11/12 per charting. Palliative spoke to pt, partner, niece together and told us  that they had agreed to proceed with full care at this time and agreed to go ahead with potential EGD/CSY through GI. GI discussed with pt and planned to proceed with procedures tomorrow with bowel prep in evening and NPO midnight with CLD today. Mg goal> 2--replete today.  - NS infusion started  by GI - Palliative spoke to pt, partner, Niece on Goals of Care--want full care at this time - CLD, Bowel prep in the evening and NPO overnight - Continue loperamide 4 mg TID  scheduled and psyllium daily  - Watch for refeeding syndrome, monitor mg, phosphorus  - Orthostatic vitals - PT/OT   #NAGMA #Chronic Diarrhea NAGMA improving likely 2/2 to chronic diarrhea. Potassium levels wnl. Will be monitored and repleted as needed.  Treating her diarrhea per above.   - BMP daily - K repleted today      #Acute on chronic kidney injury # CKD 4 Creatinine improved to 2.58 this AM. Likely pre-renal due to low intake and volume depletion with improvement after receiving fluid infusion yesterday. On NS infusion by GI   - NS infusion  - Trend serum creatinine   #Thyroid  nodule US  of thyroid  performed  which revealed several low risk nodules in the left mid-lower gland that do not meet the criteria for biopsy or imaging surveillance.    Chronic Conditions:    #Asthma - Breo ellipta daily   #Depression -  Continue  Mirtazapine   -  Will slowly transition her into starting other centrally acting meds. Will hold Seroquel  for now.    #GERD - Continue on home p.o. pantoprazole  40 mg   #Tobacco use disorder - Nicotine  patch daily   #Lower extremity psoriasis The patient self-administers Tremfya  1 mg every 8 weeks. She also uses  betamethasone dipropionate 0.05% topically as needed for her legs.   #Vertigo Continue to hold meclizine  25 mg 3 times daily as needed. Pt  did not complain of dizziness today. Will await on PT evaluation first to resume.     #Peripheral neuropathy No tenderness on BL LE PE. Asked about her gabapentin  use to which there was no response from pt and partner was not aware how much or when she was taking it or for what she was using it.  Kidney function continues to improve. Will resume gabapentin  on low dose if pt complains of BL LE tenderness  Best Practice: Diet: CLD IVF: NS infusion VTE: heparin  injection 5,000 Units Start: 03/13/24 1445 Code: Full   Disposition planning: DISPO: home pending clinical improvement   Signature:  Rebecka Edgardo [facility]  2:45 PM, 03/15/2024  On Call pager [phone]

## 2024-03-15 NOTE — Plan of Care (Signed)
   Problem: Coping: Goal: Level of anxiety will decrease Outcome: Progressing   Problem: Pain Managment: Goal: General experience of comfort will improve and/or be controlled Outcome: Progressing   Problem: Safety: Goal: Ability to remain free from injury will improve Outcome: Progressing

## 2024-03-15 NOTE — Progress Notes (Addendum)
 Daily Progress Note   Date: 03/15/2024   Patient Name: Whitney Parrish  DOB: February 26, 1956  MRN: [id number]  Age / Sex: 68 y.o., female  Attending Physician: Francesco Elsie NOVAK, MD Primary Care Physician: Phyllis Jereld BROCKS, NP Admit Date: 03/13/2024 Length of Stay: 1 day  Reason for Follow-up: Establishing goals of care  Past Medical History:  Diagnosis Date   Anxiety    Arthritis    shoulders and hands   Asthma    Benign positional vertigo    Chronically dry eyes    CKD (chronic kidney disease), stage III (HCC)    Depression    Dyspnea on exertion    GERD (gastroesophageal reflux disease)    Hematuria    History of acute pyelonephritis    History of Bell's palsy    1980's left side-- residual lip numbness intermittant   History of chest pain    non-cardiac per dr claudene notes (pt's pcp)   History of panic attacks    Hypertension    Left ureteral stone    Loose bowel movements    chronic-per pt residual from injury during abdominal surgery in 1986   Migraines    Nephrolithiasis    left    Psoriasis    Urgency of urination    Wears dentures    has upper and lower but only wears upper    Assessment & Plan:   HPI/Patient Profile:  68 y.o. female  with past medical history of CKD IV, hypokalemia, psoriasis, depression admitted on 03/13/2024 with AKI on CKD, severe protein calorie malnutrition, and acute metabolic encephalopathy. Patient most recently admitted 10/18-10/25 for a similar presentation with syncope and generalized weakness.  Palliative medicine consulted for goals of care conversation.   SUMMARY OF RECOMMENDATIONS Full code, full scope, will continue discussions on code status when appropriate Family and patient are agreeable to further work up including colonoscopy and EGD  Symptom Management:  Per primary team  Code Status: Full Code  Prognosis: Unable to determine  Discharge Planning: To Be Determined  Discussed with: Winfrey MD, Oneita  MD, partner Kristan), niece [name]), patient  Subjective:   Subjective: Chart Reviewed. Updates received. Patient Assessed. Created space and opportunity for patient  and family to explore thoughts and feelings regarding current medical situation.  Today's Discussion: Today before meeting with the patient/family, I reviewed the chart notes including GI consult note. I also reviewed vital signs, nursing flowsheets, medication administrations record, labs, and imaging.  Patient's mentation is improved compared to prior visit on 11/12 but remains slightly confused but able to participate in conversation during visit. Discussed overall health prior to current hospitalization, current admission, and decision making points regarding colonoscopy and EGD. Discussed possible risks of the procedures but deferred to GI to further explain to patient and family. Patient and family are in agreement to continue further work up to assess her chronic diarrhea. Did not discuss code status during this visit as patient does not have full capacity at this point and family is not ready to discuss at this time.   Patient and family continues to express that she would like to figure out why she has been feeling so fatigued and reiterated that her failure to thrive is most likely due to her poor PO intake as well as the chronic diarrhea.   Review of Systems  Unable to perform ROS  Objective:   Primary Diagnoses: Present on Admission:  Syncope  Gastroesophageal reflux disease  CKD (chronic kidney disease) stage  3, GFR 30-59 ml/min (HCC)  Protein-calorie malnutrition, severe  Acute renal failure  Chronic diarrhea   Vital Signs:  BP 108/68 (BP Location: Left Arm)   Pulse 81   Temp 98.1 F (36.7 C) (Oral)   Resp 17   Ht 5' (1.524 m)   Wt 32.7 kg   SpO2 100%   BMI 14.06 kg/m   Physical Exam HENT:     Head: Normocephalic.     Nose: Nose normal.  Eyes:     Extraocular Movements: Extraocular movements  intact.  Cardiovascular:     Rate and Rhythm: Normal rate.  Pulmonary:     Effort: Pulmonary effort is normal.  Neurological:     Mental Status: She is alert. She is disoriented.  Psychiatric:        Mood and Affect: Mood normal.    Palliative Assessment/Data: 40%   Existing Vynca/ACP Documentation: None  Thank you for allowing us  to participate in the care of Whitney Parrish PMT will continue to support holistically.  Time Total: 35 minutes  Detailed review of medical records (labs, imaging, vital signs), medically appropriate exam, discussed with treatment team, counseling and education to patient, family, & staff, documenting clinical information, medication management, coordination of care  Whitney Parrish  Palliative Medicine Team  Team Phone # [phone] (Nights/Weekends) 03/15/2024 10:02 AM

## 2024-03-15 NOTE — Progress Notes (Signed)
 Daily Progress Note  DOA: 03/13/2024 Hospital Day: 3  Cc:  diarrhea, failure to thrive  ASSESSMENT    68 yo female admitted with:  Weakness Failure to thrive with significant weight loss /  malnutrition Reported worsening of chronic loose stool.    AKI on CKD4 Slowly improving. Cr 2.58 / GRF 20   Chronic [place] anemia in setting of CKD Baseline hgb 9 to 10. It has fluctuated vastly over the last month in setting of volume depletion, iv hydration, lab draws, illness, ect. Doesn't appear iron deficient TODAY: Hgb stable overnight at 7.6. Has not required blood transfusions this admission ( or last)   Occasional small volume rectal bleeding with BMs Patient confused so history unreliable   Chronic GERD Continue home pantoprazole    Psoriatic arthritis Guselkumab  on home med list but apparently not taking it     Principal Problem:   Syncope Active Problems:   Dizziness   Gastroesophageal reflux disease   CKD (chronic kidney disease) stage 3, GFR 30-59 ml/min (HCC)   Protein-calorie malnutrition, severe   Acute renal failure   Chronic diarrhea   Weakness generalized   Pressure injury of skin   AKI (acute kidney injury)   Failure to thrive in adult   Hypoalbuminemia    PLAN   --Celiac studies, fecal elastase and fecal calprotectin still pending --Remeron  has been resumed.  --Palliative Medicine met with patient and family today. Plan is for full scope of care. Patient, niece and significant other [name] would like to proceed with endoscopic evaluation.  Hopefully she will tolerate the bowel prep --Schedule for EGD and colonoscopy. The risks and benefits of EGD with possible biopsies and colonoscopy with possible biopsies and removal of polyps were discussed with the patient who agrees to proceed.  Subjective   No physical complaints. No diarrhea today. Family visiting   Objective    Recent Labs    03/13/24 0830 03/13/24 0916 03/13/24 1202  03/14/24 0442 03/15/24 0539  WBC 9.4  --   --  8.1 8.6  HGB 9.9*   < > 9.9* 7.5* 7.6*  HCT 31.7*   < > 29.0* 23.5* 24.1*  MCV 106.4*  --   --  104.4* 103.4*  PLT 394  --   --  309 301   < > = values in this interval not displayed.   Recent Labs    03/13/24 1737 03/14/24 0442 03/15/24 0539  NA 142 143 140  K 4.1 3.4* 4.0  CL 121* 120* 116*  CO2 11* 12* 15*  GLUCOSE 131* 99 78  BUN 40* 34* 29*  CREATININE 3.26* 2.80* 2.58*  CALCIUM 8.8* 8.5* 8.3*   Recent Labs    03/13/24 1155 03/13/24 1737 03/15/24 0539  PROT 6.0* 6.1*  --   ALBUMIN 2.5* 2.6* 2.1*  AST 16 19  --   ALT 15 16  --   ALKPHOS 74 72  --   BILITOT 0.5 0.6  --      Imaging:  US  THYROID  CLINICAL DATA:  Goiter.  EXAM: THYROID  ULTRASOUND  TECHNIQUE: Ultrasound examination of the thyroid  gland and adjacent soft tissues was performed.  COMPARISON:  None Available.  FINDINGS: Parenchymal Echotexture: Normal  Isthmus: 0.3 cm  Right lobe: 4.6 x 1.8 x 1.3 cm  Left lobe: 4.2 x 1.8 x 2.0 cm  _________________________________________________________  Estimated total number of nodules >/= 1 cm: 3  Number of spongiform nodules >/=  2 cm not described below (TR1): 0  Number of mixed cystic and solid nodules >/= 1.5 cm not described below (TR2): 0  _________________________________________________________  At least 3 isoechoic predominantly solid nodules are present in the left mid and lower gland. None of the lesions measures larger than 1.4 cm. All are considered TI-RADS category 3. Given size (<1.4 cm) and appearance, this nodule does NOT meet TI-RADS criteria for biopsy or dedicated follow-up.  IMPRESSION: 1. Mildly heterogeneous and enlarged thyroid  gland. 2. Several sonographically low risk nodules are noted incidentally in the left mid and lower gland. None meet criteria to warrant biopsy or dedicated imaging surveillance.  The above is in keeping with the ACR TI-RADS recommendations -  J Am Coll Radiol 2017;14:587-595.  Electronically Signed   By: Wilkie Lent M.D.   On: 03/14/2024 12:38     Scheduled inpatient medications:   acidophilus  2 capsule Oral TID   ascorbic acid  500 mg Oral Daily   cholecalciferol  1,000 Units Oral Daily   cyanocobalamin  1,000 mcg Oral Daily   feeding supplement  1 Container Oral TID BM   feeding supplement  237 mL Oral BID BM   fluticasone furoate-vilanterol  1 puff Inhalation Daily   folic acid  1 mg Oral Daily   Gerhardt's butt cream   Topical BID   heparin   5,000 Units Subcutaneous Q8H   loperamide  4 mg Oral TID AC & HS   mirtazapine   7.5 mg Oral Daily   multivitamin with minerals  1 tablet Oral Daily   nicotine   14 mg Transdermal Daily   nutrition supplement (JUVEN)  1 packet Oral BID BM   psyllium  1 packet Oral Daily   thiamine  100 mg Oral Daily   vitamin A  10,000 Units Oral Daily   Vitamin D (Ergocalciferol)  50,000 Units Oral Q Wed   zinc sulfate (50mg  elemental zinc)  220 mg Oral Daily   Continuous inpatient infusions:   magnesium  sulfate bolus IVPB 4 g (03/15/24 1218)   PRN inpatient medications: acetaminophen , mouth rinse  Vital signs in last 24 hours: Temp:  [97.5 F (36.4 C)-98.2 F (36.8 C)] 98.1 F (36.7 C) (11/13 1109) Pulse Rate:  [67-86] 67 (11/13 1109) Resp:  [16-17] 17 (11/13 1109) BP: (102-108)/(67-72) 102/72 (11/13 1109) SpO2:  [95 %-100 %] 95 % (11/13 1109) Last BM Date : 03/13/24  Intake/Output Summary (Last 24 hours) at 03/15/2024 1228 Last data filed at 03/14/2024 1530 Gross per 24 hour  Intake 120 ml  Output --  Net 120 ml    Intake/Output from previous day: 11/12 0701 - 11/13 0700 In: 240 [P.O.:240] Out: -  Intake/Output this shift: No intake/output data recorded.   Physical Exam:  General: Alert thin female in NAD Heart:  Regular rate and rhythm.  Pulmonary: Normal respiratory effort Abdomen: Soft, nondistended, nontender. Normal bowel sounds. Extremities: No  lower extremity edema  Neurologic: Alert and oriented to place and person.  Psych: Pleasant. Cooperative     LOS: 1 day   Vina Dasen ,NP 03/15/2024, 12:28 PM

## 2024-03-16 DIAGNOSIS — K529 Noninfective gastroenteritis and colitis, unspecified: Secondary | ICD-10-CM | POA: Diagnosis not present

## 2024-03-16 DIAGNOSIS — E8721 Acute metabolic acidosis: Secondary | ICD-10-CM | POA: Diagnosis not present

## 2024-03-16 DIAGNOSIS — N184 Chronic kidney disease, stage 4 (severe): Secondary | ICD-10-CM | POA: Diagnosis not present

## 2024-03-16 DIAGNOSIS — N179 Acute kidney failure, unspecified: Secondary | ICD-10-CM | POA: Diagnosis not present

## 2024-03-16 LAB — RENAL FUNCTION PANEL
Albumin: 2.2 g/dL — ABNORMAL LOW (ref 3.5–5.0)
Anion gap: 11 (ref 5–15)
BUN: 26 mg/dL — ABNORMAL HIGH (ref 8–23)
CO2: 13 mmol/L — ABNORMAL LOW (ref 22–32)
Calcium: 8.6 mg/dL — ABNORMAL LOW (ref 8.9–10.3)
Chloride: 117 mmol/L — ABNORMAL HIGH (ref 98–111)
Creatinine, Ser: 2.3 mg/dL — ABNORMAL HIGH (ref 0.44–1.00)
GFR, Estimated: 22 mL/min — ABNORMAL LOW (ref >60)
Glucose, Bld: 74 mg/dL (ref 70–99)
Phosphorus: 2.9 mg/dL (ref 2.5–4.6)
Potassium: 4.8 mmol/L (ref 3.5–5.1)
Sodium: 141 mmol/L (ref 135–145)

## 2024-03-16 LAB — CBC
HCT: 25.9 % — ABNORMAL LOW (ref 36.0–46.0)
Hemoglobin: 8.5 g/dL — ABNORMAL LOW (ref 12.0–15.0)
MCH: 32.9 pg (ref 26.0–34.0)
MCHC: 32.8 g/dL (ref 30.0–36.0)
MCV: 100.4 fL — ABNORMAL HIGH (ref 80.0–100.0)
Platelets: 335 K/uL (ref 150–400)
RBC: 2.58 MIL/uL — ABNORMAL LOW (ref 3.87–5.11)
RDW: 16.4 % — ABNORMAL HIGH (ref 11.5–15.5)
WBC: 12.4 K/uL — ABNORMAL HIGH (ref 4.0–10.5)
nRBC: 1.5 % — ABNORMAL HIGH (ref 0.0–0.2)

## 2024-03-16 LAB — MAGNESIUM: Magnesium: 3.3 mg/dL — ABNORMAL HIGH (ref 1.7–2.4)

## 2024-03-16 LAB — GLIADIN ANTIBODIES, SERUM
Deamidated Gliadin Abs, IgA: 4 U (ref 0–19)
Deamidated Gliadin Abs, IgG: 2 U (ref 0–19)

## 2024-03-16 MED ORDER — SODIUM BICARBONATE 650 MG PO TABS
1300.0000 mg | ORAL_TABLET | Freq: Three times a day (TID) | ORAL | Status: DC
  Administered 2024-03-16 – 2024-03-19 (×6): 1300 mg via ORAL
  Filled 2024-03-16 (×12): qty 2

## 2024-03-16 MED ORDER — LOPERAMIDE HCL 2 MG PO CAPS
4.0000 mg | ORAL_CAPSULE | Freq: Three times a day (TID) | ORAL | Status: DC
  Administered 2024-03-17 – 2024-03-19 (×5): 4 mg via ORAL
  Filled 2024-03-16 (×9): qty 2

## 2024-03-16 MED ORDER — POLYETHYLENE GLYCOL 3350 17 GM/SCOOP PO POWD
238.0000 g | Freq: Once | ORAL | Status: AC
  Administered 2024-03-16: 238 g via ORAL
  Filled 2024-03-16: qty 238

## 2024-03-16 NOTE — Plan of Care (Signed)

## 2024-03-16 NOTE — Progress Notes (Signed)
 Physical Therapy Treatment Patient Details Name: Whitney Parrish MRN: [id number] DOB: [birth year] Today's Date: 03/16/2024   History of Present Illness Pt is a 68 y.o. female presenting 03/13/24 with syncope and generalized weakness for the past 2 weeks. Workup for failure to thrive, worsening chronic diarrhea, NAGMA, and acute metabolic encephalopathy. PMH: CKD4, hypokalemia, asthma, GERD, psoriasis, depression, and insomnia.  Of note, recent hospitalization 10/18-10/25 for syncopal episode found to have severe hypokalemia secondary to chronic diarrhea   PT Comments  Pt greeted supine in bed, pleasant and agreeable to PT session. She advanced OOB mobility engaging in gait training. Introduced RW and educated pt on proper and safe use of AD. Pt ambulated with a step-to gait pattern, NBOS, trunk flexed, and fixed gaze on the floor. She had a tendency to veer to the left requiring minA for stability/support and improved maneuvering of RW. Moderate multi-modal cues provided for improved sequencing/technique with minimal adjustments notes. Pt ambulated ~14ft before requiring a seated rest break. OT entering towards end of session to work with pt. Will continue to follow acutely and advance appropriately.      If plan is discharge home, recommend the following: A lot of help with bathing/dressing/bathroom;Assistance with cooking/housework;Assist for transportation;Help with stairs or ramp for entrance;Supervision due to cognitive status;A little help with walking and/or transfers;Direct supervision/assist for medications management   Can travel by private vehicle        Equipment Recommendations  Rollator (4 wheels);BSC/3in1;Other (comment) (transport chair)    Recommendations for Other Services       Precautions / Restrictions Precautions Precautions: Fall Recall of Precautions/Restrictions: Impaired Restrictions Weight Bearing Restrictions Per Provider Order: No     Mobility  Bed  Mobility Overal bed mobility: Needs Assistance Bed Mobility: Supine to Sit     Supine to sit: Min assist, HOB elevated, Used rails     General bed mobility comments: Pt sat up on R side of be with increased time. Assist to bring BLE off EOB, elevate trunk, and scoot hips fwd til feet flat.    Transfers Overall transfer level: Needs assistance Equipment used: Rolling walker (2 wheels) Transfers: Sit to/from Stand Sit to Stand: Min assist           General transfer comment: Introduced RW and educated pt on proper and safe use of AD. Cued proper hand/foot placement. Powered up with minA. Cues for pt to reach back for surface. Good eccentric control.    Ambulation/Gait Ambulation/Gait assistance: Min assist, +2 safety/equipment (Chair Follow) Gait Distance (Feet): 60 Feet (x2, seated rest break between bouts) Assistive device: Rolling walker (2 wheels) Gait Pattern/deviations: Step-to pattern, Decreased step length - right, Decreased step length - left, Decreased stride length, Narrow base of support, Trunk flexed, Drifts right/left Gait velocity: decreased Gait velocity interpretation: <1.31 ft/sec, indicative of household ambulator   General Gait Details: Pt ambulated with short slow steps and an extremely NBOS with her foot touching and sliding by the other to advance. Cued increase BOS to shoulder width apart, increase step lenght, and higher foot clearence with minimal adjustments noted. Pt maintained a fwd lean and fixed gaze on floor. Cues for upright posture, increase proximity to RW, and forward gaze. Pt had a tendency to veer to the left using RW. Assist for improved manuevering, safety, navigation, and stability.   Stairs             Wheelchair Mobility     Tilt Bed    Modified Rankin (Stroke Patients Only)  Balance Overall balance assessment: Needs assistance Sitting-balance support: No upper extremity supported, Feet supported Sitting  balance-Leahy Scale: Fair     Standing balance support: Bilateral upper extremity supported, During functional activity Standing balance-Leahy Scale: Poor Standing balance comment: pt dependent on RW                            Communication Communication Communication: No apparent difficulties  Cognition Arousal: Alert Behavior During Therapy: WFL for tasks assessed/performed   PT - Cognitive impairments: Orientation, Awareness, Memory, Sequencing, Problem solving, Safety/Judgement, Attention   Orientation impairments: Time, Situation                   PT - Cognition Comments: Pt pleasantly confused but able to participate in conversations. She has decreased insight into current situations and poor safety awareness. Pt demonstrated delayed processing. Following commands: Impaired Following commands impaired: Follows one step commands with increased time    Cueing Cueing Techniques: Verbal cues, Gestural cues, Tactile cues  Exercises      General Comments General comments (skin integrity, edema, etc.): Pt's boyfriend and boyfriend's grandson present and supportive in room.      Pertinent Vitals/Pain Pain Assessment Pain Assessment: Faces Faces Pain Scale: Hurts little more Pain Location: Generalized Pain Descriptors / Indicators: Sore Pain Intervention(s): Monitored during session, Limited activity within patient's tolerance, Repositioned    Home Living                          Prior Function            PT Goals (current goals can now be found in the care plan section) Acute Rehab PT Goals Patient Stated Goal: Return Home PT Goal Formulation: With family Time For Goal Achievement: 03/28/24 Potential to Achieve Goals: Fair Progress towards PT goals: Progressing toward goals    Frequency    Min 2X/week      PT Plan      Co-evaluation              AM-PAC PT 6 Clicks Mobility   Outcome Measure  Help needed turning  from your back to your side while in a flat bed without using bedrails?: A Little Help needed moving from lying on your back to sitting on the side of a flat bed without using bedrails?: A Little Help needed moving to and from a bed to a chair (including a wheelchair)?: A Little Help needed standing up from a chair using your arms (e.g., wheelchair or bedside chair)?: A Little Help needed to walk in hospital room?: A Little Help needed climbing 3-5 steps with a railing? : A Lot 6 Click Score: 17    End of Session Equipment Utilized During Treatment: Gait belt Activity Tolerance: Patient tolerated treatment well Patient left: in bed;Other (comment) (pt left seated EOB with OT entering room to begin session.) Nurse Communication: Mobility status PT Visit Diagnosis: Adult, failure to thrive (R62.7);Muscle weakness (generalized) (M62.81);Difficulty in walking, not elsewhere classified (R26.2);Unsteadiness on feet (R26.81)     Time: 8950-8894 PT Time Calculation (min) (ACUTE ONLY): 16 min  Charges:    $Gait Training: 8-22 mins PT General Charges $$ ACUTE PT VISIT: 1 Visit                     Randall SAUNDERS, PT, DPT Acute Rehabilitation Services Office: [phone] Secure Chat Preferred  Whitney Parrish 03/16/2024, 12:49  PM

## 2024-03-16 NOTE — Progress Notes (Addendum)
 HD#2 SUBJECTIVE:  Patient Summary: Whitney Parrish is a female with a medical history significant for CKD stage 4, hypokalemia, asthma, GERD, psoriasis, tobacco use disorder, depression, and insomnia, who presented with syncope and was admitted for further evaluation and management of a syncopal episode. Palliative and GI following to help with goals of care and further evaluation for a malabsorption condition, respectively   Overnight Events: none   Interim History:  Saw pt at bedside. Family not present today. Said she is feeling well. No CP or SOB. Pain in her abdomen which she reports is chronic.  OBJECTIVE:  Vital Signs: Vitals:   03/15/24 1109 03/15/24 1829 03/15/24 1951 03/16/24 0605  BP: 102/72 115/83 117/73 105/65  Pulse: 67 78  78  Resp: 17 15 17 16   Temp: 98.1 F (36.7 C) 98 F (36.7 C) 98.1 F (36.7 C) 97.7 F (36.5 C)  TempSrc: Oral Oral Oral Axillary  SpO2: 95% 100% 100% 100%  Weight:      Height:       Supplemental O2: Room Air SpO2: 100 %  Filed Weights   03/13/24 0800  Weight: 32.7 kg     Intake/Output Summary (Last 24 hours) at 03/16/2024 1046 Last data filed at 03/16/2024 0236 Gross per 24 hour  Intake 1164.53 ml  Output --  Net 1164.53 ml   Net IO Since Admission: 1,995.71 mL [03/16/24 1046]  Physical Exam: Physical Exam Constitutional:      Comments: Thin female resting comfortably in bed watching TV. Responding yes to most of the questions asked as she was looking at the TV.   Cardiovascular:     Rate and Rhythm: Normal rate.  Pulmonary:     Effort: Pulmonary effort is normal.     Breath sounds: Normal breath sounds.  Abdominal:     Palpations: Abdomen is soft.     Comments: No TTP  Musculoskeletal:        General: No tenderness.     Right lower leg: No edema.     Left lower leg: No edema.     Comments: Negative tenderness in BL LE  Neurological:     Mental Status: She is alert.     Patient Lines/Drains/Airways Status      Active Line/Drains/Airways     Name Placement date Placement time Site Days   Peripheral IV 03/14/24 22 G 1 Posterior;Right Forearm 03/14/24  2029  Forearm  2   Wound 03/13/24 1739 Pressure Injury Buttocks Right Stage 2 -  Partial thickness loss of dermis presenting as a shallow open injury with a red, pink wound bed without slough. 03/13/24  1739  Buttocks  3   Wound 03/14/24 Irritant Contact Dermatitis Buttocks Medial 03/14/24  --  Buttocks  2            Pertinent labs and imaging:     Latest Ref Rng & Units 03/16/2024    4:31 AM 03/15/2024    5:39 AM 03/14/2024    4:42 AM  CBC  WBC 4.0 - 10.5 K/uL 12.4  8.6  8.1   Hemoglobin 12.0 - 15.0 g/dL 8.5  7.6  7.5   Hematocrit 36.0 - 46.0 % 25.9  24.1  23.5   Platelets 150 - 400 K/uL 335  301  309        Latest Ref Rng & Units 03/16/2024    4:31 AM 03/15/2024    5:39 AM 03/14/2024    4:42 AM  CMP  Glucose 70 - 99  mg/dL 74  78  99   BUN 8 - 23 mg/dL 26  29  34   Creatinine 0.44 - 1.00 mg/dL 7.69  7.41  7.19   Sodium 135 - 145 mmol/L 141  140  143   Potassium 3.5 - 5.1 mmol/L 4.8  4.0  3.4   Chloride 98 - 111 mmol/L 117  116  120   CO2 22 - 32 mmol/L 13  15  12    Calcium 8.9 - 10.3 mg/dL 8.6  8.3  8.5     No results found.  ASSESSMENT/PLAN:  Assessment: Principal Problem:   Syncope Active Problems:   Dizziness   Gastroesophageal reflux disease   CKD (chronic kidney disease) stage 3, GFR 30-59 ml/min (HCC)   Protein-calorie malnutrition, severe   Acute renal failure   Chronic diarrhea   Weakness generalized   Pressure injury of skin   AKI (acute kidney injury)   Failure to thrive in adult   Hypoalbuminemia   Plan: #Syncopal episode #Generalized weakness #Chronic Diarrhea with interval worsening EGD/ CSY cancelled today. Finished most bowel prep yesterday but couldn't tolerate the rest. Her BM was mushy in the AM as reported by GI. Will continue with additional prep in the evening for. EGD/ colonoscopy with  Dr. Rollin tomorrow 11/15.   - CLD, Repeat Bowel prep in the evening and NPO overnight - CSY/EGD tomorrow with Dr. Rollin - Pause loperamide 4 mg TID  scheduled , restart after procedures -psyllium daily  - Watch for refeeding syndrome, monitor mg, phosphorus  - Orthostatic vitals - PT/OT -repleting several vitamins   #NAGMA #Chronic Diarrhea NAGMA improving likely 2/2 to chronic diarrhea. Started on Sodium bicarb. Potassium levels wnl. Will be monitored and repleted as needed. .   -sodium bicarbonate tablet 1,300 mg PO TID -Daily RFPs   #Acute on chronic kidney injury # CKD 4 Creatinine improving. Likely pre-renal due to low intake and volume depletion with improvement after receiving fluid infusion yesterday.    - Trend serum creatinine   #Thyroid  nodule US  of thyroid  performed  which revealed several low risk nodules in the left mid-lower gland that do not meet the criteria for biopsy or imaging surveillance.    Chronic Conditions:    #Asthma - Breo ellipta daily   #Depression #Impaired cognition due to medical illness vs dementia -  Continue  Mirtazapine   -  Avoid centrally acting meds   #GERD - Continue on home p.o. pantoprazole  40 mg   #Tobacco use disorder - Nicotine  patch daily   #Lower extremity psoriasis The patient self-administers Tremfya  1 mg every 8 weeks. She also uses betamethasone dipropionate 0.05% topically as needed for her legs.   #Vertigo Continue to hold meclizine  25 mg 3 times daily as needed. Pt did not complain of dizziness today.    #Peripheral neuropathy Continues with no tenderness on BL LE on PE. Will resume gabapentin  on low dose if pt complains of BL LE tenderness   Disposition planning: Diet: CLD IVF: None Code: Full   Disposition planning: DISPO: home pending clinical improvement   Signature:  Rebecka Edgardo [facility]  10:46 AM, 03/16/2024  On Call pager [phone]

## 2024-03-16 NOTE — Progress Notes (Signed)
 Patient has still one cup to finish her bowel prep last night.She cannot tolerate it anymore.Had BM 4x.

## 2024-03-16 NOTE — Plan of Care (Signed)
  Problem: Education: Goal: Knowledge of General Education information will improve Description: Including pain rating scale, medication(s)/side effects and non-pharmacologic comfort measures Outcome: Progressing   Problem: Clinical Measurements: Goal: Will remain free from infection Outcome: Progressing   Problem: Clinical Measurements: Goal: Diagnostic test results will improve Outcome: Progressing   Problem: Pain Managment: Goal: General experience of comfort will improve and/or be controlled Outcome: Progressing   Problem: Safety: Goal: Ability to remain free from injury will improve Outcome: Progressing   Problem: Skin Integrity: Goal: Risk for impaired skin integrity will decrease Outcome: Progressing

## 2024-03-16 NOTE — Progress Notes (Signed)
 Occupational Therapy Treatment Patient Details Name: Whitney Parrish MRN: [id number] DOB: [birth year] Today's Date: 03/16/2024   History of present illness Pt is a 68 y.o. female presenting 03/13/24 with syncope and generalized weakness for the past 2 weeks. Workup for failure to thrive, worsening chronic diarrhea, NAGMA, and acute metabolic encephalopathy. PMH: CKD4, hypokalemia, asthma, GERD, psoriasis, depression, and insomnia.  Of note, recent hospitalization 10/18-10/25 for syncopal episode found to have severe hypokalemia secondary to chronic diarrhea   OT comments  Pt with very poor memory and attention, needing multiple cues for task persistence during ADLs. Attends to limited visual field with ambulation and at sink during grooming. Pt needing up to min assist for UB dressing, toileting and grooming completed today. Boyfriend in room and aware pt will need very close supervision and assistance at all times when she returns home. Continue to recommend HHOT.       If plan is discharge home, recommend the following:  A little help with walking and/or transfers;A lot of help with bathing/dressing/bathroom;Assistance with cooking/housework;Direct supervision/assist for medications management;Direct supervision/assist for financial management;Assist for transportation;Help with stairs or ramp for entrance   Equipment Recommendations       Recommendations for Other Services      Precautions / Restrictions Precautions Precautions: Fall Recall of Precautions/Restrictions: Impaired Restrictions Weight Bearing Restrictions Per Provider Order: No       Mobility Bed Mobility Overal bed mobility: Needs Assistance         Sit to supine: Contact guard assist   General bed mobility comments: self assists R LE back into bed with her hands    Transfers Overall transfer level: Needs assistance Equipment used: Rolling walker (2 wheels) Transfers: Sit to/from Stand Sit to Stand: Min  assist, Contact guard assist           General transfer comment: cues for hand placement and safety, assist to rise and steady from bed, CGA from toilet with grab bar     Balance Overall balance assessment: Needs assistance   Sitting balance-Leahy Scale: Fair       Standing balance-Leahy Scale: Poor Standing balance comment: pt dependent on RW                           ADL either performed or assessed with clinical judgement   ADL Overall ADL's : Needs assistance/impaired     Grooming: Wash/dry hands;Standing;Minimal assistance;Brushing hair;Sitting;Set up           Upper Body Dressing : Minimal assistance;Sitting       Toilet Transfer: Minimal assistance;Ambulation;Rolling walker (2 wheels);Comfort height toilet;Grab bars   Toileting- Clothing Manipulation and Hygiene: Minimal assistance;Sit to/from stand Toileting - Clothing Manipulation Details (indicate cue type and reason): assist to manage gowns     Functional mobility during ADLs: Minimal assistance;Rolling walker (2 wheels)      Extremity/Trunk Assessment              Vision       Perception     Praxis     Communication Communication Communication: No apparent difficulties   Cognition Arousal: Alert Behavior During Therapy: WFL for tasks assessed/performed Cognition: Cognition impaired   Orientation impairments: Time, Situation Awareness: Intellectual awareness impaired, Online awareness impaired Memory impairment (select all impairments): Short-term memory, Working civil service fast streamer, Conservation officer, historic buildings Attention impairment (select first level of impairment): Sustained attention Executive functioning impairment (select all impairments): Organization, Sequencing, Reasoning, Problem solving  Following commands: Impaired Following commands impaired: Follows one step commands with increased time      Cueing   Cueing Techniques: Verbal cues, Gestural  cues, Tactile cues  Exercises      Shoulder Instructions       General Comments Pt's boyfriend is aware pt will need clow 24 hour care.    Pertinent Vitals/ Pain       Pain Assessment Pain Assessment: Faces Faces Pain Scale: Hurts little more Pain Location: Generalized Pain Descriptors / Indicators: Moaning Pain Intervention(s): Monitored during session, Repositioned  Home Living                                          Prior Functioning/Environment              Frequency  Min 2X/week        Progress Toward Goals  OT Goals(current goals can now be found in the care plan section)  Progress towards OT goals: Progressing toward goals  Acute Rehab OT Goals OT Goal Formulation: With patient Time For Goal Achievement: 03/28/24 Potential to Achieve Goals: Good  Plan      Co-evaluation                 AM-PAC OT 6 Clicks Daily Activity     Outcome Measure   Help from another person eating meals?: None Help from another person taking care of personal grooming?: A Little Help from another person toileting, which includes using toliet, bedpan, or urinal?: A Little Help from another person bathing (including washing, rinsing, drying)?: A Lot Help from another person to put on and taking off regular upper body clothing?: A Little Help from another person to put on and taking off regular lower body clothing?: A Lot 6 Click Score: 17    End of Session Equipment Utilized During Treatment: Gait belt;Rolling walker (2 wheels)  OT Visit Diagnosis: Unsteadiness on feet (R26.81);History of falling (Z91.81);Muscle weakness (generalized) (M62.81);Other symptoms and signs involving cognitive function   Activity Tolerance Patient tolerated treatment well   Patient Left in bed;with call bell/phone within reach;with bed alarm set;with family/visitor present   Nurse Communication          Time: 8894-8881 OT Time Calculation (min): 13  min  Charges: OT General Charges $OT Visit: 1 Visit OT Treatments $Self Care/Home Management : 8-22 mins Mliss HERO, OTR/L Acute Rehabilitation Services Office: [phone]   Kennth Mliss Helling 03/16/2024, 2:08 PM

## 2024-03-16 NOTE — Progress Notes (Signed)
 Patient was scheduled for EGD / colonoscopy today. Finished most of her bowel prep but couldn't tolerate the rest. Having BM now which is mushy. Unable to proceed with colonoscopy, will need additional prep. Will give clear liquids today, more bowel prep this evening and NPO after MN. EGD / colonoscopy with Dr. Rollin tomorrow. I updated her Niece by phone

## 2024-03-17 ENCOUNTER — Encounter (HOSPITAL_COMMUNITY)
Admission: EM | Disposition: A | Discharge status: Home Health | Payer: Self-pay | Source: Home / Self Care | Attending: Internal Medicine

## 2024-03-17 ENCOUNTER — Inpatient Hospital Stay (HOSPITAL_COMMUNITY): Admitting: Certified Registered Nurse Anesthetist

## 2024-03-17 DIAGNOSIS — N184 Chronic kidney disease, stage 4 (severe): Secondary | ICD-10-CM

## 2024-03-17 DIAGNOSIS — K317 Polyp of stomach and duodenum: Secondary | ICD-10-CM | POA: Diagnosis not present

## 2024-03-17 DIAGNOSIS — I129 Hypertensive chronic kidney disease with stage 1 through stage 4 chronic kidney disease, or unspecified chronic kidney disease: Secondary | ICD-10-CM | POA: Diagnosis not present

## 2024-03-17 HISTORY — PX: ESOPHAGOGASTRODUODENOSCOPY: SHX5428

## 2024-03-17 LAB — CBC
HCT: 26.2 % — ABNORMAL LOW (ref 36.0–46.0)
Hemoglobin: 8.5 g/dL — ABNORMAL LOW (ref 12.0–15.0)
MCH: 32.6 pg (ref 26.0–34.0)
MCHC: 32.4 g/dL (ref 30.0–36.0)
MCV: 100.4 fL — ABNORMAL HIGH (ref 80.0–100.0)
Platelets: 346 K/uL (ref 150–400)
RBC: 2.61 MIL/uL — ABNORMAL LOW (ref 3.87–5.11)
RDW: 16.3 % — ABNORMAL HIGH (ref 11.5–15.5)
WBC: 12.1 K/uL — ABNORMAL HIGH (ref 4.0–10.5)
nRBC: 0.8 % — ABNORMAL HIGH (ref 0.0–0.2)

## 2024-03-17 LAB — RENAL FUNCTION PANEL
Albumin: 2.1 g/dL — ABNORMAL LOW (ref 3.5–5.0)
Anion gap: 12 (ref 5–15)
BUN: 22 mg/dL (ref 8–23)
CO2: 18 mmol/L — ABNORMAL LOW (ref 22–32)
Calcium: 8.2 mg/dL — ABNORMAL LOW (ref 8.9–10.3)
Chloride: 115 mmol/L — ABNORMAL HIGH (ref 98–111)
Creatinine, Ser: 2.18 mg/dL — ABNORMAL HIGH (ref 0.44–1.00)
GFR, Estimated: 24 mL/min — ABNORMAL LOW (ref >60)
Glucose, Bld: 78 mg/dL (ref 70–99)
Phosphorus: 3.1 mg/dL (ref 2.5–4.6)
Potassium: 4.1 mmol/L (ref 3.5–5.1)
Sodium: 145 mmol/L (ref 135–145)

## 2024-03-17 LAB — TISSUE TRANSGLUTAMINASE, IGA: Tissue Transglutaminase Ab, IgA: <2 U/mL (ref 0–3)

## 2024-03-17 LAB — IGA: IgA: 537 mg/dL — ABNORMAL HIGH (ref 87–352)

## 2024-03-17 LAB — MAGNESIUM: Magnesium: 2.6 mg/dL — ABNORMAL HIGH (ref 1.7–2.4)

## 2024-03-17 SURGERY — EGD (ESOPHAGOGASTRODUODENOSCOPY)
Anesthesia: Monitor Anesthesia Care

## 2024-03-17 MED ORDER — FLUCONAZOLE 200 MG PO TABS
200.0000 mg | ORAL_TABLET | Freq: Every day | ORAL | Status: DC
  Administered 2024-03-17 – 2024-03-18 (×2): 200 mg via ORAL
  Filled 2024-03-17 (×5): qty 1

## 2024-03-17 MED ORDER — SODIUM CHLORIDE 0.9 % IV SOLN: INTRAVENOUS | Status: DC | PRN

## 2024-03-17 MED ORDER — PROPOFOL 500 MG/50ML IV EMUL
INTRAVENOUS | Status: DC | PRN
  Administered 2024-03-17: 150 ug/kg/min via INTRAVENOUS

## 2024-03-17 MED ORDER — LIDOCAINE 2% (20 MG/ML) 5 ML SYRINGE
INTRAMUSCULAR | Status: DC | PRN
  Administered 2024-03-17: 40 mg via INTRAVENOUS

## 2024-03-17 MED ORDER — PROPOFOL 10 MG/ML IV BOLUS
INTRAVENOUS | Status: DC | PRN
Start: 2024-03-17 — End: 2024-03-17
  Administered 2024-03-17 (×3): 20 mg via INTRAVENOUS

## 2024-03-17 NOTE — Plan of Care (Signed)

## 2024-03-17 NOTE — Progress Notes (Signed)
 Patient is non compliant with drinking her bowel prep. Page MD Bernadine ,made aware.

## 2024-03-17 NOTE — Progress Notes (Signed)
 HD#3 SUBJECTIVE:  Patient Summary: Whitney Parrish is a female with a medical history significant for CKD stage 4, hypokalemia, asthma, GERD, psoriasis, tobacco use disorder, depression, and insomnia, who presented with syncope and was admitted for further evaluation and management of a syncopal episode. Palliative and GI following to help with goals of care and further evaluation for malabsorption condition, respectively.   Overnight Events: none   Interim History:  Saw pt at bedside. Doesn't remember that she underwent procedures. Says she's feeling fine. Reports some pain with swallowing.   OBJECTIVE:  Vital Signs: Vitals:   03/17/24 1444 03/17/24 1450 03/17/24 1500 03/17/24 1510  BP: 112/67 118/72 116/62 112/67  Pulse: 93 97 91 91  Resp: 20 19 17  (!) 23  Temp: (!) 97.4 F (36.3 C)     TempSrc: Temporal     SpO2: 100% 100% 100% 100%  Weight:      Height:       Supplemental O2: Room Air SpO2: 100 % O2 Flow Rate (L/min): 3 L/min  Filed Weights   03/13/24 0800  Weight: 32.7 kg    No intake or output data in the 24 hours ending 03/17/24 1607 Net IO Since Admission: 2,235.71 mL [03/17/24 1607]  Physical Exam: Physical Exam Constitutional:      Comments: Thin, cachectic woman, resting comfortably in bed  HENT:     Head: Normocephalic.  Cardiovascular:     Rate and Rhythm: Normal rate and regular rhythm.  Pulmonary:     Effort: Pulmonary effort is normal.     Breath sounds: Normal breath sounds.  Abdominal:     Palpations: Abdomen is soft.     Tenderness: There is no abdominal tenderness. There is no guarding or rebound.  Musculoskeletal:     Right lower leg: No edema.     Left lower leg: No edema.  Skin:    General: Skin is warm.     Comments: Skin tenting noted in the chest area   Neurological:     Mental Status: She is alert.     Comments: Was not aware of where she was, but remembered later she was at [facility] after being told once. A&O to self but not year      Patient Lines/Drains/Airways Status     Active Line/Drains/Airways     Name Placement date Placement time Site Days   Peripheral IV 03/14/24 22 G 1 Posterior;Right Forearm 03/14/24  2029  Forearm  3   Wound 03/13/24 1739 Pressure Injury Buttocks Right Stage 2 -  Partial thickness loss of dermis presenting as a shallow open injury with a red, pink wound bed without slough. 03/13/24  1739  Buttocks  4   Wound 03/14/24 Irritant Contact Dermatitis Buttocks Medial 03/14/24  --  Buttocks  3            Pertinent labs and imaging:     Latest Ref Rng & Units 03/17/2024    5:07 AM 03/16/2024    4:31 AM 03/15/2024    5:39 AM  CBC  WBC 4.0 - 10.5 K/uL 12.1  12.4  8.6   Hemoglobin 12.0 - 15.0 g/dL 8.5  8.5  7.6   Hematocrit 36.0 - 46.0 % 26.2  25.9  24.1   Platelets 150 - 400 K/uL 346  335  301        Latest Ref Rng & Units 03/17/2024    5:07 AM 03/16/2024    4:31 AM 03/15/2024    5:39 AM  CMP  Glucose 70 - 99 mg/dL 78  74  78   BUN 8 - 23 mg/dL 22  26  29    Creatinine 0.44 - 1.00 mg/dL 7.81  7.69  7.41   Sodium 135 - 145 mmol/L 145  141  140   Potassium 3.5 - 5.1 mmol/L 4.1  4.8  4.0   Chloride 98 - 111 mmol/L 115  117  116   CO2 22 - 32 mmol/L 18  13  15    Calcium 8.9 - 10.3 mg/dL 8.2  8.6  8.3     No results found.  ASSESSMENT/PLAN:  Assessment: Principal Problem:   Syncope Active Problems:   Dizziness   Gastroesophageal reflux disease   CKD (chronic kidney disease) stage 3, GFR 30-59 ml/min (HCC)   Protein-calorie malnutrition, severe   Acute renal failure   Chronic diarrhea   Weakness generalized   Pressure injury of skin   AKI (acute kidney injury)   Failure to thrive in adult   Hypoalbuminemia   Plan: #Syncopal episode #Generalized weakness #Chronic Diarrhea with interval worsening Underwent EGD today. Tolerated the procedure well.  Esophageal plaques found, suspicious for candidiasis-biopsied.  GI started patient on fluconazole . Benign-appearing  esophageal stenosis.  Erosive gastropathy, with no recent bleeds, biopsied.  3 duodenal polyps, resected and retrieved. Will continue CLD and await on pathology results   - Underwent EGD today -Started fluconazole  200 mg daily for suspicion for candidiasis -Path results from esophagus, duodenum pending - Resume loperamide 4 mg TID  scheduled  -psyllium daily  - Watch for refeeding syndrome, monitor mg, phosphorus  - Orthostatic vitals - PT/OT -repleting several vitamins   #NAGMA #Chronic Diarrhea NAGMA improving likely 2/2 to chronic diarrhea.Potassium levels wnl. Will be monitored and repleted as needed. .   -sodium bicarbonate tablet 1,300 mg PO TID -Daily RFPs -Loperamide, as above   #Acute on chronic kidney injury # CKD 4 Creatinine improving. Likely pre-renal due to low intake and volume depletion. Continue with fluids and encourage adequate oral intake   - Trend serum creatinine   #Thyroid  nodule US  of thyroid  performed  which revealed several low risk nodules in the left mid-lower gland that do not meet the criteria for biopsy or imaging surveillance.    Chronic Conditions:    #Asthma - Breo ellipta daily   #Depression #Impaired cognition due to medical illness vs dementia -  Continue  Mirtazapine   -  Avoid centrally acting meds   #GERD - Continue on home p.o. pantoprazole  40 mg   #Tobacco use disorder - Nicotine  patch daily   #Lower extremity psoriasis The patient self-administers Tremfya  1 mg every 8 weeks. She also uses betamethasone dipropionate 0.05% topically as needed for her legs.   #Vertigo Continue to hold meclizine  25 mg 3 times daily as needed. Pt did not complain of dizziness today.    #Peripheral neuropathy Continues with no tenderness on BL LE on PE. Will resume gabapentin  on low dose if pt complains of BL LE tenderness    Best Practice: Diet: CLD IVF: None Code: Full   Disposition planning: DISPO: home pending clinical improvement    Signature:  Rebecka Edgardo [facility]  4:07 PM, 03/17/2024  On Call pager [phone]

## 2024-03-17 NOTE — Plan of Care (Signed)
 Pt came from endoscopy. MD at bedside

## 2024-03-17 NOTE — Transfer of Care (Signed)
 Immediate Anesthesia Transfer of Care Note  Patient: Whitney Parrish  Procedure(s) Performed: COLONOSCOPY EGD (ESOPHAGOGASTRODUODENOSCOPY)  Patient Location: Endoscopy Unit  Anesthesia Type:MAC  Level of Consciousness: drowsy  Airway & Oxygen Therapy: Patient Spontanous Breathing and Patient connected to nasal cannula oxygen  Post-op Assessment: Report given to RN and Post -op Vital signs reviewed and stable  Post vital signs: Reviewed and stable  Last Vitals:  Vitals Value Taken Time  BP    Temp    Pulse    Resp    SpO2      Last Pain:  Vitals:   03/17/24 1146  TempSrc: Temporal  PainSc: 0-No pain         Complications: No notable events documented.

## 2024-03-17 NOTE — Anesthesia Preprocedure Evaluation (Signed)
 Anesthesia Evaluation  Patient identified by MRN, date of birth, ID band Patient awake    Reviewed: Allergy & Precautions, H&P , NPO status , Patient's Chart, lab work & pertinent test results  Airway Mallampati: II   Neck ROM: full    Dental   Pulmonary asthma , Current Smoker   breath sounds clear to auscultation       Cardiovascular hypertension,  Rhythm:regular Rate:Normal     Neuro/Psych  Headaches PSYCHIATRIC DISORDERS Anxiety Depression       GI/Hepatic ,GERD  ,,  Endo/Other    Renal/GU Renal InsufficiencyRenal disease     Musculoskeletal  (+) Arthritis ,    Abdominal   Peds  Hematology  (+) Blood dyscrasia, anemia   Anesthesia Other Findings   Reproductive/Obstetrics                              Anesthesia Physical Anesthesia Plan  ASA: 3  Anesthesia Plan: MAC   Post-op Pain Management:    Induction: Intravenous  PONV Risk Score and Plan: 1 and Propofol  infusion and Treatment may vary due to age or medical condition  Airway Management Planned: Simple Face Mask  Additional Equipment:   Intra-op Plan:   Post-operative Plan:   Informed Consent: I have reviewed the patients History and Physical, chart, labs and discussed the procedure including the risks, benefits and alternatives for the proposed anesthesia with the patient or authorized representative who has indicated his/her understanding and acceptance.     Dental advisory given  Plan Discussed with: CRNA, Anesthesiologist and Surgeon  Anesthesia Plan Comments:         Anesthesia Quick Evaluation

## 2024-03-17 NOTE — Plan of Care (Signed)
   Medical records reviewed including progress notes, labs, imaging.  EGD was completed today given inability to do prep yesterday.    EGD results showed esophageal plaques suspicious for candidiasis, benign-appearing esophageal stenosis, 2 cm hiatal hernia, erosive gastropathy with no stigmata of recent bleeding, three duodenal polyps. Biopsies were obtained.   PMT plans to follow back up on Monday 11/17 for ongoing goals of care discussions.  Thank you for your referral and allowing PMT to assist in Whitney Parrish's care.   Ren Aspinall, PA-C Palliative Medicine Team  Team Phone # [phone]   NO CHARGE

## 2024-03-17 NOTE — Op Note (Signed)
 [facility] Patient Name: Whitney Parrish Procedure Date : 03/17/2024 MRN: [id number] Attending MD: Belvie Just , MD, [id number] Date of Birth: [birth year] CSN: [id number] Age: 68 Admit Type: Inpatient Procedure:                Upper GI endoscopy Indications:              Diarrhea, Weight loss Providers:                Belvie Just, MD, Robie Breed, RN, Fairy Marina, Technician Referring MD:              Medicines:                Propofol  per Anesthesia Complications:            No immediate complications. Estimated Blood Loss:     Estimated blood loss: none. Procedure:                Pre-Anesthesia Assessment:                           - Prior to the procedure, a History and Physical                            was performed, and patient medications and                            allergies were reviewed. The patient's tolerance of                            previous anesthesia was also reviewed. The risks                            and benefits of the procedure and the sedation                            options and risks were discussed with the patient.                            All questions were answered, and informed consent                            was obtained. Prior Anticoagulants: The patient has                            taken no anticoagulant or antiplatelet agents. ASA                            Grade Assessment: III - A patient with severe                            systemic disease. After reviewing the risks and  benefits, the patient was deemed in satisfactory                            condition to undergo the procedure.                           - Sedation was administered by an anesthesia                            professional. Deep sedation was attained.                           After obtaining informed consent, the endoscope was                            passed under direct  vision. Throughout the                            procedure, the patient's blood pressure, pulse, and                            oxygen saturations were monitored continuously. The                            PCF-HQ190L ([id number]) Olympus colonoscope was                            introduced through the mouth, and advanced to the                            second part of duodenum. The upper GI endoscopy was                            accomplished without difficulty. The patient                            tolerated the procedure well. Scope In: Scope Out: Findings:      Diffuse, white plaques were found in the entire esophagus. Biopsies were       taken with a cold forceps for histology.      One benign-appearing, intrinsic mild stenosis was found at the       gastroesophageal junction. This stenosis measured 1.2 cm (inner       diameter) x less than one cm (in length). The stenosis was traversed.      A 2 cm hiatal hernia was present.      A single localized 10 mm erosion with no stigmata of recent bleeding was       found in the gastric fundus. Biopsies were taken with a cold forceps for       histology.      Three 7 mm sessile polyps with no bleeding were found in the second       portion of the duodenum. The polyp was removed with a hot snare.       Resection and retrieval were complete. To prevent bleeding       post-intervention, two hemostatic clips  were successfully placed (MR       safe). Clip manufacturer: [employer]. There was no bleeding at       the end of the procedure.      Normal mucosa was found in the duodenal bulb, in the second portion of       the duodenum and in the third portion of the duodenum. Biopsies for       histology were taken with a cold forceps for evaluation of celiac       disease.      In the esophagus there was the gross appearance of a diffuse Candidal       esophagitis. Biopsies were obtained. In the distal esophagus there was       evidence  of a mild stricture and this was dilated with passage of the       pediatric colonoscopy. In the fundus a large superficial erosion was       found and it was biopsied. The duodenal mucosa appeared normal overall,       but three polyps were noted in the second portion of the duodenum. The       largest one, measuring 7 mm was removed with a hot snare and two       hemoclips were deployed to secure the site. The other two smaller polyps       were not able to be found again as there was marked small bowel       contraction and some patient tolerance issues.      The colonoscopy was not performed as she had thick semi-liquid stool       leaking into her diapers. She only drank a quarter of the reprep. Impression:               - Esophageal plaques were found, suspicious for                            candidiasis. Biopsied.                           - Benign-appearing esophageal stenosis.                           - 2 cm hiatal hernia.                           - Erosive gastropathy with no stigmata of recent                            bleeding. Biopsied.                           - Three duodenal polyps. Resected and retrieved.                            Clip manufacturer: [employer]. Clips (MR                            safe) were placed.                           - Normal mucosa was found  in the duodenal bulb, in                            the second portion of the duodenum and in the third                            portion of the duodenum. Biopsied. Recommendation:           - Return patient to hospital ward for ongoing care.                           - Clear liquid diet.                           - Continue present medications.                           - Await pathology results.                           - Start on fluconazole . Procedure Code(s):        --- Professional ---                           [phone], Esophagogastroduodenoscopy, flexible,                             transoral; with removal of tumor(s), polyp(s), or                            other lesion(s) by snare technique                           43239, 59, Esophagogastroduodenoscopy, flexible,                            transoral; with biopsy, single or multiple Diagnosis Code(s):        --- Professional ---                           K31.7, Polyp of stomach and duodenum                           K22.9, Disease of esophagus, unspecified                           K22.2, Esophageal obstruction                           K44.9, Diaphragmatic hernia without obstruction or                            gangrene                           K31.89, Other diseases of stomach and duodenum  R19.7, Diarrhea, unspecified                           R63.4, Abnormal weight loss CPT copyright 2022 American [employer] Association. All rights reserved. The codes documented in this report are preliminary and upon coder review may  be revised to meet current compliance requirements. Belvie Just, MD Belvie Just, MD 03/17/2024 2:42:40 PM This report has been signed electronically. Number of Addenda: 0

## 2024-03-17 NOTE — Plan of Care (Signed)
   Problem: Clinical Measurements: Goal: Ability to maintain clinical measurements within normal limits will improve Outcome: Progressing   Problem: Clinical Measurements: Goal: Will remain free from infection Outcome: Progressing

## 2024-03-18 ENCOUNTER — Encounter (HOSPITAL_COMMUNITY): Payer: Self-pay | Admitting: Gastroenterology

## 2024-03-18 DIAGNOSIS — E43 Unspecified severe protein-calorie malnutrition: Secondary | ICD-10-CM | POA: Diagnosis not present

## 2024-03-18 DIAGNOSIS — R55 Syncope and collapse: Secondary | ICD-10-CM

## 2024-03-18 DIAGNOSIS — N179 Acute kidney failure, unspecified: Secondary | ICD-10-CM | POA: Diagnosis not present

## 2024-03-18 DIAGNOSIS — R627 Adult failure to thrive: Secondary | ICD-10-CM | POA: Diagnosis not present

## 2024-03-18 LAB — BASIC METABOLIC PANEL WITH GFR
Anion gap: 12 (ref 5–15)
BUN: 28 mg/dL — ABNORMAL HIGH (ref 8–23)
CO2: 20 mmol/L — ABNORMAL LOW (ref 22–32)
Calcium: 8.3 mg/dL — ABNORMAL LOW (ref 8.9–10.3)
Chloride: 112 mmol/L — ABNORMAL HIGH (ref 98–111)
Creatinine, Ser: 2.16 mg/dL — ABNORMAL HIGH (ref 0.44–1.00)
GFR, Estimated: 24 mL/min — ABNORMAL LOW (ref >60)
Glucose, Bld: 92 mg/dL (ref 70–99)
Potassium: 3.7 mmol/L (ref 3.5–5.1)
Sodium: 144 mmol/L (ref 135–145)

## 2024-03-18 LAB — CBC
HCT: 22.5 % — ABNORMAL LOW (ref 36.0–46.0)
Hemoglobin: 7.2 g/dL — ABNORMAL LOW (ref 12.0–15.0)
MCH: 32.3 pg (ref 26.0–34.0)
MCHC: 32 g/dL (ref 30.0–36.0)
MCV: 100.9 fL — ABNORMAL HIGH (ref 80.0–100.0)
Platelets: 277 K/uL (ref 150–400)
RBC: 2.23 MIL/uL — ABNORMAL LOW (ref 3.87–5.11)
RDW: 16 % — ABNORMAL HIGH (ref 11.5–15.5)
WBC: 10.3 K/uL (ref 4.0–10.5)
nRBC: 0.6 % — ABNORMAL HIGH (ref 0.0–0.2)

## 2024-03-18 LAB — MAGNESIUM: Magnesium: 1.9 mg/dL (ref 1.7–2.4)

## 2024-03-18 LAB — RENAL FUNCTION PANEL
Albumin: 1.9 g/dL — ABNORMAL LOW (ref 3.5–5.0)
Anion gap: 18 — ABNORMAL HIGH (ref 5–15)
BUN: 21 mg/dL (ref 8–23)
CO2: 19 mmol/L — ABNORMAL LOW (ref 22–32)
Calcium: 8.1 mg/dL — ABNORMAL LOW (ref 8.9–10.3)
Chloride: 113 mmol/L — ABNORMAL HIGH (ref 98–111)
Creatinine, Ser: 2.11 mg/dL — ABNORMAL HIGH (ref 0.44–1.00)
GFR, Estimated: 25 mL/min — ABNORMAL LOW
Glucose, Bld: 74 mg/dL (ref 70–99)
Phosphorus: 3.3 mg/dL (ref 2.5–4.6)
Potassium: 3.6 mmol/L (ref 3.5–5.1)
Sodium: 150 mmol/L — ABNORMAL HIGH (ref 135–145)

## 2024-03-18 NOTE — Progress Notes (Signed)
 HD#4 SUBJECTIVE:  Patient Summary: Whitney Parrish is a female with a medical history significant for CKD stage 4, hypokalemia, asthma, GERD, psoriasis, tobacco use disorder, depression, and insomnia, who presented with syncope and was admitted for further evaluation and management of a syncopal episode. Palliative and GI following to help with goals of care and further evaluation for malabsorption condition, respectively. Underwent EGD on 11/15. Found to have plaques in esophagus suspicious for candidiasis. Started on fluconazole . Waiting on pathology results.    Overnight Events: None   Interim History:  Saw pt at bedside. Repots feeling well. Some pain with swallowing remains the same.   OBJECTIVE:  Vital Signs: Vitals:   03/17/24 2135 03/17/24 2238 03/18/24 0542 03/18/24 0820  BP: 120/73 106/69 (!) 118/58   Pulse: 91 83 73 73  Resp: 14 14 17 15   Temp: 99 F (37.2 C) 98.3 F (36.8 C) 98.4 F (36.9 C)   TempSrc: Oral Oral Oral   SpO2: 99% 100% 100% 98%  Weight:      Height:       Supplemental O2: Room Air SpO2: 98 % O2 Flow Rate (L/min): 3 L/min  Filed Weights   03/13/24 0800  Weight: 32.7 kg    No intake or output data in the 24 hours ending 03/18/24 0957 Net IO Since Admission: 2,235.71 mL [03/18/24 0957]  Physical Exam: Physical Exam Constitutional:      Comments: Cachectic woman resting comfortably in bed   HENT:     Head: Normocephalic.  Cardiovascular:     Rate and Rhythm: Normal rate and regular rhythm.  Pulmonary:     Effort: Pulmonary effort is normal.     Breath sounds: Normal breath sounds.  Musculoskeletal:     Right lower leg: No edema.     Left lower leg: No edema.  Neurological:     Mental Status: She is alert.     Patient Lines/Drains/Airways Status     Active Line/Drains/Airways     Name Placement date Placement time Site Days   Peripheral IV 03/14/24 22 G 1 Posterior;Right Forearm 03/14/24  2029  Forearm  4   Wound 03/13/24 1739  Pressure Injury Buttocks Right Stage 2 -  Partial thickness loss of dermis presenting as a shallow open injury with a red, pink wound bed without slough. 03/13/24  1739  Buttocks  5   Wound 03/14/24 Irritant Contact Dermatitis Buttocks Medial 03/14/24  --  Buttocks  4            Pertinent labs and imaging:      Latest Ref Rng & Units 03/18/2024    7:32 AM 03/17/2024    5:07 AM 03/16/2024    4:31 AM  CBC  WBC 4.0 - 10.5 K/uL 10.3  12.1  12.4   Hemoglobin 12.0 - 15.0 g/dL 7.2  8.5  8.5   Hematocrit 36.0 - 46.0 % 22.5  26.2  25.9   Platelets 150 - 400 K/uL 277  346  335        Latest Ref Rng & Units 03/18/2024    7:32 AM 03/17/2024    5:07 AM 03/16/2024    4:31 AM  CMP  Glucose 70 - 99 mg/dL 74  78  74   BUN 8 - 23 mg/dL 21  22  26    Creatinine 0.44 - 1.00 mg/dL 7.88  7.81  7.69   Sodium 135 - 145 mmol/L 150  145  141   Potassium 3.5 - 5.1 mmol/L  3.6  4.1  4.8   Chloride 98 - 111 mmol/L 113  115  117   CO2 22 - 32 mmol/L 19  18  13    Calcium 8.9 - 10.3 mg/dL 8.1  8.2  8.6     No results found.  ASSESSMENT/PLAN:  Assessment: Principal Problem:   Syncope Active Problems:   Dizziness   Gastroesophageal reflux disease   CKD (chronic kidney disease) stage 3, GFR 30-59 ml/min (HCC)   Protein-calorie malnutrition, severe   Acute renal failure   Chronic diarrhea   Weakness generalized   Pressure injury of skin   AKI (acute kidney injury)   Failure to thrive in adult   Hypoalbuminemia   Plan: #Syncopal episode #Generalized weakness #Chronic Diarrhea with interval worsening Underwent EGD on 11/15. Tolerated the procedure well.  Esophageal plaques found, suspicious for candidiasis-biopsied.  GI started patient on fluconazole . Benign-appearing esophageal stenosis.  Erosive gastropathy, with no recent bleeds, biopsied.  3 duodenal polyps, resected and retrieved. Will continue CLD and await on pathology results. Will wait for GI to perform CSY whenever pt is ready for  further evaluation of diarrhea.  - Pending CSY - Underwent EGD 11/15 -Started fluconazole  200 mg daily for suspicion for candidiasis -Path results from esophagus, duodenum pending -GI on board, appreciate recommendations  - Resume loperamide 4 mg TID  scheduled  -psyllium daily  - Watch for refeeding syndrome, monitor mg, phosphorus  - Orthostatic vitals - PT/OT -repleting several vitamins   #NAGMA #Chronic Diarrhea Anion gap elevated likely 2/2 to diarrhea as her loperamide was paused for EGD till yesterday. Potassium levels wnl. Will be monitored and repleted as needed. .   -sodium bicarbonate tablet 1,300 mg PO TID -Daily RFPs -Loperamide, as above    #Acute on chronic kidney injury # CKD 4 Creatinine improving. Likely pre-renal due to low intake and volume depletion. Continue with PO fluids and encourage adequate oral intake   - Trend serum creatinine   #Thyroid  nodule US  of thyroid  performed  which revealed several low risk nodules in the left mid-lower gland that do not meet the criteria for biopsy or imaging surveillance.    Chronic Conditions:    #Asthma - Breo ellipta daily   #Depression #Impaired cognition due to medical illness vs dementia -  Continue  Mirtazapine   -  Avoid centrally acting meds   #GERD - Continue on home p.o. pantoprazole  40 mg   #Tobacco use disorder - Nicotine  patch daily   #Lower extremity psoriasis The patient self-administers Tremfya  1 mg every 8 weeks. She also uses betamethasone dipropionate 0.05% topically as needed for her legs.   #Vertigo Continue to hold meclizine  25 mg 3 times daily as needed. Pt did not complain of dizziness today.    #Peripheral neuropathy Continues with no tenderness on BL LE on PE. Will resume gabapentin  on low dose if pt complains of BL LE tenderness  Best Practice: Diet: CLD IVF: None Code: Full   Disposition planning: DISPO: home pending clinical improvement   Signature:  Rebecka Edgardo [facility]  9:57 AM, 03/18/2024  On Call pager [phone]

## 2024-03-18 NOTE — Plan of Care (Signed)
 Patient sat on the edge of the bed throughout my shift. Refused meds except for diflucan  despite of education. Boyfriend at bedside. Needs attended. Call bell within reached. Problem: Education: Goal: Knowledge of General Education information will improve Description: Including pain rating scale, medication(s)/side effects and non-pharmacologic comfort measures Outcome: Progressing   Problem: Health Behavior/Discharge Planning: Goal: Ability to manage health-related needs will improve Outcome: Progressing   Problem: Clinical Measurements: Goal: Ability to maintain clinical measurements within normal limits will improve Outcome: Progressing Goal: Will remain free from infection Outcome: Progressing Goal: Diagnostic test results will improve Outcome: Progressing Goal: Respiratory complications will improve Outcome: Progressing Goal: Cardiovascular complication will be avoided Outcome: Progressing   Problem: Activity: Goal: Risk for activity intolerance will decrease Outcome: Progressing   Problem: Nutrition: Goal: Adequate nutrition will be maintained Outcome: Progressing   Problem: Coping: Goal: Level of anxiety will decrease Outcome: Progressing   Problem: Elimination: Goal: Will not experience complications related to bowel motility Outcome: Progressing Goal: Will not experience complications related to urinary retention Outcome: Progressing   Problem: Pain Managment: Goal: General experience of comfort will improve and/or be controlled Outcome: Progressing   Problem: Safety: Goal: Ability to remain free from injury will improve Outcome: Progressing   Problem: Skin Integrity: Goal: Risk for impaired skin integrity will decrease Outcome: Progressing

## 2024-03-18 NOTE — Plan of Care (Signed)
   Problem: Coping: Goal: Level of anxiety will decrease Outcome: Progressing

## 2024-03-19 DIAGNOSIS — K8689 Other specified diseases of pancreas: Secondary | ICD-10-CM

## 2024-03-19 DIAGNOSIS — D539 Nutritional anemia, unspecified: Secondary | ICD-10-CM

## 2024-03-19 DIAGNOSIS — K2289 Other specified disease of esophagus: Secondary | ICD-10-CM

## 2024-03-19 DIAGNOSIS — N184 Chronic kidney disease, stage 4 (severe): Secondary | ICD-10-CM

## 2024-03-19 DIAGNOSIS — D631 Anemia in chronic kidney disease: Secondary | ICD-10-CM | POA: Diagnosis not present

## 2024-03-19 LAB — CBC
HCT: 21.1 % — ABNORMAL LOW (ref 36.0–46.0)
Hemoglobin: 6.7 g/dL — CL (ref 12.0–15.0)
MCH: 31.9 pg (ref 26.0–34.0)
MCHC: 31.8 g/dL (ref 30.0–36.0)
MCV: 100.5 fL — ABNORMAL HIGH (ref 80.0–100.0)
Platelets: 256 K/uL (ref 150–400)
RBC: 2.1 MIL/uL — ABNORMAL LOW (ref 3.87–5.11)
RDW: 15.5 % (ref 11.5–15.5)
WBC: 8 K/uL (ref 4.0–10.5)
nRBC: 0.5 % — ABNORMAL HIGH (ref 0.0–0.2)

## 2024-03-19 LAB — RENAL FUNCTION PANEL
Albumin: 1.7 g/dL — ABNORMAL LOW (ref 3.5–5.0)
Anion gap: 8 (ref 5–15)
BUN: 26 mg/dL — ABNORMAL HIGH (ref 8–23)
CO2: 21 mmol/L — ABNORMAL LOW (ref 22–32)
Calcium: 8.1 mg/dL — ABNORMAL LOW (ref 8.9–10.3)
Chloride: 115 mmol/L — ABNORMAL HIGH (ref 98–111)
Creatinine, Ser: 2.16 mg/dL — ABNORMAL HIGH (ref 0.44–1.00)
GFR, Estimated: 24 mL/min — ABNORMAL LOW (ref >60)
Glucose, Bld: 73 mg/dL (ref 70–99)
Phosphorus: 3.4 mg/dL (ref 2.5–4.6)
Potassium: 3.2 mmol/L — ABNORMAL LOW (ref 3.5–5.1)
Sodium: 144 mmol/L (ref 135–145)

## 2024-03-19 LAB — MAGNESIUM: Magnesium: 1.8 mg/dL (ref 1.7–2.4)

## 2024-03-19 LAB — O&P RESULT

## 2024-03-19 LAB — OVA + PARASITE EXAM

## 2024-03-19 LAB — ABO/RH: ABO/RH(D): O POS

## 2024-03-19 LAB — PREPARE RBC (CROSSMATCH)

## 2024-03-19 MED ORDER — ORAL CARE MOUTH RINSE: 15.0000 mL | OROMUCOSAL | Status: DC | PRN

## 2024-03-19 MED ORDER — MAGNESIUM SULFATE 2 GM/50ML IV SOLN
2.0000 g | Freq: Once | INTRAVENOUS | Status: AC
  Administered 2024-03-19: 2 g via INTRAVENOUS
  Filled 2024-03-19: qty 50

## 2024-03-19 MED ORDER — POTASSIUM CHLORIDE CRYS ER 20 MEQ PO TBCR
40.0000 meq | EXTENDED_RELEASE_TABLET | Freq: Two times a day (BID) | ORAL | Status: AC
  Filled 2024-03-19 (×2): qty 2

## 2024-03-19 MED ORDER — PANCRELIPASE (LIP-PROT-AMYL) 12000-38000 UNITS PO CPEP
12000.0000 [IU] | ORAL_CAPSULE | Freq: Three times a day (TID) | ORAL | Status: DC
  Administered 2024-03-19: 12000 [IU] via ORAL
  Filled 2024-03-19 (×3): qty 1

## 2024-03-19 MED ORDER — SODIUM CHLORIDE 0.9% IV SOLUTION: Freq: Once | INTRAVENOUS | Status: AC

## 2024-03-19 NOTE — Progress Notes (Signed)
 Pt refused Blood draw for Type and Screen. Educate the PT the purpose of the test, Notified the provider

## 2024-03-19 NOTE — Progress Notes (Signed)
 Patient refused all medications this morning, even after educated on the importance. Patient also educated on the importance on getting type and screen done for the blood transfusion, patient still refusing. MD made aware.

## 2024-03-19 NOTE — Progress Notes (Addendum)
 HD#5 SUBJECTIVE:  Patient Summary: Whitney Parrish is a 68 y.o. with a pertinent PMH of CKD stage III, chronic diarrhea, and GERD, who presented with a syncopal episode and admitted for syncope workup and failure to thrive.   Overnight Events: Patient's hemoglobin 6.7 however patient refused type and screen and blood transfusion.  Interim History: The patient was seen and examined at the bedside this morning she was mildly confused and oriented to self and place.  Unknown what her baseline is at this time.  We did discuss her blood transfusion again to which she refused.  OBJECTIVE:  Vital Signs: Vitals:   03/18/24 1814 03/18/24 2253 03/19/24 0606 03/19/24 1043  BP: 108/64 (!) 100/57 115/69 108/67  Pulse: 75 65 70 69  Resp: 16 18 18 16   Temp: 98.2 F (36.8 C) 98.9 F (37.2 C) 98.9 F (37.2 C) 97.6 F (36.4 C)  TempSrc: Oral Oral Oral Oral  SpO2: 100% 100% 100% 100%  Weight:      Height:       Supplemental O2: Room Air SpO2: 100 % O2 Flow Rate (L/min): 3 L/min  Filed Weights   03/13/24 0800  Weight: 32.7 kg     Intake/Output Summary (Last 24 hours) at 03/19/2024 1149 Last data filed at 03/19/2024 1003 Gross per 24 hour  Intake 720 ml  Output --  Net 720 ml   Net IO Since Admission: 3,075.71 mL [03/19/24 1149]  Physical Exam: Const: Awake, alert in NAD, cachetic HENT: Normocephalic, atraumatic, mucus membranes moist Card: RRR, No MRG, No pitting edema on LE's bilaterally  Resp: LCTAB, no increased work of breathing Abd: Scaphoid abdomen, nontender to palpation Extremities: Warm, dry, cachetic   Patient Lines/Drains/Airways Status     Active Line/Drains/Airways     Name Placement date Placement time Site Days   Peripheral IV 03/14/24 22 G 1 Posterior;Right Forearm 03/14/24  2029  Forearm  5   Wound 03/13/24 1739 Pressure Injury Buttocks Right Stage 2 -  Partial thickness loss of dermis presenting as a shallow open injury with a red, pink wound bed without  slough. 03/13/24  1739  Buttocks  6   Wound 03/14/24 Irritant Contact Dermatitis Buttocks Medial 03/14/24  --  Buttocks  5            Pertinent labs and imaging:     Latest Ref Rng & Units 03/19/2024    3:24 AM 03/18/2024    7:32 AM 03/17/2024    5:07 AM  CBC  WBC 4.0 - 10.5 K/uL 8.0  10.3  12.1   Hemoglobin 12.0 - 15.0 g/dL 6.7  7.2  8.5   Hematocrit 36.0 - 46.0 % 21.1  22.5  26.2   Platelets 150 - 400 K/uL 256  277  346        Latest Ref Rng & Units 03/19/2024    3:24 AM 03/18/2024    5:27 PM 03/18/2024    7:32 AM  CMP  Glucose 70 - 99 mg/dL 73  92  74   BUN 8 - 23 mg/dL 26  28  21    Creatinine 0.44 - 1.00 mg/dL 7.83  7.83  7.88   Sodium 135 - 145 mmol/L 144  144  150   Potassium 3.5 - 5.1 mmol/L 3.2  3.7  3.6   Chloride 98 - 111 mmol/L 115  112  113   CO2 22 - 32 mmol/L 21  20  19    Calcium 8.9 - 10.3 mg/dL 8.1  8.3  8.1     No results found.  ASSESSMENT/PLAN:  Assessment: Principal Problem:   Syncope Active Problems:   Dizziness   Gastroesophageal reflux disease   CKD (chronic kidney disease) stage 3, GFR 30-59 ml/min (HCC)   Protein-calorie malnutrition, severe   Acute renal failure   Chronic diarrhea   Weakness generalized   Pressure injury of skin   AKI (acute kidney injury)   Failure to thrive in adult   Hypoalbuminemia   Plan: #Esophageal candidiasis   -EGD performed on 11/15 with suspected esophageal candidiasis -There was talk about a PEG tube from RD, however we will hold off until further palliative care discussions are completed for goals of care.  As well as treatment for esophageal candidiasis to assess whether improvement in upper GI symptoms allow the patient to have increased oral intake. - Continue to encourage oral intake including ensures. - Continue fluconazole  through 11/29.  #Chronic diarrhea #Pancreatic insufficiency  -The patient was admitted on 11/11 for syncopal episode.  Suspect this is secondary to severe malnutrition  and chronic diarrhea. -A colonoscopy was recommended however the patient was unable to tolerate bowel prep.  Will defer to GI as to whether this needs to be done inpatient or outpatient. -Fecal elastase was obtained on 11/12 which resulted at 37.  This is in the severe pancreatic insufficiency range. - The patient would benefit from Creon starting today.  Will touch base with GI as well. -The patient would also benefit from further investigation into the cause of her pancreatic insufficiency including ruling out malignancy and masses.  However with due to her GFR and creatinine clearance of 12.7, contrast is relatively contraindicated. -Will continue to follow GI for continued work up and management.   #Failure to thrive #Severe malnutrition - BMI 14.06 -Albumin 1.7 -The patient has been evaluated by dietary and is on nutritional supplements for severe malnutrition.  Patient has a longstanding history of poor oral intake however according to family members this has worsened over the past couple of months. -Chronic diarrhea likely contributing, malabsorption contributing 2/2 to pancreatic insufficiency -If patient continues to have poor caloric intake, she may benefit from Dobbhoff or PEG tube for tube feeding.  However we will hold off on this until completion of treatment for esophageal candidiasis. - Palliative care following, appreciate the recommendations. - Continue mirtazapine   #AKI -Patient presented with an AKI, likely secondary to poor oral intake. This has resolved and creatinine back to baseline.  #Anemia -Patient with a history of chronic anemia secondary to CKD. -Baseline 7-8 -Patient with hemoglobin of 6.7 overnight.  She would benefit from a transfusion however the patient is refusing and does not want transfusion at this time.  As the patient is asymptomatic and close to her baseline we will hold off on transfusion at this time.  Will continue to monitor CBCs closely and  encouraged transfusion if hemoglobin continues to trend down or patient becomes symptomatic.  Best Practice: Diet: Regular diet IVF: Fluids: none, Rate: None VTE:  Code: Full  Disposition planning: Therapy Recs: Pending, DME: other pending Family Contact: [name] Fontana, to be notified. DISPO: Anticipated discharge pending to pending pending clinical improvement.  Signature:  Schuyler Novak, DO [facility]  11:49 AM, 03/19/2024  On Call pager [phone]

## 2024-03-19 NOTE — Progress Notes (Signed)
 Patient post 15 min blood transfusion, tolerating well. Vitals within range, afebrile, no complaints of pain and no concern of a blood reaction.

## 2024-03-19 NOTE — Progress Notes (Signed)
 Blood transfusion paused d/t IV starting to leak during blood admin. IV removed.

## 2024-03-19 NOTE — Progress Notes (Signed)
 For second time, pt refused blood draw as per Phlebo Danielle.

## 2024-03-19 NOTE — Plan of Care (Signed)
  Problem: Education: Goal: Knowledge of General Education information will improve Description: Including pain rating scale, medication(s)/side effects and non-pharmacologic comfort measures Outcome: Progressing   Problem: Health Behavior/Discharge Planning: Goal: Ability to manage health-related needs will improve Outcome: Progressing   Problem: Clinical Measurements: Goal: Will remain free from infection Outcome: Progressing Goal: Diagnostic test results will improve Outcome: Progressing   Problem: Nutrition: Goal: Adequate nutrition will be maintained Outcome: Progressing   

## 2024-03-19 NOTE — Plan of Care (Signed)
 Pt receiving blood today, family at bedside.

## 2024-03-19 NOTE — Progress Notes (Addendum)
 Mobility Specialist Progress Note:   03/19/24 1229  Mobility  Activity Stood at bedside (At chair)  Level of Assistance Contact guard assist, steadying assist (+2)  Assistive Device Front wheel walker  Activity Response Tolerated well  Mobility Referral Yes  Mobility visit 1 Mobility  Mobility Specialist Start Time (ACUTE ONLY) 1214  Mobility Specialist Stop Time (ACUTE ONLY) 1229  Mobility Specialist Time Calculation (min) (ACUTE ONLY) 15 min   Received pt in chair and refused ambulation in hallway, reason unspecified; but agreeable to a clean up. Pt required MinG and +2 for safety. Pt had BM. Assisted pt w/ linen,gown change and brief change. No c/o. Pt returned to chair with alarm on. Personal belongings and call light within reach. All needs met.  Lavanda Pollack Mobility Specialist  Please contact via [employer] or  Rehab Office [phone]

## 2024-03-19 NOTE — Progress Notes (Signed)
 Spoke to PT and she agreed to do blood draw for Type and Screen. Informed Phlebo Shana to come up and draw blood for type and screen

## 2024-03-19 NOTE — Progress Notes (Signed)
 Blood transfusion completed, patient tolerated well. No S/S of reaction. Bed in lowest position and call light within reach.

## 2024-03-19 NOTE — Progress Notes (Signed)
 Patient attempted to take her evening meds. The only medication that she would swallow was the imodium. This nurse attempted several times and offered to crush them and put them in pudding or applesauce, but patient refused. Kept putting them in her mouth and then taking them back out. Meds were cut in half but she still stated that she was unable to swallow them. States I can't do it.

## 2024-03-19 NOTE — Progress Notes (Signed)
 Occupational Therapy Treatment Patient Details Name: Whitney Parrish MRN: [id number] DOB: 26-Apr-1956 Today's Date: 03/19/2024   History of present illness Pt is a 68 y.o. female presenting 03/13/24 with syncope and generalized weakness for the past 2 weeks. Workup for failure to thrive, worsening chronic diarrhea, NAGMA, and acute metabolic encephalopathy. PMH: CKD4, hypokalemia, asthma, GERD, psoriasis, depression, and insomnia.  Of note, recent hospitalization 10/18-10/25 for syncopal episode found to have severe hypokalemia secondary to chronic diarrhea   OT comments  Pt continues to have cognitive difficulty making adls unsafe for pt to do without direct supervision and min assist.  Pt is not oriented to date, time or situation and tends to refuse most medical recommendations but feel she does not understand the reason for recommendations. Boyfriend was in room at the time of treatment. Pt will need strict 24/7 assist if she is to go home.       If plan is discharge home, recommend the following:  A little help with walking and/or transfers;A lot of help with bathing/dressing/bathroom;Assistance with cooking/housework;Direct supervision/assist for medications management;Direct supervision/assist for financial management;Assist for transportation;Help with stairs or ramp for entrance   Equipment Recommendations  BSC/3in1    Recommendations for Other Services      Precautions / Restrictions Precautions Precautions: Fall Recall of Precautions/Restrictions: Impaired Restrictions Weight Bearing Restrictions Per Provider Order: No       Mobility Bed Mobility Overal bed mobility: Needs Assistance Bed Mobility: Supine to Sit     Supine to sit: Contact guard, HOB elevated     General bed mobility comments: assist to get into full sitting position and cues to move foward on bed to get feet on fllor    Transfers Overall transfer level: Needs assistance Equipment used: Rolling  walker (2 wheels) Transfers: Sit to/from Stand, Bed to chair/wheelchair/BSC Sit to Stand: Min assist, Contact guard assist     Step pivot transfers: Contact guard assist     General transfer comment: Cues for hand placement and to steady once standing.     Balance Overall balance assessment: Needs assistance Sitting-balance support: No upper extremity supported, Feet supported Sitting balance-Leahy Scale: Good Sitting balance - Comments: improved   Standing balance support: Bilateral upper extremity supported, During functional activity Standing balance-Leahy Scale: Poor Standing balance comment: pt dependent on RW                           ADL either performed or assessed with clinical judgement   ADL Overall ADL's : Needs assistance/impaired Eating/Feeding: Set up;Sitting   Grooming: Wash/dry hands;Wash/dry face;Contact guard assist;Standing Grooming Details (indicate cue type and reason): pt stood at sink. Fatigues quickly.     Lower Body Bathing: Minimal assistance;Sit to/from stand Lower Body Bathing Details (indicate cue type and reason): pt washed lower body after urinating in depends. Pt was cognizant of these things pta but now does not know whe she is going to bathroom.     Lower Body Dressing: Minimal assistance;Sit to/from stand Lower Body Dressing Details (indicate cue type and reason): pt requires step by step instructions to dress LE but is able to do so with min assist. Toilet Transfer: Minimal assistance;Ambulation;Rolling walker (2 wheels);Comfort height toilet;Grab bars Toilet Transfer Details (indicate cue type and reason): Pt walked tob bathroom with one posterior LOB requiring min assist to recover. Toileting- Architect and Hygiene: Sit to/from stand;Moderate assistance Toileting - Clothing Manipulation Details (indicate cue type and reason): assist to manage  depends. Pt incontinent.     Functional mobility during ADLs: Minimal  assistance;Rolling walker (2 wheels) General ADL Comments: Pt limited with adls due to decreased insight and understanding of deficits. MDs in to speak to pt and pt refuses many medical recommendations but do not feel pt understands the necessity or reason for any of the procedures.    Extremity/Trunk Assessment Upper Extremity Assessment Upper Extremity Assessment: Overall WFL for tasks assessed   Lower Extremity Assessment Lower Extremity Assessment: Defer to PT evaluation        Vision       Perception     Praxis     Communication Communication Communication: No apparent difficulties   Cognition Arousal: Alert Behavior During Therapy: WFL for tasks assessed/performed Cognition: Cognition impaired   Orientation impairments: Time, Situation Awareness: Intellectual awareness impaired, Online awareness impaired Memory impairment (select all impairments): Short-term memory, Working civil service fast streamer, Conservation officer, historic buildings Attention impairment (select first level of impairment): Sustained attention Executive functioning impairment (select all impairments): Organization, Sequencing, Reasoning, Problem solving OT - Cognition Comments: follows one step commands, poor historian.                 Following commands: Impaired Following commands impaired: Follows one step commands with increased time      Cueing   Cueing Techniques: Verbal cues, Gestural cues, Tactile cues  Exercises      Shoulder Instructions       General Comments Pt most limited by decreased cognition and understanding of the medical situation at hand. Pt not eating much and Hgb is low so may be feeling tired. MD stated ok to move forward with therapy despite hgb.    Pertinent Vitals/ Pain       Pain Assessment Pain Assessment: Faces Faces Pain Scale: Hurts little more Pain Location: Generalized Pain Descriptors / Indicators: Aching Pain Intervention(s): Monitored during session, Repositioned  Home  Living                                          Prior Functioning/Environment              Frequency  Min 2X/week        Progress Toward Goals  OT Goals(current goals can now be found in the care plan section)  Progress towards OT goals: Progressing toward goals  Acute Rehab OT Goals Patient Stated Goal: none stated OT Goal Formulation: With patient Time For Goal Achievement: 03/28/24 Potential to Achieve Goals: Good ADL Goals Pt Will Perform Grooming: with supervision;with contact guard assist;standing Pt Will Perform Lower Body Dressing: with contact guard assist;sit to/from stand Pt Will Transfer to Toilet: ambulating;with contact guard assist Additional ADL Goal #1: pt will be mod I for OOB ADL Additional ADL Goal #2: pt will perform pillbox test mod I Additional ADL Goal #3: pt will identify and implement strategies to reduce dizziness while performing mobility (i.e. gaze stabilization) with min cues.  Plan      Co-evaluation                 AM-PAC OT 6 Clicks Daily Activity     Outcome Measure   Help from another person eating meals?: None Help from another person taking care of personal grooming?: A Little Help from another person toileting, which includes using toliet, bedpan, or urinal?: A Lot Help from another person bathing (including washing, rinsing, drying)?: A  Little Help from another person to put on and taking off regular upper body clothing?: A Little Help from another person to put on and taking off regular lower body clothing?: A Little 6 Click Score: 18    End of Session Equipment Utilized During Treatment: Rolling walker (2 wheels)  OT Visit Diagnosis: Unsteadiness on feet (R26.81);History of falling (Z91.81);Muscle weakness (generalized) (M62.81);Other symptoms and signs involving cognitive function   Activity Tolerance Patient limited by fatigue   Patient Left in chair;with call bell/phone within reach;with  chair alarm set;with family/visitor present   Nurse Communication Mobility status        Time: 9042-8961 OT Time Calculation (min): 41 min  Charges: OT General Charges $OT Visit: 1 Visit OT Treatments $Self Care/Home Management : 38-52 mins    Joshua Silvano Dragon 03/19/2024, 10:54 AM

## 2024-03-19 NOTE — Anesthesia Postprocedure Evaluation (Signed)
 Anesthesia Post Note  Patient: Whitney Parrish  Procedure(s) Performed: EGD (ESOPHAGOGASTRODUODENOSCOPY)     Patient location during evaluation: PACU Anesthesia Type: MAC Level of consciousness: awake and alert Pain management: pain level controlled Vital Signs Assessment: post-procedure vital signs reviewed and stable Respiratory status: spontaneous breathing, nonlabored ventilation, respiratory function stable and patient connected to nasal cannula oxygen Cardiovascular status: stable and blood pressure returned to baseline Postop Assessment: no apparent nausea or vomiting Anesthetic complications: no   No notable events documented.  Last Vitals:  Vitals:   03/18/24 2253 03/19/24 0606  BP: (!) 100/57 115/69  Pulse: 65 70  Resp: 18 18  Temp: 37.2 C 37.2 C  SpO2: 100% 100%    Last Pain:  Vitals:   03/19/24 0606  TempSrc: Oral  PainSc:                  [name]

## 2024-03-20 ENCOUNTER — Inpatient Hospital Stay (HOSPITAL_COMMUNITY)
Admission: RE | Admit: 2024-03-20 | Discharge: 2024-03-20 | Disposition: A | Discharge status: Home / Self Care | Source: Ambulatory Visit | Attending: Internal Medicine | Admitting: Internal Medicine

## 2024-03-20 DIAGNOSIS — K529 Noninfective gastroenteritis and colitis, unspecified: Secondary | ICD-10-CM | POA: Diagnosis not present

## 2024-03-20 DIAGNOSIS — K8689 Other specified diseases of pancreas: Secondary | ICD-10-CM | POA: Diagnosis not present

## 2024-03-20 DIAGNOSIS — R627 Adult failure to thrive: Secondary | ICD-10-CM | POA: Diagnosis not present

## 2024-03-20 DIAGNOSIS — Z7189 Other specified counseling: Secondary | ICD-10-CM | POA: Diagnosis not present

## 2024-03-20 DIAGNOSIS — D631 Anemia in chronic kidney disease: Secondary | ICD-10-CM

## 2024-03-20 DIAGNOSIS — Z515 Encounter for palliative care: Secondary | ICD-10-CM | POA: Diagnosis not present

## 2024-03-20 DIAGNOSIS — K229 Disease of esophagus, unspecified: Secondary | ICD-10-CM

## 2024-03-20 DIAGNOSIS — N184 Chronic kidney disease, stage 4 (severe): Secondary | ICD-10-CM

## 2024-03-20 DIAGNOSIS — K909 Intestinal malabsorption, unspecified: Secondary | ICD-10-CM

## 2024-03-20 DIAGNOSIS — B3781 Candidal esophagitis: Secondary | ICD-10-CM

## 2024-03-20 LAB — CBC
HCT: 28 % — ABNORMAL LOW (ref 36.0–46.0)
Hemoglobin: 9.4 g/dL — ABNORMAL LOW (ref 12.0–15.0)
MCH: 31.2 pg (ref 26.0–34.0)
MCHC: 33.6 g/dL (ref 30.0–36.0)
MCV: 93 fL (ref 80.0–100.0)
Platelets: 226 K/uL (ref 150–400)
RBC: 3.01 MIL/uL — ABNORMAL LOW (ref 3.87–5.11)
RDW: 20 % — ABNORMAL HIGH (ref 11.5–15.5)
WBC: 8.8 K/uL (ref 4.0–10.5)
nRBC: 0 % (ref 0.0–0.2)

## 2024-03-20 LAB — URINALYSIS, ROUTINE W REFLEX MICROSCOPIC
Bilirubin Urine: NEGATIVE
Glucose, UA: NEGATIVE mg/dL
Ketones, ur: 5 mg/dL — AB
Nitrite: NEGATIVE
Protein, ur: NEGATIVE mg/dL
Specific Gravity, Urine: 1.01 (ref 1.005–1.030)
WBC, UA: >50 WBC/hpf (ref 0–5)
pH: 6 (ref 5.0–8.0)

## 2024-03-20 LAB — BASIC METABOLIC PANEL WITH GFR
Anion gap: 8 (ref 5–15)
BUN: 26 mg/dL — ABNORMAL HIGH (ref 8–23)
CO2: 23 mmol/L (ref 22–32)
Calcium: 8.2 mg/dL — ABNORMAL LOW (ref 8.9–10.3)
Chloride: 112 mmol/L — ABNORMAL HIGH (ref 98–111)
Creatinine, Ser: 1.97 mg/dL — ABNORMAL HIGH (ref 0.44–1.00)
GFR, Estimated: 27 mL/min — ABNORMAL LOW (ref >60)
Glucose, Bld: 70 mg/dL (ref 70–99)
Potassium: 3.7 mmol/L (ref 3.5–5.1)
Sodium: 143 mmol/L (ref 135–145)

## 2024-03-20 LAB — TYPE AND SCREEN
ABO/RH(D): O POS
Antibody Screen: NEGATIVE
Unit division: 0

## 2024-03-20 LAB — BPAM RBC
Blood Product Expiration Date: 202512152359
ISSUE DATE / TIME: 202511171438
Unit Type and Rh: 5100

## 2024-03-20 LAB — PHOSPHORUS: Phosphorus: 2.8 mg/dL (ref 2.5–4.6)

## 2024-03-20 MED ORDER — ASCORBIC ACID 500 MG/5ML PO LIQD
500.0000 mg | Freq: Every day | ORAL | Status: DC
  Filled 2024-03-20 (×2): qty 5

## 2024-03-20 MED ORDER — POTASSIUM CHLORIDE 2 MEQ/ML IV SOLN
INTRAVENOUS | Status: DC
  Filled 2024-03-20 (×3): qty 1000

## 2024-03-20 MED ORDER — MAGNESIUM SULFATE 2 GM/50ML IV SOLN
2.0000 g | Freq: Once | INTRAVENOUS | Status: AC
  Administered 2024-03-20: 2 g via INTRAVENOUS
  Filled 2024-03-20: qty 50

## 2024-03-20 MED ORDER — ACETAMINOPHEN 325 MG PO TABS: 650.0000 mg | ORAL_TABLET | ORAL | Status: DC | PRN

## 2024-03-20 MED ORDER — THIAMINE HCL 100 MG/ML IJ SOLN
100.0000 mg | Freq: Every day | INTRAMUSCULAR | Status: DC
  Administered 2024-03-20 – 2024-03-25 (×6): 100 mg via INTRAVENOUS
  Filled 2024-03-20 (×6): qty 2

## 2024-03-20 MED ORDER — LOPERAMIDE HCL 1 MG/7.5ML PO SUSP
4.0000 mg | Freq: Three times a day (TID) | ORAL | Status: DC
  Administered 2024-03-20 – 2024-03-26 (×14): 4 mg via ORAL
  Filled 2024-03-20 (×22): qty 30

## 2024-03-20 MED ORDER — CYANOCOBALAMIN 1000 MCG/ML IJ SOLN
1000.0000 ug | Freq: Once | INTRAMUSCULAR | Status: AC
  Administered 2024-03-20: 1000 ug via SUBCUTANEOUS
  Filled 2024-03-20: qty 1

## 2024-03-20 MED ORDER — FOLIC ACID 5 MG/ML IJ SOLN
1.0000 mg | Freq: Every day | INTRAMUSCULAR | Status: DC
  Administered 2024-03-20 – 2024-03-25 (×5): 1 mg via INTRAVENOUS
  Filled 2024-03-20 (×8): qty 0.2

## 2024-03-20 MED ORDER — FLUCONAZOLE IN SODIUM CHLORIDE 200-0.9 MG/100ML-% IV SOLN
200.0000 mg | INTRAVENOUS | Status: DC
  Administered 2024-03-20 – 2024-03-26 (×6): 200 mg via INTRAVENOUS
  Filled 2024-03-20 (×8): qty 100

## 2024-03-20 MED ORDER — VITAMIN D 25 MCG (1000 UNIT) PO TABS
1000.0000 [IU] | ORAL_TABLET | Freq: Every day | ORAL | Status: DC
  Administered 2024-03-22 – 2024-03-26 (×5): 1000 [IU] via ORAL
  Filled 2024-03-20 (×6): qty 1

## 2024-03-20 MED ORDER — ACETAMINOPHEN 650 MG RE SUPP: 650.0000 mg | RECTAL | Status: DC | PRN

## 2024-03-20 MED ORDER — VITAMIN C 500 MG PO TABS
500.0000 mg | ORAL_TABLET | Freq: Every day | ORAL | Status: DC
  Administered 2024-03-21 – 2024-03-26 (×6): 500 mg via ORAL
  Filled 2024-03-20 (×6): qty 1

## 2024-03-20 MED ORDER — CHOLECALCIFEROL 10 MCG/ML (400 UNIT/ML) PO LIQD
1000.0000 [IU] | Freq: Every day | ORAL | Status: DC
  Filled 2024-03-20 (×2): qty 2.5

## 2024-03-20 NOTE — Progress Notes (Signed)
 PT Cancellation Note  Patient Details Name: Whitney Parrish MRN: [id number] DOB: 16-Feb-1956   Cancelled Treatment:    Reason Eval/Treat Not Completed: (P) Patient declined, no reason specified, pt adamantly refusing all mobility with noted disorientation to time of day and location. Offered education on PT purpose with reassurance on returning to bed at end of session with pt continuing to refuse and asking this PTA to leave. Will check back as schedule allows to continue with PT POC.  Therisa SAUNDERS. PTA Acute Rehabilitation Services Office: [phone]   Therisa CHRISTELLA Boor 03/20/2024, 3:22 PM

## 2024-03-20 NOTE — Plan of Care (Signed)

## 2024-03-20 NOTE — Progress Notes (Signed)
 HD#6 SUBJECTIVE:  Patient Summary: Whitney Parrish is a 68 y.o. with a pertinent PMH of CKD stage III, chronic diarrhea, and GERD, who presented with a syncopal episode and admitted for syncope workup and failure to thrive.   Overnight Events: NAE  Interim History: The pt was seen and examined at the bedside this morning. She was resting comfortably and conversational with the team. She was oriented to A&O to self and place.   OBJECTIVE:  Vital Signs: Vitals:   03/19/24 1845 03/20/24 0507 03/20/24 0749 03/20/24 0956  BP: 123/84 116/80  128/80  Pulse: 70 60 60 (!) 57  Resp: 18 17 17 17   Temp: 98.6 F (37 C) 98.4 F (36.9 C)  98.2 F (36.8 C)  TempSrc: Oral Oral  Oral  SpO2:  100% 100% 100%  Weight:      Height:       Supplemental O2: Room Air SpO2: 100 % O2 Flow Rate (L/min): 3 L/min  Filed Weights   03/13/24 0800  Weight: 32.7 kg     Intake/Output Summary (Last 24 hours) at 03/20/2024 1543 Last data filed at 03/20/2024 0900 Gross per 24 hour  Intake 702 ml  Output --  Net 702 ml   Net IO Since Admission: 3,777.71 mL [03/20/24 1543]  Physical Exam: Const: Frail appearing woman in no acute distress.  HENT: Normocephalic, atraumatic, mucus membranes moist Card: RRR, No MRG, No pitting edema on LE's bilaterally  Resp: LCTAB, no increased work of breathing Abd: Scaphoid abdomen, nontender to palpation Extremities: Warm, dry, cachetic   Patient Lines/Drains/Airways Status     Active Line/Drains/Airways     Name Placement date Placement time Site Days   Peripheral IV 03/14/24 22 G 1 Posterior;Right Forearm 03/14/24  2029  Forearm  5   Wound 03/13/24 1739 Pressure Injury Buttocks Right Stage 2 -  Partial thickness loss of dermis presenting as a shallow open injury with a red, pink wound bed without slough. 03/13/24  1739  Buttocks  6   Wound 03/14/24 Irritant Contact Dermatitis Buttocks Medial 03/14/24  --  Buttocks  5            Pertinent labs and  imaging:     Latest Ref Rng & Units 03/20/2024    5:29 AM 03/19/2024    3:24 AM 03/18/2024    7:32 AM  CBC  WBC 4.0 - 10.5 K/uL 8.8  8.0  10.3   Hemoglobin 12.0 - 15.0 g/dL 9.4  6.7  7.2   Hematocrit 36.0 - 46.0 % 28.0  21.1  22.5   Platelets 150 - 400 K/uL 226  256  277        Latest Ref Rng & Units 03/20/2024    5:29 AM 03/19/2024    3:24 AM 03/18/2024    5:27 PM  CMP  Glucose 70 - 99 mg/dL 70  73  92   BUN 8 - 23 mg/dL 26  26  28    Creatinine 0.44 - 1.00 mg/dL 8.02  7.83  7.83   Sodium 135 - 145 mmol/L 143  144  144   Potassium 3.5 - 5.1 mmol/L 3.7  3.2  3.7   Chloride 98 - 111 mmol/L 112  115  112   CO2 22 - 32 mmol/L 23  21  20    Calcium 8.9 - 10.3 mg/dL 8.2  8.1  8.3     No results found.  ASSESSMENT/PLAN:  Assessment: Principal Problem:   Syncope Active Problems:  Dizziness   Gastroesophageal reflux disease   CKD (chronic kidney disease) stage 3, GFR 30-59 ml/min (HCC)   Protein-calorie malnutrition, severe   Acute renal failure   Chronic diarrhea   Weakness generalized   Pressure injury of skin   AKI (acute kidney injury)   Failure to thrive in adult   Hypoalbuminemia   Pancreatic insufficiency   Stage 4 chronic kidney disease (HCC)   Macrocytic anemia   Candidiasis, esophageal (HCC)   Chronic kidney disease (CKD), stage IV (severe) (HCC)   Diarrhea due to malabsorption   Goals of care, counseling/discussion   Plan: #Esophageal candidiasis   -EGD performed on 11/15 with suspected esophageal candidiasis -There was talk about a PEG tube from RD, however we will hold off until further palliative care discussions are completed for goals of care.  As well as treatment for esophageal candidiasis to assess whether improvement in upper GI symptoms allow the patient to have increased oral intake. - Continue to encourage oral intake including ensures. - Fluconazole  was transitioned to IV due to patient spitting out medications--continue for 14 days through  12/2  #Chronic diarrhea #Pancreatic insufficiency  -The patient was admitted on 11/11 for syncopal episode.  Suspect this is secondary to severe malnutrition and chronic diarrhea. -A colonoscopy was recommended however the patient was unable to tolerate bowel prep.  Will defer to GI as to whether this needs to be done inpatient or outpatient. -Fecal elastase 37, however this can be false in the setting of dilution from watery diarrhea. Her stool have solidified more over the last few days, will re-order and if continues to be low, will treat with creon.  -Will continue to follow GI for continued work up and management.   #Failure to thrive #Severe malnutrition - BMI 14.06 -Albumin 1.7 -The patient has been evaluated by dietary and is on nutritional supplements for severe malnutrition.  Patient has a longstanding history of poor oral intake however according to family members this has worsened over the past couple of months. -If patient continues to have poor caloric intake, she may benefit from Dobbhoff or PEG tube for tube feeding.  However we will hold off on this until completion of treatment for esophageal candidiasis. - Palliative care following, appreciate the recommendations. - Continue mirtazapine  - GOC discussed with patient's niece, Whitney, along with palliative. Discussed feeding tube as well as aggressive work up vs palliative and hospice moving forward. [name] verbalized understanding of her aunt's overall state of health and will begin having conversations and making care decisions throughout the week. We will continue to hold off on aggressive measures such as a peg tube until these informed decisions can be made.   #AKI -Patient presented with an AKI, likely secondary to poor oral intake. This has resolved and creatinine back to baseline.  #Anemia -Patient with a history of chronic anemia secondary to CKD. -Baseline 7-8 -s/p 1unit PRBC 11/17 with improvement in Hgb.  Continue to trend CBC's  Best Practice: Diet: Regular diet IVF: Fluids: none, Rate: None VTE:  Code: Full  Disposition planning: Therapy Recs: Pending, DME: other pending Family Contact: Whitney Parrish, to be notified. DISPO: Anticipated discharge pending to pending pending clinical improvement.  Signature:  Schuyler Novak, DO [facility]  3:43 PM, 03/20/2024  On Call pager [phone]

## 2024-03-20 NOTE — Plan of Care (Signed)
  Problem: Education: Goal: Knowledge of General Education information will improve Description: Including pain rating scale, medication(s)/side effects and non-pharmacologic comfort measures Outcome: Not Progressing   Problem: Health Behavior/Discharge Planning: Goal: Ability to manage health-related needs will improve Outcome: Not Progressing   Problem: Nutrition: Goal: Adequate nutrition will be maintained Outcome: Not Progressing   

## 2024-03-20 NOTE — Progress Notes (Signed)
 Daily Progress Note   Date: 03/20/2024   Patient Name: Whitney Parrish  DOB: 1955/07/17  MRN: [id number]  Age / Sex: 68 y.o., female  Attending Physician: Lovie Clarity, MD Primary Care Physician: Phyllis Jereld BROCKS, NP Admit Date: 03/13/2024 Length of Stay: 6 days  Reason for Follow-up: Establishing goals of care  Past Medical History:  Diagnosis Date   Anxiety    Arthritis    shoulders and hands   Asthma    Benign positional vertigo    Chronically dry eyes    CKD (chronic kidney disease), stage III (HCC)    Depression    Dyspnea on exertion    GERD (gastroesophageal reflux disease)    Hematuria    History of acute pyelonephritis    History of Bell's palsy    1980's left side-- residual lip numbness intermittant   History of chest pain    non-cardiac per dr claudene notes (pt's pcp)   History of panic attacks    Hypertension    Left ureteral stone    Loose bowel movements    chronic-per pt residual from injury during abdominal surgery in 1986   Migraines    Nephrolithiasis    left    Psoriasis    Urgency of urination    Wears dentures    has upper and lower but only wears upper    Assessment & Plan:   HPI/Patient Profile:  68 y.o. female  with past medical history of CKD IV, hypokalemia, psoriasis, depression admitted on 03/13/2024 with AKI on CKD, severe protein calorie malnutrition, and acute metabolic encephalopathy. Patient most recently admitted 10/18-10/25 for a similar presentation with syncope and generalized weakness.   Palliative medicine consulted for goals of care conversation.   SUMMARY OF RECOMMENDATIONS Continue full code, full scope, had a frank discussion with niece [name]) about futility of continued interventions and risks vs benefits Will continue GOC discussion on 11/20 with [name] in person for possible pathways including Rehab, Rehab and palliative outpatient following for deterioration, or full comfort measures  Symptom  Management:  Per primary team  Code Status: Full Code  Prognosis: < 6 months  Discharge Planning: To Be Determined  Discussed with: [name] DO participated in meeting by phone with patient's niece [name]) and aware of current plan to meet on 11/20 and further goals of care delineation.   Subjective:   Subjective: Chart Reviewed. Updates received. Patient Assessed. Created space and opportunity for patient  and family to explore thoughts and feelings regarding current medical situation.  Today's Discussion: Today before meeting with the patient/family, I reviewed the chart notes including GI note by Dr. Rollin on 11/15, progress note from 11/15 by Dr. Edgardo, and progress note from 11/17 by Dr. [name].  GI Procedure Note by Dr. Rollin on 11/15 stated that patient underwent EGD with findings suspicious for candidiasis, a 2 cm hiatal hernia, biopsy of erosive gastropathy w/o signs of bleeding, and 3 duodenal polyps were resected.   Progress note by Dr. Edgardo on 11/15 stated patient unable to recall having EGD completed.   Met with patient at bedside today and she was unable to recall any of the events that happened over the last 3 days. Does not recall having completed an EGD. Remains pleasantly confused but unable to share specific events that have occurred so far in her hospitalization.   Spoke on phone with [name] with Dr. [name] participating to discuss current hospitalization and possible paths going forward. Dr. [name] shared that currently GI  most likely will not pursue anymore interventions and that given her severely malnourished body, continued aggressive interventions is futile and would cause more harm than help. Discussed continued poor PO intake and that feeding tube would most likely not help in the patient's recovery and that it would also be a source of complication including infections, ulcers, and aspiration. Shared with [name] that based on the patient's BMI and poor PO intake  patient would meet hospice criteria to express the seriousness of situation. Discussed with [name] that there are 3 realistic pathways to proceed with going forward and that is to continue with current management with plans to discharge to a rehab facility with palliative following as outpatient to monitor for deterioration with plans to transition to hospice or to pursue comfort measures in hospital knowing that the time the patient has left is short. [name] plans to visit on 11/20 for further goals of care and would like some more time to consider her options.   Review of Systems  Constitutional:  Positive for appetite change.  Gastrointestinal:  Positive for diarrhea. Negative for abdominal pain.  Endocrine: Positive for cold intolerance.    Objective:   Primary Diagnoses: Present on Admission:  Syncope  Gastroesophageal reflux disease  CKD (chronic kidney disease) stage 3, GFR 30-59 ml/min (HCC)  Protein-calorie malnutrition, severe  Acute renal failure  Chronic diarrhea   Vital Signs:  BP 128/80 (BP Location: Right Arm)   Pulse (!) 57   Temp 98.2 F (36.8 C) (Oral)   Resp 17   Ht 5' (1.524 m)   Wt 32.7 kg   SpO2 100%   BMI 14.06 kg/m   Physical Exam Constitutional:      Appearance: She is ill-appearing.     Comments: Cachectic  HENT:     Head: Normocephalic.     Nose: Nose normal.  Eyes:     Extraocular Movements: Extraocular movements intact.  Cardiovascular:     Rate and Rhythm: Normal rate.  Pulmonary:     Effort: Pulmonary effort is normal.  Abdominal:     General: Abdomen is flat. There is no distension.     Tenderness: There is no abdominal tenderness.  Skin:    General: Skin is dry.  Neurological:     Mental Status: She is disoriented.     Comments: Only alert to self and situation.  Psychiatric:     Comments: Limited insight on health.    Palliative Assessment/Data: 20%   Existing Vynca/ACP Documentation: None  Thank you for allowing us   to participate in the care of Whitney Parrish PMT will continue to support holistically.  I personally spent a total of 35 minutes in the care of the patient today including preparing to see the patient, performing a medically appropriate exam/evaluation, counseling and educating, referring and communicating with other health care professionals, and coordinating care.   [name]  Palliative Medicine Team  Team Phone # [phone] (Nights/Weekends) 03/20/2024 11:05 AM

## 2024-03-20 NOTE — Progress Notes (Signed)
 Washed and changed patient. She is alert, confused to place and time. Sitting up in bed drinking her coffee.

## 2024-03-20 NOTE — Progress Notes (Signed)
   Progress Note   Date: 03/19/2024  Patient Name: Whitney Parrish        MRN#: [id number]   Clarification of the diagnosis of pressure ulcer(s):   Stage 2 Pressure Injury R lateral buttock present on admission (at time of the admission order)

## 2024-03-20 NOTE — Progress Notes (Signed)
 Nutrition Follow-up  DOCUMENTATION CODES:   Severe malnutrition in context of chronic illness, Underweight  INTERVENTION:  Encourage PO intake - On Dysphagia 3, thin liquids  Per pt's preference transition to dysphagia 3 diet to help with ease of intake Room service with assist Nursing to assist with meals Ensure Plus High Protein po BID, each supplement provides 350 kcal and 20 grams of protein Mighty Shake TID with meals, each supplement provides 330 kcals and 9 grams of protein 1 packet Juven BID, each packet provides 95 calories, 2.5 grams of protein (collagen), to support wound healing MVI with minerals daily 100 mg Thiamine daily Continue Vitamin A  and Vitamin D for noted deficiency  500 mg Vitamin C x 30 days and 220 mg Zinc x 15 days for wound healing  Monitor magnesium , potassium, and phosphorus daily, MD to replete as needed, as pt is at risk for refeeding syndrome given severe malnutrition   Pt may need PEG tube if within GOC if intake remains poor.  If PEG tube within GOC recommend cortrak tube be placed to bridge nutritional gap before PEG tube is palced Tube feeding recommendation if warranted: Jevity 1.5 at 10 ml/h and increase by 10 ml every 10 hours until goal of  45 ml/hr (1080 ml per day) Prosource TF20 60 ml daily Provides 1700 kcal, 88 gm protein, 820 ml free water daily  NUTRITION DIAGNOSIS:   Severe Malnutrition related to chronic illness as evidenced by severe muscle depletion, severe fat depletion, energy intake < or equal to 75% for > or equal to 1 month, percent weight loss (In the last year pt has lost 23 lbs, 24%.). - Ongoing   GOAL:   Patient will meet greater than or equal to 90% of their needs, Weight gain - Progressing   MONITOR:   PO intake, Supplement acceptance, Labs, Weight trends, I & O's, Skin  REASON FOR ASSESSMENT:   Consult Assessment of nutrition requirement/status  ASSESSMENT:   68 y.o Female with PMH CKD4, hypokalemia, HTN,  GERD, expl lap with abdominal myomectomy 1986 for injury, depression. Presented with syncope, ongoing loose BM, weakness, FTT, possible dementia vs metabolic encephalopathy.  11/11 - CT imaging of the head and abdomen showed no acute intracranial, abdominal, or infectious process.  11/15 - EGD suspected esophageal candidiasis    Pt resting in bed, appears frail and cachetic. Still with very poor po. Did not have breakfast or lunch today. Per RN had maybe 50% of an Ensure so far today. Multiple supplements at bedside, RD tried to provide Ensure or Boost Breeze to pt, pt refused. Pt did accept Juven provided by RN. Pt confused when answering questions, pt reports she did not eat anything today and then would say she is eating. States she is drinking 4 Ensures per day but RN confirmed this is not the case. Also noted pt with confused when taking medications and refuses them. Pt has no desire to eat.   Pt has history of multiple loose BM per day, which pt states is a factor in not wanting to eat. On imodium and psyllium which seems to be helping however no output charted. RN reports she had x1 BM yesterday. Pt severely malnourished.  Pancreatic elastase of 37. Per MD, plan to treat pancreatic insufficiency with creon and nutritional deficiens with supplementation, and treat esophageal candidiasis before committing to feeding tube. Waiting on Palliative consult in regards to GOC for feeding tube.   Per MD/GI: Unknown etiology of loose stools, Pancreatic elastase  of 37, severe pancreatic insufficiency, etiology unclear. Started on creon 11/17.  Possible malignancy/mass however cannot determine as pt not able to have contrast at this moment.    Admit weight: 32.7 kg  Current weight: 32.7 kg   Average Meal Intake: 11/12: 50% intake x 2 recorded meals 11/13: 58% intake x 3 recorded meals 11/18: 0% intake x 2 recorded meals   Nutritionally Relevant Medications: Scheduled Meds:  acidophilus  2 capsule Oral  TID   ascorbic acid  500 mg Oral Daily   cholecalciferol  1,000 Units Oral Daily   feeding supplement  237 mL Oral BID BM   folic acid  1 mg Intravenous Daily   loperamide HCl  4 mg Oral TID   mirtazapine   7.5 mg Oral Daily   multivitamin with minerals  1 tablet Oral Daily   nutrition supplement (JUVEN)  1 packet Oral BID BM   psyllium  1 packet Oral Daily   thiamine (VITAMIN B1) injection  100 mg Intravenous Daily   vitamin A  10,000 Units Oral Daily   zinc sulfate (50mg  elemental zinc)  220 mg Oral Daily   Continuous Infusions:  fluconazole  (DIFLUCAN ) IV 200 mg (03/20/24 1336)   Labs Reviewed: Pancreatic elastase of 37 BUN 26 Creatinine 1.97 GFR 27 Creatinine 2.58 GFR 20 CBG ranges from 70-73 mg/dL over the last 24 hours October: Vitamin D 22.55 (low) Vitamin A 7.4 (low) Vitamin E 14.9 (normal) Folate 3.8 (low)  Diet Order:   Diet Order             DIET DYS 3 Room service appropriate? Yes with Assist; Fluid consistency: Thin  Diet effective now                   EDUCATION NEEDS:   Education needs have been addressed  Skin:  Skin Assessment: Skin Integrity Issues: Skin Integrity Issues:: Stage II Stage II: Right buttocks  Last BM:  11/12, x 3, type 7  Height:   Ht Readings from Last 1 Encounters:  03/13/24 5' (1.524 m)    Weight:   Wt Readings from Last 1 Encounters:  03/13/24 32.7 kg    Ideal Body Weight:  45.5 kg  BMI:  Body mass index is 14.06 kg/m.  Estimated Nutritional Needs:   Kcal:  1500-1700 kcal  Protein:  70-90 gm  Fluid:  >1.6L/day   Whitney Parrish, RD Registered Dietitian  See Amion for more information

## 2024-03-20 NOTE — Care Management Important Message (Signed)
 Important Message  Patient Details  Name: Whitney Parrish MRN: [id number] Date of Birth: 1956-04-13   Important Message Given:  Yes - Medicare IM     [name] 03/20/2024, 1:23 PM

## 2024-03-21 ENCOUNTER — Other Ambulatory Visit (HOSPITAL_COMMUNITY): Payer: Self-pay

## 2024-03-21 ENCOUNTER — Telehealth (HOSPITAL_COMMUNITY): Payer: Self-pay

## 2024-03-21 DIAGNOSIS — Z7189 Other specified counseling: Secondary | ICD-10-CM | POA: Diagnosis not present

## 2024-03-21 DIAGNOSIS — N390 Urinary tract infection, site not specified: Secondary | ICD-10-CM

## 2024-03-21 DIAGNOSIS — R627 Adult failure to thrive: Secondary | ICD-10-CM | POA: Diagnosis not present

## 2024-03-21 DIAGNOSIS — B3781 Candidal esophagitis: Secondary | ICD-10-CM

## 2024-03-21 DIAGNOSIS — Z515 Encounter for palliative care: Secondary | ICD-10-CM | POA: Diagnosis not present

## 2024-03-21 DIAGNOSIS — K909 Intestinal malabsorption, unspecified: Secondary | ICD-10-CM | POA: Diagnosis not present

## 2024-03-21 DIAGNOSIS — R630 Anorexia: Secondary | ICD-10-CM | POA: Diagnosis not present

## 2024-03-21 DIAGNOSIS — K229 Disease of esophagus, unspecified: Secondary | ICD-10-CM | POA: Diagnosis not present

## 2024-03-21 DIAGNOSIS — K529 Noninfective gastroenteritis and colitis, unspecified: Secondary | ICD-10-CM | POA: Diagnosis not present

## 2024-03-21 DIAGNOSIS — R531 Weakness: Secondary | ICD-10-CM | POA: Diagnosis not present

## 2024-03-21 DIAGNOSIS — K8689 Other specified diseases of pancreas: Secondary | ICD-10-CM | POA: Diagnosis not present

## 2024-03-21 DIAGNOSIS — E43 Unspecified severe protein-calorie malnutrition: Secondary | ICD-10-CM

## 2024-03-21 LAB — CBC
HCT: 32.5 % — ABNORMAL LOW (ref 36.0–46.0)
Hemoglobin: 10.3 g/dL — ABNORMAL LOW (ref 12.0–15.0)
MCH: 30 pg (ref 26.0–34.0)
MCHC: 31.7 g/dL (ref 30.0–36.0)
MCV: 94.8 fL (ref 80.0–100.0)
Platelets: 201 K/uL (ref 150–400)
RBC: 3.43 MIL/uL — ABNORMAL LOW (ref 3.87–5.11)
RDW: 19 % — ABNORMAL HIGH (ref 11.5–15.5)
WBC: 9.8 K/uL (ref 4.0–10.5)
nRBC: 0 % (ref 0.0–0.2)

## 2024-03-21 LAB — BASIC METABOLIC PANEL WITH GFR
Anion gap: 13 (ref 5–15)
BUN: 36 mg/dL — ABNORMAL HIGH (ref 8–23)
CO2: 19 mmol/L — ABNORMAL LOW (ref 22–32)
Calcium: 8.4 mg/dL — ABNORMAL LOW (ref 8.9–10.3)
Chloride: 109 mmol/L (ref 98–111)
Creatinine, Ser: 2.01 mg/dL — ABNORMAL HIGH (ref 0.44–1.00)
GFR, Estimated: 26 mL/min — ABNORMAL LOW (ref >60)
Glucose, Bld: 65 mg/dL — ABNORMAL LOW (ref 70–99)
Potassium: 4.7 mmol/L (ref 3.5–5.1)
Sodium: 141 mmol/L (ref 135–145)

## 2024-03-21 LAB — SURGICAL PATHOLOGY

## 2024-03-21 LAB — GLUCOSE, CAPILLARY: Glucose-Capillary: 57 mg/dL — ABNORMAL LOW (ref 70–99)

## 2024-03-21 MED ORDER — LIDOCAINE VISCOUS HCL 2 % MT SOLN: 15.0000 mL | OROMUCOSAL | Status: DC | PRN

## 2024-03-21 MED ORDER — ACETAMINOPHEN 10 MG/ML IV SOLN
1000.0000 mg | Freq: Four times a day (QID) | INTRAVENOUS | Status: AC
  Administered 2024-03-21 – 2024-03-22 (×4): 1000 mg via INTRAVENOUS
  Filled 2024-03-21 (×4): qty 100

## 2024-03-21 MED ORDER — LIDOCAINE VISCOUS HCL 2 % MT SOLN
15.0000 mL | Freq: Three times a day (TID) | OROMUCOSAL | Status: AC
  Administered 2024-03-21 – 2024-03-22 (×3): 15 mL via OROMUCOSAL
  Filled 2024-03-21 (×3): qty 15

## 2024-03-21 MED ORDER — SODIUM CHLORIDE 0.45 % IV SOLN
INTRAVENOUS | Status: DC
  Filled 2024-03-21 (×4): qty 75

## 2024-03-21 MED ORDER — SODIUM CHLORIDE 0.9 % IV SOLN
1.0000 g | INTRAVENOUS | Status: DC
  Administered 2024-03-21 – 2024-03-24 (×4): 1 g via INTRAVENOUS
  Filled 2024-03-21 (×4): qty 10

## 2024-03-21 NOTE — Progress Notes (Signed)
 HD#7 SUBJECTIVE:  Patient Summary: Whitney Parrish is a 68 y.o. with a pertinent PMH of CKD stage III, chronic diarrhea, and GERD, who presented with a syncopal episode and admitted for syncope workup and failure to thrive .   Overnight Events: NAE  Interim History: The patient was seen and examined at the bedside this morning.  She was pleasantly interactive and states that she was feeling well overall but she did have some pain in her lower abdomen.  She was interactive with the conversation including asking what her medications were and why she was taking them.  OBJECTIVE:  Vital Signs: Vitals:   03/20/24 1744 03/20/24 1751 03/20/24 2116 03/21/24 0606  BP: 134/68 135/73 (!) 140/73 126/60  Pulse: 64 (!) 101 69 (!) 53  Resp: 18 18    Temp: (!) 97.5 F (36.4 C) 98 F (36.7 C) 98 F (36.7 C) (!) 97.4 F (36.3 C)  TempSrc:   Oral Oral  SpO2: 100% 100% 100% 100%  Weight:      Height:       Supplemental O2: Room Air SpO2: 100 % O2 Flow Rate (L/min): 3 L/min  Filed Weights   03/13/24 0800  Weight: 32.7 kg     Intake/Output Summary (Last 24 hours) at 03/21/2024 1120 Last data filed at 03/21/2024 0500 Gross per 24 hour  Intake 1115.86 ml  Output 700 ml  Net 415.86 ml   Net IO Since Admission: 4,193.57 mL [03/21/24 1120]  Physical Exam: Const: Frail appearing woman in no acute distress.  HENT: Normocephalic, atraumatic, mucus membranes moist Card: RRR, No MRG, No pitting edema on LE's bilaterally  Resp: LCTAB, no increased work of breathing Abd: Scaphoid abdomen, tender to palpation in suprapubic area.  No rebound tenderness or guarding. Extremities: Warm, dry, cachetic   Patient Lines/Drains/Airways Status     Active Line/Drains/Airways     Name Placement date Placement time Site Days   Peripheral IV 03/14/24 22 G 1 Posterior;Right Forearm 03/14/24  2029  Forearm  5   Wound 03/13/24 1739 Pressure Injury Buttocks Right Stage 2 -  Partial thickness loss of dermis  presenting as a shallow open injury with a red, pink wound bed without slough. 03/13/24  1739  Buttocks  6   Wound 03/14/24 Irritant Contact Dermatitis Buttocks Medial 03/14/24  --  Buttocks  5            Pertinent labs and imaging:     Latest Ref Rng & Units 03/21/2024    8:29 AM 03/20/2024    5:29 AM 03/19/2024    3:24 AM  CBC  WBC 4.0 - 10.5 K/uL 9.8  8.8  8.0   Hemoglobin 12.0 - 15.0 g/dL 89.6  9.4  6.7   Hematocrit 36.0 - 46.0 % 32.5  28.0  21.1   Platelets 150 - 400 K/uL 201  226  256        Latest Ref Rng & Units 03/21/2024    5:26 AM 03/20/2024    5:29 AM 03/19/2024    3:24 AM  CMP  Glucose 70 - 99 mg/dL 65  70  73   BUN 8 - 23 mg/dL 36  26  26   Creatinine 0.44 - 1.00 mg/dL 7.98  8.02  7.83   Sodium 135 - 145 mmol/L 141  143  144   Potassium 3.5 - 5.1 mmol/L 4.7  3.7  3.2   Chloride 98 - 111 mmol/L 109  112  115   CO2 22 -  32 mmol/L 19  23  21    Calcium 8.9 - 10.3 mg/dL 8.4  8.2  8.1     No results found.  ASSESSMENT/PLAN:  Assessment: Principal Problem:   Syncope Active Problems:   Dizziness   Gastroesophageal reflux disease   CKD (chronic kidney disease) stage 3, GFR 30-59 ml/min (HCC)   Protein-calorie malnutrition, severe   Acute renal failure   Chronic diarrhea   Weakness generalized   Pressure injury of skin   AKI (acute kidney injury)   Failure to thrive in adult   Hypoalbuminemia   Pancreatic insufficiency   Stage 4 chronic kidney disease (HCC)   Macrocytic anemia   Candidiasis, esophageal (HCC)   Chronic kidney disease (CKD), stage IV (severe) (HCC)   Diarrhea due to malabsorption   Goals of care, counseling/discussion   Severe protein-calorie malnutrition   Plan: #Esophageal candidiasis   -EGD performed on 11/15 with suspected esophageal candidiasis - Suspect this is contributing to poor oral intake as the patient is complaining of throat pain and difficulty swallowing. - Continue IV fluconazole  through 12/2.  #Chronic  diarrhea #Pancreatic insufficiency  -The patient was admitted on 11/11 for syncopal episode.  Suspect this is secondary to severe malnutrition and chronic diarrhea. -A colonoscopy was recommended however the patient was unable to tolerate bowel prep.  Discussed with GI this morning who reported that they would not pursue colonoscopy while patient is inpatient. -Fecal elastase 37, however this can be false in the setting of dilution from watery diarrhea.  This has been reordered and is pending.  If low, the patient would benefit from Creon however the patient reports a difficulty swallowing pills and has been spitting out oral medications. - At this time, awaiting further goals of care conversations with the family to dictate plans moving forward. -Continue Imodium 3 times daily  #Failure to thrive #Severe malnutrition - BMI 14.06 -Albumin 1.7 -The patient has been evaluated by dietary and is on nutritional supplements for severe malnutrition.   -We did discuss with the patient today who continues to report low appetite and a complete disinterest in eating.  The nurses support this history and states that she has not eaten anything but minimal nibbles over the past multiple days.  With the patient's severe malnutrition, she would need nutritional supplementation likely through feeding tube however at this time not sure the patient would handle procedure well or benefit greatly.  We will hold off as we continue to treat esophageal candidiasis and have goals of care discussions with the family. - Continue mirtazapine   #UTI -The patient is complaining of suprapubic pain for the past two days -UA obtained and indicative of UTI.  -Rocephin  initiated. Day 1/3  #AKI Resolved  #Anemia -Patient with a history of chronic anemia secondary to CKD. -Baseline 7-8 -s/p 1unit PRBC 11/17 with improvement in Hgb. Continue to trend CBC's  #Goals of Care -GOC discussed with patient's niece, [name], along  with palliative. Discussed feeding tube as well as aggressive work up vs palliative and hospice moving forward. [name] verbalized understanding of her aunt's overall state of health and will begin having conversations and making care decisions throughout the week. We will continue to hold off on aggressive measures such as a peg tube until these informed decisions can be made.  -Discussion scheduled for tomorrow morning.    Best Practice: Diet: Regular diet IVF: Fluids: none, Rate: None VTE: Pt refused injections and is unable to swallow pills--SCD's ordered.  Code: Full  Disposition planning:  Therapy Recs: Pending, DME: other pending Family Contact: [name] Fontana, to be notified. DISPO: Anticipated discharge pending to pending pending clinical improvement.  Signature:  Schuyler Novak, DO [facility]  11:20 AM, 03/21/2024  On Call pager [phone]

## 2024-03-21 NOTE — Progress Notes (Signed)
 PIV consult: pt with limited venous access options. Please consider central line if prolonged venous access is required.

## 2024-03-21 NOTE — Progress Notes (Addendum)
 Patient ID: Whitney Parrish, female   DOB: 08-04-1955, 68 y.o.   MRN: [id number]    Progress Note   Subjective   Day # 8 CC; failure to thrive, weight loss, diarrhea, weakness and patient with chronic kidney disease stage IV, chronic anxiety/depression history of psoriatic arthritis.  TTG/IgA negative Stool studies earlier this admit negative Fecal elastase on admission low at 37, repeat pending Patient attempted bowel prep for colonoscopy x 2 and was unable to complete either prep-today says I just cannot drink that  EGD this admission showed diffuse white plaques in the esophagus and small hiatal hernia.  Biopsies showed a nonspecific esophagitis, not definitive candidiasis, duodenal biopsies negative, no H. pylori per gastric biopsies.  Noncontrasted CT abdomen and pelvis on admission bilateral nephrolithiasis, and some liquid stool noted in the distal colon  Labs today hemoglobin 10.3, had drifted down to 6.7 earlier this week was transfused BUN 36/creatinine 2.0 LFTs have been normal Albumin 2.5  Patient alert this afternoon, visiting with her boyfriend and grandchildren who they watched her every afternoon she was interactive active, answered questions appropriately, oriented to person place month, could not recall what holiday was coming up, did not know the president.  Has any problems with abdominal pain.  She says I have had diarrhea for years Her boyfriend who has been looking after her says that the diarrhea seem to have been worse over this past month. Interestingly diarrhea has improved while she is here and the repeat fecal elastase has not been collected because she has not had diarrhea.  She has not had a bowel movement today. She has been trying to eat, boyfriends that she did eat most of her solid food at lunch today, says she knows she had not been eating well at home, and says part of that may be due to worry.   Objective   Vital signs in last 24 hours: Temp:   [97.4 F (36.3 C)-98 F (36.7 C)] 97.5 F (36.4 C) (11/19 1154) Pulse Rate:  [53-101] 53 (11/19 1154) Resp:  [17-18] 17 (11/19 1154) BP: (125-140)/(60-73) 125/69 (11/19 1154) SpO2:  [100 %] 100 % (11/19 1154) Last BM Date : 03/20/24 General:   Petite very frail very thin older African-American female in NAD, pleasant, conversant Heart:  Regular rate and rhythm; no murmurs Lungs: Respirations even and unlabored, lungs CTA bilaterally Abdomen:  Soft, nontender and nondistended. Normal bowel sounds. Extremities:  Without edema. Neurologic:  Alert and oriented,x3  grossly normal neurologically. Psych:  Cooperative. Normal mood and affect.  Intake/Output from previous day: 11/18 0701 - 11/19 0700 In: 1355.9 [P.O.:360; I.V.:895.9; IV Piggyback:100] Out: 700 [Urine:700] Intake/Output this shift: No intake/output data recorded.  Lab Results: Recent Labs    03/19/24 0324 03/20/24 0529 03/21/24 0829  WBC 8.0 8.8 9.8  HGB 6.7* 9.4* 10.3*  HCT 21.1* 28.0* 32.5*  PLT 256 226 201   BMET Recent Labs    03/19/24 0324 03/20/24 0529 03/21/24 0526  NA 144 143 141  K 3.2* 3.7 4.7  CL 115* 112* 109  CO2 21* 23 19*  GLUCOSE 73 70 65*  BUN 26* 26* 36*  CREATININE 2.16* 1.97* 2.01*  CALCIUM 8.1* 8.2* 8.4*   LFT Recent Labs    03/19/24 0324  ALBUMIN 1.7*   PT/INR No results for input(s): LABPROT, INR in the last 72 hours.       Assessment / Plan:    #42 68 year old African-American female readmitted 8 days ago, after previous admission with  pneumonia in October.  Came back in with progressive weakness, diarrhea, poor oral intake.  Diarrhea workup thus far pertinent only for low fecal elastase.  Interestingly her diarrhea has seemed to significantly improve this week. She was unable to complete a bowel prep after 2 attempts last weekend and we do not plan to pursue colonoscopy  She is being treated for acute esophagitis, continue twice daily PPI x 2 weeks, then once  daily chronically. OK to complete course of Diflucan  though candidiasis not definitely seen on biopsies  Not unreasonable to adding Megace.  MRI of her abdomen/attention to pancreas given possibility of pancreatic insufficiency i.e. to evaluate for evidence of chronic pancreatitis Alternatively could start her on a trial of Creon with meals  Appetite is another issue-on Remeron , could consider adding Megace Just with her today how important it is for her to eat and to drink protein shakes, and treat them as medicine.  She is very clear that she would not want to have any kind of a feeding tube, but she does want to get stronger  Would have her goal to be 3 protein shakes per day  Also feel there may be a component of underlying chronic depression/anxiety  #2 dementia versus metabolic encephalopathy/versus combination of both She did reasonably well with her conversation today, was interacting with her grandchildren and her boyfriend.  #3 chronic kidney disease stage IV #4 psoriatic arthritis #5 chronic anemia  Her boyfriend/significant other is aware of goals of care meeting for tomorrow, he does seem supportive and wants her to be able to return to home.     Principal Problem:   Syncope Active Problems:   Dizziness   Gastroesophageal reflux disease   CKD (chronic kidney disease) stage 3, GFR 30-59 ml/min (HCC)   Protein-calorie malnutrition, severe   Acute renal failure   Chronic diarrhea   Weakness generalized   Pressure injury of skin   AKI (acute kidney injury)   Failure to thrive in adult   Hypoalbuminemia   Pancreatic insufficiency   Stage 4 chronic kidney disease (HCC)   Macrocytic anemia   Candidiasis, esophageal (HCC)   Chronic kidney disease (CKD), stage IV (severe) (HCC)   Diarrhea due to malabsorption   Goals of care, counseling/discussion   Severe protein-calorie malnutrition     LOS: 7 days   Amy EsterwoodPA-C  03/21/2024, 3:15 PM    Attending  physician's note   I have taken a history, reviewed the chart, and examined the patient. I performed a substantive portion of this encounter, including complete performance of at least one of the key components, in conjunction with the APP. I agree with the APP's note, impression, and recommendations with my edits.   Fecal pancreatic elastase was very low, but in the setting of watery diarrhea and even confounded by recent bowel preparation, high chance that this was lab error 2/2 dilution effect.  She is otherwise not c/o diarrhea today, but is also ordered for scheduled Imodium.  Will plan on MRI abdomen to evaluate for pancreatic atrophy, and if loose stools can check elastase again.  Discussed decreased p.o. intake and reduced appetite at length with patient and significant other at bedside.  She is clearly able to verbalize that she would not want a feeding tube.  Will try increasing intake of protein shakes to try to increase caloric intake.  Agree that there could be an issue depression playing a role.  Consider adding appetite stimulant.  Agree with addition of viscous lidocaine   for any element of odynophagia.    GOC conversation scheduled for tomorrow.  GI service will follow-up afterwards.  A total of 50 minutes of time was spent on this encounter, including in depth chart review, independent review of results as outlined above, communicating results with the patient directly, face-to-face time with the patient, coordinating care, and ordering studies and medications as appropriate, and documentation.   [id number] DO, FACG [phone] office

## 2024-03-21 NOTE — Plan of Care (Signed)
 Patient has still poor appetite. Able to take meds crushed with apple sauce. Needs attended. Family at bedside. Bed alarm on. Problem: Education: Goal: Knowledge of General Education information will improve Description: Including pain rating scale, medication(s)/side effects and non-pharmacologic comfort measures Outcome: Progressing   Problem: Health Behavior/Discharge Planning: Goal: Ability to manage health-related needs will improve Outcome: Progressing   Problem: Clinical Measurements: Goal: Ability to maintain clinical measurements within normal limits will improve Outcome: Progressing Goal: Will remain free from infection Outcome: Progressing Goal: Diagnostic test results will improve Outcome: Progressing Goal: Respiratory complications will improve Outcome: Progressing Goal: Cardiovascular complication will be avoided Outcome: Progressing   Problem: Activity: Goal: Risk for activity intolerance will decrease Outcome: Progressing   Problem: Nutrition: Goal: Adequate nutrition will be maintained Outcome: Progressing   Problem: Coping: Goal: Level of anxiety will decrease Outcome: Progressing   Problem: Elimination: Goal: Will not experience complications related to bowel motility Outcome: Progressing Goal: Will not experience complications related to urinary retention Outcome: Progressing   Problem: Pain Managment: Goal: General experience of comfort will improve and/or be controlled Outcome: Progressing   Problem: Safety: Goal: Ability to remain free from injury will improve Outcome: Progressing   Problem: Skin Integrity: Goal: Risk for impaired skin integrity will decrease Outcome: Progressing

## 2024-03-21 NOTE — Progress Notes (Signed)
 Physical Therapy Treatment Patient Details Name: Whitney Parrish MRN: [id number] DOB: 07/13/1955 Today's Date: 03/21/2024   History of Present Illness Pt is a 68 y.o. female presenting 03/13/24 with syncope and generalized weakness for the past 2 weeks. Workup for failure to thrive, worsening chronic diarrhea, NAGMA, and acute metabolic encephalopathy. Pt underwent Upper GI endoscopy 11/15 which demonstrated esophageal plaques suspicious for candidiasis, erosive gastropathy, and 3 duodenal polyps.  PMH: CKD4, hypokalemia, asthma, GERD, psoriasis, depression, and insomnia.  Of note, recent hospitalization 10/18-10/25 for syncopal episode found to have severe hypokalemia secondary to chronic diarrhea    PT Comments  Pt with improved alertness this date. She was oriented to self, time, and place. Pt continues to be confused about her current health situation and demonstrated poor safety awareness. She will required direct supervision and hands-on physical assist to mobilize safely. Pt ambulated using RW with minA and a close chair follow. She is not receptive to multi-modal cues for improved technique demonstrating a very slow, severely NBOS, and step-to gait pattern. Will continue to follow acutely and advance appropriately.      If plan is discharge home, recommend the following: A lot of help with bathing/dressing/bathroom;Assistance with cooking/housework;Assist for transportation;Help with stairs or ramp for entrance;Supervision due to cognitive status;A little help with walking and/or transfers;Direct supervision/assist for medications management   Can travel by private vehicle        Equipment Recommendations  Rolling walker (2 wheels);BSC/3in1;Other (comment) (transport chair)    Recommendations for Other Services       Precautions / Restrictions Precautions Precautions: Fall Recall of Precautions/Restrictions: Impaired Restrictions Weight Bearing Restrictions Per Provider Order: No      Mobility  Bed Mobility Overal bed mobility: Needs Assistance Bed Mobility: Supine to Sit, Sit to Supine     Supine to sit: Min assist, HOB elevated, Used rails Sit to supine: Contact guard assist, HOB elevated   General bed mobility comments: Pt sat up on L side of bed with increased time. Assist to bring BLE towards EOB and elevate trunk. Pt pulled on rails and PT. She scooted fwd with BUE support. Returning to bed pt eased trunk down, assist to manage BLE and reposition in bed.    Transfers Overall transfer level: Needs assistance Equipment used: Rolling walker (2 wheels) Transfers: Sit to/from Stand Sit to Stand: Min assist           General transfer comment: Pt stood from lowest bed height and recliner chair. Cued proper hand/foot placement and sequencing. She powered up with minA. Good eccentric control, reaching back for arm rest or bed.    Ambulation/Gait Ambulation/Gait assistance: Min assist, +2 safety/equipment (Chair Follow) Gait Distance (Feet): 75 Feet (x2, prolonged seated rest break) Assistive device: Rolling walker (2 wheels) Gait Pattern/deviations: Step-to pattern, Decreased step length - right, Decreased step length - left, Decreased stride length, Narrow base of support, Trunk flexed, Drifts right/left Gait velocity: decreased Gait velocity interpretation: <1.31 ft/sec, indicative of household ambulator   General Gait Details: Pt ambulated with very slow short steps and a NBOS. Cues for increase step length, increase BOS, increase foot clearence, and proximity to RW. Pt demonstrated a fwd lean and tendency to fixate on ground. Cued fwd gaze and assist to [facility] RW correctly. Pt would get too close to objects when walking up/down the hallway on her R when going towards the window and the L when heading back to her room. Chair follow for safety to allow pt to rest.  Stairs             Wheelchair Mobility     Tilt Bed    Modified  Rankin (Stroke Patients Only)       Balance Overall balance assessment: Needs assistance Sitting-balance support: No upper extremity supported, Feet supported Sitting balance-Leahy Scale: Good     Standing balance support: Bilateral upper extremity supported, During functional activity Standing balance-Leahy Scale: Poor Standing balance comment: pt dependent on RW                            Communication Communication Communication: No apparent difficulties  Cognition Arousal: Alert Behavior During Therapy: WFL for tasks assessed/performed   PT - Cognitive impairments: Orientation, Awareness, Memory, Sequencing, Problem solving, Safety/Judgement, Attention   Orientation impairments: Situation (Pt knows she is in the hospital, but isn't sure why or what all has occured during this admit.)                   PT - Cognition Comments: Pt A,Ox3. She engaged in conversations appropriately. Pt has decreased insight into current condition and poor safety awareness. Following commands: Impaired Following commands impaired: Follows one step commands with increased time    Cueing Cueing Techniques: Verbal cues, Gestural cues, Tactile cues  Exercises      General Comments General comments (skin integrity, edema, etc.): VSS on RA. Partner [name] and 2 grandkids present and supportive throughout session.      Pertinent Vitals/Pain Pain Assessment Pain Assessment: Faces Faces Pain Scale: Hurts a little bit Pain Location: Generalized Pain Descriptors / Indicators: Discomfort, Aching Pain Intervention(s): Monitored during session, Limited activity within patient's tolerance, Repositioned    Home Living                          Prior Function            PT Goals (current goals can now be found in the care plan section) Acute Rehab PT Goals Patient Stated Goal: Return Home PT Goal Formulation: With family Time For Goal Achievement: 03/28/24 Potential  to Achieve Goals: Fair Progress towards PT goals: Progressing toward goals    Frequency    Min 2X/week      PT Plan      Co-evaluation              AM-PAC PT 6 Clicks Mobility   Outcome Measure  Help needed turning from your back to your side while in a flat bed without using bedrails?: A Little Help needed moving from lying on your back to sitting on the side of a flat bed without using bedrails?: A Little Help needed moving to and from a bed to a chair (including a wheelchair)?: A Little Help needed standing up from a chair using your arms (e.g., wheelchair or bedside chair)?: A Little Help needed to walk in hospital room?: A Little Help needed climbing 3-5 steps with a railing? : A Lot 6 Click Score: 17    End of Session Equipment Utilized During Treatment: Gait belt Activity Tolerance: Patient tolerated treatment well;Patient limited by fatigue Patient left: in bed;with call bell/phone within reach;with bed alarm set;with family/visitor present;Other (comment) (bed in chair position) Nurse Communication: Mobility status PT Visit Diagnosis: Adult, failure to thrive  (R62.7);Muscle weakness (generalized) (M62.81);Difficulty in walking, not elsewhere classified (R26.2);Unsteadiness on feet (R26.81)     Time: 8479-8456 PT Time Calculation (min) (ACUTE ONLY): 23  min  Charges:    $Gait Training: 8-22 mins $Therapeutic Activity: 8-22 mins PT General Charges $$ ACUTE PT VISIT: 1 Visit                     Randall SAUNDERS, PT, DPT Acute Rehabilitation Services Office: [phone] Secure Chat Preferred  Delon CHRISTELLA Callander 03/21/2024, 4:57 PM

## 2024-03-21 NOTE — Progress Notes (Signed)
 Daily Progress Note   Date: 03/21/2024   Patient Name: Whitney Parrish  DOB: [birth year]  MRN: [id number]  Age / Sex: 68 y.o., female  Attending Physician: Lovie Clarity, MD Primary Care Physician: Phyllis Jereld BROCKS, NP Admit Date: 03/13/2024 Length of Stay: 7 days  Reason for Follow-up: Establishing goals of care  Past Medical History:  Diagnosis Date   Anxiety    Arthritis    shoulders and hands   Asthma    Benign positional vertigo    Chronically dry eyes    CKD (chronic kidney disease), stage III (HCC)    Depression    Dyspnea on exertion    GERD (gastroesophageal reflux disease)    Hematuria    History of acute pyelonephritis    History of Bell's palsy    1980's left side-- residual lip numbness intermittant   History of chest pain    non-cardiac per dr claudene notes (pt's pcp)   History of panic attacks    Hypertension    Left ureteral stone    Loose bowel movements    chronic-per pt residual from injury during abdominal surgery in 1986   Migraines    Nephrolithiasis    left    Psoriasis    Urgency of urination    Wears dentures    has upper and lower but only wears upper    Assessment & Plan:   HPI/Patient Profile:  68 y.o. female  with past medical history of CKD IV, hypokalemia, psoriasis, depression admitted on 03/13/2024 with AKI on CKD, severe protein calorie malnutrition, and acute metabolic encephalopathy. Patient most recently admitted 10/18-10/25 for a similar presentation with syncope and generalized weakness.   Palliative medicine consulted for goals of care conversation.    SUMMARY OF RECOMMENDATIONS Continue full code, full scope, had a frank discussion with niece [name]) about futility of continued interventions and risks vs benefits Will continue GOC discussion on 11/20 with [name] in person for possible pathways including Rehab, Rehab and palliative outpatient following for deterioration, or full comfort measures  Symptom  Management:  Per primary team  Code Status: Full Code  Prognosis: < 6 months  Discharge Planning: To Be Determined  Discussed with: [name] DO about continued plans to follow along with further GOC with meeting planned for 11/20 with niece and partner.   Subjective:   Subjective: Chart Reviewed. Updates received. Patient Assessed. Created space and opportunity for patient  and family to explore thoughts and feelings regarding current medical situation.  Today's Discussion: Today before meeting with the patient/family, I reviewed the chart notes including progress note on 11/19 by [facility] DO. I also reviewed vital signs, nursing flowsheets, medication administrations record, labs, and imaging.  Patient remains confused with partner Kristan) bedside. Spoke with [name] and updated him possible pathways going forward including rehab, rehab with palliative outpatient following, or transitioning to full comfort measures. [name] was understandably tearful during visit and provided time and space to process. Acknowledged that there is a goals of care discussion on 11/20 with patient's niece [name]). Did state that he will defer to her for decisions going forward.   Review of Systems  Unable to perform ROS  Objective:   Primary Diagnoses: Present on Admission:  Syncope  Gastroesophageal reflux disease  CKD (chronic kidney disease) stage 3, GFR 30-59 ml/min (HCC)  Protein-calorie malnutrition, severe  Acute renal failure  Chronic diarrhea   Vital Signs:  BP 125/69 (BP Location: Right Arm)   Pulse (!) 53   Temp (!)  97.5 F (36.4 C) (Oral)   Resp 17   Ht 5' (1.524 m)   Wt 32.7 kg   SpO2 100%   BMI 14.06 kg/m   Physical Exam HENT:     Head: Normocephalic.     Nose: Nose normal.     Mouth/Throat:     Mouth: Mucous membranes are dry.  Eyes:     Extraocular Movements: Extraocular movements intact.  Cardiovascular:     Rate and Rhythm: Normal rate.  Pulmonary:     Effort:  Pulmonary effort is normal.  Abdominal:     General: Abdomen is flat.  Musculoskeletal:     Right lower leg: No edema.     Left lower leg: No edema.  Skin:    General: Skin is warm.  Neurological:     Mental Status: She is alert. She is disoriented.    Palliative Assessment/Data: 20%   Existing Vynca/ACP Documentation: None  Thank you for allowing us  to participate in the care of BAILYNN DYK PMT will continue to support holistically.  I personally spent a total of 25 minutes in the care of the patient today including preparing to see the patient, getting/reviewing separately obtained history, performing a medically appropriate exam/evaluation, referring and communicating with other health care professionals, and documenting clinical information in the EHR.   [name]  Palliative Medicine Team  Team Phone # [phone] (Nights/Weekends) 03/21/2024 2:38 PM

## 2024-03-21 NOTE — Telephone Encounter (Signed)
 Pharmacy Patient Advocate Encounter  Insurance verification completed.    The patient is insured through [facility]. Patient has Medicare and is not eligible for a copay card, but may be able to apply for patient assistance or Medicare RX Payment Plan (Patient Must reach out to their plan, if eligible for payment plan), if available.    Ran test claim for Breo Ellipta  200-23mcg and the current 30 day co-pay is $0.   This test claim was processed through [facility]- copay amounts may vary at other pharmacies due to [employer], or as the patient moves through the different stages of their insurance plan.

## 2024-03-22 ENCOUNTER — Encounter: Admitting: Adult Health

## 2024-03-22 DIAGNOSIS — Z681 Body mass index (BMI) 19 or less, adult: Secondary | ICD-10-CM | POA: Diagnosis not present

## 2024-03-22 DIAGNOSIS — K529 Noninfective gastroenteritis and colitis, unspecified: Secondary | ICD-10-CM | POA: Diagnosis not present

## 2024-03-22 DIAGNOSIS — Z515 Encounter for palliative care: Secondary | ICD-10-CM | POA: Diagnosis not present

## 2024-03-22 DIAGNOSIS — K8681 Exocrine pancreatic insufficiency: Secondary | ICD-10-CM

## 2024-03-22 DIAGNOSIS — N183 Chronic kidney disease, stage 3 unspecified: Secondary | ICD-10-CM | POA: Diagnosis not present

## 2024-03-22 DIAGNOSIS — R627 Adult failure to thrive: Secondary | ICD-10-CM | POA: Diagnosis not present

## 2024-03-22 DIAGNOSIS — E43 Unspecified severe protein-calorie malnutrition: Secondary | ICD-10-CM | POA: Diagnosis not present

## 2024-03-22 LAB — BASIC METABOLIC PANEL WITH GFR
Anion gap: 11 (ref 5–15)
BUN: 28 mg/dL — ABNORMAL HIGH (ref 8–23)
CO2: 27 mmol/L (ref 22–32)
Calcium: 6.7 mg/dL — ABNORMAL LOW (ref 8.9–10.3)
Chloride: 101 mmol/L (ref 98–111)
Creatinine, Ser: 1.58 mg/dL — ABNORMAL HIGH (ref 0.44–1.00)
GFR, Estimated: 35 mL/min — ABNORMAL LOW (ref >60)
Glucose, Bld: 78 mg/dL (ref 70–99)
Potassium: 4.3 mmol/L (ref 3.5–5.1)
Sodium: 139 mmol/L (ref 135–145)

## 2024-03-22 LAB — GLUCOSE, CAPILLARY
Glucose-Capillary: 124 mg/dL — ABNORMAL HIGH (ref 70–99)
Glucose-Capillary: 143 mg/dL — ABNORMAL HIGH (ref 70–99)
Glucose-Capillary: 199 mg/dL — ABNORMAL HIGH (ref 70–99)
Glucose-Capillary: 51 mg/dL — ABNORMAL LOW (ref 70–99)
Glucose-Capillary: 56 mg/dL — ABNORMAL LOW (ref 70–99)
Glucose-Capillary: 60 mg/dL — ABNORMAL LOW (ref 70–99)
Glucose-Capillary: 61 mg/dL — ABNORMAL LOW (ref 70–99)
Glucose-Capillary: 66 mg/dL — ABNORMAL LOW (ref 70–99)

## 2024-03-22 LAB — CBC
HCT: 27.8 % — ABNORMAL LOW (ref 36.0–46.0)
Hemoglobin: 9 g/dL — ABNORMAL LOW (ref 12.0–15.0)
MCH: 30.7 pg (ref 26.0–34.0)
MCHC: 32.4 g/dL (ref 30.0–36.0)
MCV: 94.9 fL (ref 80.0–100.0)
Platelets: 194 K/uL (ref 150–400)
RBC: 2.93 MIL/uL — ABNORMAL LOW (ref 3.87–5.11)
RDW: 18.4 % — ABNORMAL HIGH (ref 11.5–15.5)
WBC: 6.6 K/uL (ref 4.0–10.5)
nRBC: 0 % (ref 0.0–0.2)

## 2024-03-22 LAB — MAGNESIUM: Magnesium: 1.7 mg/dL (ref 1.7–2.4)

## 2024-03-22 MED ORDER — DEXTROSE-SODIUM CHLORIDE 10-0.45 % IV SOLN
INTRAVENOUS | Status: AC
  Filled 2024-03-22: qty 1000

## 2024-03-22 MED ORDER — DEXTROSE-SODIUM CHLORIDE 10-0.45 % IV SOLN
INTRAVENOUS | Status: DC
  Filled 2024-03-22: qty 1000

## 2024-03-22 MED ORDER — DEXTROSE 50 % IV SOLN
1.0000 | Freq: Once | INTRAVENOUS | Status: AC
  Administered 2024-03-22: 50 mL via INTRAVENOUS
  Filled 2024-03-22: qty 50

## 2024-03-22 MED ORDER — DEXTROSE-SODIUM CHLORIDE 5-0.45 % IV SOLN: INTRAVENOUS | Status: DC

## 2024-03-22 NOTE — Progress Notes (Signed)
 Occupational Therapy Treatment Patient Details Name: Whitney Parrish MRN: [id number] DOB: 1956/03/20 Today's Date: 03/22/2024   History of present illness Pt is a 68 y.o. female presenting 03/13/24 with syncope and generalized weakness for the past 2 weeks. Workup for failure to thrive, worsening chronic diarrhea, NAGMA, and acute metabolic encephalopathy. Pt underwent Upper GI endoscopy 11/15 which demonstrated esophageal plaques suspicious for candidiasis, erosive gastropathy, and 3 duodenal polyps.  PMH: CKD4, hypokalemia, asthma, GERD, psoriasis, depression, and insomnia.  Of note, recent hospitalization 10/18-10/25 for syncopal episode found to have severe hypokalemia secondary to chronic diarrhea   OT comments  Pt up in recliner upon therapy arrival and agreeable to participate in OT treatment session. Session focused on functional mobility, ADL re-training, and safety awareness during dynamic standing balance tasks. Pt demonstrates difficulty maintaining an upright standing posture and LOB of balance towards right side while using RW in bathroom navigating towards toilet for use. Requires increased cueing for safety d/t cognition while demonstrating increased difficulty with carry over of education and decreased safety awareness and judgement. Pt continues to be appropriate for follow up home health OT services at discharge.       If plan is discharge home, recommend the following:  A little help with walking and/or transfers;A lot of help with bathing/dressing/bathroom;Assistance with cooking/housework;Direct supervision/assist for financial management;Assist for transportation;Help with stairs or ramp for entrance;Direct supervision/assist for medications management;Supervision due to cognitive status   Equipment Recommendations  BSC/3in1       Precautions / Restrictions Precautions Precautions: Fall Recall of Precautions/Restrictions: Impaired Restrictions Weight Bearing Restrictions  Per Provider Order: No       Mobility   Transfers Overall transfer level: Needs assistance Equipment used: Rolling walker (2 wheels) Transfers: Sit to/from Stand, Bed to chair/wheelchair/BSC Sit to Stand: Contact guard assist     Step pivot transfers: Contact guard assist     General transfer comment: Cues to initiate transfer and sit to stand with RW.     Balance Overall balance assessment: Needs assistance Sitting-balance support: No upper extremity supported, Feet supported Sitting balance-Leahy Scale: Good     Standing balance support: Bilateral upper extremity supported, During functional activity Standing balance-Leahy Scale: Poor Standing balance comment: Dependent on external support for balance. Forward flexed posture and unable to correct standing posture when cued. LOB towards right side demonstrated while completing ambulatory transfer and functional mobility. Required physical assist to correct.        ADL either performed or assessed with clinical judgement   ADL Overall ADL's : Needs assistance/impaired     Grooming: Wash/dry hands;Supervision/safety;Standing    Toilet Transfer: Contact guard assist;Cueing for safety;Ambulation;Regular Toilet;Rolling walker (2 wheels)   Toileting- Clothing Manipulation and Hygiene: Contact guard assist;Cueing for safety;Sit to/from stand                   Communication Communication Communication: No apparent difficulties   Cognition Arousal: Alert Behavior During Therapy: WFL for tasks assessed/performed Cognition: Cognition impaired   Orientation impairments: Time, Situation Awareness: Intellectual awareness impaired, Online awareness impaired Memory impairment (select all impairments): Short-term memory, Working civil service fast streamer, Conservation officer, historic buildings Attention impairment (select first level of impairment): Selective attention Executive functioning impairment (select all impairments): Organization,  Sequencing, Reasoning, Problem solving    Following commands: Impaired Following commands impaired: Follows one step commands with increased time, Follows one step commands inconsistently      Cueing   Cueing Techniques: Verbal cues, Tactile cues, Visual cues, Gestural cues  Exercises  Pertinent Vitals/ Pain       Pain Assessment Pain Assessment: Faces Faces Pain Scale: No hurt         Frequency  Min 2X/week        Progress Toward Goals  OT Goals(current goals can now be found in the care plan section)  Progress towards OT goals: Progressing toward goals     Plan         AM-PAC OT 6 Clicks Daily Activity     Outcome Measure   Help from another person eating meals?: None Help from another person taking care of personal grooming?: A Little Help from another person toileting, which includes using toliet, bedpan, or urinal?: A Little Help from another person bathing (including washing, rinsing, drying)?: A Little Help from another person to put on and taking off regular upper body clothing?: A Little Help from another person to put on and taking off regular lower body clothing?: A Little 6 Click Score: 19    End of Session Equipment Utilized During Treatment: Gait belt;Rolling walker (2 wheels)  OT Visit Diagnosis: Unsteadiness on feet (R26.81);History of falling (Z91.81);Muscle weakness (generalized) (M62.81);Other symptoms and signs involving cognitive function   Activity Tolerance Patient tolerated treatment well   Patient Left in chair;with call bell/phone within reach;with chair alarm set   Nurse Communication          Time: 1027-1050 OT Time Calculation (min): 23 min  Charges: OT General Charges $OT Visit: 1 Visit OT Treatments $Self Care/Home Management : 23-37 mins  Leita Howell, OTR/L,CBIS  Supplemental OT - [facility] and [facility] Secure Chat Preferred    Trevia Nop, Leita BIRCH 03/22/2024, 10:57 AM

## 2024-03-22 NOTE — Care Plan (Signed)
 Patient has poor appetite, only drank coffee this morning. BS was 56. Offered orange juice but just had sip of orange juice. MD notified and aware. Rechecked sugar @ 0936 it was 60. MD aware and said they will address on it. Pt sat on a chair and was able to take her meds crushed with apple sauce.

## 2024-03-22 NOTE — Progress Notes (Signed)
 HD#8 SUBJECTIVE:  Patient Summary: Whitney Parrish is a 68 y.o. with a pertinent PMH of CKD stage III, chronic diarrhea, and GERD, who presented with a syncopal episode and admitted for syncope workup and failure to thrive .   Overnight Events: NAE  Interim History: The patient was seen and examined today sitting up in the chair.  She was very pleasant and interactive with the team today that she was feeling very cold but well overall.  She did report that she has not had a bowel movement today which was confirmed by the nurse.  OBJECTIVE:  Vital Signs: Vitals:   03/21/24 1729 03/21/24 2148 03/22/24 0537 03/22/24 0958  BP: 132/83 (!) 114/58 (!) 117/55 (!) 141/89  Pulse: 72 (!) 55 (!) 49 62  Resp: 17 16 16 15   Temp: 98.1 F (36.7 C) 97.9 F (36.6 C) 97.8 F (36.6 C) 97.8 F (36.6 C)  TempSrc: Oral Oral Oral Oral  SpO2: 100% 100% 91%   Weight:      Height:       Supplemental O2: Room Air SpO2: 91 % O2 Flow Rate (L/min): 3 L/min  Filed Weights   03/13/24 0800  Weight: 32.7 kg     Intake/Output Summary (Last 24 hours) at 03/22/2024 1458 Last data filed at 03/22/2024 1000 Gross per 24 hour  Intake 1335.23 ml  Output 1100 ml  Net 235.23 ml   Net IO Since Admission: 5,480.75 mL [03/22/24 1458]  Physical Exam: Const: Frail appearing woman in no acute distress sitting up in chair HENT: Normocephalic, atraumatic, mucus membranes moist Card: RRR, No MRG, No pitting edema on LE's bilaterally  Resp: LCTAB, no increased work of breathing Abd: Scaphoid abdomen, nontender to palpation. Extremities: Warm, dry, cachetic   Patient Lines/Drains/Airways Status     Active Line/Drains/Airways     Name Placement date Placement time Site Days   Peripheral IV 03/14/24 22 G 1 Posterior;Right Forearm 03/14/24  2029  Forearm  5   Wound 03/13/24 1739 Pressure Injury Buttocks Right Stage 2 -  Partial thickness loss of dermis presenting as a shallow open injury with a red, pink wound  bed without slough. 03/13/24  1739  Buttocks  6   Wound 03/14/24 Irritant Contact Dermatitis Buttocks Medial 03/14/24  --  Buttocks  5            Pertinent labs and imaging:     Latest Ref Rng & Units 03/22/2024    4:48 AM 03/21/2024    8:29 AM 03/20/2024    5:29 AM  CBC  WBC 4.0 - 10.5 K/uL 6.6  9.8  8.8   Hemoglobin 12.0 - 15.0 g/dL 9.0  89.6  9.4   Hematocrit 36.0 - 46.0 % 27.8  32.5  28.0   Platelets 150 - 400 K/uL 194  201  226        Latest Ref Rng & Units 03/22/2024    4:48 AM 03/21/2024    5:26 AM 03/20/2024    5:29 AM  CMP  Glucose 70 - 99 mg/dL 78  65  70   BUN 8 - 23 mg/dL 28  36  26   Creatinine 0.44 - 1.00 mg/dL 8.41  7.98  8.02   Sodium 135 - 145 mmol/L 139  141  143   Potassium 3.5 - 5.1 mmol/L 4.3  4.7  3.7   Chloride 98 - 111 mmol/L 101  109  112   CO2 22 - 32 mmol/L 27  19  23  Calcium 8.9 - 10.3 mg/dL 6.7  8.4  8.2     No results found.  ASSESSMENT/PLAN:  Assessment: Principal Problem:   Syncope Active Problems:   Dizziness   Gastroesophageal reflux disease   CKD (chronic kidney disease) stage 3, GFR 30-59 ml/min (HCC)   Protein-calorie malnutrition, severe   Acute renal failure   Chronic diarrhea   Weakness generalized   Pressure injury of skin   AKI (acute kidney injury)   Failure to thrive in adult   Hypoalbuminemia   Pancreatic insufficiency   Stage 4 chronic kidney disease (HCC)   Macrocytic anemia   Candidiasis, esophageal (HCC)   Chronic kidney disease (CKD), stage IV (severe) (HCC)   Diarrhea due to malabsorption   Goals of care, counseling/discussion   Severe protein-calorie malnutrition   Decreased appetite   Plan: #Failure to thrive #Severe malnutrition - BMI 14.06 - The patient continues to endorse very limited appetite and reports that he just does not feel like eating S-he is beginning to become hypoglycemic from lack of oral intake. -At this point the patient is continuously declining oral intake including  nutritional supplements.  In order to sustain her nutrition, she will need tube feeds in the form of Dobbhoff or PEG tube.  I do not feel like in the long run this is a good option for her as she is exhibiting a failure to thrive picture. -We will continue to discuss goals of care with the family and trial a Dobbhoff if that is what the patient and family desire. -For today, continue D5 half-normal saline due to glycemia  #Esophageal candidiasis   - EGD performed on 11/15 with suspected esophageal candidiasis - Suspect this is contributing to poor oral intake.  The patient does report improving pain. - Continue IV fluconazole  through 12/2. - Continue viscous lidocaine  for symptomatic relief.  #Chronic diarrhea #Pancreatic insufficiency  -The patient was admitted on 11/11 for a syncopal episode.  Suspect this is secondary to severe malnutrition and chronic diarrhea. -A colonoscopy was recommended however the patient was unable to tolerate bowel prep.  Discussed with GI this morning who reported that they would not pursue colonoscopy while patient is inpatient. - Awaiting repeat fecal elastase however this has not been collected as patient's bowel movements have slowed down secondary to Imodium  #UTI -UA obtained and indicative of UTI.  Patient was reporting suprapubic pain. -Rocephin  initiated. Day 2/3  #AKI Resolved  #Anemia -Patient with a history of chronic anemia secondary to CKD. -Baseline 7-8 -s/p 1unit PRBC 11/17 with improvement in Hgb. Continue to trend CBC's  #Goals of Care -GOC discussed with patient's niece, [name], along with palliative. Discussed feeding tube as well as aggressive work up vs palliative and hospice moving forward. [name] verbalized understanding of her aunt's overall state of health and will begin having conversations and making care decisions throughout the week. We will continue to hold off on aggressive measures such as a peg tube until these informed  decisions can be made.  - Discussion was scheduled with the family today however they were unable to be in the hospital for the conversation.  She has been delayed until the afternoon in which palliative will take delay.  At this point we will discuss with the family that our options are a Dobbhoff trial with tube feeds or transitioning towards end-of-life care with comfort measures.  Will continue to ensure good communication between all parties involved.   Best Practice: Diet: Regular diet IVF: Fluids: none, Rate:  None VTE: Place and maintain sequential compression device Start: 03/21/24 1144Pt refused injections and is unable to swallow pills--SCD's ordered.  Code: Full  Disposition planning: Therapy Recs: Pending, DME: other pending Family Contact: [name] Fontana, to be notified. DISPO: Anticipated discharge pending to pending pending clinical improvement.  Signature:  Schuyler Novak, DO [facility]  2:58 PM, 03/22/2024  On Call pager [phone]

## 2024-03-22 NOTE — Plan of Care (Signed)
   Problem: Skin Integrity: Goal: Risk for impaired skin integrity will decrease Outcome: Progressing

## 2024-03-22 NOTE — Progress Notes (Signed)
 Daily Progress Note   Date: 03/22/2024   Patient Name: Whitney Parrish  DOB: 1956/02/05  MRN: [id number]  Age / Sex: 68 y.o., female  Attending Physician: Lovie Clarity, MD Primary Care Physician: Phyllis Jereld BROCKS, NP Admit Date: 03/13/2024 Length of Stay: 8 days  Reason for Follow-up: Establishing goals of care  Past Medical History:  Diagnosis Date   Anxiety    Arthritis    shoulders and hands   Asthma    Benign positional vertigo    Chronically dry eyes    CKD (chronic kidney disease), stage III (HCC)    Depression    Dyspnea on exertion    GERD (gastroesophageal reflux disease)    Hematuria    History of acute pyelonephritis    History of Bell's palsy    1980's left side-- residual lip numbness intermittant   History of chest pain    non-cardiac per dr claudene notes (pt's pcp)   History of panic attacks    Hypertension    Left ureteral stone    Loose bowel movements    chronic-per pt residual from injury during abdominal surgery in 1986   Migraines    Nephrolithiasis    left    Psoriasis    Urgency of urination    Wears dentures    has upper and lower but only wears upper    Assessment & Plan:   HPI/Patient Profile:   68 y.o. female  with past medical history of CKD IV, hypokalemia, psoriasis, depression admitted on 03/13/2024 with AKI on CKD, severe protein calorie malnutrition, and acute metabolic encephalopathy. Patient most recently admitted 10/18-10/25 for a similar presentation with syncope and generalized weakness.   Palliative medicine consulted for goals of care conversation.    SUMMARY OF RECOMMENDATIONS Continue full code, full scope, had a frank discussion with niece [name]) about futility of continued interventions and risks vs benefits Family agreeable to Coretrak for nutrition, aware of risks and benefits, anticipatory guidance provided for continue deterioration in spite of artificial feeding tube and possible permanent PEG  tube  Symptom Management:  Per primary team  Code Status: Full Code  Prognosis: < 6 months  Discharge Planning: To Be Determined  Discussed with: [name] DO about family wishes to pursue coretrak at this time and will consider their options going forward if patient continues to deteriorate.   Subjective:   Subjective: Chart Reviewed. Updates received. Patient Assessed. Created space and opportunity for patient  and family to explore thoughts and feelings regarding current medical situation.  Today's Discussion: Today before meeting with the patient/family, I reviewed the chart notes including progress note on 11/20 by [facility] DO. I also reviewed vital signs, nursing flowsheets, medication administrations record, labs, and imaging.  Multiple attempts made to reach [name] and Andrews to discuss goals of care throughout the day.   Patient continues to receive dextrose  infusion for continued low blood glucose readings 2/2 poor PO intake.   Patient remains pleasantly confused and discussed with her the option of a nasogastric feeding tube. Patient was quick to respond that she would not want a feeding tube, even a temporary one.  Discussed with partner and joined by niece [name]) on the phone to discuss pathways including continued aggressive interventions including coretrak in an attempt to prolong life vs shifting focus to comfort measures. Reviewed with family that the patient is currently receiving dextrose  infusion for continued low blood glucose. Niece and partner would like to pursue Coretrak with all the risks and  benefits it entails including increased diarrhea that would lead to increased pressure ulcers, increased patient agitation, and poor likelihood of significant recovery. Discussed that it is reasonable to be hopeful of a recovery but that it is important to remain realistic and to expect continued deterioration. Family aware of the possible negative outcomes and are agreeable to  continued discussions when the time comes to consider end of life measures. Family seemed clear that if the need for a more permanent feeding tube such as a PEG tube were to be needed for prolonged or permanent nutrition, they would probably not want that for the patient.   Review of Systems  Constitutional:  Positive for appetite change.    Objective:   Primary Diagnoses: Present on Admission:  Syncope  Gastroesophageal reflux disease  CKD (chronic kidney disease) stage 3, GFR 30-59 ml/min (HCC)  Protein-calorie malnutrition, severe  Acute renal failure  Chronic diarrhea   Vital Signs:  BP (!) 141/89 (BP Location: Right Arm)   Pulse 62   Temp 97.8 F (36.6 C) (Oral)   Resp 15   Ht 5' (1.524 m)   Wt 32.7 kg   SpO2 91%   BMI 14.06 kg/m   Physical Exam HENT:     Head: Normocephalic.     Nose: Nose normal.     Mouth/Throat:     Mouth: Mucous membranes are dry.  Eyes:     Extraocular Movements: Extraocular movements intact.  Cardiovascular:     Rate and Rhythm: Normal rate.     Pulses: Normal pulses.  Pulmonary:     Effort: Pulmonary effort is normal.  Abdominal:     General: Abdomen is flat.  Musculoskeletal:     Right lower leg: No edema.     Left lower leg: No edema.  Neurological:     Mental Status: She is alert. She is disoriented.  Psychiatric:     Comments: Poor insight on health.      Palliative Assessment/Data: 20%   Existing Vynca/ACP Documentation: None  Thank you for allowing us  to participate in the care of JANIN KOZLOWSKI PMT will continue to support holistically.  I personally spent a total of 50 minutes in the care of the patient today including preparing to see the patient, getting/reviewing separately obtained history, performing a medically appropriate exam/evaluation, counseling and educating, referring and communicating with other health care professionals, and documenting clinical information in the EHR.   [name]   Palliative Medicine Team  Team Phone # [phone] (Nights/Weekends) 03/22/2024 4:35 PM

## 2024-03-22 NOTE — Plan of Care (Signed)

## 2024-03-22 NOTE — Progress Notes (Signed)
 This encounter was created in error - please disregard.

## 2024-03-23 DIAGNOSIS — E43 Unspecified severe protein-calorie malnutrition: Secondary | ICD-10-CM | POA: Diagnosis not present

## 2024-03-23 DIAGNOSIS — R55 Syncope and collapse: Secondary | ICD-10-CM | POA: Diagnosis not present

## 2024-03-23 DIAGNOSIS — Z7189 Other specified counseling: Secondary | ICD-10-CM | POA: Diagnosis not present

## 2024-03-23 DIAGNOSIS — Z515 Encounter for palliative care: Secondary | ICD-10-CM | POA: Diagnosis not present

## 2024-03-23 DIAGNOSIS — R627 Adult failure to thrive: Secondary | ICD-10-CM | POA: Diagnosis not present

## 2024-03-23 DIAGNOSIS — K229 Disease of esophagus, unspecified: Secondary | ICD-10-CM | POA: Diagnosis not present

## 2024-03-23 DIAGNOSIS — Z681 Body mass index (BMI) 19 or less, adult: Secondary | ICD-10-CM | POA: Diagnosis not present

## 2024-03-23 LAB — CBC
HCT: 28.7 % — ABNORMAL LOW (ref 36.0–46.0)
Hemoglobin: 9 g/dL — ABNORMAL LOW (ref 12.0–15.0)
MCH: 29.9 pg (ref 26.0–34.0)
MCHC: 31.4 g/dL (ref 30.0–36.0)
MCV: 95.3 fL (ref 80.0–100.0)
Platelets: 228 K/uL (ref 150–400)
RBC: 3.01 MIL/uL — ABNORMAL LOW (ref 3.87–5.11)
RDW: 17.8 % — ABNORMAL HIGH (ref 11.5–15.5)
WBC: 5.6 K/uL (ref 4.0–10.5)
nRBC: 0 % (ref 0.0–0.2)

## 2024-03-23 LAB — BASIC METABOLIC PANEL WITH GFR
Anion gap: 8 (ref 5–15)
BUN: 28 mg/dL — ABNORMAL HIGH (ref 8–23)
CO2: 27 mmol/L (ref 22–32)
Calcium: 7.6 mg/dL — ABNORMAL LOW (ref 8.9–10.3)
Chloride: 101 mmol/L (ref 98–111)
Creatinine, Ser: 1.67 mg/dL — ABNORMAL HIGH (ref 0.44–1.00)
GFR, Estimated: 33 mL/min — ABNORMAL LOW (ref >60)
Glucose, Bld: 194 mg/dL — ABNORMAL HIGH (ref 70–99)
Potassium: 3.9 mmol/L (ref 3.5–5.1)
Sodium: 136 mmol/L (ref 135–145)

## 2024-03-23 LAB — GLUCOSE, CAPILLARY
Glucose-Capillary: 44 mg/dL — CL (ref 70–99)
Glucose-Capillary: 131 mg/dL — ABNORMAL HIGH (ref 70–99)
Glucose-Capillary: 55 mg/dL — ABNORMAL LOW (ref 70–99)
Glucose-Capillary: 103 mg/dL — ABNORMAL HIGH (ref 70–99)
Glucose-Capillary: 159 mg/dL — ABNORMAL HIGH (ref 70–99)
Glucose-Capillary: 91 mg/dL (ref 70–99)
Glucose-Capillary: 127 mg/dL — ABNORMAL HIGH (ref 70–99)

## 2024-03-23 MED ORDER — DEXTROSE-SODIUM CHLORIDE 5-0.45 % IV SOLN: INTRAVENOUS | Status: AC

## 2024-03-23 MED ORDER — DEXTROSE 50 % IV SOLN
1.0000 | Freq: Once | INTRAVENOUS | Status: AC
  Administered 2024-03-24: 50 mL via INTRAVENOUS
  Filled 2024-03-23: qty 50

## 2024-03-23 MED ORDER — DEXTROSE 50 % IV SOLN
INTRAVENOUS | Status: AC
  Administered 2024-03-23: 50 mL via INTRAVENOUS
  Filled 2024-03-23: qty 50

## 2024-03-23 MED ORDER — DEXTROSE 50 % IV SOLN: 1.0000 | Freq: Once | INTRAVENOUS | Status: DC

## 2024-03-23 MED ORDER — DEXTROSE-SODIUM CHLORIDE 10-0.45 % IV SOLN
INTRAVENOUS | Status: DC
  Filled 2024-03-23: qty 1000

## 2024-03-23 NOTE — Plan of Care (Signed)
  Problem: Pain Managment: Goal: General experience of comfort will improve and/or be controlled Outcome: Progressing   Problem: Safety: Goal: Ability to remain free from injury will improve Outcome: Progressing   Problem: Skin Integrity: Goal: Risk for impaired skin integrity will decrease Outcome: Progressing

## 2024-03-23 NOTE — IPAL (Signed)
 Goals of Care Conversation  Medical Update [phone] woman with CKDIV, subacute diarrhea, suspected dementia, and recent admission for bronchopneumonia, AKI, and metabolic acidosis, who presented with failure to thrive  and weight loss at home.    As background, she was initially admitted from 10/18-10/25/25. She presented then with weeks of poor oral intake, diarrhea, and weight loss, also with acute fever, cough, and chills. She was diagnosed with pneumonia, AKI, and a metabolic acidosis, and treated with fluids and antibiotics. She was discharged on 10/25 with Mirtazapine  for appetite stimulation and with potassium supplements.    She saw PCP on 03/07/24, at which time she was still having diarrhea. Labs showed Cr 3.69 (from 1.96) and HCO3 12, so she was advised to go to the ER.   She has unfortunately not done well at home since her October admission.  Here, we consulted GI. She did not tolerate colonoscopy prep, but EGD showed esophageal plaques suspicious for candidiasis, so we started fluconazole . Her throat pain has improved since starting fluconazole , but still she is not eating.  She has severe protein calorie malnutrition. She has very little appetite. She is deficient in multiple vitamins. Her blood glucose levels have been low unless supplemented with IV dextrose  regularly.   RD team is recommending tube feeds for nutrition   Conversation Ms Stockley does not have the capacity to make complex medical decisions, but she is still consistent about her values, so she absolutely has a voice in her healthcare. Her adopted daughter [name] is her next of kin and who she would like assisting her in making complex medical decisions. Ms Timothy' partner is very supportive and at bedside, but is not HCPOA.   Today, I explained the medical update to Ms Zaragoza. I outlined the 2 paths we have moving forward: Stop focusing on the labs, blood sugar levels, etc and instead focus on her comfort and quality of  life. Let her eat what she wants. Start trying to get her out of the hospital safely with a plan that focuses on quality of life.  Continue aggressive medical therapy to replete her nutrients and keep blood sugar levels in the normal range - this would first involve placing a Coretrack tube, and if tube feeds went well, would then involve PEG tube placement.   To these choices, Ms Herne replied I don't want pain and You aren't putting any tube in my nose, my eyes, or my mouth. Prior conversations have demonstrated the she values independence.   Assessment and Plan My assessment is that Ms Leonor' stated values and goals line up more closely with Option 1 - a more palliative approach to care, and that she does not consent to feeding tube placement.   Plan  Do not place Coretrack today without her consent While she is still full code and full scope of care, give D10 today to treat hypoglycemia and continue IV vitamins and medicines  We have a call out to [name] to arrange a conversation with our team (and/or palliative care team), the patient, and [name] to make a final decision on if feeding tube is within her goals of care or if we should move towards a more comfort focused approach

## 2024-03-23 NOTE — Progress Notes (Signed)
 Daily Progress Note   Date: 03/23/2024   Patient Name: Whitney Parrish  DOB: 02-18-1956  MRN: [id number]  Age / Sex: 68 y.o., female  Attending Physician: Lovie Clarity, MD Primary Care Physician: Phyllis Jereld BROCKS, NP Admit Date: 03/13/2024 Length of Stay: 9 days  Reason for Follow-up: Establishing goals of care  Past Medical History:  Diagnosis Date   Anxiety    Arthritis    shoulders and hands   Asthma    Benign positional vertigo    Chronically dry eyes    CKD (chronic kidney disease), stage III (HCC)    Depression    Dyspnea on exertion    GERD (gastroesophageal reflux disease)    Hematuria    History of acute pyelonephritis    History of Bell's palsy    1980's left side-- residual lip numbness intermittant   History of chest pain    non-cardiac per dr claudene notes (pt's pcp)   History of panic attacks    Hypertension    Left ureteral stone    Loose bowel movements    chronic-per pt residual from injury during abdominal surgery in 1986   Migraines    Nephrolithiasis    left    Psoriasis    Urgency of urination    Wears dentures    has upper and lower but only wears upper    Assessment & Plan:   HPI/Patient Profile:  68 y.o. female  with past medical history of CKD IV, hypokalemia, psoriasis, depression admitted on 03/13/2024 with AKI on CKD, severe protein calorie malnutrition, and acute metabolic encephalopathy. Patient most recently admitted 10/18-10/25 for a similar presentation with syncope and generalized weakness.   Palliative medicine consulted for goals of care conversation.   SUMMARY OF RECOMMENDATIONS  Continue full code, full scope, had a frank discussion with niece [name]) about futility of continued interventions and risks vs benefits Coretrak attempted to be placed but patient refused, continue GOC with [name], see IPAL note by Lovie MD on 11/21 Continue to encourage comfort measures with [name]  Symptom Management:  Per  primary team  Code Status: Full Code  Prognosis: < 6 months  Discharge Planning: To Be Determined  Discussed with: [name] DO about continuing to follow along for GOC.   Subjective:   Subjective: Chart Reviewed. Updates received. Patient Assessed. Created space and opportunity for patient  and family to explore thoughts and feelings regarding current medical situation.  Today's Discussion: Visited patient bedside with [name] present. Spoke with [name] about next steps after the patient refused a coretrak. Encouraged him to talk with [facility] about comfort measures going forward. Spoke with patient and reiterated the seriousness of the situation but continues to show limited insight. Shared with her that she may be at the end of her life given her poor PO intake requiring IV dextrose  to raise her blood glucose and she replied oh I dontt want that. Inquired her about considering transitioning to hospice since she refused coretrak placement and she also did not agree to going home with hospice.   Review of Systems  Constitutional:  Positive for appetite change.    Objective:   Primary Diagnoses: Present on Admission:  Syncope  Gastroesophageal reflux disease  CKD (chronic kidney disease) stage 3, GFR 30-59 ml/min (HCC)  Protein-calorie malnutrition, severe  Acute renal failure  Chronic diarrhea   Vital Signs:  BP (!) 143/97 (BP Location: Right Arm)   Pulse 74   Temp 98.2 F (36.8 C) (Oral)  Resp 16   Ht 5' (1.524 m)   Wt 32.7 kg   SpO2 100%   BMI 14.06 kg/m   Physical Exam Constitutional:      Appearance: She is ill-appearing.  HENT:     Head: Normocephalic.     Nose: Nose normal.     Mouth/Throat:     Mouth: Mucous membranes are dry.  Eyes:     Extraocular Movements: Extraocular movements intact.  Cardiovascular:     Rate and Rhythm: Normal rate.  Pulmonary:     Effort: Pulmonary effort is normal.  Abdominal:     General: Abdomen is flat.  Neurological:      Mental Status: She is alert. She is disoriented.  Psychiatric:     Comments: Limited insight on health    Palliative Assessment/Data: 20%   Existing Vynca/ACP Documentation: None  Thank you for allowing us  to participate in the care of BRALYN ESPINO PMT will continue to support holistically.  I personally spent a total of 35 minutes in the care of the patient today including preparing to see the patient, performing a medically appropriate exam/evaluation, counseling and educating, referring and communicating with other health care professionals, and documenting clinical information in the EHR.  [name]  Palliative Medicine Team  Team Phone # [phone] (Nights/Weekends) 03/23/2024 5:20 PM

## 2024-03-23 NOTE — Progress Notes (Signed)
 Blood sugar was 44. Md made aware thru page. New orders made.Rechecked sugar at 0624 it was 134.kept watched.

## 2024-03-23 NOTE — Progress Notes (Signed)
 HD#9 SUBJECTIVE:  Patient Summary: Whitney Parrish is a 68 y.o. with a pertinent PMH of CKD stage III, chronic diarrhea, and GERD, who presented with a syncopal episode and admitted for syncope workup and failure to thrive .   Overnight Events: The patient had multiple episodes of hypoglycemia and required IV dextrose .   Interim History: The patient was seen and examined at the bedside this morning.  She was resting comfortably and pleasantly interactive with the team.  OBJECTIVE:  Vital Signs: Vitals:   03/22/24 1732 03/22/24 2128 03/23/24 0424 03/23/24 1022  BP: (!) 145/74 (!) 159/93 107/62 126/80  Pulse: 67 64 65 64  Resp: 14 17 16 17   Temp: 98.5 F (36.9 C) 98.4 F (36.9 C) 98.5 F (36.9 C) 98.9 F (37.2 C)  TempSrc:  Oral Oral Oral  SpO2: 100% 100% 100% 100%  Weight:      Height:       Supplemental O2: Room Air SpO2: 100 % O2 Flow Rate (L/min): 3 L/min  Filed Weights   03/13/24 0800  Weight: 32.7 kg     Intake/Output Summary (Last 24 hours) at 03/23/2024 1115 Last data filed at 03/23/2024 0150 Gross per 24 hour  Intake 2474.02 ml  Output --  Net 2474.02 ml   Net IO Since Admission: 7,954.77 mL [03/23/24 1115]  Physical Exam: Const: Frail appearing woman in no acute distress sitting up in chair HENT: Normocephalic, atraumatic, mucus membranes moist Card: RRR, No MRG, No pitting edema on LE's bilaterally  Resp: LCTAB, no increased work of breathing Abd: Scaphoid abdomen, nontender to palpation. Extremities: Warm, dry, cachetic   Patient Lines/Drains/Airways Status     Active Line/Drains/Airways     Name Placement date Placement time Site Days   Peripheral IV 03/14/24 22 G 1 Posterior;Right Forearm 03/14/24  2029  Forearm  5   Wound 03/13/24 1739 Pressure Injury Buttocks Right Stage 2 -  Partial thickness loss of dermis presenting as a shallow open injury with a red, pink wound bed without slough. 03/13/24  1739  Buttocks  6   Wound 03/14/24 Irritant  Contact Dermatitis Buttocks Medial 03/14/24  --  Buttocks  5            Pertinent labs and imaging:     Latest Ref Rng & Units 03/23/2024    5:13 AM 03/22/2024    4:48 AM 03/21/2024    8:29 AM  CBC  WBC 4.0 - 10.5 K/uL 5.6  6.6  9.8   Hemoglobin 12.0 - 15.0 g/dL 9.0  9.0  89.6   Hematocrit 36.0 - 46.0 % 28.7  27.8  32.5   Platelets 150 - 400 K/uL 228  194  201        Latest Ref Rng & Units 03/23/2024    5:13 AM 03/22/2024    4:48 AM 03/21/2024    5:26 AM  CMP  Glucose 70 - 99 mg/dL 805  78  65   BUN 8 - 23 mg/dL 28  28  36   Creatinine 0.44 - 1.00 mg/dL 8.32  8.41  7.98   Sodium 135 - 145 mmol/L 136  139  141   Potassium 3.5 - 5.1 mmol/L 3.9  4.3  4.7   Chloride 98 - 111 mmol/L 101  101  109   CO2 22 - 32 mmol/L 27  27  19    Calcium  8.9 - 10.3 mg/dL 7.6  6.7  8.4     No results found.  ASSESSMENT/PLAN:  Assessment: Principal Problem:   Syncope Active Problems:   Dizziness   Gastroesophageal reflux disease   CKD (chronic kidney disease) stage 3, GFR 30-59 ml/min (HCC)   Protein-calorie malnutrition, severe   Acute renal failure   Chronic diarrhea   Weakness generalized   Pressure injury of skin   AKI (acute kidney injury)   Failure to thrive  in adult   Hypoalbuminemia   Pancreatic insufficiency   Stage 4 chronic kidney disease (HCC)   Macrocytic anemia   Candidiasis, esophageal (HCC)   Chronic kidney disease (CKD), stage IV (severe) (HCC)   Diarrhea due to malabsorption   Goals of care, counseling/discussion   Severe protein-calorie malnutrition   Decreased appetite   Plan: #Failure to thrive  #Severe malnutrition - BMI 14.06 - The patient continues to endorse very limited appetite and reports that he just does not feel like eating - At this point the patient is continuously declining oral intake including nutritional supplements.  She is remaining persistently hypoglycemic at last receiving IV dextrose  due to her severely decreased physiologic  reserve. In order to sustain her nutrition, she will require supplemental tube feeding.  I do not feel like in the long run this is a good option for her as she is exhibiting a failure to thrive  picture.  The patient is also adamantly refused tube placement and repeatedly reports that she does not want to be in pain. - This was discussed with the family yesterday who did elect to proceed with a trial with cortrak. When attempting to place core track, the patient adamantly refused and said that she did not want any tubes and had not been spoken to about feeding tube placement. -A long conversation was had (see Dr. Evetta note) and the patient continuously repeats that she does not want to be in any pain and she does not want tubes being inserted to her body.  At this point we will not pursue core track placement as patient has blatantly stated her values and what she is comfortable with. - Continue D10 half-normal saline today to maintain normoglycemia.  #Esophageal candidiasis   - EGD performed on 11/15 with suspected esophageal candidiasis - The patient denies pain at this point and is continuing to exhibit poor oral intake. - Continue IV fluconazole  through 12/2. - Continue viscous lidocaine  for symptomatic relief.  #Chronic diarrhea #Pancreatic insufficiency  -The patient was admitted on 11/11 for a syncopal episode.  Suspect this is secondary to severe malnutrition and chronic diarrhea. - GI has signed off this admission - Awaiting repeat fecal elastase however this has not been collected as patient's bowel movements have slowed down secondary to Imodium   #UTI -UA obtained and indicative of UTI.  Patient was reporting suprapubic pain. -Rocephin  initiated. Day 3/3  #AKI Resolved  #Anemia -Patient with a history of chronic anemia secondary to CKD. -Baseline 7-8 -s/p 1unit PRBC 11/17 with improvement in Hgb. Continue to trend CBC's  #Goals of Care - Conversations continue to be had  with the family, palliative, and the medicine team.  The patient does not have capacity to make healthcare decisions however she is still able to verbalize what she does and does not desire.  That a conversation was had and she does not desire a feeding tube or to be in any pain.  At this point although family wish for core track to be placed, will not force or restrain patient in order to do so as she is adamantly refusing. -Will highly encourage the healthcare  power of attorney and next of kin, [name]-her niece, to come to the hospital for an in person decision.  At this point we are in a holding pattern until a decision about further care can be made. -See Dr. Evetta note from 11/21   Best Practice: Diet: Regular diet IVF: Fluids: none, Rate: None VTE: Place and maintain sequential compression device Start: 03/21/24 1144Pt refused injections and is unable to swallow pills--SCD's ordered.  Code: Full  Disposition planning: Therapy Recs: Pending, DME: other pending Family Contact: [name] Fontana, to be notified. DISPO: Pending  Signature:  Schuyler Novak, DO [facility]  11:15 AM, 03/23/2024  On Call pager [phone]

## 2024-03-23 NOTE — Plan of Care (Signed)
  Problem: Pain Managment: Goal: General experience of comfort will improve and/or be controlled Outcome: Progressing   Problem: Safety: Goal: Ability to remain free from injury will improve Outcome: Progressing   Problem: Clinical Measurements: Goal: Will remain free from infection Outcome: Progressing   Problem: Skin Integrity: Goal: Risk for impaired skin integrity will decrease Outcome: Progressing   Problem: Clinical Measurements: Goal: Will remain free from infection Outcome: Progressing   Problem: Pain Managment: Goal: General experience of comfort will improve and/or be controlled 03/23/2024 0030 by Fredda Honor Leash, RN Outcome: Progressing 03/23/2024 0029 by Fredda Honor Leash, RN Outcome: Progressing   Problem: Safety: Goal: Ability to remain free from injury will improve 03/23/2024 0030 by Fredda Honor Leash, RN Outcome: Progressing 03/23/2024 0029 by Fredda Honor Leash, RN Outcome: Progressing

## 2024-03-23 NOTE — Progress Notes (Addendum)
 Nutrition Brief Note  Pt resting in bed, still pleasantly confused. Pt reports she is eating good. Breakfast tray at bedside not touched. Pt reports she did not eat breakfast today because she had a big dinner yesterday however, pt only had 25% of a sandwich. Pt continues to refuse supplements. Has no desire to eat, no appetite. Family meeting yesterday with palliative regarding GOC, family would like to pursue cortrak placement to se if with time pt could improve. Pt and family would not want long term feeding tube such as a PEG. Cortrak team attempted to place feeding tube however pt adamantly refused. Notified MD. Continue current nutritional interventions, follow along for GOC.   INTERVENTION:  Encourage PO intake - On Dysphagia 3, thin liquids  Per pt's preference transition to dysphagia 3 diet to help with ease of intake Room service with assist Nursing to assist with meals Ensure Plus High Protein po BID, each supplement provides 350 kcal and 20 grams of protein Mighty Shake TID with meals, each supplement provides 330 kcals and 9 grams of protein 1 packet Juven BID, each packet provides 95 calories, 2.5 grams of protein (collagen), to support wound healing MVI with minerals daily 100 mg Thiamine  daily Continue Vitamin A   and Vitamin D  for noted deficiency  500 mg Vitamin C  x 30 days and 220 mg Zinc  x 15 days for wound healing  Monitor magnesium , potassium, and phosphorus daily, MD to replete as needed, as pt is at risk for refeeding syndrome given severe malnutrition    NUTRITION DIAGNOSIS:   Severe Malnutrition related to chronic illness as evidenced by severe muscle depletion, severe fat depletion, energy intake < or equal to 75% for > or equal to 1 month, percent weight loss (In the last year pt has lost 23 lbs, 24%.). - Ongoing    GOAL:    Patient will meet greater than or equal to 90% of their needs, Weight gain - Progressing   Olivia Kenning, RD Registered Dietitian  See  Amion for more information

## 2024-03-23 NOTE — Progress Notes (Signed)
 Hypoglycemic Event  CBG: 55  Treatment: 4 oz orange juice  Symptoms: None  Follow-up CBG: Time:1040 CBG Result:103  Possible Reasons for Event: Inadequate meal intak  Comments/MD notified: care team at bedside and CN, Yolande Daring, RN notified.     Darryle Fischer

## 2024-03-23 NOTE — Progress Notes (Signed)
 Physical Therapy Treatment Patient Details Name: Whitney Parrish MRN: [id number] DOB: Feb 11, 1956 Today's Date: 03/23/2024   History of Present Illness Pt is a 68 y.o. female presenting 03/13/24 with syncope and generalized weakness for the past 2 weeks. Workup for failure to thrive , worsening chronic diarrhea, NAGMA, and acute metabolic encephalopathy. Pt underwent Upper GI endoscopy 11/15 which demonstrated esophageal plaques suspicious for candidiasis, erosive gastropathy, and 3 duodenal polyps.  PMH: CKD4, hypokalemia, asthma, GERD, psoriasis, depression, and insomnia.  Of note, recent hospitalization 10/18-10/25 for syncopal episode found to have severe hypokalemia secondary to chronic diarrhea    PT Comments  Pt greeted supine in bed, pleasant and agreeable to PT session. Treatment focused on gait training with emphasis on increasing pt's safety awareness and improving balance. She continues to demonstrated multiple gait abnormalities and has a tendency to veer to the left while advancing the RW. Pt required minA for safety and stability during ambulation. Assist to aid in RW maneuverability as well as proximity to AD for improved support. As pt fatigues, she becomes more easily distracted and benefits from step-by-step cues for sequencing. Pt is considered a high fall risk and will need hands-on assist for all mobility using RW. Pt will benefit from acute skilled PT to maximize her independence and safety with mobility to allow discharge with HHPT.      If plan is discharge home, recommend the following: A little help with walking and/or transfers;A lot of help with bathing/dressing/bathroom;Assistance with cooking/housework;Assist for transportation;Help with stairs or ramp for entrance;Supervision due to cognitive status   Can travel by private vehicle        Equipment Recommendations  Rolling walker (2 wheels);BSC/3in1;Other (comment) (transport chair)    Recommendations for Other  Services       Precautions / Restrictions Precautions Precautions: Fall Recall of Precautions/Restrictions: Impaired Restrictions Weight Bearing Restrictions Per Provider Order: No     Mobility  Bed Mobility Overal bed mobility: Needs Assistance Bed Mobility: Supine to Sit     Supine to sit: HOB elevated, Used rails, Contact guard     General bed mobility comments: Pt sat up on R side of bed with increased time. She slowly brought BLE off EOB, elevated trunk, and scooted fwd with use of bedrail.    Transfers Overall transfer level: Needs assistance Equipment used: Rolling walker (2 wheels) Transfers: Sit to/from Stand, Bed to chair/wheelchair/BSC Sit to Stand: Contact guard assist   Step pivot transfers: Min assist       General transfer comment: Pt stood from lowest bed height. Cues for proper hand placement using RW. Powered up with light assist. Transferred to recliner chair. Fair eccentric control. Pt had difficulty scooting hips back, repositioned with +2 assist and aid of bed pad.    Ambulation/Gait Ambulation/Gait assistance: Min assist, +2 safety/equipment (chair follow) Gait Distance (Feet): 75 Feet (x2, seated rest break) Assistive device: Rolling walker (2 wheels) Gait Pattern/deviations: Step-to pattern, Decreased step length - right, Decreased step length - left, Decreased stride length, Narrow base of support, Trunk flexed, Drifts right/left Gait velocity: decreased Gait velocity interpretation: <1.31 ft/sec, indicative of household ambulator   General Gait Details: Pt takes short slow steps with a very NBOS. She has a tendency to fixate gaze on the ground and veers to the L while advancing RW. Assist for stability and to improve manuevering of RW. Cues for forward gaze, increased BOS to shoulder width apart, increase step length, and greater foot clearence. Pt took seated rest break between  bouts. No LOB, but unsteady requiring hands-on assist for  safety.   Stairs             Wheelchair Mobility     Tilt Bed    Modified Rankin (Stroke Patients Only)       Balance Overall balance assessment: Needs assistance Sitting-balance support: No upper extremity supported, Feet supported Sitting balance-Leahy Scale: Fair     Standing balance support: Bilateral upper extremity supported, During functional activity, Reliant on assistive device for balance Standing balance-Leahy Scale: Poor Standing balance comment: Pt dependent on RW                            Communication Communication Communication: No apparent difficulties  Cognition Arousal: Alert Behavior During Therapy: WFL for tasks assessed/performed   PT - Cognitive impairments: Orientation, Awareness, Memory, Sequencing, Problem solving, Safety/Judgement, Attention   Orientation impairments: Situation                     Following commands: Impaired Following commands impaired: Follows one step commands with increased time, Follows one step commands inconsistently    Cueing Cueing Techniques: Verbal cues, Tactile cues, Visual cues, Gestural cues  Exercises      General Comments        Pertinent Vitals/Pain Pain Assessment Pain Assessment: Faces Faces Pain Scale: No hurt Pain Intervention(s): Monitored during session, Repositioned    Home Living                          Prior Function            PT Goals (current goals can now be found in the care plan section) Acute Rehab PT Goals Patient Stated Goal: Return Home PT Goal Formulation: With family Time For Goal Achievement: 03/28/24 Potential to Achieve Goals: Fair Progress towards PT goals: Progressing toward goals    Frequency    Min 2X/week      PT Plan      Co-evaluation              AM-PAC PT 6 Clicks Mobility   Outcome Measure  Help needed turning from your back to your side while in a flat bed without using bedrails?: A Little Help  needed moving from lying on your back to sitting on the side of a flat bed without using bedrails?: A Little Help needed moving to and from a bed to a chair (including a wheelchair)?: A Little Help needed standing up from a chair using your arms (e.g., wheelchair or bedside chair)?: A Little Help needed to walk in hospital room?: A Little Help needed climbing 3-5 steps with a railing? : A Lot 6 Click Score: 17    End of Session Equipment Utilized During Treatment: Gait belt Activity Tolerance: Patient tolerated treatment well;No increased pain Patient left: in chair;with call bell/phone within reach;with chair alarm set;with family/visitor present Nurse Communication: Mobility status PT Visit Diagnosis: Adult, failure to thrive  (R62.7);Muscle weakness (generalized) (M62.81);Difficulty in walking, not elsewhere classified (R26.2);Unsteadiness on feet (R26.81)     Time: 8588-8567 PT Time Calculation (min) (ACUTE ONLY): 21 min  Charges:    $Gait Training: 8-22 mins PT General Charges $$ ACUTE PT VISIT: 1 Visit                     Randall SAUNDERS, PT, DPT Acute Rehabilitation Services Office: [phone] Secure Chat Preferred  Delon  CHRISTELLA Callander 03/23/2024, 3:17 PM

## 2024-03-23 NOTE — Progress Notes (Signed)
 Notified by Laymon Coventry, RN that the PIV placement was unsuccessfully per RN request to re-consult IV team for IV placement with US .

## 2024-03-23 NOTE — Progress Notes (Addendum)
 Patient in bed at lowest position, bed alarm on and floor mat down. MD came to bedside to exam patient. Reinforced education with patient to press call light for assistance and not to attempt to get out of bed alone. Patient said, I won't get up.

## 2024-03-23 NOTE — Plan of Care (Signed)
  Problem: Coping: Goal: Level of anxiety will decrease Outcome: Progressing   Problem: Elimination: Goal: Will not experience complications related to bowel motility Outcome: Progressing Goal: Will not experience complications related to urinary retention Outcome: Progressing   Problem: Pain Managment: Goal: General experience of comfort will improve and/or be controlled Outcome: Progressing

## 2024-03-24 DIAGNOSIS — Z681 Body mass index (BMI) 19 or less, adult: Secondary | ICD-10-CM | POA: Diagnosis not present

## 2024-03-24 DIAGNOSIS — N189 Chronic kidney disease, unspecified: Secondary | ICD-10-CM

## 2024-03-24 DIAGNOSIS — E43 Unspecified severe protein-calorie malnutrition: Secondary | ICD-10-CM | POA: Diagnosis not present

## 2024-03-24 DIAGNOSIS — N179 Acute kidney failure, unspecified: Secondary | ICD-10-CM | POA: Diagnosis not present

## 2024-03-24 DIAGNOSIS — Z79899 Other long term (current) drug therapy: Secondary | ICD-10-CM

## 2024-03-24 DIAGNOSIS — R627 Adult failure to thrive: Secondary | ICD-10-CM | POA: Diagnosis not present

## 2024-03-24 LAB — BASIC METABOLIC PANEL WITH GFR
Anion gap: 12 (ref 5–15)
BUN: 21 mg/dL (ref 8–23)
CO2: 24 mmol/L (ref 22–32)
Calcium: 8 mg/dL — ABNORMAL LOW (ref 8.9–10.3)
Chloride: 105 mmol/L (ref 98–111)
Creatinine, Ser: 1.63 mg/dL — ABNORMAL HIGH (ref 0.44–1.00)
GFR, Estimated: 34 mL/min — ABNORMAL LOW (ref >60)
Glucose, Bld: 69 mg/dL — ABNORMAL LOW (ref 70–99)
Potassium: 4.3 mmol/L (ref 3.5–5.1)
Sodium: 141 mmol/L (ref 135–145)

## 2024-03-24 LAB — CBC
HCT: 26.8 % — ABNORMAL LOW (ref 36.0–46.0)
Hemoglobin: 8.6 g/dL — ABNORMAL LOW (ref 12.0–15.0)
MCH: 30.4 pg (ref 26.0–34.0)
MCHC: 32.1 g/dL (ref 30.0–36.0)
MCV: 94.7 fL (ref 80.0–100.0)
Platelets: 237 K/uL (ref 150–400)
RBC: 2.83 MIL/uL — ABNORMAL LOW (ref 3.87–5.11)
RDW: 17.5 % — ABNORMAL HIGH (ref 11.5–15.5)
WBC: 6.5 K/uL (ref 4.0–10.5)
nRBC: 0 % (ref 0.0–0.2)

## 2024-03-24 LAB — GLUCOSE, CAPILLARY
Glucose-Capillary: 110 mg/dL — ABNORMAL HIGH (ref 70–99)
Glucose-Capillary: 117 mg/dL — ABNORMAL HIGH (ref 70–99)
Glucose-Capillary: 122 mg/dL — ABNORMAL HIGH (ref 70–99)
Glucose-Capillary: 59 mg/dL — ABNORMAL LOW (ref 70–99)
Glucose-Capillary: 71 mg/dL (ref 70–99)
Glucose-Capillary: 73 mg/dL (ref 70–99)
Glucose-Capillary: 77 mg/dL (ref 70–99)

## 2024-03-24 LAB — PHOSPHORUS: Phosphorus: 2.5 mg/dL (ref 2.5–4.6)

## 2024-03-24 LAB — MAGNESIUM: Magnesium: 1.4 mg/dL — ABNORMAL LOW (ref 1.7–2.4)

## 2024-03-24 NOTE — Progress Notes (Signed)
 IV team consulted for IV insertion with US .Patient refused per IV nurse said  she will call and have it done before she goes to bed.

## 2024-03-24 NOTE — Progress Notes (Signed)
 Mobility Specialist Progress Note:    03/24/24 1250  Mobility  Activity Dangled on edge of bed (Ankle Pumps, Leg Ext, sitting Marches)  Level of Assistance Standby assist, set-up cues, supervision of patient - no hands on  Assistive Device None  Range of Motion/Exercises Active Assistive  Activity Response Tolerated fair;Other (Comment) (Pt seemed slightly confused during session.)  Mobility Referral Yes  Mobility visit 1 Mobility  Mobility Specialist Start Time (ACUTE ONLY) 1250  Mobility Specialist Stop Time (ACUTE ONLY) 1256  Mobility Specialist Time Calculation (min) (ACUTE ONLY) 6 min   Received pt sitting on EOB agreeable to session. Pt w/ no c/o any symptoms but pt seemed slightly confused throughout session. Pt once shown a demonstration able to perform movements decently. Pt afraid to try ambulation currently. Left pt on EOB w/ all needs met.   Venetia Keel Mobility Specialist Please Neurosurgeon or Rehab Office at [phone]

## 2024-03-24 NOTE — Plan of Care (Signed)
   Problem: Education: Goal: Knowledge of General Education information will improve Description: Including pain rating scale, medication(s)/side effects and non-pharmacologic comfort measures Outcome: Progressing   Problem: Clinical Measurements: Goal: Ability to maintain clinical measurements within normal limits will improve Outcome: Progressing   Problem: Nutrition: Goal: Adequate nutrition will be maintained Outcome: Progressing

## 2024-03-24 NOTE — Progress Notes (Addendum)
 HD#10 SUBJECTIVE:  Patient Summary: Whitney Parrish is a 68 y.o. with a pertinent PMH of CKD stage III, chronic diarrhea, and GERD, who presented with a syncopal episode and admitted for syncope workup and failure to thrive .   Overnight Events: No episodes of hypoglycemia overnight; has been on D5 half-normal saline for the past 24 hrs.   Interim History: Patient seen at bedside, she says her appetite is better. Yesterday palliative met with patient's family for ongoing GOC; family pursuing full code, full scope.  OBJECTIVE:  Vital Signs: Vitals:   03/23/24 1751 03/23/24 2110 03/24/24 0356 03/24/24 0822  BP: (!) 132/92 129/67 120/69 104/63  Pulse: 72 (!) 42 70 70  Resp: 17 16 14 17   Temp: 98.6 F (37 C) 98.3 F (36.8 C) 98.3 F (36.8 C) 98.4 F (36.9 C)  TempSrc: Oral Oral  Oral  SpO2: 100% (!) 34% 100% 100%  Weight:      Height:       Supplemental O2: Room Air SpO2: 100 % O2 Flow Rate (L/min): 3 L/min  Filed Weights   03/13/24 0800  Weight: 32.7 kg     Intake/Output Summary (Last 24 hours) at 03/24/2024 1326 Last data filed at 03/24/2024 0825 Gross per 24 hour  Intake 172.97 ml  Output --  Net 172.97 ml   Net IO Since Admission: 8,127.74 mL [03/24/24 1326]  Physical Exam: Const: Frail appearing woman in no acute distress sitting up in chair HENT: Normocephalic, atraumatic, mucus membranes moist Card: RRR, No MRG, No pitting edema on LE's bilaterally  Resp: LCTAB, no increased work of breathing Abd: Scaphoid abdomen, nontender to palpation. Extremities: Warm, dry, cachetic   Patient Lines/Drains/Airways Status     Active Line/Drains/Airways     Name Placement date Placement time Site Days   Peripheral IV 03/14/24 22 G 1 Posterior;Right Forearm 03/14/24  2029  Forearm  5   Wound 03/13/24 1739 Pressure Injury Buttocks Right Stage 2 -  Partial thickness loss of dermis presenting as a shallow open injury with a red, pink wound bed without slough. 03/13/24   1739  Buttocks  6   Wound 03/14/24 Irritant Contact Dermatitis Buttocks Medial 03/14/24  --  Buttocks  5            Pertinent labs and imaging:     Latest Ref Rng & Units 03/24/2024    6:14 AM 03/23/2024    5:13 AM 03/22/2024    4:48 AM  CBC  WBC 4.0 - 10.5 K/uL 6.5  5.6  6.6   Hemoglobin 12.0 - 15.0 g/dL 8.6  9.0  9.0   Hematocrit 36.0 - 46.0 % 26.8  28.7  27.8   Platelets 150 - 400 K/uL 237  228  194        Latest Ref Rng & Units 03/24/2024    6:14 AM 03/23/2024    5:13 AM 03/22/2024    4:48 AM  CMP  Glucose 70 - 99 mg/dL 69  805  78   BUN 8 - 23 mg/dL 21  28  28    Creatinine 0.44 - 1.00 mg/dL 8.36  8.32  8.41   Sodium 135 - 145 mmol/L 141  136  139   Potassium 3.5 - 5.1 mmol/L 4.3  3.9  4.3   Chloride 98 - 111 mmol/L 105  101  101   CO2 22 - 32 mmol/L 24  27  27    Calcium  8.9 - 10.3 mg/dL 8.0  7.6  6.7  No results found.  ASSESSMENT/PLAN:  Assessment: Principal Problem:   Syncope Active Problems:   Dizziness   Gastroesophageal reflux disease   CKD (chronic kidney disease) stage 3, GFR 30-59 ml/min (HCC)   Protein-calorie malnutrition, severe   Acute renal failure   Chronic diarrhea   Weakness generalized   Pressure injury of skin   AKI (acute kidney injury)   Failure to thrive  in adult   Hypoalbuminemia   Pancreatic insufficiency   Stage 4 chronic kidney disease (HCC)   Macrocytic anemia   Candidiasis, esophageal (HCC)   Chronic kidney disease (CKD), stage IV (severe) (HCC)   Diarrhea due to malabsorption   Goals of care, counseling/discussion   Severe protein-calorie malnutrition   Decreased appetite   Plan: #Failure to thrive  #Severe malnutrition - BMI 14.06 - The patient continues to endorse very limited appetite and reports that he just does not feel like eating - At this point the patient is continuously declining oral intake including nutritional supplements.  She is remaining persistently hypoglycemic at last receiving IV dextrose   due to her severely decreased physiologic reserve. In order to sustain her nutrition, she will require supplemental tube feeding.  I do not feel like in the long run this is a good option for her as she is exhibiting a failure to thrive  picture.  The patient is also adamantly refused tube placement and repeatedly reports that she does not want to be in pain. - Palliative following, appreciate recs.  Family pursuing full code, full scope of care.  Not amenable to discharge to hospice or home with hospice, despite patient with poor prognosis overall.    #Esophageal candidiasis   - EGD performed on 11/15 with suspected esophageal candidiasis - The patient denies pain at this point and is continuing to exhibit poor oral intake. - Continue IV fluconazole  through 12/2. - Continue viscous lidocaine  for symptomatic relief.  #Chronic diarrhea #Pancreatic insufficiency  -The patient was admitted on 11/11 for a syncopal episode.  Suspect this is secondary to severe malnutrition and chronic diarrhea. - GI has signed off this admission - Awaiting repeat fecal elastase however this has not been collected as patient's bowel movements have slowed down secondary to Imodium   #AKI Resolved, SCr stable at 1.63.  #Anemia -Patient with a history of chronic anemia secondary to CKD. -Baseline 7-8 -s/p 1unit PRBC 11/17 with improvement in Hgb. Continue to trend CBC's  #Goals of Care -See Dr. Evetta note from 11/21 - Palliative following, family pursuing full code, full scope of care.  Best Practice: Diet: Regular diet IVF: Fluids: none, Rate: None VTE: Place and maintain sequential compression device Start: 03/21/24 1144Pt refused injections and is unable to swallow pills--SCD's ordered.  Code: Full  Disposition planning: Therapy Recs: Pending, DME: other pending Family Contact: [name] Fontana, to be notified. DISPO: Pending  Missy Sandhoff, M.D.  Internal Medicine Resident, PGY-2  1:38 PM,  03/24/2024

## 2024-03-25 DIAGNOSIS — R627 Adult failure to thrive: Secondary | ICD-10-CM | POA: Diagnosis not present

## 2024-03-25 DIAGNOSIS — N179 Acute kidney failure, unspecified: Secondary | ICD-10-CM | POA: Diagnosis not present

## 2024-03-25 DIAGNOSIS — Z681 Body mass index (BMI) 19 or less, adult: Secondary | ICD-10-CM | POA: Diagnosis not present

## 2024-03-25 DIAGNOSIS — E43 Unspecified severe protein-calorie malnutrition: Secondary | ICD-10-CM | POA: Diagnosis not present

## 2024-03-25 LAB — GLUCOSE, CAPILLARY
Glucose-Capillary: 102 mg/dL — ABNORMAL HIGH (ref 70–99)
Glucose-Capillary: 103 mg/dL — ABNORMAL HIGH (ref 70–99)
Glucose-Capillary: 109 mg/dL — ABNORMAL HIGH (ref 70–99)
Glucose-Capillary: 110 mg/dL — ABNORMAL HIGH (ref 70–99)
Glucose-Capillary: 61 mg/dL — ABNORMAL LOW (ref 70–99)
Glucose-Capillary: 64 mg/dL — ABNORMAL LOW (ref 70–99)
Glucose-Capillary: 67 mg/dL — ABNORMAL LOW (ref 70–99)

## 2024-03-25 LAB — CBC
HCT: 28.2 % — ABNORMAL LOW (ref 36.0–46.0)
Hemoglobin: 8.8 g/dL — ABNORMAL LOW (ref 12.0–15.0)
MCH: 30.7 pg (ref 26.0–34.0)
MCHC: 31.2 g/dL (ref 30.0–36.0)
MCV: 98.3 fL (ref 80.0–100.0)
Platelets: 222 K/uL (ref 150–400)
RBC: 2.87 MIL/uL — ABNORMAL LOW (ref 3.87–5.11)
RDW: 17.2 % — ABNORMAL HIGH (ref 11.5–15.5)
WBC: 6.1 K/uL (ref 4.0–10.5)
nRBC: 0 % (ref 0.0–0.2)

## 2024-03-25 LAB — BASIC METABOLIC PANEL WITH GFR
Anion gap: 14 (ref 5–15)
BUN: 17 mg/dL (ref 8–23)
CO2: 19 mmol/L — ABNORMAL LOW (ref 22–32)
Calcium: 8.1 mg/dL — ABNORMAL LOW (ref 8.9–10.3)
Chloride: 107 mmol/L (ref 98–111)
Creatinine, Ser: 1.92 mg/dL — ABNORMAL HIGH (ref 0.44–1.00)
GFR, Estimated: 28 mL/min — ABNORMAL LOW (ref >60)
Glucose, Bld: 62 mg/dL — ABNORMAL LOW (ref 70–99)
Potassium: 5.4 mmol/L — ABNORMAL HIGH (ref 3.5–5.1)
Sodium: 140 mmol/L (ref 135–145)

## 2024-03-25 MED ORDER — FOLIC ACID 1 MG PO TABS
1.0000 mg | ORAL_TABLET | Freq: Every day | ORAL | Status: DC
  Administered 2024-03-26: 1 mg via ORAL
  Filled 2024-03-25: qty 1

## 2024-03-25 MED ORDER — DEXTROSE-SODIUM CHLORIDE 5-0.45 % IV SOLN: INTRAVENOUS | Status: AC

## 2024-03-25 MED ORDER — MAGNESIUM SULFATE 4 GM/100ML IV SOLN
4.0000 g | Freq: Once | INTRAVENOUS | Status: AC
  Administered 2024-03-25: 4 g via INTRAVENOUS
  Filled 2024-03-25: qty 100

## 2024-03-25 MED ORDER — THIAMINE MONONITRATE 100 MG PO TABS
100.0000 mg | ORAL_TABLET | Freq: Every day | ORAL | Status: DC
  Administered 2024-03-26: 100 mg via ORAL
  Filled 2024-03-25: qty 1

## 2024-03-25 NOTE — Progress Notes (Addendum)
 HD#11 SUBJECTIVE:  Patient Summary: Whitney Parrish is a 68 y.o. with a pertinent PMH of CKD stage III, chronic diarrhea, and GERD, who presented with a syncopal episode and admitted for syncope workup and failure to thrive .   Overnight Events: NAE  Interim History: The patient was seen and examined at the bedside this morning she was very teary and interactive with the team.  She was actively eating breakfast including eggs and pancakes with syrup.  She denies any acute complaints or concerns at this time.  OBJECTIVE:  Vital Signs: Vitals:   03/24/24 1626 03/24/24 2122 03/25/24 0357 03/25/24 0941  BP: 107/74 (!) 100/54 113/64 124/67  Pulse: 74 61 62 74  Resp: 17 15 16 17   Temp: 98.7 F (37.1 C) 98.1 F (36.7 C) (!) 97.3 F (36.3 C) 98.6 F (37 C)  TempSrc: Oral Oral  Oral  SpO2: 100% 100% 100% 100%  Weight:      Height:       Supplemental O2: Room Air SpO2: 100 % O2 Flow Rate (L/min): 3 L/min  Filed Weights   03/13/24 0800  Weight: 32.7 kg     Intake/Output Summary (Last 24 hours) at 03/25/2024 1020 Last data filed at 03/25/2024 0004 Gross per 24 hour  Intake 280 ml  Output --  Net 280 ml   Net IO Since Admission: 8,407.74 mL [03/25/24 1020]  Physical Exam: Const: Frail appearing woman in no acute distress eating breakfast HENT: Normocephalic, atraumatic, mucus membranes moist Card: RRR, No MRG, No pitting edema on LE's bilaterally  Resp: LCTAB, no increased work of breathing Abd: Scaphoid abdomen, nontender to palpation. Extremities: Warm, dry, cachetic Psych: alert and oriented x4, appropriate conversation   Patient Lines/Drains/Airways Status     Active Line/Drains/Airways     Name Placement date Placement time Site Days   Peripheral IV 03/14/24 22 G 1 Posterior;Right Forearm 03/14/24  2029  Forearm  5   Wound 03/13/24 1739 Pressure Injury Buttocks Right Stage 2 -  Partial thickness loss of dermis presenting as a shallow open injury with a red, pink  wound bed without slough. 03/13/24  1739  Buttocks  6   Wound 03/14/24 Irritant Contact Dermatitis Buttocks Medial 03/14/24  --  Buttocks  5            Pertinent labs and imaging:     Latest Ref Rng & Units 03/25/2024    5:58 AM 03/24/2024    6:14 AM 03/23/2024    5:13 AM  CBC  WBC 4.0 - 10.5 K/uL 6.1  6.5  5.6   Hemoglobin 12.0 - 15.0 g/dL 8.8  8.6  9.0   Hematocrit 36.0 - 46.0 % 28.2  26.8  28.7   Platelets 150 - 400 K/uL 222  237  228        Latest Ref Rng & Units 03/25/2024    5:58 AM 03/24/2024    6:14 AM 03/23/2024    5:13 AM  CMP  Glucose 70 - 99 mg/dL 62  69  805   BUN 8 - 23 mg/dL 17  21  28    Creatinine 0.44 - 1.00 mg/dL 8.07  8.36  8.32   Sodium 135 - 145 mmol/L 140  141  136   Potassium 3.5 - 5.1 mmol/L 5.4  4.3  3.9   Chloride 98 - 111 mmol/L 107  105  101   CO2 22 - 32 mmol/L 19  24  27    Calcium  8.9 - 10.3 mg/dL  8.1  8.0  7.6     No results found.  ASSESSMENT/PLAN:  Assessment: Principal Problem:   Syncope Active Problems:   Dizziness   Gastroesophageal reflux disease   CKD (chronic kidney disease) stage 3, GFR 30-59 ml/min (HCC)   Protein-calorie malnutrition, severe   Acute renal failure   Chronic diarrhea   Weakness generalized   Pressure injury of skin   AKI (acute kidney injury)   Failure to thrive  in adult   Hypoalbuminemia   Pancreatic insufficiency   Stage 4 chronic kidney disease (HCC)   Macrocytic anemia   Candidiasis, esophageal (HCC)   Chronic kidney disease (CKD), stage IV (severe) (HCC)   Diarrhea due to malabsorption   Goals of care, counseling/discussion   Severe protein-calorie malnutrition   Decreased appetite   Plan: #Failure to thrive  #Severe malnutrition - BMI 14.06 - The patient continues to endorse very limited appetite and reports that he just does not feel like eating -The patient was eating small bites of breakfast this morning, however still remains severely malnourished and mostly uninterested in oral  intake. RD is recommending cortrak, however patient is refusing, so we will not pursue this option at this time -Extensive conversations have been had over the past week and family desires to remain full code, full scope. (See previous documentation) -Continue nutritional supplements.  -Concern for refeeding syndrome if patient's oral intake picks up--Mag 1.4 yesterday, will replete  #Hypoglycemia -2/2 poor oral intake and lack of physiologic reserve -Continue q4 CBG checks.  -Supplement with IV dextrose  as necessary  #Esophageal candidiasis   - EGD performed on 11/15 with suspected esophageal candidiasis - Continue IV fluconazole  through 12/2. - Continue viscous lidocaine  for symptomatic relief.  #Chronic diarrhea #Pancreatic insufficiency  -The patient was admitted on 11/11 for a syncopal episode.  Suspect this is secondary to severe malnutrition and chronic diarrhea. - GI has signed off this admission - Awaiting repeat fecal elastase however this has not been collected.  #UTI -s/p 3-day course of ceftriaxone   #AKI Resolved  #Anemia -Patient with a history of chronic anemia secondary to CKD. -Baseline 7-8 -s/p 1unit PRBC 11/17 with improvement in Hgb. Continue to trend CBC's  #Goals of Care - Conversations continue to be had with the family, palliative, and the medicine team.  The patient does not have capacity to make healthcare decisions however she is still able to verbalize what she does and does not desire.  The patients current wishes are to not be in pain and that she does not want a feeding tube. At this point, will continue to hold off as the patient is actively refusing. -See Dr. Evetta note from 11/21 -Will highly encourage the healthcare power of attorney and next of kin, Whitney Parrish-her niece, to come to the hospital for an in person decision.  At this point we are in a holding pattern until a decision about further care can be made. -Continue full code, full scope  for now.   Best Practice: Diet: Regular diet IVF: Fluids: none, Rate: None VTE: Place and maintain sequential compression device Start: 03/21/24 1144Pt refused injections and is unable to swallow pills--SCD's ordered.  Code: Full  Disposition planning: Therapy Recs: Pending, DME: other pending Family Contact: [name] Fontana, to be notified. DISPO: Pending  Signature:  Schuyler Novak, DO [facility]  10:20 AM, 03/25/2024  On Call pager [phone]

## 2024-03-25 NOTE — Plan of Care (Signed)

## 2024-03-25 NOTE — Plan of Care (Signed)

## 2024-03-26 ENCOUNTER — Encounter (HOSPITAL_COMMUNITY): Payer: Self-pay | Admitting: Internal Medicine

## 2024-03-26 ENCOUNTER — Other Ambulatory Visit (HOSPITAL_COMMUNITY): Payer: Self-pay

## 2024-03-26 DIAGNOSIS — B3781 Candidal esophagitis: Secondary | ICD-10-CM | POA: Diagnosis not present

## 2024-03-26 DIAGNOSIS — E43 Unspecified severe protein-calorie malnutrition: Secondary | ICD-10-CM | POA: Diagnosis not present

## 2024-03-26 DIAGNOSIS — N179 Acute kidney failure, unspecified: Secondary | ICD-10-CM | POA: Diagnosis not present

## 2024-03-26 DIAGNOSIS — R627 Adult failure to thrive: Secondary | ICD-10-CM | POA: Diagnosis not present

## 2024-03-26 DIAGNOSIS — Z515 Encounter for palliative care: Secondary | ICD-10-CM | POA: Diagnosis not present

## 2024-03-26 LAB — BASIC METABOLIC PANEL WITH GFR
Anion gap: 4 — ABNORMAL LOW (ref 5–15)
BUN: 14 mg/dL (ref 8–23)
CO2: 30 mmol/L (ref 22–32)
Calcium: 7.8 mg/dL — ABNORMAL LOW (ref 8.9–10.3)
Chloride: 103 mmol/L (ref 98–111)
Creatinine, Ser: 1.68 mg/dL — ABNORMAL HIGH (ref 0.44–1.00)
GFR, Estimated: 33 mL/min — ABNORMAL LOW (ref >60)
Glucose, Bld: 85 mg/dL (ref 70–99)
Potassium: 4.5 mmol/L (ref 3.5–5.1)
Sodium: 137 mmol/L (ref 135–145)

## 2024-03-26 LAB — GLUCOSE, CAPILLARY
Glucose-Capillary: 103 mg/dL — ABNORMAL HIGH (ref 70–99)
Glucose-Capillary: 109 mg/dL — ABNORMAL HIGH (ref 70–99)
Glucose-Capillary: 94 mg/dL (ref 70–99)
Glucose-Capillary: 97 mg/dL (ref 70–99)

## 2024-03-26 LAB — PHOSPHORUS: Phosphorus: 2.6 mg/dL (ref 2.5–4.6)

## 2024-03-26 LAB — MAGNESIUM: Magnesium: 2.5 mg/dL — ABNORMAL HIGH (ref 1.7–2.4)

## 2024-03-26 MED ORDER — PANCRELIPASE (LIP-PROT-AMYL) 12000-38000 UNITS PO CPEP
12000.0000 [IU] | ORAL_CAPSULE | Freq: Three times a day (TID) | ORAL | 0 refills | Status: DC
  Filled 2024-03-26: qty 200, 67d supply, fill #0

## 2024-03-26 MED ORDER — SIMETHICONE 40 MG/0.6ML PO SUSP
40.0000 mg | Freq: Four times a day (QID) | ORAL | 0 refills | Status: AC | PRN
  Filled 2024-03-26: qty 60, 25d supply, fill #0

## 2024-03-26 MED ORDER — MIRTAZAPINE 7.5 MG PO TABS
7.5000 mg | ORAL_TABLET | Freq: Every day | ORAL | 0 refills | Status: AC
  Filled 2024-03-26: qty 30, 30d supply, fill #0

## 2024-03-26 MED ORDER — LOPERAMIDE HCL 1 MG/7.5ML PO SUSP
4.0000 mg | Freq: Three times a day (TID) | ORAL | 0 refills | Status: AC
  Filled 2024-03-26: qty 2700, 30d supply, fill #0

## 2024-03-26 MED ORDER — PANCRELIPASE (LIP-PROT-AMYL) 12000-38000 UNITS PO CPEP
12000.0000 [IU] | ORAL_CAPSULE | Freq: Three times a day (TID) | ORAL | 0 refills | Status: AC
  Filled 2024-03-26: qty 200, 67d supply, fill #0

## 2024-03-26 MED ORDER — ADULT MULTIVITAMIN W/MINERALS CH
1.0000 | ORAL_TABLET | Freq: Every day | ORAL | 0 refills | Status: AC
  Filled 2024-03-26: qty 30, 30d supply, fill #0

## 2024-03-26 MED ORDER — ACETAMINOPHEN 325 MG PO TABS
650.0000 mg | ORAL_TABLET | Freq: Four times a day (QID) | ORAL | 0 refills | Status: AC | PRN
  Filled 2024-03-26: qty 100, 13d supply, fill #0

## 2024-03-26 MED ORDER — PANCRELIPASE (LIP-PROT-AMYL) 12000-38000 UNITS PO CPEP
12000.0000 [IU] | ORAL_CAPSULE | Freq: Three times a day (TID) | ORAL | Status: DC
  Administered 2024-03-26: 12000 [IU] via ORAL
  Filled 2024-03-26 (×2): qty 1

## 2024-03-26 MED ORDER — POTASSIUM CHLORIDE CRYS ER 20 MEQ PO TBCR
20.0000 meq | EXTENDED_RELEASE_TABLET | Freq: Every day | ORAL | 0 refills | Status: DC
  Filled 2024-03-26: qty 30, 30d supply, fill #0

## 2024-03-26 MED ORDER — FLUCONAZOLE 200 MG PO TABS
200.0000 mg | ORAL_TABLET | Freq: Every day | ORAL | 0 refills | Status: AC
  Filled 2024-03-26: qty 8, 8d supply, fill #0

## 2024-03-26 NOTE — Discharge Summary (Signed)
 Name: Whitney Parrish MRN: [id number] DOB: 25-Jun-1955 68 y.o. PCP: Whitney Jereld BROCKS, NP  Date of Admission: 03/13/2024  7:52 AM Date of Discharge: 03/26/2024 Attending Physician: Dr. Mliss Parrish  Discharge Diagnosis: 1. Principal Problem:   Syncope Active Problems:   Dizziness   Gastroesophageal reflux disease   CKD (chronic kidney disease) stage 3, GFR 30-59 ml/min (HCC)   Protein-calorie malnutrition, severe   Acute renal failure   Chronic diarrhea   Weakness generalized   Pressure injury of skin   AKI (acute kidney injury)   Failure to thrive  in adult   Hypoalbuminemia   Pancreatic insufficiency   Stage 4 chronic kidney disease (HCC)   Macrocytic anemia   Candidiasis, esophageal (HCC)   Chronic kidney disease (CKD), stage IV (severe) (HCC)   Diarrhea due to malabsorption   Goals of care, counseling/discussion   Severe protein-calorie malnutrition   Decreased appetite   Discharge Medications: Allergies as of 03/26/2024       Reactions   Sulfa Antibiotics Other (See Comments)   hallucinations        Medication List     STOP taking these medications    augmented betamethasone  dipropionate 0.05 % ointment Commonly known as: DIPROLENE -AF   cyanocobalamin  1000 MCG tablet   folic acid  1 MG tablet Commonly known as: FOLVITE    guselkumab  100 MG/ML pen Commonly known as: TREMFYA    meclizine  25 MG tablet Commonly known as: ANTIVERT    nicotine  21 mg/24hr patch Commonly known as: NICODERM CQ  - dosed in mg/24 hours   nicotine  polacrilex 2 MG gum Commonly known as: NICORETTE    potassium chloride  SA 20 MEQ tablet Commonly known as: KLOR-CON  M   psyllium 95 % Pack Commonly known as: HYDROCIL/METAMUCIL   Vitamin D  (Ergocalciferol ) 1.25 MG (50000 UNIT) Caps capsule Commonly known as: DRISDOL        TAKE these medications    acetaminophen  325 MG tablet Commonly known as: TYLENOL  Take 2 tablets (650 mg total) by mouth every 6 (six) hours as  needed for mild pain (pain score 1-3), headache or moderate pain (pain score 4-6).   albuterol  108 (90 Base) MCG/ACT inhaler Commonly known as: VENTOLIN  HFA Inhale 2 puffs into the lungs every 6 (six) hours as needed for wheezing or shortness of breath.   CertaVite/Antioxidants Tabs Take 1 tablet by mouth daily. Start taking on: March 27, 2024   Creon  12000-38000 units Cpep capsule Generic drug: lipase/protease/amylase Take 1 capsule (12,000 Units total) by mouth 3 (three) times daily before meals. You may open the capsule and sprinkle on soft food but DO NOT CHEW BEADS. Swallow beads whole   Dulera  200-5 MCG/ACT Aero Generic drug: mometasone -formoterol  Inhale 1 puff into the lungs daily.   fluconazole  200 MG tablet Commonly known as: DIFLUCAN  Take 1 tablet (200 mg total) by mouth daily for 8 days.   loperamide  HCl 1 MG/7.5ML suspension Commonly known as: IMODIUM  Take 30 mLs (4 mg total) by mouth 3 (three) times daily.   mirtazapine  7.5 MG tablet Commonly known as: REMERON  Take 1 tablet (7.5 mg total) by mouth daily. Start taking on: March 27, 2024   Simethicone  Drops Infants 20 MG/0.3ML drops Generic drug: simethicone  Take 0.6 mLs (40 mg total) by mouth 4 (four) times daily as needed for flatulence.               Durable Medical Equipment  (From admission, onward)           Start     Ordered  03/15/24 1327  For home use only DME Other see comment  Once       Comments: Transport chair ht 5', wt 32.7 kg  Question:  Length of Need  Answer:  Lifetime   03/15/24 1327   03/15/24 1326  For home use only DME 4 wheeled rolling walker with seat  Once       Question:  Patient needs a walker to treat with the following condition  Answer:  Weakness   03/15/24 1327              Discharge Care Instructions  (From admission, onward)           Start     Ordered   03/26/24 0000  Discharge wound care:       Comments: Cleanse B medial buttocks and  perirectal area with soap and water, dry and apply Gerhardt's Butt Cream to entire area 2 times daily and prn soiling.  Cover Stage 2 PI R lateral buttock with silicone foam, lift daily to assess and change foam q3 days and prn soiling.   03/26/24 1350            Disposition and follow-up:   Whitney Parrish was discharged from [facility] in Fair condition.  At the hospital follow up visit please address:  Severe Malnutrition--ensure patient is tolerating oral intake and not becoming hypoglycemic. Also that her electrolytes are stable Esophageal candidiasis--pt to take fluconazole  200mg  through 12/2 Pancreatic insufficiency and chronic diarrhea--fecal elastase low while inpatient and she was started on Creon . Ensure she is taking and assess for improvement of symptoms.  Sacral wound--assess for healing and understanding of wound care Goals of Care--pt would greatly benefit from POA paperwork   2.  Labs / imaging needed at time of follow-up: BMP (electrolytes and AKI)   Follow-up Appointments:  Contact information for after-discharge care     Durable Medical Equipment     [facility] (DME) .   Service: Durable Medical Equipment Contact information: [address] [place]  [address] [phone]             Home Medical Care     [facility]) Follow up.   Service: Home Health Services Contact information: [address] [place]  [phone]                      Hospital Course by problem list: Whitney Parrish is a 68 y.o. with a pertinent PMH of CKD stage III, chronic diarrhea, and GERD, who presented with a syncopal episode and admitted for syncope workup and failure to thrive  now being discharged on hospital day 12 with the following pertinent hospital course:  Chronic Diarrhea  Esophageal Candidiasis The patient was admitted on 11/11 after a  syncopal episode at home.  Upon arrival she was found to be severely dehydrated with a non-anion anion gap metabolic acidosis and associated AKI and was also found to have significant diarrhea that has been chronic in nature over the past 6 months.  Her folate, vitamin A , vitamin B12 were all deficient suggesting severe malabsorption.  The patient also had a BMI of 14 with significantly notable weight loss over the past few months.  At this time there was concern for severe malnutrition and pancreatic insufficiency.  GI was consulted who performed an EGD and did find esophageal candidiasis.  A colonoscopy was scheduled however was unable to  be complete due to intolerance of colonoscopy prep.  Stool studies were also performed which did show a severely diminished fecal elastase at 37 however there was concern that this was dilutional due to watery diarrhea.  Creon  was initiated while inpatient with hopes of improvement in absorption and decreased diarrhea however the patient was refusing oral intake and therefore was not receiving the medication.  RD was consulted and the patient was placed on nutritional supplementations including significant vitamin repletion, ensures, and mighty shakes.  Over the coming days the patient's oral intake minimally increased however the patient showed a continuous disinterest in eating and would also spit out her food and medications.  She would frequently become hypoglycemic and would require IV dextrose  to maintain normoglycemia.    At this time, nutrition did highly recommend a feeding tube in order to maintain the patient's nutritional status.  This was discussed with the family and they did agree however the patient adamantly refused when the core track team attempted to place.  That same day conversations were had with the patient where she repeatedly stated that she did not want a tube and would continue to refuse a tube.  It was deemed that she did have the ability to make  this decision as she was clearly understanding what we are trying to do and actively refusing.  At this time we did hold off on any feeding tube placement.  On 11/21 the patient to begin eating more food and taking oral medications again.  She was monitored over the coming days without signs of refeeding syndrome.  Extensive conversations were had with her family (see below) and the patient was deemed medically stable for discharge to home on 11/24.  She was discharged with Creon  and fluconazole  to finish on 12/2.  There was great concern that the patient would return to the hospital due to state of severe malnutrition, resultant hypoglycemia and poor ambulation.  Goals of care Palliative care was consulted on the patient as she is 59, has had significant weight loss without clear explanation, has dementia, and is showing signs of poor interest in oral intake. After the initial conversation the family did continue to desire full code full scope.  Multiple conversations were had between the medical team and the palliative care team along with the family and power of attorney about the state of the patient's health and the potential discomfort we are causing the patient by pursuing aggressive medical therapies.  After these conversations, the family did continue to desire full code, full scope with hopes of the patient recovering.  These interventions included a feeding tube however the patient was still able to state her values and that she would not tolerated tube or allow anyone to place a tube anywhere on her body.  This was discussed with the family who verbalized understanding.  Upon day of discharge, 11/24, conversations were had with the family with strict instructions of medications and continued treatment of esophageal candidiasis along with the risks of the patient going home.  They did verbalize understanding and the patient was discharged home.  Stable chronic medical  conditions: CKD Anemia Depression GERD Psoriasis  Subjective The patient was seen and examined sitting up in the chair this morning.  Before evaluation she was seen walking with physical therapy and was in a very sociable mood.  She reported that she was feeling much better today and that her stomach was not cramping nearly as much.  She was also endorsing an appetite.  We did discuss discharging home today to which she is excited about.  Discharge Exam:   BP 101/80   Pulse 78   Temp 98.2 F (36.8 C) (Oral)   Resp 17   Ht 5' (1.524 m)   Wt 32.7 kg   SpO2 100%   BMI 14.06 kg/m  Discharge exam:  Const: Awake, cachetic appearing, interactive and pleasant HENT: Normocephalic, atraumatic, mucus membranes moist Eyes: PERRL Card: RRR, No MRG, No pitting edema on LE's bilaterally  Resp: LCTAB, no increased work of breathing Abd: scaphoid, NTND Extremities: Warm, dry with bilateral skin tenting   Pertinent Labs, Studies, and Procedures:     Latest Ref Rng & Units 03/25/2024    5:58 AM 03/24/2024    6:14 AM 03/23/2024    5:13 AM  CBC  WBC 4.0 - 10.5 K/uL 6.1  6.5  5.6   Hemoglobin 12.0 - 15.0 g/dL 8.8  8.6  9.0   Hematocrit 36.0 - 46.0 % 28.2  26.8  28.7   Platelets 150 - 400 K/uL 222  237  228        Latest Ref Rng & Units 03/26/2024    4:58 AM 03/25/2024    5:58 AM 03/24/2024    6:14 AM  CMP  Glucose 70 - 99 mg/dL 85  62  69   BUN 8 - 23 mg/dL 14  17  21    Creatinine 0.44 - 1.00 mg/dL 8.31  8.07  8.36   Sodium 135 - 145 mmol/L 137  140  141   Potassium 3.5 - 5.1 mmol/L 4.5  5.4  4.3   Chloride 98 - 111 mmol/L 103  107  105   CO2 22 - 32 mmol/L 30  19  24    Calcium  8.9 - 10.3 mg/dL 7.8  8.1  8.0     US  THYROID  Result Date: 03/14/2024 CLINICAL DATA:  Goiter. EXAM: THYROID  ULTRASOUND TECHNIQUE: Ultrasound examination of the thyroid  gland and adjacent soft tissues was performed. COMPARISON:  None Available. FINDINGS: Parenchymal Echotexture: Normal Isthmus: 0.3 cm  Right lobe: 4.6 x 1.8 x 1.3 cm Left lobe: 4.2 x 1.8 x 2.0 cm _________________________________________________________ Estimated total number of nodules >/= 1 cm: 3 Number of spongiform nodules >/=  2 cm not described below (TR1): 0 Number of mixed cystic and solid nodules >/= 1.5 cm not described below (TR2): 0 _________________________________________________________ At least 3 isoechoic predominantly solid nodules are present in the left mid and lower gland. None of the lesions measures larger than 1.4 cm. All are considered TI-RADS category 3. Given size (<1.4 cm) and appearance, this nodule does NOT meet TI-RADS criteria for biopsy or dedicated follow-up. IMPRESSION: 1. Mildly heterogeneous and enlarged thyroid  gland. 2. Several sonographically low risk nodules are noted incidentally in the left mid and lower gland. None meet criteria to warrant biopsy or dedicated imaging surveillance. The above is in keeping with the ACR TI-RADS recommendations - J Am Coll Radiol 2017;14:587-595. Electronically Signed   By: Wilkie Lent M.D.   On: 03/14/2024 12:38   DG Chest Portable 1 View Result Date: 03/13/2024 EXAM: 1 VIEW(S) XRAY OF THE CHEST 03/13/2024 09:35:00 AM COMPARISON: None available. CLINICAL HISTORY: ams FINDINGS: LUNGS AND PLEURA: No focal pulmonary opacity. No pulmonary edema. No pleural effusion. No pneumothorax. HEART AND MEDIASTINUM: No acute abnormality of the cardiac and mediastinal silhouettes. BONES AND SOFT TISSUES: No acute osseous abnormality. IMPRESSION: 1. No acute cardiopulmonary abnormality. Electronically signed by: Rogelia Myers MD 03/13/2024 09:56 AM EST RP  Workstation: [id number]   CT ABDOMEN PELVIS WO CONTRAST Result Date: 03/13/2024 CLINICAL DATA:  Abdominal pain, acute, nonlocalized Altered mental status for 5 days. Recently hospitalized for pneumonia. Decreased oral intake. EXAM: CT ABDOMEN AND PELVIS WITHOUT CONTRAST TECHNIQUE: Multidetector CT imaging of the abdomen and  pelvis was performed following the standard protocol without IV contrast. RADIATION DOSE REDUCTION: This exam was performed according to the departmental dose-optimization program which includes automated exposure control, adjustment of the mA and/or kV according to patient size and/or use of iterative reconstruction technique. COMPARISON:  Abdominopelvic CT 02/18/2024 and 07/08/2020. FINDINGS: Technical note: There is significant beam hardening artifact related to a bracelet on the right wrist. This limits assessment of the right lower chest and liver. Lower chest: Clear lung bases. No significant pleural or pericardial effusion. Hepatobiliary: Allowing for the artifact, the liver appears stable, without acute or focal abnormality on noncontrast imaging. No evidence of gallstones, gallbladder wall thickening or biliary dilatation. Pancreas: Unremarkable. No pancreatic ductal dilatation or surrounding inflammatory changes. Spleen: Normal in size without focal abnormality. Adrenals/Urinary Tract: Both adrenal glands appear normal. Unchanged bilateral nephrolithiasis, measuring up to 1.1 cm in the lower pole of the left kidney. No evidence of ureteral calculus or hydronephrosis. There is mild renal cortical scarring bilaterally. The bladder appears unremarkable for its degree of distention. Stomach/Bowel: No enteric contrast administered. The stomach appears unremarkable for its degree of distension. No evidence of bowel wall thickening, distention or surrounding inflammatory change. Retrocecal surgical clips consistent with prior appendectomy. Prominent liquid stool in the distal colon. Vascular/Lymphatic: No enlarged abdominopelvic lymph nodes are identified. Mild aortoiliac atherosclerosis without evidence of aneurysm. Reproductive: Status post hysterectomy. No adnexal mass. Air in the vagina. Other: No evidence of abdominal wall mass or hernia. No ascites or pneumoperitoneum. Musculoskeletal: No acute or  significant osseous findings. Mild lower lumbar facet arthropathy. IMPRESSION: 1. No acute findings or explanation for the patient's symptoms. 2. The visualized lung bases no appear clear, without significant residual right lower lobe bronchopneumonia. 3. Prominent liquid stool in the distal colon suggesting diarrheal state. No evidence of bowel wall thickening or surrounding inflammation. 4. Unchanged bilateral nephrolithiasis. No evidence of ureteral calculus or hydronephrosis. 5.  Aortic Atherosclerosis (ICD10-I70.0). Electronically Signed   By: Elsie Perone M.D.   On: 03/13/2024 09:20   CT Head Wo Contrast Result Date: 03/13/2024 EXAM: CT HEAD WITHOUT CONTRAST 03/13/2024 08:48:00 AM TECHNIQUE: CT of the head was performed without the administration of intravenous contrast. Automated exposure control, iterative reconstruction, and/or weight based adjustment of the mA/kV was utilized to reduce the radiation dose to as low as reasonably achievable. COMPARISON: Brain MRI 11/15/2011. Head CT 02/18/2024. CLINICAL HISTORY: 68 year old female with altered mental status, recent pneumonia, and decreased oral intake. FINDINGS: BRAIN AND VENTRICLES: No acute hemorrhage. No evidence of acute infarct. No hydrocephalus. No extra-axial collection. No mass effect or midline shift. Brain volume is stable within normal limits for age. Gray white differentiation is stable, within normal limits for age. Mild for age calcified atherosclerosis at the skull base. No suspicious intracranial vascular hyperdensity. ORBITS: No acute abnormality. SINUSES: Paranasal sinuses, middle ears and mastoids are clear. SOFT TISSUES AND SKULL: No acute soft tissue abnormality. No skull fracture. IMPRESSION: 1. Normal for age non-contrast head CT appearance of the brain. Electronically signed by: Helayne Hurst MD 03/13/2024 08:59 AM EST RP Workstation: [id number]     Discharge Instructions:  Thank you for allowing us  to be part of your care.  You were hospitalized because you  were feeling weak, had an episode of passing out, and were experiencing chronic diarrhea. We treated your symptoms during your stay, and we are glad to see that your appetite has improved and you are able to eat better now.  Medication Changes  We STARTED the following medications:  Creon  - Take 1 capsule three times daily with meals to help with diarrhea. Fluconazole  200 mg - Take once daily for the next 8 days. Mirtazapine  7.5 mg nightly - Helps improve appetite and mood. Multivitamin - Take once daily.  Continue using your inhalers as prescribed for your breathing.  Imodium  - Take as needed for diarrhea. Simethicone  - Take as needed for abdominal discomfort or gas.  We STOPPED the following medications: -All home vitamins.  Follow-Up Appointments  Please schedule an appointment with your primary care doctor.  Please follow up with urology for routine urinary catheter changes.  We have arranged an outpatient occupational therapy appointment for you.  If you notice worsening symptoms--such as fever, severe abdominal pain, inability to eat or drink, dizziness, or recurrent fainting--please contact your doctor or go to the emergency department.  We are glad you are feeling better,  Missy Sandhoff Internal Medicine Inpatient Teaching Service at [facility]  Discharge Instructions     Call MD for:  extreme fatigue   Complete by: As directed    Call MD for:  persistant dizziness or light-headedness   Complete by: As directed    Call MD for:  persistant nausea and vomiting   Complete by: As directed    Diet - low sodium heart healthy   Complete by: As directed    Diet general   Complete by: As directed    Discharge instructions   Complete by: As directed    - Follow-up with primary care doctor.   Discharge wound care:   Complete by: As directed    Cleanse B medial buttocks and perirectal area with soap and water, dry and apply Gerhardt's  Butt Cream to entire area 2 times daily and prn soiling.  Cover Stage 2 PI R lateral buttock with silicone foam, lift daily to assess and change foam q3 days and prn soiling.   Increase activity slowly   Complete by: As directed        Signed: [name] Bitters, DO 03/26/2024, 5:11 PM

## 2024-03-26 NOTE — TOC Progression Note (Signed)
 Transition of Care (TOC) - Progression Note    Patient for discharge today.   Spoke to [employer] 336 [phone] discussed home health PT arranged with [facility]. Amy with [facility] aware discharge is today.   Orders for transport chair and rolator [name] states they do not need either piece of DME   [name] states someone will come to the hospital and provide patient with transport home .   Patient Details  Name: Whitney Parrish MRN: [id number] Date of Birth: May 12, 1955  Transition of Care [facility]) CM/SW Contact  Mayerli Kirst, Powell Jansky, RN Phone Number: 03/26/2024, 2:32 PM  Clinical Narrative:       Expected Discharge Plan: Home w Home Health Services Barriers to Discharge: Continued Medical Work up               Expected Discharge Plan and Services   Discharge Planning Services: CM Consult Post Acute Care Choice: Durable Medical Equipment, Home Health Living arrangements for the past 2 months: Apartment Expected Discharge Date: 03/26/24               DME Arranged: Vannie rolling with seat, Other see comment (transport chair) DME Agency: [employer] Date DME Agency Contacted: 03/15/24 Time DME Agency Contacted: 1321 Representative spoke with at DME Agency: [facility] HH Arranged: OT, PT   Date HH Agency Contacted: 03/15/24 Time HH Agency Contacted: 1322 Representative spoke with at [facility] Agency: [facility] awaiting determination   Social Drivers of Health (SDOH) Interventions SDOH Screenings   Food Insecurity: No Food Insecurity (03/13/2024)  Housing: Low Risk  (03/13/2024)  Recent Concern: Housing - High Risk (02/19/2024)  Transportation Needs: No Transportation Needs (03/13/2024)  Utilities: Not At Risk (03/13/2024)  Social Connections: Moderately Integrated (03/13/2024)  Tobacco Use: High Risk (03/13/2024)    Readmission Risk Interventions    02/22/2024    2:28 PM  Readmission Risk Prevention Plan  Transportation Screening Complete  PCP or  Specialist Appt within 5-7 Days Complete  Home Care Screening Complete  Medication Review (RN CM) Referral to Pharmacy

## 2024-03-26 NOTE — Discharge Instructions (Addendum)
 Thank you for allowing us  to be part of your care. You were hospitalized because you were feeling weak, had an episode of passing out, and were experiencing chronic diarrhea. We treated your symptoms during your stay, and we are glad to see that your appetite has improved and you are able to eat better now.  Medication Changes  We STARTED the following medications:  Creon  - Take 1 capsule three times daily with meals to help with diarrhea. Fluconazole  200 mg - Take once daily for the next 8 days. Mirtazapine  7.5 mg nightly - Helps improve appetite and mood. Multivitamin - Take once daily.  Continue using your inhalers as prescribed for your breathing.  Imodium  - Take as needed for diarrhea. Simethicone  - Take as needed for abdominal discomfort or gas.  We STOPPED the following medications: -All home vitamins.  Follow-Up Appointments  Please schedule an appointment with your primary care doctor.  Please follow up with urology for routine urinary catheter changes.  We have arranged an outpatient occupational therapy appointment for you.  If you notice worsening symptoms--such as fever, severe abdominal pain, inability to eat or drink, dizziness, or recurrent fainting--please contact your doctor or go to the emergency department.  We are glad you are feeling better,  Whitney Parrish Internal Medicine Inpatient Teaching Service at [facility]

## 2024-03-26 NOTE — Progress Notes (Signed)
 Daily Progress Note   Date: 03/26/2024   Patient Name: Whitney Parrish  DOB: 1955-10-10  MRN: [id number]  Age / Sex: 68 y.o., female  Attending Physician: No att. providers found Primary Care Physician: Whitney Jereld BROCKS, NP Admit Date: 03/13/2024 Length of Stay: 12 days  Reason for Follow-up: Establishing goals of care  Past Medical History:  Diagnosis Date   Anxiety    Arthritis    shoulders and hands   Asthma    Benign positional vertigo    Chronically dry eyes    CKD (chronic kidney disease), stage III (HCC)    Depression    Dyspnea on exertion    GERD (gastroesophageal reflux disease)    Hematuria    History of acute pyelonephritis    History of Bell's palsy    1980's left side-- residual lip numbness intermittant   History of chest pain    non-cardiac per dr claudene notes (pt's pcp)   History of panic attacks    Hypertension    Left ureteral stone    Loose bowel movements    chronic-per pt residual from injury during abdominal surgery in 1986   Migraines    Nephrolithiasis    left    Psoriasis    Urgency of urination    Wears dentures    has upper and lower but only wears upper    Assessment & Plan:   HPI/Patient Profile:  68 y.o. female  with past medical history of CKD IV, hypokalemia, psoriasis, depression admitted on 03/13/2024 with AKI on CKD, severe protein calorie malnutrition, and acute metabolic encephalopathy. Patient most recently admitted 10/18-10/25 for a similar presentation with syncope and generalized weakness.   Palliative medicine consulted for goals of care conversation.    SUMMARY OF RECOMMENDATIONS  Continue full code, full scope, had a frank discussion with niece Whitney Parrish) about futility of continued interventions and risks vs benefits Coretrak attempted to be placed but patient refused, continue GOC with Whitney Parrish, see IPAL note by Whitney Parrish on 11/21 Consider outpatient palliative following, but after discussion with primary  team, Whitney Parrish may not be ready for further discussions at this time  Symptom Management:  Per primary team  Code Status: Full Code  Prognosis: < 6 months  Discharge Planning: Home with Home Health  Discussed with: [name] DO about considering outpatient palliative following for further deterioration.   Subjective:   Subjective: Chart Reviewed. Updates received. Patient Assessed. Created space and opportunity for patient  and family to explore thoughts and feelings regarding current medical situation.  Today's Discussion:  Discussed with Whitney Parrish about the patient's progress. He reiterates and wonders why the patient is not eating - continued to reinforce that his is likely due to her dementia. Shared with Whitney Parrish my concerns for after he asked what the next steps are for the patient. I shared that I am concerned that she will become hypoglycemic and will need another admission to correct it due to her poor PO. Posited the question of what will he do next when she does not eat. Offered palliative following outpatient but it appears the Whitney Parrish (niece and [facility] management consultant) is not ready for further discussions at this time.   Patient was up sitting in chair during visit with lunch tray but had not eaten anything.  Review of Systems  Constitutional:  Positive for appetite change and fatigue.    Objective:   Primary Diagnoses: Present on Admission:  Syncope  Gastroesophageal reflux disease  CKD (chronic kidney disease)  stage 3, GFR 30-59 ml/min (HCC)  Protein-calorie malnutrition, severe  Acute renal failure  Chronic diarrhea   Vital Signs:  BP 101/80   Pulse 78   Temp 98.2 F (36.8 C) (Oral)   Resp 17   Ht 5' (1.524 m)   Wt 32.7 kg   SpO2 100%   BMI 14.06 kg/m   Physical Exam Constitutional:      Comments: Cachectic  HENT:     Head: Normocephalic.     Nose: Nose normal.     Mouth/Throat:     Mouth: Mucous membranes are dry.  Eyes:     Extraocular Movements:  Extraocular movements intact.  Cardiovascular:     Rate and Rhythm: Normal rate.  Pulmonary:     Effort: Pulmonary effort is normal.  Abdominal:     General: Abdomen is flat.  Musculoskeletal:     Right lower leg: No edema.     Left lower leg: No edema.  Neurological:     Mental Status: She is alert. She is disoriented.     Palliative Assessment/Data: 20%   Existing Vynca/ACP Documentation: None  Thank you for allowing us  to participate in the care of Whitney Parrish PMT will continue to support holistically.  I personally spent a total of 35 minutes in the care of the patient today including preparing to see the patient, getting/reviewing separately obtained history, performing a medically appropriate exam/evaluation, counseling and educating, referring and communicating with other health care professionals, and documenting clinical information in the EHR.   Whitney Parrish  Palliative Medicine Team  Team Phone # [phone] (Nights/Weekends) 03/26/2024 6:38 PM

## 2024-03-26 NOTE — Progress Notes (Signed)
 Reprinted AVS per [facility] who stated she updated some things. New copy given to pt at discharge

## 2024-03-26 NOTE — Plan of Care (Signed)
   Problem: Education: Goal: Knowledge of General Education information will improve Description: Including pain rating scale, medication(s)/side effects and non-pharmacologic comfort measures Outcome: Progressing   Problem: Activity: Goal: Risk for activity intolerance will decrease Outcome: Progressing   Problem: Nutrition: Goal: Adequate nutrition will be maintained Outcome: Progressing   Problem: Coping: Goal: Level of anxiety will decrease Outcome: Progressing   Problem: Elimination: Goal: Will not experience complications related to bowel motility Outcome: Progressing   Problem: Safety: Goal: Ability to remain free from injury will improve Outcome: Progressing   Problem: Skin Integrity: Goal: Risk for impaired skin integrity will decrease Outcome: Progressing

## 2024-03-26 NOTE — Progress Notes (Signed)
 Mobility Specialist Progress Note:    03/26/24 0920  Mobility  Activity Ambulated with assistance (In hallway)  Level of Assistance Contact guard assist, steadying assist  Assistive Device Front wheel walker  Distance Ambulated (ft) 105 ft  Activity Response Tolerated well  Mobility Referral Yes  Mobility visit 1 Mobility  Mobility Specialist Start Time (ACUTE ONLY) 0907  Mobility Specialist Stop Time (ACUTE ONLY) 0920  Mobility Specialist Time Calculation (min) (ACUTE ONLY) 13 min   Received pt in chair and hesitant, but agreeable to mobility. Pt required MinG and chair follow for safety. No c/o. Pt returned to room and left in chair with alarm on. Personal belongings and call light within reach. All needs met.  Lavanda Pollack Mobility Specialist  Please contact via [employer] or  Rehab Office [phone]

## 2024-03-26 NOTE — Progress Notes (Signed)
 Occupational Therapy Treatment Patient Details Name: Whitney Parrish MRN: [id number] DOB: 14-Jul-1955 Today's Date: 03/26/2024   History of present illness Pt is a 68 y.o. female presenting 03/13/24 with syncope and generalized weakness for the past 2 weeks. Workup for failure to thrive , worsening chronic diarrhea, NAGMA, and acute metabolic encephalopathy. Pt underwent Upper GI endoscopy 11/15 which demonstrated esophageal plaques suspicious for candidiasis, erosive gastropathy, and 3 duodenal polyps.  PMH: CKD4, hypokalemia, asthma, GERD, psoriasis, depression, and insomnia.  Of note, recent hospitalization 10/18-10/25 for syncopal episode found to have severe hypokalemia secondary to chronic diarrhea   OT comments  Pt is progressing towards OT goals. Focus of session on increasing safe independence with ADL tasks in home environment. Pt respectfully declined mobility out of recliner for ADLs in bathroom. Pt educated on strategies to reduce dizziness with mobility. Pt verbalized understanding but would benefit from continued education and reinforcement of strategies. Pt required supervision for LB ADL dressing. OT to continue per POC.       If plan is discharge home, recommend the following:  A little help with walking and/or transfers;A lot of help with bathing/dressing/bathroom;Assistance with cooking/housework;Direct supervision/assist for financial management;Assist for transportation;Help with stairs or ramp for entrance;Direct supervision/assist for medications management;Supervision due to cognitive status   Equipment Recommendations  BSC/3in1    Recommendations for Other Services      Precautions / Restrictions Precautions Precautions: Fall Recall of Precautions/Restrictions: Impaired Restrictions Weight Bearing Restrictions Per Provider Order: No       Mobility Bed Mobility               General bed mobility comments: Pt greeted in recliner. Able to reposition self in  recliner and sit without posterior support for LB dressing. Pt respectfully declined activity out of recliner.    Transfers                   General transfer comment: Pt respectfully decined mobility this date. Pt stated that she does not need assistance.     Balance Overall balance assessment: Needs assistance Sitting-balance support: No upper extremity supported, Feet supported Sitting balance-Leahy Scale: Fair Sitting balance - Comments: Pt able to maintain sitting balance in recliner without posterior support.                                   ADL either performed or assessed with clinical judgement   ADL Overall ADL's : Needs assistance/impaired                     Lower Body Dressing: Supervision/safety Lower Body Dressing Details (indicate cue type and reason): Pt provided with supervision for LB dressing using figure four strategy.       Toileting - Clothing Manipulation Details (indicate cue type and reason): Pt stated she did not need assistance for toileting tasks. Not observed this session.            Extremity/Trunk Assessment Upper Extremity Assessment Upper Extremity Assessment: Overall WFL for tasks assessed            Vision       Perception     Praxis     Communication Communication Communication: No apparent difficulties   Cognition Arousal: Alert Behavior During Therapy: WFL for tasks assessed/performed Cognition: Cognition impaired   Orientation impairments: Situation Awareness: Intellectual awareness impaired, Online awareness impaired Memory impairment (select all impairments): Working civil service fast streamer, Short-term  memory Attention impairment (select first level of impairment): Selective attention Executive functioning impairment (select all impairments): Problem solving, Reasoning OT - Cognition Comments: Pt with short term memory impairments this session, Pt told that discharge orders by DO have been placed and  she will leave today. Less than 3 minutes after that, Pt stated she wished she could go home today. Pt provided with redirection and reorientation to situation. Pt responded to questions with information regarding different scenario.                 Following commands: Impaired Following commands impaired: Follows one step commands with increased time      Cueing   Cueing Techniques: Verbal cues, Visual cues  Exercises      Shoulder Instructions       General Comments Pt educated on strategies to reduce dizziness with mobility. Pt verbalized understanding and with each example was able to provide a way to implement strategy at home. Though Pt with decreased short term memory requiring continued reinforcement of strategies. Pt required redirection to tasks throughout session. Pt stated she thinks she may be leaving hospital today. OT reached out to Mliss Foot, MD and Schuyler Novak, DO. Schuyler Novak confirmed that discahrge orders were in and Pt would be leaving. Pt informed.    Pertinent Vitals/ Pain       Pain Assessment Pain Assessment: No/denies pain Pain Intervention(s): Monitored during session  Home Living                                          Prior Functioning/Environment              Frequency  Min 2X/week        Progress Toward Goals  OT Goals(current goals can now be found in the care plan section)  Progress towards OT goals: Progressing toward goals  Acute Rehab OT Goals Patient Stated Goal: to go home OT Goal Formulation: With patient Time For Goal Achievement: 03/28/24 Potential to Achieve Goals: Good ADL Goals Pt Will Perform Grooming: with supervision;with contact guard assist;standing Pt Will Perform Lower Body Dressing: with contact guard assist;sit to/from stand Pt Will Transfer to Toilet: ambulating;with contact guard assist Additional ADL Goal #1: pt will be mod I for OOB ADL Additional ADL Goal #2: pt will perform  pillbox test mod I Additional ADL Goal #3: pt will identify and implement strategies to reduce dizziness while performing mobility (i.e. gaze stabilization) with min cues.  Plan      Co-evaluation                 AM-PAC OT 6 Clicks Daily Activity     Outcome Measure   Help from another person eating meals?: None Help from another person taking care of personal grooming?: A Little Help from another person toileting, which includes using toliet, bedpan, or urinal?: A Little Help from another person bathing (including washing, rinsing, drying)?: A Little Help from another person to put on and taking off regular upper body clothing?: A Little Help from another person to put on and taking off regular lower body clothing?: A Little 6 Click Score: 19    End of Session    OT Visit Diagnosis: Unsteadiness on feet (R26.81);History of falling (Z91.81);Muscle weakness (generalized) (M62.81);Other symptoms and signs involving cognitive function   Activity Tolerance Patient tolerated treatment well   Patient Left in  chair;with call bell/phone within reach;with chair alarm set   Nurse Communication Other (comment) (Reached out to primary nurse regardnig d/c.)        Time: 1401-1415 OT Time Calculation (min): 14 min  Charges: OT General Charges $OT Visit: 1 Visit OT Treatments $Self Care/Home Management : 8-22 mins  Maurilio CROME, OTR/L.  [facility] Acute Rehabilitation  Office: [phone]   Maurilio PARAS Katarzyna Wolven 03/26/2024, 2:28 PM

## 2024-03-26 NOTE — Progress Notes (Signed)
 Discharge instructions given to pt and pt boyfriend. Both verbalized understanding of all teaching and had no further questions. Discharge med picked up from pharmacy and given to pt at discharge (creon  12000-38000 cap, simethicone  drops 20mg /0.35mL, potassium chloride  tab,fluconazole  mg tab,acetaminophen  325mg  tab,and certavite/antioxidants tabs)

## 2024-03-27 ENCOUNTER — Encounter (HOSPITAL_COMMUNITY): Payer: Self-pay | Admitting: Internal Medicine

## 2024-03-28 ENCOUNTER — Telehealth: Payer: Self-pay

## 2024-03-28 NOTE — Telephone Encounter (Signed)
 Copied from CRM #[id number]. Topic: Clinical - Home Health Verbal Orders >> Mar 28, 2024  9:45 AM Marda MATSU wrote: Caller/Agency: Debbie with [facility]  Callback Number: [phone] Service Requested: Physical Therapy Frequency:  Any new concerns about the patient? Yes Declining therapy today and would like to resume Tuesday Dec 2

## 2024-04-12 ENCOUNTER — Ambulatory Visit (HOSPITAL_COMMUNITY)
Admission: RE | Admit: 2024-04-12 | Discharge: 2024-04-12 | Disposition: A | Discharge status: Home / Self Care | Source: Ambulatory Visit | Attending: Internal Medicine | Admitting: Internal Medicine

## 2024-04-12 VITALS — BP 111/72 | HR 75 | Temp 97.2°F | Resp 15

## 2024-04-12 DIAGNOSIS — D631 Anemia in chronic kidney disease: Secondary | ICD-10-CM | POA: Diagnosis present

## 2024-04-12 DIAGNOSIS — N183 Chronic kidney disease, stage 3 unspecified: Secondary | ICD-10-CM | POA: Insufficient documentation

## 2024-04-12 LAB — POCT HEMOGLOBIN-HEMACUE: Hemoglobin: 10.4 g/dL — ABNORMAL LOW (ref 12.0–15.0)

## 2024-04-12 MED ORDER — EPOETIN ALFA-EPBX 20000 UNIT/ML IJ SOLN
INTRAMUSCULAR | Status: AC
  Filled 2024-04-12: qty 1

## 2024-04-12 MED ADMIN — Epoetin Alfa-epbx Inj 20000 Unit/ML: 20000 [IU] | SUBCUTANEOUS | NDC 00069131101

## 2024-04-17 ENCOUNTER — Inpatient Hospital Stay (HOSPITAL_COMMUNITY): Admit: 2024-04-17

## 2024-04-27 DIAGNOSIS — E43 Unspecified severe protein-calorie malnutrition: Secondary | ICD-10-CM | POA: Diagnosis not present

## 2024-04-27 DIAGNOSIS — B3781 Candidal esophagitis: Secondary | ICD-10-CM | POA: Diagnosis not present

## 2024-04-27 DIAGNOSIS — Z5982 Transportation insecurity: Secondary | ICD-10-CM | POA: Diagnosis not present

## 2024-04-27 DIAGNOSIS — K219 Gastro-esophageal reflux disease without esophagitis: Secondary | ICD-10-CM | POA: Diagnosis not present

## 2024-04-27 DIAGNOSIS — R627 Adult failure to thrive: Secondary | ICD-10-CM | POA: Diagnosis not present

## 2024-04-27 DIAGNOSIS — N179 Acute kidney failure, unspecified: Secondary | ICD-10-CM | POA: Diagnosis not present

## 2024-04-27 DIAGNOSIS — N184 Chronic kidney disease, stage 4 (severe): Secondary | ICD-10-CM | POA: Diagnosis not present

## 2024-04-27 DIAGNOSIS — I129 Hypertensive chronic kidney disease with stage 1 through stage 4 chronic kidney disease, or unspecified chronic kidney disease: Secondary | ICD-10-CM | POA: Diagnosis not present

## 2024-04-27 DIAGNOSIS — K8689 Other specified diseases of pancreas: Secondary | ICD-10-CM | POA: Diagnosis not present

## 2024-04-27 DIAGNOSIS — F32A Depression, unspecified: Secondary | ICD-10-CM | POA: Diagnosis not present

## 2024-04-27 DIAGNOSIS — Z556 Problems related to health literacy: Secondary | ICD-10-CM | POA: Diagnosis not present

## 2024-04-27 DIAGNOSIS — K529 Noninfective gastroenteritis and colitis, unspecified: Secondary | ICD-10-CM | POA: Diagnosis not present

## 2024-05-03 ENCOUNTER — Encounter (HOSPITAL_COMMUNITY): Payer: Self-pay | Admitting: Internal Medicine

## 2024-05-10 ENCOUNTER — Encounter (HOSPITAL_COMMUNITY): Payer: Self-pay

## 2024-05-10 ENCOUNTER — Inpatient Hospital Stay (HOSPITAL_COMMUNITY): Admission: RE | Admit: 2024-05-10 | Source: Ambulatory Visit

## 2024-05-17 ENCOUNTER — Ambulatory Visit (HOSPITAL_COMMUNITY)
Admission: RE | Admit: 2024-05-17 | Discharge: 2024-05-17 | Disposition: A | Discharge status: Home / Self Care | Source: Ambulatory Visit | Attending: Internal Medicine | Admitting: Internal Medicine

## 2024-05-17 VITALS — BP 103/74 | HR 62 | Temp 97.3°F | Resp 15

## 2024-05-17 DIAGNOSIS — N183 Chronic kidney disease, stage 3 unspecified: Secondary | ICD-10-CM | POA: Diagnosis present

## 2024-05-17 DIAGNOSIS — D631 Anemia in chronic kidney disease: Secondary | ICD-10-CM | POA: Diagnosis present

## 2024-05-17 LAB — RENAL FUNCTION PANEL
Albumin: 3.8 g/dL (ref 3.5–5.0)
Anion gap: 13 (ref 5–15)
BUN: 15 mg/dL (ref 8–23)
CO2: 15 mmol/L — ABNORMAL LOW (ref 22–32)
Calcium: 8.7 mg/dL — ABNORMAL LOW (ref 8.9–10.3)
Chloride: 111 mmol/L (ref 98–111)
Creatinine, Ser: 2.15 mg/dL — ABNORMAL HIGH (ref 0.44–1.00)
GFR, Estimated: 24 mL/min — ABNORMAL LOW
Glucose, Bld: 79 mg/dL (ref 70–99)
Phosphorus: 2.9 mg/dL (ref 2.5–4.6)
Potassium: 3.6 mmol/L (ref 3.5–5.1)
Sodium: 139 mmol/L (ref 135–145)

## 2024-05-17 MED ORDER — EPOETIN ALFA-EPBX 20000 UNIT/ML IJ SOLN
INTRAMUSCULAR | Status: AC
  Filled 2024-05-17: qty 1

## 2024-05-17 MED ORDER — EPOETIN ALFA-EPBX 20000 UNIT/ML IJ SOLN
20000.0000 [IU] | Freq: Once | INTRAMUSCULAR | Status: AC
  Administered 2024-05-17: 20000 [IU] via SUBCUTANEOUS

## 2024-05-18 LAB — PTH, INTACT AND CALCIUM
Calcium, Total (PTH): 9.1 mg/dL (ref 8.7–10.3)
PTH: 43 pg/mL (ref 15–65)

## 2024-05-18 LAB — POCT HEMOGLOBIN-HEMACUE
Hemoglobin: 11.7 g/dL — ABNORMAL LOW (ref 12.0–15.0)
Hemoglobin: 12.7 g/dL (ref 12.0–15.0)

## 2024-05-31 ENCOUNTER — Telehealth (HOSPITAL_COMMUNITY): Payer: Self-pay | Admitting: Pharmacy Technician

## 2024-05-31 ENCOUNTER — Other Ambulatory Visit (HOSPITAL_COMMUNITY): Payer: Self-pay | Admitting: Internal Medicine

## 2024-05-31 DIAGNOSIS — D631 Anemia in chronic kidney disease: Secondary | ICD-10-CM | POA: Insufficient documentation

## 2024-05-31 NOTE — Telephone Encounter (Signed)
 Auth Submission: NO AUTH NEEDED Site of care: [facility] Payer: [employer] DUAL MEDICARE Medication & CPT/J Code(s) submitted: Q5106 - [facility] INJ RETACRIT  NON-ESRD USE Diagnosis Code: N18.30, D63.1 Route of submission (phone, fax, portal):  Phone # Fax # Auth type: Buy/Bill HB Units/visits requested: 10000 UNITS Q 4 WEEKS Reference number: [id number] Approval from: 05/31/24 to 10/01/24   Dagoberto Armour, CPhT [facility] Phone: [phone] 05/31/2024

## 2024-05-31 NOTE — Progress Notes (Signed)
 Received Retacrit  orders. This is CHANGE order  Dx: D63.1/N18.4 Dose: Retacrit  10000 units subcut every 4 weeks. Last dose: 05/17/2024 Next dose: 06/14/2024. Appointment does not need to be changed.  Labs: Hemoglobin via Hemocue at every visit; iron panel monthly; ferritin monthly  BP Paramaters: check BP prior to ESA injections. If SBP > 180 and/or DBP>110: - Hold injection, fax copy of BP to [facility] at [phone] - Clonidine 0.1mg  p.o. x 1 if BP parameters after 30 minutes, give ESA injection. If BP is not within parameters, then notify MD.  Mandatory: fax labs monthly and with every dose change to MD office   Sherry Pennant, PharmD, MPH, BCPS, CPP Clinical Pharmacist

## 2024-06-07 ENCOUNTER — Encounter (HOSPITAL_COMMUNITY)

## 2024-06-14 ENCOUNTER — Encounter (HOSPITAL_COMMUNITY)
